# Patient Record
Sex: Female | Born: 1945 | Race: Black or African American | Hispanic: No | Marital: Single | Smoking: Former smoker
Health system: Southern US, Community
[De-identification: ages and names within clinical notes are randomized; demographics above are authoritative.]

## PROBLEM LIST (undated history)

## (undated) DIAGNOSIS — Z9889 Other specified postprocedural states: Secondary | ICD-10-CM

## (undated) DIAGNOSIS — I509 Heart failure, unspecified: Secondary | ICD-10-CM

## (undated) DIAGNOSIS — B1920 Unspecified viral hepatitis C without hepatic coma: Secondary | ICD-10-CM

## (undated) DIAGNOSIS — N189 Chronic kidney disease, unspecified: Secondary | ICD-10-CM

## (undated) DIAGNOSIS — I10 Essential (primary) hypertension: Secondary | ICD-10-CM

## (undated) DIAGNOSIS — E119 Type 2 diabetes mellitus without complications: Secondary | ICD-10-CM

## (undated) DIAGNOSIS — Z9289 Personal history of other medical treatment: Secondary | ICD-10-CM

## (undated) DIAGNOSIS — Z992 Dependence on renal dialysis: Secondary | ICD-10-CM

## (undated) DIAGNOSIS — B20 Human immunodeficiency virus [HIV] disease: Secondary | ICD-10-CM

## (undated) DIAGNOSIS — B59 Pneumocystosis: Secondary | ICD-10-CM

## (undated) DIAGNOSIS — Z8619 Personal history of other infectious and parasitic diseases: Secondary | ICD-10-CM

## (undated) DIAGNOSIS — R112 Nausea with vomiting, unspecified: Secondary | ICD-10-CM

## (undated) DIAGNOSIS — Z973 Presence of spectacles and contact lenses: Secondary | ICD-10-CM

## (undated) DIAGNOSIS — E7800 Pure hypercholesterolemia, unspecified: Secondary | ICD-10-CM

## (undated) DIAGNOSIS — K219 Gastro-esophageal reflux disease without esophagitis: Secondary | ICD-10-CM

## (undated) DIAGNOSIS — H269 Unspecified cataract: Secondary | ICD-10-CM

## (undated) HISTORY — PX: ABDOMINAL HYSTERECTOMY: SHX81

## (undated) HISTORY — DX: Chronic kidney disease, unspecified: N18.9

## (undated) HISTORY — DX: Essential (primary) hypertension: I10

## (undated) HISTORY — DX: Human immunodeficiency virus (HIV) disease: B20

## (undated) HISTORY — PX: CHOLECYSTECTOMY OPEN: SUR202

## (undated) HISTORY — DX: Personal history of other infectious and parasitic diseases: Z86.19

## (undated) HISTORY — DX: Unspecified viral hepatitis C without hepatic coma: B19.20

---

## 1993-12-04 DIAGNOSIS — E119 Type 2 diabetes mellitus without complications: Secondary | ICD-10-CM

## 1996-12-04 DIAGNOSIS — B20 Human immunodeficiency virus [HIV] disease: Secondary | ICD-10-CM

## 1996-12-04 DIAGNOSIS — B59 Pneumocystosis: Secondary | ICD-10-CM

## 1996-12-04 DIAGNOSIS — Z21 Asymptomatic human immunodeficiency virus [HIV] infection status: Secondary | ICD-10-CM

## 1996-12-04 HISTORY — DX: Asymptomatic human immunodeficiency virus (hiv) infection status: Z21

## 1996-12-04 HISTORY — DX: Human immunodeficiency virus (HIV) disease: B20

## 1996-12-04 HISTORY — DX: Pneumocystosis: B59

## 2007-10-26 ENCOUNTER — Inpatient Hospital Stay (HOSPITAL_COMMUNITY): Admission: EM | Admit: 2007-10-26 | Discharge: 2007-11-08 | Payer: Self-pay | Admitting: Emergency Medicine

## 2007-10-26 ENCOUNTER — Ambulatory Visit: Payer: Self-pay | Admitting: Internal Medicine

## 2007-10-28 ENCOUNTER — Encounter (INDEPENDENT_AMBULATORY_CARE_PROVIDER_SITE_OTHER): Payer: Self-pay | Admitting: Internal Medicine

## 2007-11-03 ENCOUNTER — Ambulatory Visit: Payer: Self-pay | Admitting: Vascular Surgery

## 2007-11-04 ENCOUNTER — Encounter: Payer: Self-pay | Admitting: Vascular Surgery

## 2007-11-12 ENCOUNTER — Ambulatory Visit: Payer: Self-pay | Admitting: Infectious Diseases

## 2007-11-12 ENCOUNTER — Encounter: Admission: RE | Admit: 2007-11-12 | Discharge: 2007-11-12 | Payer: Self-pay | Admitting: Infectious Diseases

## 2007-11-12 DIAGNOSIS — B1710 Acute hepatitis C without hepatic coma: Secondary | ICD-10-CM

## 2007-11-12 DIAGNOSIS — B20 Human immunodeficiency virus [HIV] disease: Secondary | ICD-10-CM

## 2007-11-12 DIAGNOSIS — Z940 Kidney transplant status: Secondary | ICD-10-CM

## 2007-11-12 DIAGNOSIS — I10 Essential (primary) hypertension: Secondary | ICD-10-CM

## 2007-11-12 LAB — CONVERTED CEMR LAB
ALT: 31 units/L (ref 0–35)
Alkaline Phosphatase: 151 units/L — ABNORMAL HIGH (ref 39–117)
Basophils Absolute: 0 10*3/uL (ref 0.0–0.1)
Basophils Relative: 1 % (ref 0–1)
Eosinophils Absolute: 0.5 10*3/uL (ref 0.2–0.7)
Eosinophils Relative: 6 % — ABNORMAL HIGH (ref 0–5)
HCT: 36.5 % (ref 36.0–46.0)
HCV Ab: POSITIVE — AB
HCV Quantitative: 3700000 intl units/mL — ABNORMAL HIGH (ref <5)
HIV 1 RNA Quant: <50 copies/mL (ref <50)
Hep B S Ab: POSITIVE — AB
MCHC: 31.5 g/dL (ref 30.0–36.0)
MCV: 94.3 fL (ref 78.0–100.0)
Platelets: 197 10*3/uL (ref 150–400)
RDW: 21.1 % — ABNORMAL HIGH (ref 11.5–15.5)
RPR Ser Ql: REACTIVE — AB
RPR Titer: 1:4 {titer}
Sodium: 143 meq/L (ref 135–145)
Total Bilirubin: 0.4 mg/dL (ref 0.3–1.2)
Total Protein: 7.1 g/dL (ref 6.0–8.3)

## 2007-11-14 ENCOUNTER — Telehealth (INDEPENDENT_AMBULATORY_CARE_PROVIDER_SITE_OTHER): Payer: Self-pay | Admitting: *Deleted

## 2007-11-18 ENCOUNTER — Ambulatory Visit: Payer: Self-pay | Admitting: Surgery

## 2007-11-20 ENCOUNTER — Ambulatory Visit: Payer: Self-pay | Admitting: Internal Medicine

## 2007-11-20 LAB — CONVERTED CEMR LAB: Blood Glucose, Home Monitor: 3 mg/dL

## 2007-12-06 ENCOUNTER — Ambulatory Visit: Payer: Self-pay | Admitting: Surgery

## 2007-12-06 ENCOUNTER — Ambulatory Visit (HOSPITAL_COMMUNITY): Admission: RE | Admit: 2007-12-06 | Discharge: 2007-12-06 | Payer: Self-pay | Admitting: Surgery

## 2007-12-20 ENCOUNTER — Ambulatory Visit (HOSPITAL_COMMUNITY): Admission: RE | Admit: 2007-12-20 | Discharge: 2007-12-20 | Payer: Self-pay | Admitting: Surgery

## 2008-01-17 ENCOUNTER — Encounter: Payer: Self-pay | Admitting: Infectious Diseases

## 2008-02-05 ENCOUNTER — Ambulatory Visit (HOSPITAL_COMMUNITY): Admission: RE | Admit: 2008-02-05 | Discharge: 2008-02-05 | Payer: Self-pay | Admitting: Vascular Surgery

## 2008-02-13 ENCOUNTER — Ambulatory Visit (HOSPITAL_COMMUNITY): Admission: RE | Admit: 2008-02-13 | Discharge: 2008-02-13 | Payer: Self-pay | Admitting: Nephrology

## 2008-03-16 ENCOUNTER — Ambulatory Visit: Payer: Self-pay | Admitting: Surgery

## 2008-04-01 ENCOUNTER — Ambulatory Visit: Payer: Self-pay | Admitting: Surgery

## 2008-04-24 ENCOUNTER — Ambulatory Visit: Payer: Self-pay | Admitting: Surgery

## 2008-04-25 ENCOUNTER — Ambulatory Visit (HOSPITAL_COMMUNITY): Admission: RE | Admit: 2008-04-25 | Discharge: 2008-04-25 | Payer: Self-pay | Admitting: Surgery

## 2008-05-06 ENCOUNTER — Encounter: Admission: RE | Admit: 2008-05-06 | Discharge: 2008-05-06 | Payer: Self-pay | Admitting: Infectious Diseases

## 2008-05-06 ENCOUNTER — Ambulatory Visit: Payer: Self-pay | Admitting: Infectious Diseases

## 2008-05-06 DIAGNOSIS — A539 Syphilis, unspecified: Secondary | ICD-10-CM | POA: Insufficient documentation

## 2008-05-06 LAB — CONVERTED CEMR LAB
AST: 59 units/L — ABNORMAL HIGH (ref 0–37)
Albumin: 4.2 g/dL (ref 3.5–5.2)
BUN: 36 mg/dL — ABNORMAL HIGH (ref 6–23)
Basophils Relative: 1 % (ref 0–1)
CO2: 30 meq/L (ref 19–32)
Calcium: 10.3 mg/dL (ref 8.4–10.5)
Chloride: 95 meq/L — ABNORMAL LOW (ref 96–112)
Cholesterol: 181 mg/dL (ref 0–200)
Creatinine, Ser: 8.11 mg/dL — ABNORMAL HIGH (ref 0.40–1.20)
Glucose, Bld: 60 mg/dL — ABNORMAL LOW (ref 70–99)
HDL: 70 mg/dL (ref >39)
HIV 1 RNA Quant: <50 copies/mL (ref <50)
HIV-1 RNA Quant, Log: <1.7 (ref <1.70)
Hemoglobin: 11.9 g/dL — ABNORMAL LOW (ref 12.0–15.0)
Lymphs Abs: 1.9 10*3/uL (ref 0.7–4.0)
MCHC: 32.3 g/dL (ref 30.0–36.0)
Monocytes Absolute: 0.8 10*3/uL (ref 0.1–1.0)
Monocytes Relative: 11 % (ref 3–12)
Neutro Abs: 4.5 10*3/uL (ref 1.7–7.7)
Potassium: 4.8 meq/L (ref 3.5–5.3)
RBC: 3.66 M/uL — ABNORMAL LOW (ref 3.87–5.11)
Total CHOL/HDL Ratio: 2.6
Triglycerides: 123 mg/dL (ref <150)
WBC: 7.6 10*3/uL (ref 4.0–10.5)

## 2008-05-08 ENCOUNTER — Ambulatory Visit: Payer: Self-pay | Admitting: Infectious Diseases

## 2008-05-08 ENCOUNTER — Encounter (INDEPENDENT_AMBULATORY_CARE_PROVIDER_SITE_OTHER): Payer: Self-pay | Admitting: *Deleted

## 2008-05-08 ENCOUNTER — Encounter: Payer: Self-pay | Admitting: Infectious Diseases

## 2008-05-15 ENCOUNTER — Ambulatory Visit (HOSPITAL_COMMUNITY): Admission: RE | Admit: 2008-05-15 | Discharge: 2008-05-15 | Payer: Self-pay | Admitting: Infectious Diseases

## 2008-05-18 ENCOUNTER — Encounter: Payer: Self-pay | Admitting: Infectious Diseases

## 2008-06-16 ENCOUNTER — Ambulatory Visit: Payer: Self-pay | Admitting: Infectious Diseases

## 2008-07-20 ENCOUNTER — Ambulatory Visit (HOSPITAL_COMMUNITY): Admission: RE | Admit: 2008-07-20 | Discharge: 2008-07-20 | Payer: Self-pay | Admitting: Nephrology

## 2008-08-28 ENCOUNTER — Ambulatory Visit: Payer: Self-pay | Admitting: Infectious Diseases

## 2008-08-28 LAB — CONVERTED CEMR LAB
ALT: 56 units/L — ABNORMAL HIGH (ref 0–35)
Basophils Absolute: 0.1 10*3/uL (ref 0.0–0.1)
CO2: 27 meq/L (ref 19–32)
Calcium: 10.3 mg/dL (ref 8.4–10.5)
Chloride: 94 meq/L — ABNORMAL LOW (ref 96–112)
Cholesterol: 182 mg/dL (ref 0–200)
Creatinine, Ser: 7.31 mg/dL — ABNORMAL HIGH (ref 0.40–1.20)
Eosinophils Relative: 5 % (ref 0–5)
Glucose, Bld: 95 mg/dL (ref 70–99)
HCT: 45.5 % (ref 36.0–46.0)
HIV-1 RNA Quant, Log: <1.7 (ref <1.70)
Hemoglobin: 14.6 g/dL (ref 12.0–15.0)
Lymphocytes Relative: 28 % (ref 12–46)
Lymphs Abs: 1.9 10*3/uL (ref 0.7–4.0)
Monocytes Absolute: 0.7 10*3/uL (ref 0.1–1.0)
Neutro Abs: 3.9 10*3/uL (ref 1.7–7.7)
RBC: 4.51 M/uL (ref 3.87–5.11)
Total Bilirubin: 0.8 mg/dL (ref 0.3–1.2)
Total Protein: 8.1 g/dL (ref 6.0–8.3)
Triglycerides: 97 mg/dL (ref <150)
VLDL: 19 mg/dL (ref 0–40)
WBC: 6.9 10*3/uL (ref 4.0–10.5)

## 2008-09-15 ENCOUNTER — Ambulatory Visit: Payer: Self-pay | Admitting: Infectious Diseases

## 2008-09-15 LAB — CONVERTED CEMR LAB: RPR Ser Ql: REACTIVE — AB

## 2008-09-28 ENCOUNTER — Encounter: Payer: Self-pay | Admitting: Infectious Diseases

## 2008-12-16 ENCOUNTER — Ambulatory Visit (HOSPITAL_COMMUNITY): Admission: RE | Admit: 2008-12-16 | Discharge: 2008-12-16 | Payer: Self-pay | Admitting: Nephrology

## 2008-12-22 ENCOUNTER — Encounter: Payer: Self-pay | Admitting: Infectious Diseases

## 2008-12-29 ENCOUNTER — Ambulatory Visit: Payer: Self-pay | Admitting: Infectious Diseases

## 2008-12-29 LAB — CONVERTED CEMR LAB
ALT: 25 units/L (ref 0–35)
Albumin: 3.7 g/dL (ref 3.5–5.2)
Basophils Absolute: 0 10*3/uL (ref 0.0–0.1)
CO2: 23 meq/L (ref 19–32)
Calcium: 9.9 mg/dL (ref 8.4–10.5)
Chlamydia, Swab/Urine, PCR: NEGATIVE
Chloride: 99 meq/L (ref 96–112)
GC Probe Amp, Urine: NEGATIVE
Glucose, Bld: 104 mg/dL — ABNORMAL HIGH (ref 70–99)
HIV 1 RNA Quant: 88 copies/mL — ABNORMAL HIGH (ref <48)
Lymphocytes Relative: 20 % (ref 12–46)
Lymphs Abs: 1.6 10*3/uL (ref 0.7–4.0)
Neutro Abs: 5 10*3/uL (ref 1.7–7.7)
Neutrophils Relative %: 62 % (ref 43–77)
Platelets: 235 10*3/uL (ref 150–400)
Potassium: 5.2 meq/L (ref 3.5–5.3)
RDW: 14 % (ref 11.5–15.5)
Sodium: 140 meq/L (ref 135–145)
Total Bilirubin: 0.5 mg/dL (ref 0.3–1.2)
Total Protein: 7.4 g/dL (ref 6.0–8.3)
WBC: 8.1 10*3/uL (ref 4.0–10.5)

## 2008-12-30 ENCOUNTER — Ambulatory Visit (HOSPITAL_COMMUNITY): Admission: RE | Admit: 2008-12-30 | Discharge: 2008-12-30 | Payer: Self-pay | Admitting: Nephrology

## 2008-12-30 ENCOUNTER — Encounter (INDEPENDENT_AMBULATORY_CARE_PROVIDER_SITE_OTHER): Payer: Self-pay | Admitting: Diagnostic Radiology

## 2009-01-11 ENCOUNTER — Encounter (INDEPENDENT_AMBULATORY_CARE_PROVIDER_SITE_OTHER): Payer: Self-pay | Admitting: *Deleted

## 2009-01-22 ENCOUNTER — Encounter: Payer: Self-pay | Admitting: Infectious Diseases

## 2009-02-04 ENCOUNTER — Telehealth: Payer: Self-pay

## 2009-03-05 ENCOUNTER — Encounter: Admission: RE | Admit: 2009-03-05 | Discharge: 2009-03-05 | Payer: Self-pay | Admitting: Nephrology

## 2009-03-09 ENCOUNTER — Ambulatory Visit: Payer: Self-pay | Admitting: Infectious Diseases

## 2009-03-09 LAB — CONVERTED CEMR LAB: HIV-1 RNA Quant, Log: <1.68 (ref <1.68)

## 2009-04-12 ENCOUNTER — Ambulatory Visit (HOSPITAL_COMMUNITY): Admission: RE | Admit: 2009-04-12 | Discharge: 2009-04-12 | Payer: Self-pay | Admitting: Nephrology

## 2009-04-26 ENCOUNTER — Ambulatory Visit: Payer: Self-pay | Admitting: Infectious Diseases

## 2009-04-26 LAB — CONVERTED CEMR LAB
ALT: 54 units/L — ABNORMAL HIGH (ref 0–35)
Basophils Absolute: 0 10*3/uL (ref 0.0–0.1)
Basophils Relative: 1 % (ref 0–1)
CO2: 24 meq/L (ref 19–32)
Calcium: 9.8 mg/dL (ref 8.4–10.5)
Chloride: 98 meq/L (ref 96–112)
Creatinine, Ser: 8.35 mg/dL — ABNORMAL HIGH (ref 0.40–1.20)
GFR calc Af Amer: 6 mL/min — ABNORMAL LOW (ref >60)
GFR calc non Af Amer: 5 mL/min — ABNORMAL LOW (ref >60)
Glucose, Bld: 104 mg/dL — ABNORMAL HIGH (ref 70–99)
HIV 1 RNA Quant: <48 copies/mL (ref <48)
MCHC: 34.1 g/dL (ref 30.0–36.0)
Neutro Abs: 3.5 10*3/uL (ref 1.7–7.7)
Neutrophils Relative %: 62 % (ref 43–77)
RBC: 3.78 M/uL — ABNORMAL LOW (ref 3.87–5.11)
RDW: 14.9 % (ref 11.5–15.5)

## 2009-05-11 ENCOUNTER — Ambulatory Visit: Payer: Self-pay | Admitting: Infectious Diseases

## 2009-05-12 ENCOUNTER — Encounter: Payer: Self-pay | Admitting: Infectious Diseases

## 2009-06-09 ENCOUNTER — Ambulatory Visit (HOSPITAL_COMMUNITY): Admission: RE | Admit: 2009-06-09 | Discharge: 2009-06-09 | Payer: Self-pay | Admitting: Infectious Diseases

## 2009-09-27 ENCOUNTER — Emergency Department (HOSPITAL_COMMUNITY): Admission: EM | Admit: 2009-09-27 | Discharge: 2009-09-27 | Payer: Self-pay | Admitting: Emergency Medicine

## 2009-10-05 ENCOUNTER — Ambulatory Visit: Payer: Self-pay | Admitting: Infectious Diseases

## 2009-10-05 LAB — CONVERTED CEMR LAB
AST: 43 units/L — ABNORMAL HIGH (ref 0–37)
Alkaline Phosphatase: 169 units/L — ABNORMAL HIGH (ref 39–117)
BUN: 29 mg/dL — ABNORMAL HIGH (ref 6–23)
Basophils Absolute: 0 10*3/uL (ref 0.0–0.1)
Basophils Relative: 0 % (ref 0–1)
Creatinine, Ser: 5.09 mg/dL — ABNORMAL HIGH (ref 0.40–1.20)
Eosinophils Relative: 4 % (ref 0–5)
HCT: 36.3 % (ref 36.0–46.0)
HIV-1 RNA Quant, Log: <1.68 (ref <1.68)
Hemoglobin: 12 g/dL (ref 12.0–15.0)
Lymphocytes Relative: 17 % (ref 12–46)
MCHC: 33.1 g/dL (ref 30.0–36.0)
Monocytes Absolute: 0.5 10*3/uL (ref 0.1–1.0)
RDW: 15.1 % (ref 11.5–15.5)
Total Bilirubin: 0.6 mg/dL (ref 0.3–1.2)

## 2009-10-19 ENCOUNTER — Ambulatory Visit: Payer: Self-pay | Admitting: Infectious Diseases

## 2009-10-25 ENCOUNTER — Encounter: Payer: Self-pay | Admitting: Infectious Diseases

## 2009-12-17 ENCOUNTER — Encounter (INDEPENDENT_AMBULATORY_CARE_PROVIDER_SITE_OTHER): Payer: Self-pay | Admitting: *Deleted

## 2009-12-20 ENCOUNTER — Ambulatory Visit: Payer: Self-pay | Admitting: Infectious Diseases

## 2009-12-20 LAB — CONVERTED CEMR LAB
Albumin: 4.1 g/dL (ref 3.5–5.2)
Alkaline Phosphatase: 117 units/L (ref 39–117)
BUN: 51 mg/dL — ABNORMAL HIGH (ref 6–23)
Basophils Absolute: 0 10*3/uL (ref 0.0–0.1)
CO2: 35 meq/L — ABNORMAL HIGH (ref 19–32)
Calcium: 10.2 mg/dL (ref 8.4–10.5)
Chloride: 92 meq/L — ABNORMAL LOW (ref 96–112)
Eosinophils Relative: 8 % — ABNORMAL HIGH (ref 0–5)
Glucose, Bld: 85 mg/dL (ref 70–99)
HCT: 28.6 % — ABNORMAL LOW (ref 36.0–46.0)
Hemoglobin: 9.6 g/dL — ABNORMAL LOW (ref 12.0–15.0)
Lymphocytes Relative: 27 % (ref 12–46)
Monocytes Absolute: 0.6 10*3/uL (ref 0.1–1.0)
Monocytes Relative: 9 % (ref 3–12)
Potassium: 4.9 meq/L (ref 3.5–5.3)
RDW: 12.8 % (ref 11.5–15.5)
RPR Ser Ql: REACTIVE — AB
RPR Titer: 1:4 {titer}
T pallidum Antibodies (TP-PA): >70 — ABNORMAL HIGH (ref <1.0)

## 2010-01-04 ENCOUNTER — Ambulatory Visit: Payer: Self-pay | Admitting: Infectious Diseases

## 2010-01-26 ENCOUNTER — Ambulatory Visit (HOSPITAL_COMMUNITY): Admission: RE | Admit: 2010-01-26 | Discharge: 2010-01-26 | Payer: Self-pay | Admitting: Nephrology

## 2010-01-28 ENCOUNTER — Ambulatory Visit (HOSPITAL_COMMUNITY): Admission: RE | Admit: 2010-01-28 | Discharge: 2010-01-28 | Payer: Self-pay | Admitting: Nephrology

## 2010-02-10 ENCOUNTER — Ambulatory Visit: Payer: Self-pay | Admitting: Vascular Surgery

## 2010-02-10 ENCOUNTER — Ambulatory Visit (HOSPITAL_COMMUNITY): Admission: RE | Admit: 2010-02-10 | Discharge: 2010-02-10 | Payer: Self-pay | Admitting: Vascular Surgery

## 2010-02-14 ENCOUNTER — Ambulatory Visit: Payer: Self-pay | Admitting: Surgery

## 2010-03-29 ENCOUNTER — Encounter: Payer: Self-pay | Admitting: Infectious Diseases

## 2010-04-04 ENCOUNTER — Ambulatory Visit: Payer: Self-pay | Admitting: Infectious Diseases

## 2010-04-04 LAB — CONVERTED CEMR LAB
ALT: 40 units/L — ABNORMAL HIGH (ref 0–35)
AST: 50 units/L — ABNORMAL HIGH (ref 0–37)
BUN: 76 mg/dL — ABNORMAL HIGH (ref 6–23)
Basophils Relative: 0 % (ref 0–1)
CO2: 23 meq/L (ref 19–32)
Creatinine, Ser: 10.73 mg/dL — ABNORMAL HIGH (ref 0.40–1.20)
Eosinophils Absolute: 0.4 10*3/uL (ref 0.0–0.7)
Eosinophils Relative: 6 % — ABNORMAL HIGH (ref 0–5)
HIV 1 RNA Quant: <48 copies/mL (ref <48)
MCHC: 31.4 g/dL (ref 30.0–36.0)
MCV: 98.2 fL (ref 78.0–100.0)
Neutrophils Relative %: 62 % (ref 43–77)
Platelets: 172 10*3/uL (ref 150–400)
Total Bilirubin: 0.6 mg/dL (ref 0.3–1.2)

## 2010-04-05 ENCOUNTER — Telehealth: Payer: Self-pay | Admitting: Infectious Diseases

## 2010-04-20 ENCOUNTER — Ambulatory Visit: Payer: Self-pay | Admitting: Infectious Diseases

## 2010-06-24 ENCOUNTER — Ambulatory Visit (HOSPITAL_COMMUNITY): Admission: RE | Admit: 2010-06-24 | Discharge: 2010-06-24 | Payer: Self-pay | Admitting: Nephrology

## 2010-07-13 ENCOUNTER — Ambulatory Visit (HOSPITAL_COMMUNITY): Admission: RE | Admit: 2010-07-13 | Discharge: 2010-07-13 | Payer: Self-pay | Admitting: Obstetrics and Gynecology

## 2010-09-05 ENCOUNTER — Ambulatory Visit: Payer: Self-pay | Admitting: Infectious Disease

## 2010-09-05 LAB — CONVERTED CEMR LAB
AST: 36 units/L (ref 0–37)
Albumin: 3.7 g/dL (ref 3.5–5.2)
BUN: 68 mg/dL — ABNORMAL HIGH (ref 6–23)
Basophils Relative: 0 % (ref 0–1)
CO2: 27 meq/L (ref 19–32)
Calcium: 10.1 mg/dL (ref 8.4–10.5)
Chloride: 100 meq/L (ref 96–112)
Creatinine, Ser: 9.05 mg/dL — ABNORMAL HIGH (ref 0.40–1.20)
Glucose, Bld: 99 mg/dL (ref 70–99)
HIV 1 RNA Quant: <20 copies/mL (ref <20)
Hemoglobin: 9.2 g/dL — ABNORMAL LOW (ref 12.0–15.0)
Lymphs Abs: 1.4 10*3/uL (ref 0.7–4.0)
MCHC: 32.6 g/dL (ref 30.0–36.0)
Monocytes Absolute: 0.5 10*3/uL (ref 0.1–1.0)
Monocytes Relative: 9 % (ref 3–12)
Neutro Abs: 3.6 10*3/uL (ref 1.7–7.7)
Neutrophils Relative %: 60 % (ref 43–77)
Potassium: 4.8 meq/L (ref 3.5–5.3)
RBC: 2.93 M/uL — ABNORMAL LOW (ref 3.87–5.11)
RPR Ser Ql: REACTIVE — AB
RPR Titer: 1:4 {titer}
T pallidum Antibodies (TP-PA): >8 — ABNORMAL HIGH (ref <0.90)
WBC: 6 10*3/uL (ref 4.0–10.5)

## 2010-09-21 ENCOUNTER — Ambulatory Visit: Payer: Self-pay | Admitting: Infectious Disease

## 2010-09-21 DIAGNOSIS — Z90710 Acquired absence of both cervix and uterus: Secondary | ICD-10-CM

## 2010-09-21 LAB — CONVERTED CEMR LAB: LDL Goal: 100 mg/dL

## 2011-01-03 NOTE — Progress Notes (Signed)
Summary: critical labs for creatnine/TY  Phone Note From Other Clinic   Caller: Joaquim Lai from [employer] of Call: [facility] lab called with an alert on this patients creatinine of 10.73. Dr. Ola Spurr is aware. The patient is currently on dialysis therapy. Initial call taken by: Jarrett Ables CMA,  Apr 05, 2010 10:07 AM

## 2011-01-03 NOTE — Miscellaneous (Signed)
Summary: RW Update  Clinical Lists Changes  Observations: Added new observation of HIV RISK BEH: Heterosexual contact (12/17/2009 14:51)

## 2011-01-03 NOTE — Assessment & Plan Note (Signed)
Summary: 89MONTH F/U/VS   Primary Provider:  Adrian Prows MD  CC:  3 month check up.  History of Present Illness: Follow up for HIV -  Taking meds without any issue - 100% compliance. Doing great at HD. No complaints   PRIOR Followed up at [facility] for consideration of changing regimen since her CD4 ct is still not responded (thinks it was Dr Ralene Ok)  I reveiwed her records from [facility] and they woudl rec change from D4t to lamivudine.  When I saw in June we decided to change her after she came back from her trip to [place] in August.  She actually never started the lamivudine since she felt like she was too busy.  Going to HD still- Dr Archie Balboa is her nephrologist.    He d/ced lisinopril and amlodpine Following at [facility] for possible renal txp.  Had livier bxp done at [facility] and told there was a" little scarring".   Feeling well overall.  Graft doing ok at HD. No fevers chills NS, CP, sob, abd pain, NVD or other concenrnig symptoms Sugars at HD runnign well   Preventive Screening-Counseling & Management  Alcohol-Tobacco     Alcohol drinks/day: 0     Smoking Status: quit     Year Quit: 2000  Caffeine-Diet-Exercise     Caffeine use/day: sodas     Does Patient Exercise: yes     Type of exercise: walking     Exercise (avg: min/session): 30-60     Times/week: 3  Safety-Violence-Falls     Seat Belt Use: yes   Updated Prior Medication List: ZIAGEN 300 MG TABS (ABACAVIR SULFATE) Take  tablet two times a day SUSTIVA 600 MG TABS (EFAVIRENZ) Take 1 tablet by mouth once a day CRESTOR 20 MG TABS (ROSUVASTATIN CALCIUM) 1 tablet by mouth daily ASPIRIN 81 MG  TBEC (ASPIRIN) one by mouth every day PHOSLO 667 MG  CAPS (CALCIUM ACETATE (PHOS BINDER)) take 3 capsules with meals and take 2 capsules with snacks EPIVIR 10 MG/ML SOLN (LAMIVUDINE) take 2.5 ml  (25 mg) once daily LISINOPRIL 40 MG TABS (LISINOPRIL) one by mouth once daily on non hd days OMEPRAZOLE 20 MG CPDR (OMEPRAZOLE) Take one  capsule by mouth at bedtime.  Current Allergies (reviewed today): No known allergies  Past History:  Past Medical History: Last updated: 06/16/2008 1. HIV - dxed 1998 - was admitted with PCP PNA.   Virus had been undetectable but has been off on meds due to "tired of them" Her last VL was in 9/08 and does not remember but thinks it was undetectable.  CD4 at that time was "low" per pt - 150 or so.   Has tried multiple regimens but has had intolerance issues with N/V so has been Ziagen, Zerit and the Sustiva for several years.  Does tolerate these well but does get tired at times of taking so many pills  2. OIs- PCP in 1998 3. STDs - syphilis 1968.  Treated in past but apparently RPR still positive  4.  ESRD -started HD in Nov 08.  Due to DM. 5. DM - was on lantus but since losing 70 #s now only on prandin with A1C of 5.0 6. HTN -  7. Gallbladder surgery 8.  TAH - for fibroid tumors 9. 3 vaginal deliveries 10. Anemia  11.  Hep C - no prior treatment.  No prior biopsy.    Family History: Last updated: 11/12/2007 Mother heart failure Father cancer, DM  Social History: Last updated:  05/06/2008 Lives alone now but daughter here in [place].  Tob none, drink - none now but prior heavy weekend drinker.  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  No other drugs.  Prior cocaine and marijuana.    Risk Factors: Alcohol Use: 0 (04/20/2010) Caffeine Use: sodas (04/20/2010) Exercise: yes (04/20/2010)  Risk Factors: Smoking Status: quit (04/20/2010)  Review of Systems       11 systems reviewed and negative except per HPI   Vital Signs:  Patient profile:   65 year old female Height:      63 inches (160.02 cm) Weight:      133.0 pounds (60.45 kg) BMI:     23.65 Temp:     97.9 degrees F (36.61 degrees C) oral Pulse rate:   81 / minute BP sitting:   180 / 80  (left arm)  Vitals Entered By: Rocky Morel) (Apr 20, 2010 9:55 AM) CC: 3 month check up Pain  Assessment Patient in pain? no      Nutritional Status BMI of 19 -24 = normal Nutritional Status Detail appetite is great per patient  Have you ever been in a relationship where you felt threatened, hurt or afraid?No   Does patient need assistance? Functional Status Self care Ambulation Normal   Physical Exam  General:  alert and well-developed.   Head:  normocephalic and atraumatic.   Eyes:  vision grossly intact and pupils equal.   Mouth:  fair dentition.   Neck:  supple.   Lungs:  normal respiratory effort, no accessory muscle use, and normal breath sounds.   Heart:  normal rate and regular rhythm.   Abdomen:  soft, non-tender, and normal bowel sounds.   Msk:  normal ROM, no joint tenderness, and no joint swelling.   Extremities:  no cce Neurologic:  alert & oriented X3, cranial nerves II-XII intact, and strength normal in all extremities.   Skin:  no rashes.   Cervical Nodes:  no anterior cervical adenopathy and no posterior cervical adenopathy.   Psych:  Oriented X3 and memory intact for recent and remote.          Medication Adherence: 04/20/2010   Adherence to medications reviewed with patient. Counseling to provide adequate adherence provided   Prevention For Positives: 04/20/2010   Safe sex practices discussed with patient. Condoms offered.                             Impression & Recommendations:  Problem # 1:  HIV INFECTION (ICD-042)  Doing great after change of d4t to lamivudine per [facility].   Repeat VL <48 but CD 4 not really increased despite this change. Records reveiwed from [facility] on sustiva, abacavir and epivir, F/u q 6 months  Diagnostics Reviewed:  HIV: CDC-defined AIDS (09/15/2008)   CD4: 190 (04/04/2010)   WBC: 6.6 (04/04/2010)   Hgb: 12.1 (04/04/2010)   HCT: 38.5 (04/04/2010)   Platelets: 172 (04/04/2010) HIV-1 RNA: <48 copies/mL (04/04/2010)   HBSAg: NEG (11/12/2007)  Orders: Est. Patient Level IV (99214)Future  Orders: T-CD4SP ([facility]) (CD4SP) ... 10/17/2010 T-HIV Viral Load [phone]) ... 10/17/2010 T-CBC w/Diff [id number]) ... 10/17/2010 T-Comprehensive Metabolic Panel ([id number]) ... 10/17/2010 T-RPR (Syphilis) [phone]) ... 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  Stable never treated Follows at [facility] intermittently.   Problem # 5:  PREVENTIVE HEALTH CARE (ICD-V70.0) Up to date 05/08/2008 - no further pap smears needed due to total hysterectomy  Problem # 6:  DM (ICD-250.00) Diet controlled.  Feet examined.  SHe had a corn removed recently but otherwise doing well. Her updated medication list for this problem includes:    Aspirin 81 Mg Tbec (Aspirin) ..... One by mouth every day    Lisinopril 40 Mg Tabs (Lisinopril) ..... One by mouth once daily on non hd days  Labs Reviewed: Creat: 10.73 (04/04/2010)    Reviewed HgBA1c results: 5.0 (05/06/2008)  Medications Added to Medication List This Visit: 1)  Omeprazole 20 Mg Cpdr (Omeprazole) .... Take one capsule by mouth at bedtime.  Patient Instructions: 1)  Follow up 6 months with Brad. 2)  Call for new or concerning symptoms 3)  Be sure to return for lab work one (1) week before your next appointment as scheduled.

## 2011-01-03 NOTE — Miscellaneous (Signed)
  Clinical Lists Changes  Medications: Changed medication from EPIVIR 10 MG/ML SOLN (LAMIVUDINE) half a  teaspoons full (25 mg) daily - first day take one tablespoon (15 ml or 150 mg) to EPIVIR 10 MG/ML SOLN (LAMIVUDINE) take 2.5 ml  (25 mg) once daily - Signed Rx of EPIVIR 10 MG/ML SOLN (LAMIVUDINE) take 2.5 ml  (25 mg) once daily;  #1 x 11;  Signed;  Entered by: Adrian Prows MD;  Authorized by: Adrian Prows MD;  Method used: Electronically to [facility] [phone]*, [address], [place], [place], [place]  [address], Ph: [phone] or [phone], Fax: [phone]    Prescriptions: EPIVIR 10 MG/ML SOLN (LAMIVUDINE) take 2.5 ml  (25 mg) once daily  #1 x 11   Entered and Authorized by:   Adrian Prows MD   Signed by:   Adrian Prows MD on 03/29/2010   Method used:   Electronically to        [facility]. [phone]* (retail)       [address]       [place], [place]  [address]       Ph: [phone] or [phone]       Fax: [phone]   RxID:   [id number]

## 2011-01-03 NOTE — Assessment & Plan Note (Signed)
Summary: 6 MONTH F/U [MKJ]]   Visit Type:  Follow-up Primary Dazha Kempa:  [facility]  CC:  6 month f/u c/o boils kidney doctor placed her on doxcycycline, Lipid Management, and Hypertension Management.  History of Present Illness: 65 year old AA lady with HIV, AIDS, dx in 1990s who has been perfectly suppressed for years now. Her VL was undetectable, cd4 count at 180. She has trouble reconstuting above upper 100s low 200s despite changing around her ARV regimen including trip to [place] for 2nd opinion. It turns out she has been taking ther epivir at half dose she was supposed to be taking 12.5 rather than 25mg  per day. I have instructed her on the correct dose and she will redose with 50mg  x1 then 25mg  daily, continuing her abacavir and sustiva. 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 She does not want to make futher changes to her regimen at present. She has a new corn on her foot that has been bothering her and she asks for recommendation with re to further therapy and interventions. She also requests referral to ob gyn. She has had a hysterectomy and unilatetral oopherectomy it appears. Her BP have been labile at HD, high at times then low post HD. I spent greater than 45 miinutes with this pt including greater than 50% of time face to face counselling the pt.  Hypertension History:      Positive major cardiovascular risk factors include female age 30 years old or older, diabetes, and hypertension.  Negative major cardiovascular risk factors include negative family history for ischemic heart disease and non-tobacco-user status.        Positive history for target organ damage include renal insufficiency.  Further assessment for target organ damage reveals no history of ASHD, stroke/TIA, or peripheral vascular disease.    Lipid  Management History:      Positive NCEP/ATP III risk factors include female age 20 years old or older, early menopause without estrogen hormone replacement, diabetes, and hypertension.  Negative NCEP/ATP III risk factors include HDL cholesterol greater than 60, no family history for ischemic heart disease, non-tobacco-user status, no ASHD (atherosclerotic heart disease), no prior stroke/TIA, no peripheral vascular disease, and no history of aortic aneurysm.    Problems Prior to Update: 1)  Preventive Health Care  (ICD-V70.0) 2)  Screening For Malignant Neoplasm of The Cervix  (ICD-V76.2) 3)  Unspecified Syphilis  (ICD-097.9) 4)  Preventive Health Care  (ICD-V70.0) 5)  End Stage Renal Disease  (ICD-585.6) 6)  Essential Hypertension, Benign  (ICD-401.1) 7)  Dm  (ICD-250.00) 8)  Hepatitis C  (ICD-070.51) 9)  HIV Infection  (ICD-042)  Medications Prior to Update: 1)  Ziagen 300 Mg Tabs (Abacavir Sulfate) .... Take  Tablet Two Times A Day 2)  Sustiva 600 Mg Tabs (Efavirenz) .... Take 1 Tablet By Mouth Once A Day 3)  Crestor 20 Mg Tabs (Rosuvastatin Calcium) .Marland Kitchen.. 1 Tablet By Mouth Daily 4)  Aspirin 81 Mg  Tbec (Aspirin) .... One By Mouth Every Day 5)  Phoslo 667 Mg  Caps (Calcium Acetate (Phos Binder)) .... Take 3 Capsules With Meals and Take 2 Capsules With Snacks 6)  Epivir 10 Mg/ml Soln (Lamivudine) .... Take 2.5 Ml  (25 Mg) Once Daily 7)  Lisinopril 40 Mg Tabs (Lisinopril) .... One By Mouth Once Daily On Non Hd Days 8)  Omeprazole  20 Mg Cpdr (Omeprazole) .... Take One Capsule By Mouth At Bedtime.  Current Medications (verified): 1)  Ziagen 300 Mg Tabs (Abacavir Sulfate) .... Take  Tablet Two Times A Day 2)  Sustiva 600 Mg Tabs (Efavirenz) .... Take 1 Tablet By Mouth Once A Day 3)  Crestor 20 Mg Tabs (Rosuvastatin Calcium) .Marland Kitchen.. 1 Tablet By Mouth Daily 4)  Aspirin 81 Mg  Tbec (Aspirin) .... One By Mouth Every Day 5)  Phoslo 667 Mg  Caps (Calcium Acetate (Phos Binder)) .... Take 4 Capsules  With Meals and Take 2 Capsules With Snacks 6)  Epivir 10 Mg/ml Soln (Lamivudine) .... Take 2.5 Ml  (25 Mg) Once Daily 7)  Lisinopril 40 Mg Tabs (Lisinopril) .... One By Mouth Once Daily On Non Hd Days 8)  Omeprazole 20 Mg Cpdr (Omeprazole) .... Take One Capsule By Mouth At Bedtime. 9)  Doxycycline Hyclate 100 Mg Caps (Doxycycline Hyclate) .Marland Kitchen.. 1 Capsule Two Times A Day  Allergies (verified): No Known Drug Allergies      Current Allergies (reviewed today): No known allergies  Past History:  Past Medical History: Last updated: 06/16/2008 1. HIV - dxed 1998 - was admitted with PCP PNA.   Virus had been undetectable but has been off on meds due to "tired of them" Her last VL was in 9/08 and does not remember but thinks it was undetectable.  CD4 at that time was "low" per pt - 150 or so.   Has tried multiple regimens but has had intolerance issues with N/V so has been Ziagen, Zerit and the Sustiva for several years.  Does tolerate these well but does get tired at times of taking so many pills  2. OIs- PCP in 1998 3. STDs - syphilis 1968.  Treated in past but apparently RPR still positive  4.  ESRD -started HD in Nov 08.  Due to DM. 5. DM - was on lantus but since losing 70 #s now only on prandin with A1C of 5.0 6. HTN -  7. Gallbladder surgery 8.  TAH - for fibroid tumors 9. 3 vaginal deliveries 10. Anemia  11.  Hep C - no prior treatment.  No prior biopsy.    Family History: Last updated: 11/12/2007 Mother heart failure Father cancer, DM  Social History: Last updated: 05/06/2008 Lives alone now but daughter here in [place].  Tob none, drink - none now but prior heavy weekend drinker.  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  No other drugs.  Prior cocaine and marijuana.    Risk Factors: Alcohol Use: 0 (04/20/2010) Caffeine Use: sodas (04/20/2010) Exercise: yes (04/20/2010)  Risk Factors: Smoking Status: quit (04/20/2010)  Review of Systems  The patient  denies anorexia, fever, weight loss, weight gain, vision loss, decreased hearing, hoarseness, chest pain, syncope, dyspnea on exertion, peripheral edema, prolonged cough, headaches, hemoptysis, abdominal pain, melena, hematochezia, severe indigestion/heartburn, hematuria, incontinence, genital sores, muscle weakness, suspicious skin lesions, transient blindness, difficulty walking, depression, unusual weight change, abnormal bleeding, and enlarged lymph nodes.    Vital Signs:  Patient profile:   65 year old female Height:      63 inches (160.02 cm) Weight:      133.50 pounds (60.68 kg) BMI:     23.73 Temp:     98.2 degrees F (36.78 degrees C) oral Pulse rate:   96 / minute BP sitting:   149 / 72  (left arm)  Vitals Entered By: Jarrett Ables CMA (September 21, 2010 9:53 AM)  CC: 6 month f/u c/o boils kidney doctor placed her on doxcycycline, Lipid Management, Hypertension Management Pain Assessment Patient in pain? no      Nutritional Status BMI of 19 -24 = normal Nutritional Status Detail nl  Does patient need assistance? Functional Status Self care Ambulation Normal        Medication Adherence: 09/21/2010   Adherence to medications reviewed with patient. Counseling to provide adequate adherence provided   Prevention For Positives: 09/21/2010   Safe sex practices discussed with patient. Condoms offered.   Education Materials Provided: 09/21/2010 Safe sex practices discussed with patient. Condoms offered.                          Physical Exam  General:  alert and well-developed.   Head:  normocephalic and atraumatic.   Eyes:  vision grossly intact and pupils equal.   Ears:  R ear normal and L ear normal.   Nose:  no external deformity and no nasal discharge.   Mouth:  fair dentition.  pharynx pink and moist and no erythema.   Neck:  supple.   Lungs:  normal respiratory effort, no accessory muscle use, and normal breath sounds.   Heart:  normal rate and regular  rhythm.   Abdomen:  soft, non-tender, and normal bowel sounds.   Neurologic:  alert & oriented X3,d strength normal in all extremities.   Skin:  no rashes.   Psych:  Oriented X3 and memory intact for recent and remote.     Impression & Recommendations:  Problem # 1:  HIV INFECTION (ICD-042) suppressed continue current regimen. With time her cd4 count may drop further with age and in that case would be inclined to put her back on PCP prophylaxis with for example 3 x weekly bactrim Diagnostics Reviewed:  HIV: CDC-defined AIDS (09/15/2008)   CD4: 180 (09/06/2010)   WBC: 6.0 (09/05/2010)   Hgb: 9.2 (09/05/2010)   HCT: 28.2 (09/05/2010)   Platelets: 183 (09/05/2010) HIV-1 RNA: <48 copies/mL (04/04/2010)   HBSAg: NEG (11/12/2007)  Her updated medication list for this problem includes:    Doxycycline Hyclate 100 Mg Caps (Doxycycline hyclate) .Marland Kitchen... 1 capsule two times a day  Orders: T-GC Probe, urine [id number]) Est. Patient Level V (99215)Future Orders: T-CD4SP ([facility]) (CD4SP) ... 01/04/2011 T-HIV Viral Load [id number]) ... 01/04/2011 T-CBC w/Diff [id number]) ... 01/04/2011 T-Lipid Profile [phone]) ... 01/04/2011 T-RPR (Syphilis) [phone]) ... 01/04/2011 T-Comprehensive Metabolic Panel ([id number]) ... 01/04/2011  Problem # 2:  ESSENTIAL HYPERTENSION, BENIGN (ICD-401.1)  decent control see remarks with re to HD Her updated medication list for this problem includes:    Lisinopril 40 Mg Tabs (Lisinopril) ..... One by mouth once daily on non hd days  Orders: Est. Patient Level V [id number])  Problem # 3:  DM (ICD-250.00)  Should check a1c, get opthoexam. Referring to podiatry already. Diet controlled? Her updated medication list for this problem includes:    Aspirin 81 Mg Tbec (Aspirin) ..... One by mouth every day    Lisinopril 40 Mg Tabs (Lisinopril) ..... One by mouth once daily on non hd days  Orders: Est. Patient Level V [id number])  Problem # 4:  HEPATITIS C  (ICD-070.51) Assessment: Unchanged  Does not want referral for treatment as long as involved injectables  Orders: Est. 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 Patient Level V [id number])  Medications Added to Medication List This Visit: 1)  Phoslo 667 Mg Caps (Calcium acetate (phos binder)) .... Take 4 capsules with meals and take 2 capsules with snacks 2)  Doxycycline Hyclate 100 Mg Caps (Doxycycline hyclate) .Marland Kitchen.. 1 capsule two times a day  Other Orders: Gynecologic Referral (Gyn)  Hypertension Assessment/Plan:      The patient's hypertensive risk group is category C: Target organ damage and/or diabetes.  Her calculated 10 year risk of coronary heart disease is 13 %.  Today's blood pressure is 149/72.  Her blood pressure goal is < 130/80.  Lipid Assessment/Plan:      Based on NCEP/ATP III, the patient's risk factor category is "history of diabetes".  The patient's lipid goals are as follows: Total cholesterol goal is 200; LDL cholesterol goal is 100; HDL cholesterol goal is 40; Triglyceride goal is 150.  Her LDL cholesterol goal has been met.    Patient Instructions: 1)  rtc in 4 months 2)  Advised not to eat any food or drink any liquids after 10 PM the night before procedure. 3)  Be sure to return for lab work one (1) week before your next appointment as scheduled.

## 2011-01-03 NOTE — Assessment & Plan Note (Signed)
Summary: 72MONTH F/U/VS   Visit Type:  Follow-up Primary Provider:  Adrian Prows MD  CC:  2 month follow up.  History of Present Illness: Follow up for HIV - last seen nov 2010 - started on lamivudine in place of d4t.  taking it ok.   Taking meds without any issue - 100% compliance. Doing great at HD.   PRIOR Followed up at [facility] ID for consideration of changing regimen since her CD4 ct is still not responded (thinks it was Dr Ralene Ok)  I reveiwed her records from [facility] and they woudl rec change from D4t to lamivudine.  When I saw in June we decided to change her after she came back from her trip to [place] in August.  She actually never started the lamivudine since she felt like she was too busy.  Going to HD still- Dr Archie Balboa is her nephrologist.    He d/ced lisinopril and amlodpine Following at [facility] for possible renal txp.  Had livier bxp done at [facility] and told there was a" little scarring".   Feeling well overall.  Graft doing ok at HD. No fevers chills NS, CP, sob, abd pain, NVD or other concenrnig symptoms Sugars at HD runnign well   Preventive Screening-Counseling & Management  Alcohol-Tobacco     Alcohol drinks/day: 0     Smoking Status: never     Year Quit: 2000  Caffeine-Diet-Exercise     Caffeine use/day: occassionally caffeine     Does Patient Exercise: yes     Type of exercise: walking     Exercise (avg: min/session): 30-60     Times/week: 3  Safety-Violence-Falls     Seat Belt Use: yes   Updated Prior Medication List: FREESTYLE LITE   STRP (GLUCOSE BLOOD) to test blood sugar 3x daily BD ULTRA-FINE 33 LANCETS   MISC (LANCETS) to test blood sugar 3 x daily ZIAGEN 300 MG TABS (ABACAVIR SULFATE) Take  tablet two times a day SUSTIVA 600 MG TABS (EFAVIRENZ) Take 1 tablet by mouth once a day CRESTOR 20 MG TABS (ROSUVASTATIN CALCIUM) 1 tablet by mouth daily ASPIRIN 81 MG  TBEC (ASPIRIN) one by mouth every day PHOSLO 667 MG  CAPS (CALCIUM ACETATE (PHOS BINDER)) take  3 capsules with meals and take 2 capsules with snacks FOSRENOL 1000 MG CHEW (LANTHANUM CARBONATE) one by mouth once daily EPIVIR 10 MG/ML SOLN (LAMIVUDINE) half a  teaspoons full (25 mg) daily - first day take one tablespoon (15 ml or 150 mg) LISINOPRIL 40 MG TABS (LISINOPRIL) one by mouth once daily on non hd days  Current Allergies (reviewed today): No known allergies  Past History:  Past Medical History: Last updated: 06/16/2008 1. HIV - dxed 1998 - was admitted with PCP PNA.   Virus had been undetectable but has been off on meds due to "tired of them" Her last VL was in 9/08 and does not remember but thinks it was undetectable.  CD4 at that time was "low" per pt - 150 or so.   Has tried multiple regimens but has had intolerance issues with N/V so has been Ziagen, Zerit and the Sustiva for several years.  Does tolerate these well but does get tired at times of taking so many pills  2. OIs- PCP in 1998 3. STDs - syphilis 1968.  Treated in past but apparently RPR still positive  4.  ESRD -started HD in Nov 08.  Due to DM. 5. DM - was on lantus but since losing 70 #s now only  on prandin with A1C of 5.0 6. HTN -  7. Gallbladder surgery 8.  TAH - for fibroid tumors 9. 3 vaginal deliveries 10. Anemia  11.  Hep C - no prior treatment.  No prior biopsy.    Family History: Last updated: 11/12/2007 Mother heart failure Father cancer, DM  Social History: Last updated: 05/06/2008 Lives alone now but daughter here in [place].  Tob none, drink - none now but prior heavy weekend drinker.  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  No other drugs.  Prior cocaine and marijuana.    Risk Factors: Alcohol Use: 0 (01/04/2010) Caffeine Use: occassionally caffeine (01/04/2010) Exercise: yes (01/04/2010)  Risk Factors: Smoking Status: never (01/04/2010)  Review of Systems       11 systems reviewed and negative except per HPI   Vital Signs:  Patient profile:   65 year old  female Height:      63 inches (160.02 cm) Weight:      135.3 pounds (61.50 kg) BMI:     24.05 Temp:     97.3 degrees F (36.28 degrees C) oral Pulse rate:   77 / minute BP sitting:   191 / 86  (left arm)  Vitals Entered By: Rocky Morel) (January 04, 2010 9:06 AM) CC: 2 month follow up Is Patient Diabetic? Yes Did you bring your meter with you today? No Pain Assessment Patient in pain? no      Nutritional Status BMI of 19 -24 = normal Nutritional Status Detail appetite is great per patient  Have you ever been in a relationship where you felt threatened, hurt or afraid?No   Does patient need assistance? Functional Status Self care Ambulation Normal   Physical Exam  General:  alert and well-developed.   Head:  normocephalic.   Ears:  R ear normal and L ear normal.   Mouth:  fair dentition.   Neck:  supple.   Lungs:  normal respiratory effort and normal breath sounds.   Heart:  normal rate and regular rhythm.   Abdomen:  soft and non-tender.   Msk:  normal ROM and no joint tenderness.   Extremities:  no cce Neurologic:  alert & oriented X3, cranial nerves II-XII intact, and strength normal in all extremities.   Skin:  r forearm graft wnl Psych:  memory intact for recent and remote.          Medication Adherence: 01/04/2010   Adherence to medications reviewed with patient. Counseling to provide adequate adherence provided   Prevention For Positives: 01/04/2010   Safe sex practices discussed with patient. Condoms offered.                             Impression & Recommendations:  Problem # 1:  HIV INFECTION (ICD-042)  Doing great after change of d4t to lamivudine per [facility] ID- ?Dr Ralene Ok.   Repeat VL <48 and cd4>200 Records reveiwed from [facility]  F/u  month The following medications were removed from the medication list:    Bactrim Ds 800-160 Mg Tabs (Sulfamethoxazole-trimethoprim) ..... One by mouth m w  f  Diagnostics Reviewed:  HIV: CDC-defined  AIDS (09/15/2008)   CD4: 150 (10/06/2009)   WBC: 7.0 (01/14/2009)   Hgb: 12.0 (01/14/2009)   HCT: 36.3 (01/14/2009)   Platelets: 220 (01/14/2009) HIV-1 RNA: <48 copies/mL (01/14/2009)   HBSAg: NEG (11/12/2007)  Diagnostics Reviewed:  HIV: CDC-defined AIDS (09/15/2008)   CD4: 250 (  12/21/2009)   WBC: 6.8 (12/20/2009)   Hgb: 9.6 (12/20/2009)   HCT: 28.6 (12/20/2009)   Platelets: 205 (12/20/2009) HIV-1 RNA: <48 copies/mL (12/20/2009)   HBSAg: NEG (11/12/2007)  Orders: Est. Patient Level IV (99214)Future Orders: T-CD4SP ([facility]) (CD4SP) ... 04/04/2010 T-HIV Viral Load [phone]) ... 04/04/2010 T-CBC w/Diff [id number]) ... 04/04/2010 T-Comprehensive Metabolic Panel ([id number]) ... 04/04/2010  Problem # 2:  UNSPECIFIED SYPHILIS (ICD-097.9) current 1:4 stable serofast state  Problem # 3:  [place] (ICD-V70.0) no paps due to TAH for non malignant fibroids Hep a and b immune.   Declines flu shot.  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  BOP high today but she is due for hd The following medications were removed from the medication list:    Metoprolol Tartrate 50 Mg Tabs (Metoprolol tartrate) .Marland Kitchen... 2 by mouth once daily Her updated medication list for this problem includes:    Lisinopril 40 Mg Tabs (Lisinopril) ..... One by mouth once daily on non hd days  BP today: 191/86 Prior BP: 144/69 (10/19/2009)  Labs  Reviewed: K+: 4.9 (12/20/2009) Creat: : 9.05 (12/20/2009)   Chol: 182 (08/28/2008)   HDL: 88 (08/28/2008)   LDL: 75 (08/28/2008)   TG: 97 (08/28/2008)  Medications Added to Medication List This Visit: 1)  Lisinopril 40 Mg Tabs (Lisinopril) .... One by mouth once daily on non hd days  Patient Instructions: 1)  Please schedule a follow-up appointment in 3 months (May) 2)  Be sure to return for lab work one (1) week before your next appointment as scheduled.  Process Orders Check Orders Results:     [facility]: Check successful Tests Sent for requisitioning (January 04, 2010 9:24 AM):     04/04/2010: [facility] -- T-HIV Viral Load [phone] (signed)     04/04/2010: [facility] -- T-CBC w/Diff [id number] (signed)     04/04/2010: [facility] -- T-Comprehensive Metabolic Panel [id number] (signed)   Prevention & Chronic Care Immunizations   Influenza vaccine: declined   (10/19/2009)    Tetanus booster: Not documented    Pneumococcal vaccine: Pneumovax  (12/29/2008)    H. zoster vaccine: Not documented  Colorectal Screening   Hemoccult: Not documented    Colonoscopy: Not documented  Other Screening   Pap smear: HYSTERECTOMY, 1982, NORMAL  (05/08/2008)    Mammogram: Assessment: BIRADS 1.   (06/09/2009)   Mammogram action/deferral: Screening mammogram in 1 year.     (06/09/2009)   Mammogram due: 05/2009    DXA bone density scan: Not documented   Smoking status: never  (01/04/2010)  Diabetes Mellitus   HgbA1C: 5.0  (05/06/2008)    Eye exam: Not documented    Foot exam: Not documented   High risk foot: Not documented   Foot care education: Not documented    Urine microalbumin/creatinine ratio: Not documented  Lipids   Total Cholesterol: 182  (  08/28/2008)   LDL: 75  (08/28/2008)   LDL Direct: Not documented   HDL: 88  (08/28/2008)   Triglycerides: 97  (08/28/2008)  Hypertension   Last  Blood Pressure: 191 / 86  (01/04/2010)   Serum creatinine: 9.05  (12/20/2009)   Serum potassium 4.9  (12/20/2009) CMP ordered   Self-Management Support :    Diabetes self-management support: Not documented   Last diabetes self-management training by diabetes educator: 11/20/2007    Hypertension self-management support: Not documented

## 2011-01-04 ENCOUNTER — Ambulatory Visit: Admit: 2011-01-04 | Payer: Self-pay | Admitting: Infectious Disease

## 2011-01-04 ENCOUNTER — Encounter (INDEPENDENT_AMBULATORY_CARE_PROVIDER_SITE_OTHER): Payer: Self-pay | Admitting: *Deleted

## 2011-01-09 ENCOUNTER — Encounter: Payer: Self-pay | Admitting: Infectious Disease

## 2011-01-11 ENCOUNTER — Encounter (INDEPENDENT_AMBULATORY_CARE_PROVIDER_SITE_OTHER): Payer: Self-pay | Admitting: *Deleted

## 2011-01-11 NOTE — Miscellaneous (Signed)
  Clinical Lists Changes  Observations: Added new observation of INCOMESOURCE: UNKNOWN (01/04/2011 12:16) Added new observation of HOUSEINCOME: 0  (01/04/2011 12:16) Added new observation of #CHILD<18 IN: Unknown  (01/04/2011 12:16) Added new observation of FAMILYSIZE: 1  (01/04/2011 12:16) Added new observation of HOUSING: Unknown  (01/04/2011 12:16) Added new observation of YEARLYEXPEN: 0  (01/04/2011 12:16) Added new observation of MARITAL STAT: Unknown  (01/04/2011 12:16)

## 2011-01-17 ENCOUNTER — Other Ambulatory Visit (HOSPITAL_COMMUNITY): Payer: Self-pay | Admitting: Nephrology

## 2011-01-17 DIAGNOSIS — N186 End stage renal disease: Secondary | ICD-10-CM

## 2011-01-19 NOTE — Miscellaneous (Signed)
  Clinical Lists Changes 

## 2011-01-26 ENCOUNTER — Telehealth: Payer: Self-pay | Admitting: Infectious Disease

## 2011-01-27 ENCOUNTER — Ambulatory Visit (HOSPITAL_COMMUNITY): Admission: RE | Admit: 2011-01-27 | Payer: Self-pay | Source: Ambulatory Visit

## 2011-01-30 ENCOUNTER — Encounter: Payer: Self-pay | Admitting: Infectious Disease

## 2011-01-30 ENCOUNTER — Other Ambulatory Visit: Payer: Self-pay | Admitting: Infectious Disease

## 2011-01-30 ENCOUNTER — Other Ambulatory Visit (HOSPITAL_COMMUNITY): Payer: Self-pay | Admitting: Nephrology

## 2011-01-30 ENCOUNTER — Ambulatory Visit (HOSPITAL_COMMUNITY)
Admission: RE | Admit: 2011-01-30 | Discharge: 2011-01-30 | Disposition: A | Discharge status: Home / Self Care | Payer: Medicare Other | Source: Ambulatory Visit | Attending: Nephrology | Admitting: Nephrology

## 2011-01-30 ENCOUNTER — Other Ambulatory Visit (INDEPENDENT_AMBULATORY_CARE_PROVIDER_SITE_OTHER): Payer: Medicare Other

## 2011-01-30 DIAGNOSIS — I871 Compression of vein: Secondary | ICD-10-CM | POA: Insufficient documentation

## 2011-01-30 DIAGNOSIS — B20 Human immunodeficiency virus [HIV] disease: Secondary | ICD-10-CM

## 2011-01-30 DIAGNOSIS — N186 End stage renal disease: Secondary | ICD-10-CM | POA: Insufficient documentation

## 2011-01-30 LAB — CONVERTED CEMR LAB
Albumin: 3.9 g/dL (ref 3.5–5.2)
BUN: 58 mg/dL — ABNORMAL HIGH (ref 6–23)
Basophils Absolute: 0 10*3/uL (ref 0.0–0.1)
Calcium: 9.8 mg/dL (ref 8.4–10.5)
Chloride: 97 meq/L (ref 96–112)
Creatinine, Ser: 9.78 mg/dL — ABNORMAL HIGH (ref 0.40–1.20)
Glucose, Bld: 97 mg/dL (ref 70–99)
HDL: 67 mg/dL (ref >39)
HIV-1 RNA Quant, Log: <1.3 (ref <1.30)
Hemoglobin: 11.7 g/dL — ABNORMAL LOW (ref 12.0–15.0)
LDL Cholesterol: 54 mg/dL (ref 0–99)
Lymphocytes Relative: 20 % (ref 12–46)
Lymphs Abs: 1.4 10*3/uL (ref 0.7–4.0)
Monocytes Absolute: 0.6 10*3/uL (ref 0.1–1.0)
Monocytes Relative: 9 % (ref 3–12)
Neutro Abs: 4.5 10*3/uL (ref 1.7–7.7)
Potassium: 5 meq/L (ref 3.5–5.3)
RBC: 4.21 M/uL (ref 3.87–5.11)
RPR Ser Ql: REACTIVE — AB
Total CHOL/HDL Ratio: 2.3
VLDL: 36 mg/dL (ref 0–40)
WBC: 6.9 10*3/uL (ref 4.0–10.5)

## 2011-01-30 MED ORDER — IOHEXOL 300 MG/ML  SOLN: 50.0000 mL | Freq: Once | INTRAMUSCULAR | Status: AC | PRN

## 2011-01-31 LAB — T-HELPER CELL (CD4) - (RCID CLINIC ONLY): CD4 % Helper T Cell: 15 % — ABNORMAL LOW (ref 33–55)

## 2011-02-09 NOTE — Miscellaneous (Signed)
Summary: [facility]   Imported By: Bonner Puna 01/30/2011 10:20:09  _____________________________________________________________________  External Attachment:    Type:   Image     Comment:   External Document

## 2011-02-09 NOTE — Progress Notes (Addendum)
Summary: pATIENT NEEDS FLU SHOT  Phone Note Outgoing Call   Call placed by: Alcide Evener MD,  January 26, 2011 4:01 PM Details for Reason: PT NEEDS FLU SHOT Summary of Call: Patient needs flu shot. Please call and have come in for flu shot and if pt refuses flu shot  please document in chart Initial call taken by: Alcide Evener MD,  January 26, 2011 4:01 PM  Follow-up for Phone Call        patient came in and had labs but did not stay to get flu vaccine. I called the patient to see if she could come back, but I got her voicemail. I did leave a message for her to come back if possible. Follow-up by: Jarrett Ables CMA,  January 30, 2011 10:10 AM        Appended Document: pATIENT NEEDS FLU SHOT !!  I wish we could have at least documented  a refusal--if she truly does not want the flu shot  Appended Document: pATIENT NEEDS FLU SHOT Patient refused over the phone Jarrett Ables, CMA  Appended Document: pATIENT NEEDS FLU SHOT iN PERSON SHE REFUSED AS WELL

## 2011-02-13 ENCOUNTER — Ambulatory Visit (INDEPENDENT_AMBULATORY_CARE_PROVIDER_SITE_OTHER): Payer: Medicare Other | Admitting: Infectious Disease

## 2011-02-13 ENCOUNTER — Encounter: Payer: Self-pay | Admitting: Infectious Disease

## 2011-02-13 DIAGNOSIS — N186 End stage renal disease: Secondary | ICD-10-CM

## 2011-02-13 DIAGNOSIS — Z90710 Acquired absence of both cervix and uterus: Secondary | ICD-10-CM

## 2011-02-13 DIAGNOSIS — I10 Essential (primary) hypertension: Secondary | ICD-10-CM

## 2011-02-13 DIAGNOSIS — B20 Human immunodeficiency virus [HIV] disease: Secondary | ICD-10-CM

## 2011-02-13 DIAGNOSIS — B1710 Acute hepatitis C without hepatic coma: Secondary | ICD-10-CM

## 2011-02-19 LAB — T-HELPER CELL (CD4) - (RCID CLINIC ONLY)
CD4 % Helper T Cell: 14 % — ABNORMAL LOW (ref 33–55)
CD4 T Cell Abs: 250 uL — ABNORMAL LOW (ref 400–2700)

## 2011-02-21 NOTE — Assessment & Plan Note (Signed)
Summary: F/U OV/VS   Vital Signs:  Patient profile:   65 year old female Menstrual status:  postmenopausal Height:      63 inches (160.02 cm) Weight:      136 pounds (61.82 kg) BMI:     24.18 Temp:     97.3 degrees F (36.28 degrees C) oral Pulse rate:   78 / minute BP sitting:   174 / 93  (left arm)  Vitals Entered By: Jarrett Ables CMA (February 13, 2011 9:40 AM) CC: f/u , Hypertension Management Is Patient Diabetic? Yes Did you bring your meter with you today? No Pain Assessment Patient in pain? no      Nutritional Status BMI of 19 -24 = normal Nutritional Status Detail nl  Does patient need assistance? Functional Status Self care Ambulation Normal LMP - Character: Partial hysterectomy due to fibroids, cervix removed,  left ovary still present     Menstrual Status postmenopausal Last PAP Result HYSTERECTOMY, 1982, NORMAL   Visit Type:  Follow-up Primary Provider:  Rhina Brackett Dam  CC:  f/u  and Hypertension Management.  History of Present Illness: 64 year old AA lady with HIV, AIDS, dx in 1990s, with untreated Hepatitis C who has ESRD and is getting HD. She is being evaluated for kidney transplant at [facility] and has appt with  Dr. Ralene Ok in ID first. She cannot recall her transplant ID docs name. We reviewed her labs today. I discussed rx for Hepatitis C for her, which typically involves referral to [facility] who then follow the pt in the same building as [place] on [address] here in [place]. HOwever she was reluctant to be rx when I described some of the toxicities with IFN and ribavirin. She also refused flu vacciantion claiming that a severe illnesss ensued in [place] after such a vaccine. From the history she gave me it sounded unrelated to the flu shot but she adamantly refused vaccination. I spent over 45 minutes with the pt including greater than 50% of time in face to face counselling of the pt and coordination of care.  Hypertension History:      Positive major  cardiovascular risk factors include female age 43 years old or older, diabetes, and hypertension.  Negative major cardiovascular risk factors include negative family history for ischemic heart disease and non-tobacco-user status.        Positive history for target organ damage include renal insufficiency.  Further assessment for target organ damage reveals no history of ASHD, stroke/TIA, or peripheral vascular disease.     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 Take  Tablet Two Times A Day 2)  Sustiva 600 Mg Tabs (Efavirenz) .... Take 1 Tablet By Mouth Once A Day 3)  Crestor 20 Mg Tabs (Rosuvastatin Calcium) .Marland Kitchen.. 1 Tablet By Mouth Daily 4)  Aspirin 81 Mg  Tbec (Aspirin) .... One By Mouth Every Day 5)  Phoslo 667 Mg  Caps (Calcium Acetate (Phos Binder)) .... Take 4  Capsules With Meals and Take 2 Capsules With Snacks 6)  Epivir 10 Mg/ml Soln (Lamivudine) .... Take 2.5 Ml  (25 Mg) Once Daily 7)  Lisinopril 40 Mg Tabs (Lisinopril) .... One By Mouth Once Daily On Non Hd Days 8)  Omeprazole 20 Mg Cpdr (Omeprazole) .... Take One Capsule By Mouth At Bedtime. 9)  Doxycycline Hyclate 100 Mg Caps (Doxycycline Hyclate) .Marland Kitchen.. 1 Capsule Two Times A Day  Current Medications (verified): 1)  Ziagen 300 Mg Tabs (Abacavir Sulfate) .... Take  Tablet Two Times A Day 2)  Sustiva 600 Mg Tabs (Efavirenz) .... Take 1 Tablet By Mouth Once A Day 3)  Crestor 20 Mg Tabs (Rosuvastatin Calcium) .Marland Kitchen.. 1 Tablet By  Mouth Daily 4)  Aspirin 81 Mg  Tbec (Aspirin) .... One By Mouth Every Day 5)  Phoslo 667 Mg  Caps (Calcium Acetate (Phos Binder)) .... Take 4 Capsules With Meals and Take 2 Capsules With Snacks 6)  Epivir 10 Mg/ml Soln (Lamivudine) .... Take 2.5 Ml  (25 Mg) Once Daily 7)  Lisinopril 40 Mg Tabs (Lisinopril) .... One By Mouth Once Daily On Non Hd Days 8)  Omeprazole 20 Mg Cpdr (Omeprazole) .... Take One Capsule By Mouth At Bedtime. 9)  Amlodipine Besylate 5 Mg Tabs (Amlodipine Besylate) .... Take 1 Tablet By Mouth Once A Day  Allergies (verified): No Known Drug Allergies  Past History:  Past Medical History: 1. HIV - dxed 1998 - was admitted with PCP PNA.   3. STDs - syphilis 1968.  Treated in past but apparently RPR still positive  4.  ESRD -started HD in Nov 08.  Dr. Donato Heinz, [place] 5. DM - was on lantus but since losing 70 #s now only on prandin with A1C of 5.0 6. HTN -  7. Gallbladder surgery 8.  TAH - for fibroid tumors 9. 3 vaginal deliveries 10. Anemia  11.  Hep C - no prior treatment.  No prior biopsy.    Additional History Menstrual Status:  postmenopausal  Review of Systems       see HPI otherwise negative on 12 point ros       Medication Adherence: 02/13/2011   Adherence to medications reviewed with patient. Counseling to provide adequate adherence provided                                Physical Exam  General:  alert and well-developed.   Head:  normocephalic and atraumatic.   Eyes:  vision grossly intact and pupils equal.   Ears:  R ear normal and L ear normal.   Nose:  no external deformity and no nasal discharge.   Mouth:  fair dentition.  pharynx pink and moist and no erythema.   Neck:  supple.   Lungs:  normal respiratory effort, no accessory muscle use, and normal breath sounds.   Heart:  normal rate and regular rhythm.   Abdomen:  soft, non-tender, and normal bowel sounds.   Msk:  normal ROM, no joint tenderness, and no joint swelling.     Neurologic:  alert & oriented X3,d strength normal in all extremities.   Skin:  no rashes.   Psych:  Oriented X3 and memory intact for recent and remote.  initially  al little tired appearinb but brightened jup during interview   Impression & Recommendations:  Problem # 1:  HIV INFECTION (ICD-042)  superb control! The following medications were removed from the medication list:  Doxycycline Hyclate 100 Mg Caps (Doxycycline hyclate) .Marland Kitchen... 1 capsule two times a day  Orders: Est. Patient Level V (99215)Future Orders: T-CD4SP ([facility]) (CD4SP) ... 05/14/2011 T-HIV Viral Load [phone]) ... 05/14/2011 T-CBC w/Diff [id number]) ... 05/14/2011 T-Comprehensive Metabolic Panel ([id number]) ... 05/14/2011 T-Lipid Profile [phone]) ... 05/14/2011  Diagnostics Reviewed:  HIV: CDC-defined AIDS (09/15/2008)   CD4: 230 (01/31/2011)   WBC: 6.9 (01/30/2011)   Hgb: 11.7 (01/30/2011)   HCT: 36.9 (01/30/2011)   Platelets: 214 (01/30/2011) HIV-1 RNA: <20 copies/mL (01/30/2011)   HBSAg: NEG (11/12/2007)  Problem # 2:  ESSENTIAL HYPERTENSION, BENIGN (ICD-401.1)  poorly controlled. Added norvasc Her updated medication list for this problem includes:    Lisinopril 40 Mg Tabs (Lisinopril) ..... One by mouth once daily on non hd days    Amlodipine Besylate 5 Mg Tabs (Amlodipine besylate) .Marland Kitchen... Take 1 tablet by mouth once a day  Orders: Est. 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 Patient Level V [id number])  Problem # 4:  HEPATITIS C (ICD-070.51)  never treated. I offered referral for treatment but she wishes to wait for IFN free therapies. She does not have any active depression and I think she could go thru current regimen of PI, IFN and ribavirin but she did not want to unless the [facility] ID or transplant MDs insisted on this  Orders: Est. 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 Patient Level V [id number])  Medications Added to Medication List This Visit: 1)  Amlodipine Besylate 5 Mg Tabs (Amlodipine besylate) .... Take 1 tablet by mouth once a day  Hypertension Assessment/Plan:      The patient's hypertensive risk group is category C: Target organ damage and/or diabetes.  Her calculated 10 year risk of coronary heart disease is 15 %.  Today's blood pressure is 174/93.  Her blood pressure goal is < 130/80.  Patient Instructions: 1)  fu appt in 3 months time 2)  we need to consider rx of hep c 3)  you should consider flu shot next season  Prescriptions: AMLODIPINE BESYLATE 5 MG TABS (AMLODIPINE BESYLATE) Take 1 tablet by mouth once a day  #30 x 11   Entered and Authorized by:   Alcide Evener MD   Signed by:   Rhina Brackett Dam MD on 02/13/2011   Method used:   Electronically to        [facility] [phone]* (retail)       [address]       [place], [place]  [address]       Ph: [phone] or [phone]       Fax: [phone]   RxID:   [id number]    Orders Added: 1)  T-CD4SP ([facility]) [CD4SP] 2)  T-HIV Viral Load [phone] 3)  T-CBC w/Diff [id number] 4)  T-Comprehensive Metabolic Panel [id number] 5)  T-Lipid Profile [[id number]] 6)  Est. Patient Level V [id number]

## 2011-02-26 LAB — HEPATIC FUNCTION PANEL
Alkaline Phosphatase: 115 U/L (ref 39–117)
Indirect Bilirubin: 0.5 mg/dL (ref 0.3–0.9)
Total Protein: 7.8 g/dL (ref 6.0–8.3)

## 2011-02-26 LAB — POCT I-STAT 4, (NA,K, GLUC, HGB,HCT)
HCT: 32 % — ABNORMAL LOW (ref 36.0–46.0)
Sodium: 138 mEq/L (ref 135–145)

## 2011-02-26 LAB — GLUCOSE, CAPILLARY: Glucose-Capillary: 87 mg/dL (ref 70–99)

## 2011-03-08 ENCOUNTER — Encounter: Payer: Medicare Other | Admitting: Obstetrics and Gynecology

## 2011-03-13 ENCOUNTER — Encounter: Payer: Self-pay | Admitting: Infectious Disease

## 2011-03-13 ENCOUNTER — Other Ambulatory Visit: Payer: Self-pay | Admitting: *Deleted

## 2011-03-13 DIAGNOSIS — B20 Human immunodeficiency virus [HIV] disease: Secondary | ICD-10-CM

## 2011-03-13 NOTE — Telephone Encounter (Signed)
Pt called for a refill of her epivir liquid. States she always runs out. When I abstracted it in, it will not fill for 2.27ml, states it will be 3 ml, which will provide a higher dose. Is this ok or do you want to change it to a tab?

## 2011-03-14 ENCOUNTER — Telehealth: Payer: Self-pay | Admitting: Infectious Disease

## 2011-03-14 DIAGNOSIS — B20 Human immunodeficiency virus [HIV] disease: Secondary | ICD-10-CM

## 2011-03-14 LAB — T-HELPER CELL (CD4) - (RCID CLINIC ONLY): CD4 % Helper T Cell: 13 % — ABNORMAL LOW (ref 33–55)

## 2011-03-14 MED ORDER — LAMIVUDINE 10 MG/ML PO SOLN: 25.0000 mg | Freq: Every day | ORAL | Status: DC

## 2011-03-14 NOTE — Telephone Encounter (Signed)
THISDOSE SHOULD BE 2.5ML NOT 3 ML. THAT IS THE RECOMMENDED DOSE BASED ON GFR

## 2011-03-14 NOTE — Telephone Encounter (Signed)
The CORRECT DOSE ADJUSTED FOR THE FACT THAT SHE IS ON DIALYSIS IS 2.5ML OF 10MG .ML SOLUTION FOR 25MG  TOTAL DAILY DOSE. I DONT UNDERSTAND THE PROBLEM

## 2011-03-15 LAB — T-HELPER CELL (CD4) - (RCID CLINIC ONLY): CD4 T Cell Abs: 180 uL — ABNORMAL LOW (ref 400–2700)

## 2011-03-20 LAB — CBC
HCT: 35.8 % — ABNORMAL LOW (ref 36.0–46.0)
MCV: 100.7 fL — ABNORMAL HIGH (ref 78.0–100.0)
Platelets: 200 10*3/uL (ref 150–400)
RDW: 15.1 % (ref 11.5–15.5)

## 2011-03-20 LAB — GLUCOSE, CAPILLARY: Glucose-Capillary: 110 mg/dL — ABNORMAL HIGH (ref 70–99)

## 2011-03-20 LAB — T-HELPER CELL (CD4) - (RCID CLINIC ONLY)
CD4 % Helper T Cell: 11 % — ABNORMAL LOW (ref 33–55)
CD4 T Cell Abs: 140 uL — ABNORMAL LOW (ref 400–2700)

## 2011-03-20 LAB — PROTIME-INR: Prothrombin Time: 13.5 seconds (ref 11.6–15.2)

## 2011-03-21 ENCOUNTER — Inpatient Hospital Stay (HOSPITAL_COMMUNITY)
Admission: EM | Admit: 2011-03-21 | Discharge: 2011-03-27 | DRG: 377 | Disposition: A | Discharge status: Home / Self Care | Payer: Medicare Other | Attending: Infectious Diseases | Admitting: Infectious Diseases

## 2011-03-21 ENCOUNTER — Encounter: Payer: Self-pay | Admitting: Internal Medicine

## 2011-03-21 ENCOUNTER — Inpatient Hospital Stay (HOSPITAL_COMMUNITY): Payer: Medicare Other

## 2011-03-21 DIAGNOSIS — B20 Human immunodeficiency virus [HIV] disease: Secondary | ICD-10-CM | POA: Diagnosis present

## 2011-03-21 DIAGNOSIS — B1920 Unspecified viral hepatitis C without hepatic coma: Secondary | ICD-10-CM | POA: Diagnosis present

## 2011-03-21 DIAGNOSIS — Z992 Dependence on renal dialysis: Secondary | ICD-10-CM

## 2011-03-21 DIAGNOSIS — I120 Hypertensive chronic kidney disease with stage 5 chronic kidney disease or end stage renal disease: Secondary | ICD-10-CM | POA: Diagnosis present

## 2011-03-21 DIAGNOSIS — N2581 Secondary hyperparathyroidism of renal origin: Secondary | ICD-10-CM | POA: Diagnosis present

## 2011-03-21 DIAGNOSIS — Z7982 Long term (current) use of aspirin: Secondary | ICD-10-CM

## 2011-03-21 DIAGNOSIS — N186 End stage renal disease: Secondary | ICD-10-CM | POA: Diagnosis present

## 2011-03-21 DIAGNOSIS — D62 Acute posthemorrhagic anemia: Secondary | ICD-10-CM | POA: Diagnosis present

## 2011-03-21 DIAGNOSIS — R5383 Other fatigue: Secondary | ICD-10-CM | POA: Diagnosis present

## 2011-03-21 DIAGNOSIS — E119 Type 2 diabetes mellitus without complications: Secondary | ICD-10-CM | POA: Diagnosis present

## 2011-03-21 DIAGNOSIS — K31811 Angiodysplasia of stomach and duodenum with bleeding: Principal | ICD-10-CM | POA: Diagnosis present

## 2011-03-21 DIAGNOSIS — R5381 Other malaise: Secondary | ICD-10-CM | POA: Diagnosis present

## 2011-03-21 LAB — COMPREHENSIVE METABOLIC PANEL
ALT: 43 U/L — ABNORMAL HIGH (ref 0–35)
Albumin: 2.8 g/dL — ABNORMAL LOW (ref 3.5–5.2)
Alkaline Phosphatase: 90 U/L (ref 39–117)
Glucose, Bld: 97 mg/dL (ref 70–99)
Potassium: 3.9 mEq/L (ref 3.5–5.1)
Sodium: 135 mEq/L (ref 135–145)
Total Protein: 6.3 g/dL (ref 6.0–8.3)

## 2011-03-21 LAB — CBC
HCT: 18.8 % — ABNORMAL LOW (ref 36.0–46.0)
HCT: 21.8 % — ABNORMAL LOW (ref 36.0–46.0)
Hemoglobin: 6.4 g/dL — CL (ref 12.0–15.0)
MCV: 87 fL (ref 78.0–100.0)
MCV: 87.2 fL (ref 78.0–100.0)
Platelets: 184 10*3/uL (ref 150–400)
RBC: 2.16 MIL/uL — ABNORMAL LOW (ref 3.87–5.11)
RBC: 2.5 MIL/uL — ABNORMAL LOW (ref 3.87–5.11)
WBC: 6 10*3/uL (ref 4.0–10.5)
WBC: 6.3 10*3/uL (ref 4.0–10.5)

## 2011-03-21 LAB — PROTIME-INR: Prothrombin Time: 12 seconds (ref 11.6–15.2)

## 2011-03-21 LAB — BASIC METABOLIC PANEL
CO2: 33 mEq/L — ABNORMAL HIGH (ref 19–32)
Calcium: 8.9 mg/dL (ref 8.4–10.5)
Creatinine, Ser: 3.86 mg/dL — ABNORMAL HIGH (ref 0.4–1.2)
GFR calc Af Amer: 14 mL/min — ABNORMAL LOW (ref >60)

## 2011-03-21 LAB — DIFFERENTIAL
Eosinophils Absolute: 0.3 10*3/uL (ref 0.0–0.7)
Lymphocytes Relative: 16 % (ref 12–46)
Lymphs Abs: 1 10*3/uL (ref 0.7–4.0)
Neutro Abs: 4.3 10*3/uL (ref 1.7–7.7)
Neutrophils Relative %: 72 % (ref 43–77)

## 2011-03-21 LAB — POCT CARDIAC MARKERS
CKMB, poc: <1 ng/mL — ABNORMAL LOW (ref 1.0–8.0)
Troponin i, poc: <0.05 ng/mL (ref 0.00–0.09)

## 2011-03-21 LAB — OCCULT BLOOD, POC DEVICE: Fecal Occult Bld: POSITIVE

## 2011-03-21 MED ORDER — LAMIVUDINE 10 MG/ML PO SOLN: 2.5000 mg | Freq: Every day | ORAL | Status: DC

## 2011-03-21 NOTE — H&P (Signed)
Hospital Admission Note Date: 03/21/2011  Patient name: [name] Medical record number: [id number] Date of birth: 1946-11-03 Age: 65 y.o. Gender: female PCP: Alcide Evener, MD, MD  Medical Service:  Attending physician:Dr. Eulis Canner   Pager: Resident [phone]): Dr. Kelton Pillar   Pager: [phone] Resident (R1): Dr. Obie Dredge    [id number]  Chief Complaint:2 wk hx of weakness and "dark stools"  History of Present Illness: [name] is a 65 yo W with PMH of HIV, HCV, HTN and renal failure who presents with a 2 week history of weakness and "dark stools."  Pt states that for the past 2 weeks, she has felt tired and it is hard for her to breathe or to walk.  She attributed these symptoms to a possible cold as she had also had a "stuffy nose and cough."  The cough is productive with increased amounts of white phlegm.  She denies any fever, but states that she has had some night sweats during this time period.  She was previously diagnosed with iron deficiency anemia, but has not taken any iron recently because her "numbers were fine."    Yesterday, while walking at a brisk pace she also states that she felt a "heaviness" in her chest like something was sitting on it.  This heaviness was localized to the middle of her chest, and stopped as soon as she stopped to rest.  She denies any diaphoresis or radiating pain.    In regards to her dark stool, she states that it has been of primarily a normal consistency, with an occasional loose stool that she would not classify as diarrhea.  She attributed this change in color to the fact that she recently had been eating collard greens.  She has not had any bright red blood in her stool, though occasionally when she wipes she sees blood.  Pt states that she believes this blood to be due to her hemorrhoids.  FOBT performed in the ED was positive.  Her last colonoscopy ~10 years ago and it was normal, she is scheduled for her next one in 2014.    No current  outpatient prescriptions on file.    Allergies: Review of patient's allergies indicates not on file.  Past Medical History  Diagnosis Date  . HIV infection     Dx in 1998 in [place], she presented with PCP pneumonia at that time. Has been tried on multiple regimens by her PCP in [place] before./ She has been on current ART for years now. Moved to [facility] in 2008 and is following with Dr. Tommy Medal since then.   . Hepatitis C     untreated. VL [id number] in 2008  . Diabetes mellitus     Las HbA1C   . Hypertension   . Chronic kidney disease     ESRD secondary to DM, started HD in 2008, HD TTS, Dr. Jimmy Footman is her nephrologist, on transplant list at [facility].    Past Surgical History  Procedure Date  . Abdominal hysterectomy       and oopherectomy for fibroids    -  Cholecystectomy    No family history on file.  Social History:  Lives alone, moved from [place] in 2008, 3 children, no smoking or alcohol since 1998 when she was diagnosed with HIV, 2 cig/day for 20 yrs and occasional alcohol and marijuana before that. On disability. She used to do secretarial work but quit in 1998.   Family history- significant for heart disease and prostate cancer  in her father , mother had DM  Review of Systems: A comprehensive ROS is negative except as per HPI  Physical Exam:  There were no vitals filed for this visit. General appearance: alert, cooperative and no distress Eyes: positive findings: sclera pallor Neck: no adenopathy, no carotid bruit, no JVD, supple, symmetrical, trachea midline and thyroid not enlarged, symmetric, no tenderness/mass/nodules Lungs: clear to auscultation bilaterally Heart: regular rate and rhythm, S1, S2 normal, mild sys murmur, click, rub or gallop Abdomen: soft, non-tender; bowel sounds normal; no masses,  no organomegaly. Mild epigastric tenderness Rectal: No external abnormality like hemorrhoids or masses, normal appearing stool with FOBT + Extremities: extremities normal,  atraumatic, no cyanosis or edema Pulses: 2+ and symmetric Neurologic: Grossly normal   Lab results:  CBC:    Component Value Date/Time   WBC 6.0 03/21/2011 1227   HGB 6.4 REPEATED TO VERIFY CRITICAL RESULT CALLED TO, READ BACK BY AND VERIFIED WITH: ADAMS E.RN AT 13:06 03/21/11 BY EDWARDS,L* 03/21/2011 1227   HCT 18.8* 03/21/2011 1227   PLT 184 03/21/2011 1227   MCV 87.0 03/21/2011 1227   NEUTROABS 4.3 03/21/2011 1227   LYMPHSABS 1.0 03/21/2011 1227   MONOABS 0.4 03/21/2011 1227   EOSABS 0.3 03/21/2011 1227   BASOSABS 0.0 03/21/2011 1227   Comprehensive Metabolic Panel:    Component Value Date/Time   NA 137 03/21/2011 1227   K 3.7 03/21/2011 1227   CL 98 03/21/2011 1227   CO2 33* 03/21/2011 1227   BUN 18 03/21/2011 1227   CREATININE 3.86* 03/21/2011 1227   GLUCOSE 123* 03/21/2011 1227   CALCIUM 8.9 03/21/2011 1227   AST 57* 01/30/2011 1643   ALT 47* 01/30/2011 1643   ALKPHOS 107 01/30/2011 1643   BILITOT 0.5 01/30/2011 1643   PROT 7.8 01/30/2011 1643   ALBUMIN 3.9 01/30/2011 1643   PT/INR- 12.0/0.87  POC CE- negative   Imaging results:   CXR Other results: EKG- Assessment & Plan by Problem:  1.  Hx of melena: Pt has a 2 week history of intermittent melena along with a hemoglobin of 6.4 that is makes a diagnosis of GI bleed the most likely cause.  The differential diagnosis includes upper GI bleeding (duodenal ulcer, gastric ulcer and NSAID use) vs lower GI bleeding (malignancy, AVM, diverticulosis).  Pt denies NSAID use, but does currently take omeprazole, making duodenal/gastric ulcer a likely cause of melena in this case.   -- As far as resuscitation in concerned, her vitals are stable and she is not profusely bleeding on her rectal exam. Patient is receiving 2 units right now, if gets short of breath (renal failure), will give 40 mg iv lasix between transfusions -- 12 lead EKG for silent ischemia --Consult ed GI service for endoscopy.  Will keep npo after midnight for procedure  tomorrow. If endoscopy is negative, prep bowels for colonoscopy tomorrow.   --Repeat CBC to determine if pt's hemoglobin responding appropriately to transfusion  . 2.  SOB/cough: Pt has a 2 week history of weakness, SOB, and productive cough.   --Chest x-ray to r/o pnuemonia --12 lead EKG  3.  HIV: Pt is compliant on medications and her HIV appears to be well-controlled as her viral loads have been suppressed; CD4 counts are low, but stable with the last value of 230 in Feb '12.  At this point, this does not appear to be contributing to primary symptoms of SOB, melena and cough.  Will continue to monitor. --Start home HIV meds:  4.  Renal  disease: Pt had dialysis prior to admission today, continue to monitor daily chemistries.    Renal consulted. Dr. Hassell Done to see the patient on the floor.   5.  HTN: Stable, hold BP meds in setting of acute bleeding. May restart if hypertensive  6.  Hepatitis C: It is unknown how or when pt was diagnosed with HCV.  Not currently taking any medications.    7.  Anemia of Chronic disease (Baseline 9-10): Pt's hemoglobin is low, will receive 2 units pRBCs and re-assess condition with CBC after transfusion.  Pt has not been on iron supplementation for some time, consider re-starting on discharge.    8. DVT Px- SCD

## 2011-03-21 NOTE — Telephone Encounter (Signed)
I called it in 

## 2011-03-22 ENCOUNTER — Other Ambulatory Visit: Payer: Self-pay | Admitting: Gastroenterology

## 2011-03-22 DIAGNOSIS — K922 Gastrointestinal hemorrhage, unspecified: Secondary | ICD-10-CM

## 2011-03-22 LAB — HEMOGLOBIN A1C
Hgb A1c MFr Bld: 5.6 % (ref <5.7)
Mean Plasma Glucose: 114 mg/dL (ref <117)

## 2011-03-22 LAB — TYPE AND SCREEN
Antibody Screen: NEGATIVE
Unit division: 0

## 2011-03-22 LAB — CBC
Hemoglobin: 8.9 g/dL — ABNORMAL LOW (ref 12.0–15.0)
MCH: 30.1 pg (ref 26.0–34.0)
MCHC: 34.3 g/dL (ref 30.0–36.0)
MCHC: 34.4 g/dL (ref 30.0–36.0)
MCV: 87.1 fL (ref 78.0–100.0)
Platelets: 141 10*3/uL — ABNORMAL LOW (ref 150–400)
Platelets: 143 10*3/uL — ABNORMAL LOW (ref 150–400)
Platelets: 145 10*3/uL — ABNORMAL LOW (ref 150–400)
RBC: 2.97 MIL/uL — ABNORMAL LOW (ref 3.87–5.11)
RDW: 15.6 % — ABNORMAL HIGH (ref 11.5–15.5)
RDW: 16.1 % — ABNORMAL HIGH (ref 11.5–15.5)
WBC: 7.2 10*3/uL (ref 4.0–10.5)

## 2011-03-22 LAB — RENAL FUNCTION PANEL
Calcium: 8.1 mg/dL — ABNORMAL LOW (ref 8.4–10.5)
GFR calc Af Amer: 8 mL/min — ABNORMAL LOW (ref >60)
GFR calc non Af Amer: 7 mL/min — ABNORMAL LOW (ref >60)
Phosphorus: 3.3 mg/dL (ref 2.3–4.6)
Sodium: 140 mEq/L (ref 135–145)

## 2011-03-22 LAB — LIPID PANEL
Cholesterol: 155 mg/dL (ref 0–200)
Total CHOL/HDL Ratio: 2.8 RATIO

## 2011-03-23 ENCOUNTER — Inpatient Hospital Stay (HOSPITAL_COMMUNITY): Payer: Medicare Other

## 2011-03-23 LAB — COMPREHENSIVE METABOLIC PANEL
ALT: 50 U/L — ABNORMAL HIGH (ref 0–35)
AST: 63 U/L — ABNORMAL HIGH (ref 0–37)
Albumin: 2.5 g/dL — ABNORMAL LOW (ref 3.5–5.2)
Alkaline Phosphatase: 87 U/L (ref 39–117)
BUN: 43 mg/dL — ABNORMAL HIGH (ref 6–23)
Chloride: 100 mEq/L (ref 96–112)
Potassium: 4.5 mEq/L (ref 3.5–5.1)
Sodium: 138 mEq/L (ref 135–145)
Total Bilirubin: 0.4 mg/dL (ref 0.3–1.2)

## 2011-03-23 LAB — CBC
MCV: 87.4 fL (ref 78.0–100.0)
Platelets: 140 10*3/uL — ABNORMAL LOW (ref 150–400)
RBC: 2.85 MIL/uL — ABNORMAL LOW (ref 3.87–5.11)
WBC: 7 10*3/uL (ref 4.0–10.5)

## 2011-03-23 LAB — INFLUENZA A ABS IGG & IGM

## 2011-03-24 LAB — BASIC METABOLIC PANEL
BUN: 22 mg/dL (ref 6–23)
CO2: 31 mEq/L (ref 19–32)
Calcium: 9 mg/dL (ref 8.4–10.5)
Creatinine, Ser: 5.06 mg/dL — ABNORMAL HIGH (ref 0.4–1.2)
GFR calc non Af Amer: 9 mL/min — ABNORMAL LOW (ref >60)
Glucose, Bld: 90 mg/dL (ref 70–99)

## 2011-03-24 LAB — CBC
HCT: 23.9 % — ABNORMAL LOW (ref 36.0–46.0)
MCH: 30.1 pg (ref 26.0–34.0)
MCHC: 33.9 g/dL (ref 30.0–36.0)
MCV: 88.8 fL (ref 78.0–100.0)
RDW: 15.7 % — ABNORMAL HIGH (ref 11.5–15.5)

## 2011-03-25 ENCOUNTER — Other Ambulatory Visit: Payer: Self-pay | Admitting: Internal Medicine

## 2011-03-25 ENCOUNTER — Inpatient Hospital Stay (HOSPITAL_COMMUNITY): Payer: Medicare Other

## 2011-03-25 DIAGNOSIS — R933 Abnormal findings on diagnostic imaging of other parts of digestive tract: Secondary | ICD-10-CM

## 2011-03-25 DIAGNOSIS — K225 Diverticulum of esophagus, acquired: Secondary | ICD-10-CM

## 2011-03-25 DIAGNOSIS — K269 Duodenal ulcer, unspecified as acute or chronic, without hemorrhage or perforation: Secondary | ICD-10-CM

## 2011-03-25 DIAGNOSIS — K922 Gastrointestinal hemorrhage, unspecified: Secondary | ICD-10-CM

## 2011-03-25 LAB — RENAL FUNCTION PANEL
Calcium: 9.3 mg/dL (ref 8.4–10.5)
Creatinine, Ser: 7.54 mg/dL — ABNORMAL HIGH (ref 0.4–1.2)
GFR calc Af Amer: 7 mL/min — ABNORMAL LOW (ref >60)
GFR calc non Af Amer: 5 mL/min — ABNORMAL LOW (ref >60)
Glucose, Bld: 97 mg/dL (ref 70–99)
Phosphorus: 4.8 mg/dL — ABNORMAL HIGH (ref 2.3–4.6)
Sodium: 136 mEq/L (ref 135–145)

## 2011-03-25 LAB — CBC
MCH: 30.1 pg (ref 26.0–34.0)
MCHC: 33.8 g/dL (ref 30.0–36.0)
RDW: 15.7 % — ABNORMAL HIGH (ref 11.5–15.5)

## 2011-03-26 DIAGNOSIS — K922 Gastrointestinal hemorrhage, unspecified: Secondary | ICD-10-CM

## 2011-03-26 LAB — TYPE AND SCREEN
ABO/RH(D): O POS
Unit division: 0

## 2011-03-26 LAB — BASIC METABOLIC PANEL
CO2: 30 mEq/L (ref 19–32)
Calcium: 9 mg/dL (ref 8.4–10.5)
Chloride: 99 mEq/L (ref 96–112)
GFR calc Af Amer: 11 mL/min — ABNORMAL LOW (ref >60)
Glucose, Bld: 116 mg/dL — ABNORMAL HIGH (ref 70–99)
Potassium: 3.5 mEq/L (ref 3.5–5.1)
Sodium: 139 mEq/L (ref 135–145)

## 2011-03-26 LAB — CBC
HCT: 26.9 % — ABNORMAL LOW (ref 36.0–46.0)
Hemoglobin: 9.3 g/dL — ABNORMAL LOW (ref 12.0–15.0)
MCV: 88.8 fL (ref 78.0–100.0)
RDW: 14.8 % (ref 11.5–15.5)
WBC: 7.4 10*3/uL (ref 4.0–10.5)

## 2011-03-26 LAB — RENAL FUNCTION PANEL
BUN: 23 mg/dL (ref 6–23)
CO2: 32 mEq/L (ref 19–32)
Chloride: 99 mEq/L (ref 96–112)
Glucose, Bld: 115 mg/dL — ABNORMAL HIGH (ref 70–99)
Potassium: 3.5 mEq/L (ref 3.5–5.1)
Sodium: 139 mEq/L (ref 135–145)

## 2011-03-26 NOTE — Consult Note (Signed)
[name], [name]               ACCOUNT NO.:  [id number]  MEDICAL RECORD NO.:  [id number]           PATIENT TYPE:  I  LOCATION:  [id number]                         FACILITY:  [facility]  PHYSICIAN:  Tory Emerald. Benson Norway, MD    DATE OF BIRTH:  1946/05/21  DATE OF CONSULTATION:  03/21/2011 DATE OF DISCHARGE:                                CONSULTATION   REASON FOR CONSULTATION:  Melena and anemia.  This is an unassigned teaching service patient.  HISTORY OF PRESENT ILLNESS:  This is a 65 year old female with a past medical history of end-stage renal disease secondary to diabetes, HIV, status post cholecystectomy, status post hysterectomy, hypertension, and CHF who was admitted to the hospital with complaints of shortness of breath.  The patient states that she is not having issues of melena for the past 2 weeks, although for the past 2 weeks she subsequently had her dialysis today and complained about having the shortness of breath.  A check of hemoglobin revealed that was it at 6.9.  Previously, it was at 12.7 on February 10, 2011.  As a result of these current findings, the patient was referred to the hospital for further evaluation and treatment.  The patient denies having any kind of abdominal pain, nausea, vomiting, or diarrhea.  She had a colonoscopy in 2004 while she was living in [place] and was negative for any abnormalities.  She was told that she would have a followup in 10 years which would be 2014. She denies having any history of peptic ulcer disease, although she does have some issues with acid reflux and takes omeprazole on a routine basis.  As a result of the current findings, a GI consultation was requested for further evaluation and treatment.  PAST MEDICAL HISTORY:  As stated above.  FAMILY HISTORY:  Noncontributory.  SOCIAL HISTORY:  Negative for alcohol, tobacco, or illicit drug use.  REVIEW OF SYSTEMS:  As stated above in the history present illness.  PHYSICAL  EXAMINATION:  VITAL SIGNS:  Blood pressure is 133/68, heart rate is 73, and temperature is 98.5. GENERAL:  The patient is in no acute distress, alert and oriented. HEENT:  Normocephalic and atraumatic.  Extraocular muscles intact. NECK:  Supple.  No lymphadenopathy. LUNGS:  Clear to auscultation bilaterally. CARDIOVASCULAR:  Regular rate and rhythm. ABDOMEN:  Flat, soft, nontender, and nondistended. EXTREMITIES:  No clubbing, cyanosis, or edema.  MEDICATIONS:  Please see the full medication list in e-chart, but currently she has been started on Protonix, Crestor, Epivir, PhosLo, abacavir, and efavirenz.  IMPRESSION: 1. Melena. 2. Heme-positive stool. 3. End-stage renal disease. 4. Shortness of breath.  Certainly, the patient requires further evaluation with an EGD.  She may need a colonoscopy, but I feel that her symptoms are coming from upper GI source.  She is on dialysis and I am uncertain if she did receive some heparin at the time that she underwent the hemodialysis.  Further recommendations will be made pending the findings.  LABORATORY VALUES:  White blood cell count 6.0, hemoglobin 6.4, and platelets at 184.  Sodium 137, potassium 3.7, chloride 98, CO2 of 33, BUN  18, creatinine 3.8, glucose 123, and calcium 8.9.     Tory Emerald Benson Norway, MD     PDH/MEDQ  D:  03/21/2011  T:  03/22/2011  Job:  [id number]  Electronically Signed by Carol Ada MD on 03/26/2011 08:55:29 PM

## 2011-03-27 DIAGNOSIS — K922 Gastrointestinal hemorrhage, unspecified: Secondary | ICD-10-CM

## 2011-03-27 LAB — CBC
HCT: 25.6 % — ABNORMAL LOW (ref 36.0–46.0)
Hemoglobin: 8.7 g/dL — ABNORMAL LOW (ref 12.0–15.0)
MCH: 30.5 pg (ref 26.0–34.0)
MCV: 89.8 fL (ref 78.0–100.0)
Platelets: 110 10*3/uL — ABNORMAL LOW (ref 150–400)
RBC: 2.85 MIL/uL — ABNORMAL LOW (ref 3.87–5.11)
WBC: 6.5 10*3/uL (ref 4.0–10.5)

## 2011-03-27 LAB — BASIC METABOLIC PANEL
CO2: 29 mEq/L (ref 19–32)
GFR calc Af Amer: 7 mL/min — ABNORMAL LOW (ref >60)
GFR calc non Af Amer: 6 mL/min — ABNORMAL LOW (ref >60)
Glucose, Bld: 156 mg/dL — ABNORMAL HIGH (ref 70–99)
Potassium: 3.9 mEq/L (ref 3.5–5.1)
Sodium: 138 mEq/L (ref 135–145)

## 2011-04-18 NOTE — Op Note (Signed)
[name], [name]               ACCOUNT NO.:  [id number]   MEDICAL RECORD NO.:  [id number]          PATIENT TYPE:  AMB   LOCATION:  SDS                          FACILITY:  [facility]   PHYSICIAN:  Theotis Burrow IV, MDDATE OF BIRTH:  1946/08/21   DATE OF PROCEDURE:  12/06/2007  DATE OF DISCHARGE:                               OPERATIVE REPORT   PREOPERATIVE DIAGNOSIS:  End-stage renal disease.   POSTOPERATIVE DIAGNOSIS:  End-stage renal disease.   PROCEDURES PERFORMED:  1. Right upper arm angiogram.  2. Central venogram.  3. Ultrasound guided right brachial artery access.  4. Ultrasound guided right brachial vein access.   INDICATIONS:  This is a 65 year old female who has undergone a right  cephalic vein fistula that has now matured.  She comes in today for  further evaluation.   DESCRIPTION OF PROCEDURE:  The patient was identified in the holding  area and taken to room #7.  She was placed supine on the table.  The  right arm was prepped and draped in a standard sterile fashion.  Using  ultrasound, the fistula was evaluated.  There was no obvious  arterialized vein.  Therefore, I elected to gain access of the artery.  The right brachial artery was evaluated with ultrasound and found to be  widely patent.  Lidocaine 1% was used for local anesthesia.  Using  ultrasound and a micropuncture needle, the right brachial artery was  accessed.  A 1.8 wire was advanced in a retrograde fashion and a  micropuncture sheath was placed.  Contrast injections were performed  through the micropuncture sheath with distal occlusion of the brachial  artery.   FINDINGS:  Brachial artery is widely patent.  There was no evidence of  the cephalic vein fistula.  Therefore, it is presumed occluded.  The  sheath was left in place to be pulled at later time.   After the above images, I elected to perform central venogram to  evaluate for future access given that the patient dialyzes through a  right-sided catheter.  Veins in the forearm were evaluated and found to  be diminutive.  Therefore, I elected to access the brachial vein under  ultrasound guidance.  Lidocaine 1% was used.  The brachial vein was  evaluated and found to be widely patent.  Using a micropuncture needle,  the right brachial vein was accessed under ultrasound visualization and  a 1.8 wire was advanced without difficulty, and a micropuncture sheath  was placed.  Through the sheath, contrast injections were performed.   FINDINGS:  There is no hemodynamically significant central stenosis.  There is an area of luminal narrowing just proximal to where the  catheter is visualized through the vein.  There are no collaterals in  this region to suggest this is a hemodynamically significant lesion.   The patient was taken to the holding area for a sheath pull.  There were  no complications.   IMPRESSION:  1. Occluded right cephalic vein fistula.  2. No central stenosis.           ______________________________  V. Wells Brabham IV,  MD  Electronically Signed     VWB/MEDQ  D:  12/06/2007  T:  12/06/2007  Job:  [id number]

## 2011-04-18 NOTE — Op Note (Signed)
[name], [name]               ACCOUNT NO.:  [id number]   MEDICAL RECORD NO.:  [id number]          PATIENT TYPE:  AMB   LOCATION:  SDS                          FACILITY:  [facility]   PHYSICIAN:  Theotis Burrow IV, MDDATE OF BIRTH:  January 14, 1946   DATE OF PROCEDURE:  12/20/2007  DATE OF DISCHARGE:                               OPERATIVE REPORT   PREOPERATIVE DIAGNOSIS:  End-stage renal disease.   POSTOPERATIVE DIAGNOSIS:  End-stage renal disease.   PROCEDURE PERFORMED:  Right forearm loop graft.   TYPE OF ANESTHESIA:  MAC.   BLOOD LOSS:  50 mL.   FINDINGS:  Small artery.   PROCEDURE:  The patient was identified in the holding area and taken to  room #8.  She was placed supine on the table.  The right arm was prepped  and draped in the standard sterile fashion.  Ultrasound was used to  identify the basilic vein, above the elbow, which appeared to be of  adequate size.  A transverse incision was made above the antecubital  fossa.  Bovie cautery was used to dissect the subcutaneous tissue.  The  fascia was divided with Bovie cautery, and the artery was identified and  mobilized proximally and distally.  Next, a transverse incision was made  above the antecubital fossa over the basilic vein.  Cautery was used to  dissect the subcutaneous tissue.  The vein was then identified and  encircled with the vessel loop.  It was mobilized proximally and  distally.  Next, I created a tunnel for the loop graft.  A counter  incision was made mid arm.  A straight tunneler was used to create the  tunnel and then a 4 x 7 graft was brought through the tunnel.  At this  point in time, the patient was systemically heparinized.  First, the  arterial anastomosis was created.  The artery was occluded with vascular  clamps.  A #11-blade was used to make an arteriotomy.  This was extended  with Potts scissors.  The graft was beveled to fit the size of the  arteriotomy and the anastomosis was created using  a running 6-0 Prolene.  One repair stitch was required for hemostasis.  There was pulsatile flow  within the graft.  Next, the venous anastomosis was created.  The  basilic vein was occluded proximally and distally and opened up with a  #11 blade.  The venotomy was extended with Potts scissors.  An end-to-  side anastomosis was then created after the graft was appropriately  beveled.  This was done using a 6-0 Prolene.  Prior to completion, the  graft was flushed.  Next, I evaluated the radial artery with the  Doppler, and it had a good signal.  There was also a good signal from  the outflow vein.  At this point, protamine was given, 15 mg.  The  wounds were then irrigated.  The deep tissue was reapproximated with 3-0  Vicryl.  The skin was closed with 4-0 Vicryl and Dermabond was placed.  The patient tolerated the procedure well.  There were no complications.  ______________________________  V. Leia Alf, MD  Electronically Signed    VWB/MEDQ  D:  12/20/2007  T:  12/20/2007  Job:  [id number]

## 2011-04-18 NOTE — Op Note (Signed)
[name], NIKOLIC               ACCOUNT NO.:  [id number]   MEDICAL RECORD NO.:  [id number]          PATIENT TYPE:  AMB   LOCATION:  SDS                          FACILITY:  [facility]   PHYSICIAN:  Theotis Burrow IV, MDDATE OF BIRTH:  10-25-1946   DATE OF PROCEDURE:  04/25/2008  DATE OF DISCHARGE:                               OPERATIVE REPORT   PREOPERATIVE DIAGNOSIS:  Thrombosed right forearm dialysis graft.   POSTOPERATIVE DIAGNOSIS:  Thrombosed right forearm dialysis graft.   PROCEDURE PERFORMED:  Thrombectomy and revision of right forearm graft.   ANESTHESIA:  MAC.   ESTIMATED BLOOD LOSS:  100 mL.   FINDINGS:  Adequate inflow, basilic vein was approximately 4 mm.   PROCEDURE IN DETAIL:  The patient was identified in the holding area and  taken to room 6.  She was placed supine on the table.  The right arm was  prepped and draped in standard sterile fashion.  A time-out was called  and perioperative antibiotics were administered.  1% lidocaine was used  for local anesthesia.  After local anesthesia was administered, the  patient's medial arm incision was opened with a #10 blade.  Cautery was  used to dissect through the subcutaneous tissue and identify the graft  as at the level of the anastomosis to the basilic vein.  The graft was  circumferentially mobilized as well as the basilic vein.  There appeared  to be stenosis within the basilic vein, a distance which was  approximately 2 cm.  So, the vein was then exposed further proximal.  At  this time, I felt like the basilic vein was back to its normal caliber.  At this time, the patient was systemically heparinized.  The anastomosis  was taken down with a #11 blade.  A #3 Fogarty catheter was used to  perform thrombectomy of the arterial limb first.  Once adequate inflow  was established and the arterial plug was removed, I then advanced to a  #4 Fogarty across the arterial anastomosis and encountered acceptable  bleeding.   The graft was then flushed with heparinized saline and  reoccluded.  I selected a 6-mm graft to service the interposition.  This  was initially a 4 x 7 graft but I felt that the diameter of the basilic  vein would be best suited for a 6-mm graft.  The ends of the graft were  both beveled and a running anastomosis was created using a 6-0 Prolene.  Once the anastomosis was completed, clamps were released and there was  good flow through the graft.   The graft was then reoccluded after being flushed with heparinized  saline.  Next, a disease segment of the basilic vein was transected and  removed.  I then performed an end-to-end anastomosis between the basilic  vein and the 6-mm Gore-Tex graft.  Prior to completion of the graft, the  basilic vein had been thrombectomized with a 4-mm Fogarty with  acceptable back bleeding.  The clamps on the graft were released and  appropriately flushed.  Anastomosis was then secured.  All the clamps  were then  released.  There was a palpable thrill within the forearm  graft.  25 mg of protamine were given.  Hemostasis was achieved.  The  wounds were closed with a running 3-0 Vicryl and 4-0 Vicryl in the skin.  Dermabond was placed.  This patient tolerated the procedure well and was  taken to the recovery room in stable condition.           ______________________________  V. Leia Alf, MD  Electronically Signed     VWB/MEDQ  D:  04/26/2008  T:  04/26/2008  Job:  [id number]

## 2011-04-18 NOTE — Procedures (Signed)
DIALYSIS GRAFT DUPLEX EVALUATION   INDICATION:  Decreased flow in the right AV graft.   HISTORY:  End-stage renal disease, right arm AV graft with multiple interventions.   DUPLEX:                                   Duplex Velocities  Inflow artery                   626 cm/s  Inflow anastomosis              694 cm/s  Mid arterial limb               299 cm/s  Mid graft                       210 cm/s  Mid venous limb                 215 cm/s  Outflow anastomosis             225 cm/s  Outflow vein                    212 cm/s   IMPRESSION:  1. Patent right arm arteriovenous graft.  2. Increased velocities noted in the right proximal native vessel and      anastomosis regions at the antecubital fossa level with no focal      narrowing of the internal lumens.  3. Additional Doppler velocities noted on the attached work sheet.   ___________________________________________  V. Leia Alf, MD   CH/MEDQ  D:  02/14/2010  T:  02/15/2010  Job:  [id number]

## 2011-04-18 NOTE — Consult Note (Signed)
[name], [name]              ACCOUNT NO.:  [id number]   MEDICAL RECORD NO.:  [id number]          PATIENT TYPE:  INP   LOCATION:  5530                         FACILITY:  [facility]   PHYSICIAN:  Champ Mungo. Lovena Le, MD    DATE OF BIRTH:  11-02-1946   DATE OF CONSULTATION:  10/31/2007  DATE OF DISCHARGE:                                 CONSULTATION   CARDIOLOGY CONSULTATION   INDICATION FOR CONSULTATION:  Evaluation of borderline elevation in  troponins in the setting of mild LV dysfunction.   HISTORY OF PRESENT ILLNESS:  The patient is a 65 year old woman who was  admitted to the hospital with acute on chronic volume overload and a  creatinine of 10.  She has known renal failure and is status-post  fistula placement back in August of this year.  She subsequently  traveled to [place] in September; and at that time she had  planned to stay until the early December time when she planned to return  back to [place] and follow up with her physicians.  However, the  patient several days ago developed worsening shortness of breath, which  actually got worse over a 2-week period, when she presented to the  hospital with acute on chronic volume overload.  Her BNP was greater  than 3200.  Her creatinine was 10 as noted.  She was also anemic.  She  was dialyzed and over the last several days has gradually felt better.  Urinalysis is notable for marked proteinemia.  Her chest x-ray  demonstrated heart failure.  While she has been in the hospital, she has  been dialyzed with a Diatek catheter.  Her heart failure symptoms have  improved.  Additional evaluation demonstrated borderline troponins as  previously mentioned, and recent 2-D echo demonstrated LV dysfunction  with an EF of 45 to 50%.  The patient denies chest pain.  She denies  syncope.  She denies sudden onset of shortness of breath.  There  basically are no anginal symptoms.   FAMILY HISTORY:  Notable for mother died of  complications of heart  failure.  Her father died of complications of cancer and had diabetes.   SOCIAL HISTORY:  The patient lives alone, but she has 3 children.  She  is disabled.  She has a 10-year history of HIV infection.  She denies  tobacco or alcohol abuse.   REVIEW OF SYSTEMS:  Really negative except as noted in the HPI.  She has  an occasional dry cough.   PHYSICAL EXAMINATION:  GENERAL:  She is a pleasant 65 year old woman in  no acute distress.  VITAL SIGNS:  Blood pressure today is 136/75.  The pulse is 77 and  regular.  The respirations are 18.  Temperature is 98.3.  HEENT EXAM:  Normocephalic and atraumatic.  Pupils equal and round.  Oropharynx is moist.  The sclerae are anicteric.  NECK:  Reveals no jugular venous distention.  No thyromegaly.  The  trachea is midline.  The carotids are 2+ and symmetric.  LUNGS:  Clear bilaterally to auscultation.  No wheezes, rales, or  rhonchi are  present.  There is no increased work of breathing.  CARDIOVASCULAR EXAM:  Regular rate and rhythm with normal S1 and S2.  There is a soft S4 gallop present.  The PMI is not enlarged or laterally  displaced.  ABDOMINAL EXAM:  Soft, nontender, and nondistended.  There is no  organomegaly.  Bowel sounds are present.  There is no rebound or  guarding.  EXTREMITIES:  Demonstrate no cyanosis, clubbing, or edema.  The pulses  are 2+ and symmetric.  NEUROLOGIC EXAM:  Alert and oriented x3 with cranial nerves intact.  Strength is 5/5 and symmetric.   EKG demonstrates sinus rhythm with normal axis and intervals.   IMPRESSION:  1. Congestive heart failure secondary to volume overload in the      setting of mild left ventricular systolic dysfunction.  2. Positive troponins in the setting of borderline elevation and in      the setting of volume overload and end-stage renal disease are of      no clinical significance.  3. Human immunodeficiency virus (HIV) infection.  4. Diabetes.    DISCUSSION:  For now, I agree with continuation of her beta-blocker, her  low sodium diet, and her other antiretroviral therapies and such.  I do  agree with no additional cardiac workup at this time.  Please continue  her present medical therapy, and let us know if she has symptoms of  chest pain or other cardiovascular findings.      Champ Mungo. Lovena Le, MD  Electronically Signed     GWT/MEDQ  D:  10/31/2007  T:  10/31/2007  Job:  [id number]

## 2011-04-18 NOTE — Discharge Summary (Signed)
[name], [name]              ACCOUNT NO.:  [id number]   MEDICAL RECORD NO.:  [id number]          PATIENT TYPE:  INP   LOCATION:  5530                         FACILITY:  [facility]   PHYSICIAN:  Doree Albee, M.D.DATE OF BIRTH:  1946/11/16   DATE OF ADMISSION:  10/26/2007  DATE OF DISCHARGE:                               DISCHARGE SUMMARY   CURRENT DIAGNOSES:  1. Chronic kidney disease/end-stage renal disease, on hemodialysis.  2. Congestive heart failure with a slightly reduced ejection fraction      of 45-50% with diffuse left ventricular wall hypokinesis.  3. Diabetes mellitus.  4. Hypertension.  5. Human immunodeficiency virus disease.  6. Hepatitis C positive.   CONDITION AT PRESENT:  Clinically stabilizing.   MEDICATIONS ON DISCHARGE:  To be determined.   HISTORY:  This 65 year old lady came in with a history of dyspnea and  was found to be in acute pulmonary edema and was found to have an acute  renal failure with a creatinine of about approximately 10.  Please see  initial history and physical examination done by Dr. __________.   HOSPITAL PROGRESS:  The patient was seen and immediately Nephrology was  consulted.  They felt that clearly she needed hemodialysis and arranged  for an interventional radiologist to place a hemodialysis catheter which  was done on October 28, 2007.  She then proceeded to start having  dialysis on October 29, 2007.  In the meantime, she was controlled with  intravenous Lasix in terms of volume overload.  She has improved  significantly since dialysis.  Also, a 2-D echocardiogram was done which  showed that the overall left ventricular systolic function was mildly  decreased with an ejection fraction estimated in the range of 45-50%.  There was mild diffuse left ventricular wall hypokinesis.  She is a lady  who lives in [place] and is debating whether she needs to move down to  this area permanently.  In the meantime, she is going to be  staying in  the hospital until she is stable from the renal point of view.  Today  she is doing much better with no shortness of breath or chest pain.   PHYSICAL EXAMINATION:  VITAL SIGNS:  Temperature 98.3, blood pressure  136/75, pulse 77, saturations 99% on room air.  CARDIOVASCULAR:  Heart sounds are present and normal. LUNG FIELDS:  Clear.   Investigations today show a sodium of 138, potassium 3.6, bicarbonate  27, BUN 17, creatinine 5.4, hemoglobin 10.5, white blood cell count 9.0,  platelets 125.   FURTHER DISPOSITION:  I think a Cardiology consultation should be  considered in view of her hypokinesis and to see whether she has 3-  vessel disease which might be amenable to CABG.  Otherwise, she is  becoming more stable and can be discharged home once Nephrology clears  her.      Doree Albee, M.D.  Electronically Signed     NCG/MEDQ  D:  10/31/2007  T:  10/31/2007  Job:  [id number]

## 2011-04-18 NOTE — Assessment & Plan Note (Signed)
OFFICE VISIT   [name], [name]  DOB:  [birth year]                                       03/16/2008  [id number]   Patient was sent to me to evaluate her for [name] fistula in her left arm.  Patient has [name] functional right forearm loop graft.   At this time, I see no reason to evaluate her left arm, as I would not  perform [name] fistula until her graft is no longer functional.  I have  elected not to vein map her today.   She will follow up p.r.n.   This is [name] no-charge visit.   Eldridge Abrahams, MD  Electronically Signed   VWB/MEDQ  D:  03/16/2008  T:  03/17/2008  Job:  [id number]

## 2011-04-18 NOTE — Assessment & Plan Note (Signed)
OFFICE VISIT   MISTICA, DEICHMANN  DOB:  1946/07/16                                       11/18/2007  [id number]   REASON FOR VISIT:  Evaluate fistula.   HISTORY:  This is a 65 year old female with end-stage renal disease who  recently had a right cephalic vein fistula placed in July 2008. This has  not matured. She currently dialyzes through a right-sided catheter. She  comes in today for evaluation of her fistula. It has been accessed three  times, but it has not matured very well.   REVIEW OF SYSTEMS:  GENERAL: No weight gain, weight loss. CARDIAC:  Negative. PULMONARY: Negative. GI: Negative. GU: Positive for end-stage  renal disease/kidney disease. VASCULAR: Negative. NEURO: Positive for  headaches. ORTHO: Negative. PSYCH: Negative. ENT: Negative. HEME:  Negative.   PAST MEDICAL HISTORY:  1. HIV.  2. Diabetes.  3. Hypertension.  4. Hypercholesterolemia.   PAST SURGICAL HISTORY:  Right cephalic vein fistula in July 2008 in [place]. A hemodialysis catheter 2 weeks ago. Cholecystectomy. Total  abdominal hysterectomy.   FAMILY HISTORY:  Negative.   SOCIAL HISTORY:  Single with 3 children. She is retired. She has history  of smoking, quit in 1997. She does not drink.   MEDICATIONS:  Include amlodipine, Crestor, Bactrim, Lantus, metoprolol,  PhosLo, Zerit, Ziagen, Sustiva.   PHYSICAL EXAMINATION:  VITAL SIGNS: Heart rate 72, respirations 18,  blood pressure 189/100.  GENERAL: She is well-appearing in no acute distress.  HEENT: She is normocephalic, atraumatic. Pupils are equal and round.  Sclerae are anicteric.  CARDIOVASCULAR: Regular rate and rhythm without murmur, rubs or gallops.  RESPIRATION: Clear to auscultation bilaterally.  EXTREMITIES: Right arm has a cephalic vein fistula that has a good  thrill for approximately 3 cm and then disappears. She has a palpable  right radial pulse.  NEURO: Cranial nerves 2 through 12 are grossly  intact.  PSYCH: She is alert and oriented times three.  SKIN: There is no rash.   ASSESSMENT:  Status post end-stage renal disease with right-sided non  maturing cephalic vein fistula.   PLAN:  Plan on performing a fistulagram with access of the proximal  portion of the fistula to evaluate why this is not maturing. I think it  is likely that this will need to be converted to a graft. However, we  will attempt to study it first. This has been scheduled for January 2nd.   Eldridge Abrahams, MD  Electronically Signed   VWB/MEDQ  D:  11/18/2007  T:  11/19/2007  Job:  [id number]

## 2011-04-18 NOTE — Discharge Summary (Signed)
[name], [name]              ACCOUNT NO.:  [id number]   MEDICAL RECORD NO.:  [id number]          PATIENT TYPE:  INP   LOCATION:  [id number]                         FACILITY:  [facility]   PHYSICIAN:  Edythe Lynn, M.D.       DATE OF BIRTH:  15-Apr-1946   DATE OF ADMISSION:  10/26/2007  DATE OF DISCHARGE:  11/08/2007                               DISCHARGE SUMMARY   PRIMARY CARE PHYSICIAN:  Will be [employer].   DISCHARGE DIAGNOSIS:  For the list of discharge diagnoses, refer to  previously dictated discharge summary done by Dr. Anastasio Champion.   DISCHARGE MEDICATIONS:  1. Crestor 20 mg daily.  2. Ziagen 300 mg twice a day.  3. Zerit 15 mg daily.  4. Norvasc 10 mg daily.  5. Sustiva 600 mg at bedtime.  6. Bactrim Double Strength 2 tablets Monday, Wednesday, Friday.  7. Lantus 12 units at bedtime.  8. PhosLo 667 mg 1 pill 3 times a day.  9. Nephro-Vite 1 tablet daily.  10.Hydroxyzine as needed for itching.   CONDITION ON DISCHARGE:  Ms. [name] is discharged in good condition.   The patient will follow up with the infectious disease clinic at [facility] on November 12, 2007 at 9:00 a.m.  She will also follow up  with [facility] for hemodialysis Tuesday, Thursday and  Saturday at 11:30 a.m.  The patient is instructed on a renal diet and  fluid restriction.   For the complete list of procedures and consultations, refer to the  previously dictated discharge summary done by Dr. Anastasio Champion done on  October 31, 2007.  Since that day, Ms. [name] has remained fairly  stable.  She had an AV fistulogram which was done by the vascular  surgery service.  The patient is scheduled to follow up with vascular  surgery for redo of her fistula.  The patient underwent vein mapping to  help in that procedure.  Ms. [name] was continued on hemodialysis on a  daily basis, and then she was switched to scheduled Tuesday, Thursday,  Saturday hemodialysis.  Currently she is  tolerating the procedure  without any further complications.  The patient is currently euvolemic  and her congestive heart failure has resolved.  The patient's blood  pressure has been easier to control, and she was able to discontinue a  couple of her antihypertensives.  The patient has been receiving  treatment for her anemia with Aranesp and INFeD per the nephrology  service.  The patient's diabetes is stable on a single dose of  Lantus at bedtime.  [name]'s HIV medications have been continued,  and her measured viral load is 7,800 and the CD-4 count is 180.  The  patient will follow up with Dr. Ola Spurr from infectious diseases on  December 9 at 9:00 a.m.      Edythe Lynn, M.D.  Electronically Signed     SL/MEDQ  D:  11/08/2007  T:  11/08/2007  Job:  [id number]   cc:   Leonel Ramsay, MD  [facility]

## 2011-06-01 ENCOUNTER — Telehealth: Payer: Self-pay | Admitting: *Deleted

## 2011-06-01 NOTE — Telephone Encounter (Signed)
rec'd a fax asking for diabetic shoes. LM . Need to know who cares for her diabtes or if this md signs for this

## 2011-06-08 NOTE — Telephone Encounter (Signed)
Faxed signed form back for her diabetic shoes

## 2011-06-08 NOTE — Telephone Encounter (Signed)
I signed the form they sent [place]. I dont know if she has a PCP

## 2011-06-19 NOTE — Discharge Summary (Signed)
[name], [name]               ACCOUNT NO.:  [id number]  MEDICAL RECORD NO.:  [id number]           PATIENT TYPE:  I  LOCATION:  [id number]                         FACILITY:  [facility]  PHYSICIAN:  Alison Murray, M.D.  DATE OF BIRTH:  03/05/1946  DATE OF ADMISSION:  03/21/2011 DATE OF DISCHARGE:  03/27/2011                              DISCHARGE SUMMARY   DISCHARGE DIAGNOSES: 1. Gastrointestinal bleed. 2. Hypertension. 3. Human immunodeficiency virus. 4. End-stage renal disease.  DISCHARGE MEDICATIONS: 1. Aranesp 200 mcg injection IV on Saturday with hemodialysis. 2. Crestor 20 mg tablets take 1 tablet daily by mouth. 3. Cinacalcet 90 mg take 1 tablet daily by mouth. 4. Epivir 10 mg/mL take a half teaspoon by mouth twice daily. 5. Lanthanum 500 mg take 1 tablet 3 times daily before meals. 6. Lisinopril 40 mg take 1 tablet daily by mouth. 7. Amlodipine 5 mg take 1 tablet daily by mouth at bed time. 8. Omeprazole 20 mg tablets take 1 tablet daily by mouth. 9. PhosLo 667 mg take 2 capsules by mouth 3 times daily with meals. 10.Renal vitamin 1 tablet daily by mouth. 11.Sustiva 600 mg tablets take 1 tablet daily by mouth. 12.Abacavir 300 mg take 1 tablet daily by mouth twice daily.  DISPOSITION AND FOLLOWUP:  Ms. Haffner was discharged from [facility] in stable and improved condition.  She will continue to follow up with her at [facility] on Tuesday, Thursday, Saturday for her usual end-stage renal disease dialysis.  She will also have a follow up with Dr. Benson Norway of GI on Apr 10, 2011, at 9:15 in the morning. At her next dialysis, she should have a CBC done to monitor her hemoglobin from this admission.  CONSULTATIONS:  GI and Renal.  PROCEDURES PERFORMED.: 1. Chest x-ray which showed no evidence of acute pulmonary disease. 2. EGD on March 22, 2011, by Dr. Benson Norway, which showed a sessile polyp     and nonbleeding duodenal AVM, hiatal hernia, small mucosal  esophageal diverticula. 3. Colonoscopy performed on March 23, 2011, by Dr. Benson Norway, which showed     a normal colon with internal and external hemorrhoids. 4. Small-bowel capsule endoscopy, which showed ulcer in the second     portion of the duodenum, no bleeding that was at the suspected post     polypectomy site, inflammation of the GE junction suspected for     scope trauma, 2-cm hiatal hernia diverticula and the total     esophagus mucosal skin changes and the proximal esophagus seen on     exit and entry again suspected for scope trauma, otherwise normal     exam.  ADMISSION HISTORY OF PRESENT ILLNESS:  [name] is a 65 year old woman with past medical history of HIV, HCV, hypertension and renal failure, who presented with a 2-week history of weakness and dark stools.  States for the past 2 weeks, she had been feeling tired and it is hard for her to breathe or walk.  She attributed symptoms to possible cold that she had and also had stuffy nose and cough.  The cough was productive with increased amounts  of white phlegm.  She denies any fever, but states that she has had some night sweats during that time.  She previously diagnosed with iron deficiency anemia and has not been taking irons recently because her "numbers were fine."  Yesterday, while walking at a brisk pace, she also states that she felt some heaviness in her chest like someone was sitting on it.  The heaviness was located in the middle of her chest and stopped as soon as she stopped at rest.  She denies any diaphoresis or radiating pain.  In regards to her dark stool, she states that is primarily a normal consistency with an occasional loose stool that she does not classifies diarrhea.  She attributed this change in color to the fact that she had recently been eating colored grains.  She has not had any bright red blood in her stool, though occasionally when she wipes she sees blood.  The patient states that she  believes this blood was due to her hemorrhoids.  FOBT was  performed in the ED was positive.  Her last colonoscopy was approximately 10 years ago which was normal.  She had scheduled for her next one in 2014.  ADMISSION PHYSICAL EXAM:  VITAL SIGNS:  No vital signs were recorded for this admission. GENERAL APPEARANCE:  Alert, cooperative female in no acute distress. HEENT:  There was pallor in her sclera. NECK:  Showed no adenopathy, carotid bruit, or JVD.  Neck was supple, symmetric.  Trachea was midline.  Thyroid was not enlarged. LUNGS:  Clear to auscultation bilaterally. HEART:  Regular rate and rhythm.  Normal S1 and S2 with systolic murmur with no clicks or gallops or rubs. ABDOMEN:  Soft, nontender.  Bowel sounds normal.  No masses, organomegaly.  Mild epigastric tenderness. RECTAL:  Showed no external abnormality like such as hemorrhoids or masses, normal-appearing stool, and FOBT positive. EXTREMITIES:  Normal, atraumatic.  No cyanosis or edema.  Pulses were 2+ and symmetric. NEUROLOGIC:  Grossly intact to examination.  ADMISSION LABORATORIES:  White count was 6.0, hemoglobin was 6.4, hematocrit was 18.8, platelets were 184, MCV was 87.  Sodium was 137, potassium was 3.7, chloride was 98, bicarb was 33, BUN was 18, creatinine was 3.86, glucose was 123, calcium was 8.9, AST was 57, ALT was 47, alk phos was 107, bilirubin was 0.5, protein was 7.8 and albumin was 3.9.  PT/INR was 0.87 and  point-of-care cardiac markers x1 were negative.  HOSPITAL COURSE BY PROBLEM LIST: 1. GI bleed.  On presentation, the patient's hemoglobin was 6.0 and     she is an end-stage renal patient, so GI was consulted and EGD and     colonoscopy were done by Dr. Benson Norway.  Did find a gastric polyp as     noted above in the endoscopy report, but no active bleeding site     was seen.  There was also an AVM that was noted in the farthest     distal portion of the duodenum that was seen that was not  bleeding     at the time of EGD.  Colonoscopy was normal.  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  She was     transfused a total of 4 units of packed RBCs and her hemoglobin on     the day of discharge was stable  at 8.7.  She will need a continued     follow up to follow the course of her hemoglobin.  She was also     restarted on Aranesp and iron during this stay to be followed at     her dialysis treatments to help maintain her hemoglobin at an     acceptable level. 2. Hypertension.  On admission, her antihypertensives were held     because of her GI bleed.  She was maintained throughout her stay,     managed closely with her blood pressure by fluid removal at her     dialysis treatments and discharged.  She was discharged on her home     doses of lisinopril 40 mg and Norvasc 5 mg with continued follow up     during her dialysis sessions. 3. HIV disease.  She was kept on her home medications of Sustiva,     abacavir, and Epivir throughout her stay.  She is followed by Dr.     Tommy Medal at the [facility] for Infectious Disease and her CD-4     count in February was 230, so she appears to be relatively well-     controlled for her HIV. 4. End-stage renal disease.  Renal was consulted on admission and she     was maintained on her home Tuesday, Thursday, Saturday schedule for     dialysis.  She appeared to tolerate this very well throughout her     stay and was discharged back to her on her own home schedule. 5. Transaminitis.  On admission, CMET noted there was a slight     elevation and her AST and her ALT.  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  Looking back in her     outpatient chart, her transaminitis appeared to be near at her     baseline during her stay and was followed one more time during this     stay which had maintained  as a stable at that time.  DISCHARGE VITAL SIGNS:  Temperature was 98.7, blood pressure was 148/75, pulse was 74, respirations 18, saturating 94% on room air.  DISCHARGE LABORATORY DATA:  White count was 6.5, hemoglobin was 8.7, hematocrit was 25.6, platelets were 110.  Sodium was 138, potassium was 3.9, chloride was 101, bicarb was 29, BUN was 41, creatinine 7.30, blood sugar was 156, calcium was 8.8.    ______________________________ Trish Fountain, MD   ______________________________ Alison Murray, M.D.    CP/MEDQ  D:  04/26/2011  T:  04/27/2011  Job:  [id number]  cc:   Alcide Evener, MD Joyice Faster. Deterding, M.D.  Electronically Signed by Trish Fountain MD on 04/27/2011 11:36:52 AM Electronically Signed by Lars Mage M.D. on 06/19/2011 03:23:26 PM

## 2011-07-31 ENCOUNTER — Other Ambulatory Visit: Payer: Medicare Other

## 2011-07-31 ENCOUNTER — Other Ambulatory Visit: Payer: Self-pay

## 2011-07-31 DIAGNOSIS — IMO0001 Reserved for inherently not codable concepts without codable children: Secondary | ICD-10-CM

## 2011-07-31 DIAGNOSIS — B20 Human immunodeficiency virus [HIV] disease: Secondary | ICD-10-CM

## 2011-07-31 DIAGNOSIS — Z79899 Other long term (current) drug therapy: Secondary | ICD-10-CM

## 2011-07-31 DIAGNOSIS — Z113 Encounter for screening for infections with a predominantly sexual mode of transmission: Secondary | ICD-10-CM

## 2011-07-31 LAB — COMPLETE METABOLIC PANEL WITH GFR
Albumin: 3.9 g/dL (ref 3.5–5.2)
BUN: 75 mg/dL — ABNORMAL HIGH (ref 6–23)
CO2: 18 mEq/L — ABNORMAL LOW (ref 19–32)
Calcium: 10.1 mg/dL (ref 8.4–10.5)
Chloride: 104 mEq/L (ref 96–112)
GFR, Est African American: 5 mL/min — ABNORMAL LOW (ref >60)
GFR, Est Non African American: 4 mL/min — ABNORMAL LOW (ref >60)
Glucose, Bld: 87 mg/dL (ref 70–99)
Potassium: 5.4 mEq/L — ABNORMAL HIGH (ref 3.5–5.3)
Sodium: 138 mEq/L (ref 135–145)
Total Protein: 7.6 g/dL (ref 6.0–8.3)

## 2011-07-31 LAB — CBC WITH DIFFERENTIAL/PLATELET
Basophils Relative: 1 % (ref 0–1)
HCT: 38.3 % (ref 36.0–46.0)
Hemoglobin: 12.5 g/dL (ref 12.0–15.0)
Lymphocytes Relative: 28 % (ref 12–46)
Lymphs Abs: 1.8 10*3/uL (ref 0.7–4.0)
MCHC: 32.6 g/dL (ref 30.0–36.0)
Monocytes Absolute: 0.4 10*3/uL (ref 0.1–1.0)
Monocytes Relative: 6 % (ref 3–12)
Neutro Abs: 3.7 10*3/uL (ref 1.7–7.7)
RBC: 4.29 MIL/uL (ref 3.87–5.11)

## 2011-07-31 LAB — RPR: RPR Ser Ql: REACTIVE — AB

## 2011-07-31 LAB — RPR TITER: RPR Titer: 1:16 {titer} — AB

## 2011-07-31 LAB — LIPID PANEL
LDL Cholesterol: 91 mg/dL (ref 0–99)
VLDL: 21 mg/dL (ref 0–40)

## 2011-07-31 NOTE — Telephone Encounter (Signed)
Medications called to [facility]. Per pt request  , [place]  [place] [id number] ID # [id number] Orland Mustard

## 2011-08-01 LAB — T-HELPER CELL (CD4) - (RCID CLINIC ONLY): CD4 % Helper T Cell: 17 % — ABNORMAL LOW (ref 33–55)

## 2011-08-01 LAB — GC/CHLAMYDIA PROBE AMP, URINE: Chlamydia, Swab/Urine, PCR: NEGATIVE

## 2011-08-02 LAB — HIV-1 RNA QUANT-NO REFLEX-BLD: HIV-1 RNA Quant, Log: <1.3 {Log} (ref <1.30)

## 2011-08-02 MED ORDER — EFAVIRENZ 600 MG PO TABS: 600.0000 mg | ORAL_TABLET | Freq: Every day | ORAL | Status: DC

## 2011-08-02 MED ORDER — LAMIVUDINE 10 MG/ML PO SOLN: 25.0000 mg | Freq: Every day | ORAL | Status: DC

## 2011-08-02 MED ORDER — ROSUVASTATIN CALCIUM 20 MG PO TABS: 20.0000 mg | ORAL_TABLET | Freq: Every day | ORAL | Status: DC

## 2011-08-09 ENCOUNTER — Other Ambulatory Visit: Payer: Self-pay | Admitting: *Deleted

## 2011-08-09 DIAGNOSIS — B20 Human immunodeficiency virus [HIV] disease: Secondary | ICD-10-CM

## 2011-08-09 MED ORDER — ABACAVIR SULFATE 300 MG PO TABS: 300.0000 mg | ORAL_TABLET | Freq: Two times a day (BID) | ORAL | Status: DC

## 2011-08-14 ENCOUNTER — Ambulatory Visit (INDEPENDENT_AMBULATORY_CARE_PROVIDER_SITE_OTHER): Payer: Medicare Other | Admitting: Infectious Disease

## 2011-08-14 ENCOUNTER — Encounter: Payer: Self-pay | Admitting: Infectious Disease

## 2011-08-14 VITALS — BP 167/86 | HR 76 | Temp 98.8°F | Ht 63.0 in | Wt 137.0 lb

## 2011-08-14 DIAGNOSIS — Z113 Encounter for screening for infections with a predominantly sexual mode of transmission: Secondary | ICD-10-CM

## 2011-08-14 DIAGNOSIS — B20 Human immunodeficiency virus [HIV] disease: Secondary | ICD-10-CM

## 2011-08-14 DIAGNOSIS — A539 Syphilis, unspecified: Secondary | ICD-10-CM

## 2011-08-14 DIAGNOSIS — I10 Essential (primary) hypertension: Secondary | ICD-10-CM

## 2011-08-14 DIAGNOSIS — R110 Nausea: Secondary | ICD-10-CM

## 2011-08-14 MED ORDER — ONDANSETRON HCL 4 MG PO TABS: 4.0000 mg | ORAL_TABLET | Freq: Two times a day (BID) | ORAL | Status: DC | PRN

## 2011-08-14 NOTE — Assessment & Plan Note (Signed)
Less well controlled off of her lisinopril

## 2011-08-14 NOTE — Assessment & Plan Note (Signed)
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

## 2011-08-14 NOTE — Assessment & Plan Note (Signed)
Sensory starting her medication she has had some nausea but no emesis will give her some Zofran to use as needed

## 2011-08-14 NOTE — Progress Notes (Signed)
Subjective:    Patient ID: [name], female    DOB: 03-23-1946, 65 y.o.   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 He returns to clinic for routine care. She states that she had run out of one of her antivirals and has not refilled by her pharmacy. She states that she did not continue the other 2 active agents until she was able to get a third antiviral. She also ran out of her lisinopril and she too persists in being the cause for her elevated blood pressure today. Reviewed all of her laboratory data and found that her are low the still undetectable his CD4 count healthy even after she had been off her medicines for a week. We also reviewed her elevated RPR which is up to a titer of 1-16 from a baseline of 1-4 upon entering our clinic. I offered her a lumbar puncture via interventional radiology but she refused this. I explained the risks of neurosyphilis and how it is treated differently than tertiary syphilis but does not involve the central nervous system. She would prefer to item proceed with empiric treatment for neurosyphilis with intravenous penicillin. This is reasonable and we will arrange for placement of a central line in her neck as well as home IV penicillin. In total we spent greater than 45 minutes with the patient including greater than 50% of time coordinating care and counseling the patient face-to-face.  Review of Systems  Constitutional: Negative for chills, diaphoresis, activity change, appetite change and unexpected weight change.  HENT: Negative for sneezing, trouble swallowing and sinus pressure.   Eyes: Negative for photophobia and visual disturbance.  Respiratory: Negative for chest tightness, wheezing and stridor.   Cardiovascular: Negative for chest pain, palpitations and leg  swelling.  Gastrointestinal: Negative for nausea, abdominal pain, constipation, blood in stool, abdominal distention and anal bleeding.  Genitourinary: Negative for dysuria, hematuria, flank pain and difficulty urinating.  Musculoskeletal: Negative for myalgias, back pain, joint swelling, arthralgias and gait problem.  Skin: Negative for color change, pallor and wound.  Neurological: Negative for dizziness, tremors, weakness and light-headedness.  Hematological: Negative for adenopathy. Does not bruise/bleed easily.  Psychiatric/Behavioral: Negative for behavioral problems, confusion, sleep disturbance, dysphoric mood, decreased concentration and agitation.       Objective:   Physical Exam  Constitutional: She is oriented to person, place, and time. She appears well-developed and well-nourished. No distress.  HENT:  Head: Normocephalic and atraumatic.  Mouth/Throat: Oropharynx is clear and moist. No oropharyngeal exudate.  Eyes: Conjunctivae and EOM are normal. Pupils are equal, round, and reactive to light. No scleral icterus.  Neck: Normal range of motion. Neck supple. No JVD present.  Cardiovascular: Normal rate, regular rhythm and normal heart sounds.  Exam reveals no gallop and no friction rub.   No murmur heard. Pulmonary/Chest: Effort normal and breath sounds normal. No respiratory distress. She has no wheezes. She has no rales. She exhibits no tenderness.  Abdominal: She exhibits no distension and no mass. There is no tenderness. There is no rebound and no guarding.  Musculoskeletal: She exhibits no edema and no tenderness.  Lymphadenopathy:    She has no cervical adenopathy.  Neurological: She is alert and oriented to person, place, and time. She has normal reflexes. She exhibits normal muscle tone. Coordination normal.  Skin:  Skin is warm and dry. She is not diaphoretic. No erythema. No pallor.  Psychiatric: She has a normal mood and affect. Her behavior is normal. Judgment and  thought content normal.          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

## 2011-08-14 NOTE — Assessment & Plan Note (Signed)
Continue current regimen

## 2011-08-16 ENCOUNTER — Other Ambulatory Visit: Payer: Self-pay | Admitting: Infectious Disease

## 2011-08-18 ENCOUNTER — Inpatient Hospital Stay (HOSPITAL_COMMUNITY)
Admission: RE | Admit: 2011-08-18 | Discharge: 2011-08-18 | Payer: Medicare Other | Source: Ambulatory Visit | Attending: Infectious Disease | Admitting: Infectious Disease

## 2011-08-21 ENCOUNTER — Ambulatory Visit (HOSPITAL_COMMUNITY)
Admission: RE | Admit: 2011-08-21 | Discharge: 2011-08-21 | Disposition: A | Discharge status: Home / Self Care | Payer: Medicare Other | Source: Ambulatory Visit | Attending: Nephrology | Admitting: Nephrology

## 2011-08-21 ENCOUNTER — Other Ambulatory Visit (HOSPITAL_COMMUNITY): Payer: Self-pay | Admitting: Nephrology

## 2011-08-21 DIAGNOSIS — N186 End stage renal disease: Secondary | ICD-10-CM

## 2011-08-21 DIAGNOSIS — Y832 Surgical operation with anastomosis, bypass or graft as the cause of abnormal reaction of the patient, or of later complication, without mention of misadventure at the time of the procedure: Secondary | ICD-10-CM | POA: Insufficient documentation

## 2011-08-21 DIAGNOSIS — T82868A Thrombosis of vascular prosthetic devices, implants and grafts, initial encounter: Secondary | ICD-10-CM

## 2011-08-21 DIAGNOSIS — T82898A Other specified complication of vascular prosthetic devices, implants and grafts, initial encounter: Secondary | ICD-10-CM | POA: Insufficient documentation

## 2011-08-21 MED ORDER — IOHEXOL 300 MG/ML  SOLN
100.0000 mL | Freq: Once | INTRAMUSCULAR | Status: AC | PRN
  Administered 2011-08-21: 45 mL via INTRAVENOUS

## 2011-08-24 LAB — POCT I-STAT 4, (NA,K, GLUC, HGB,HCT)
Glucose, Bld: 87
Glucose, Bld: 76
HCT: 43
Hemoglobin: 14.6
Operator id: 181601
Potassium: 5.2 — ABNORMAL HIGH

## 2011-08-28 ENCOUNTER — Other Ambulatory Visit (HOSPITAL_COMMUNITY): Payer: Self-pay | Admitting: Nephrology

## 2011-08-28 ENCOUNTER — Ambulatory Visit (HOSPITAL_COMMUNITY)
Admission: RE | Admit: 2011-08-28 | Discharge: 2011-08-28 | Disposition: A | Discharge status: Home / Self Care | Payer: Medicare Other | Source: Ambulatory Visit | Attending: Nephrology | Admitting: Nephrology

## 2011-08-28 DIAGNOSIS — T82898A Other specified complication of vascular prosthetic devices, implants and grafts, initial encounter: Secondary | ICD-10-CM | POA: Insufficient documentation

## 2011-08-28 DIAGNOSIS — Y832 Surgical operation with anastomosis, bypass or graft as the cause of abnormal reaction of the patient, or of later complication, without mention of misadventure at the time of the procedure: Secondary | ICD-10-CM | POA: Insufficient documentation

## 2011-08-28 DIAGNOSIS — T82868A Thrombosis of vascular prosthetic devices, implants and grafts, initial encounter: Secondary | ICD-10-CM

## 2011-08-28 DIAGNOSIS — N186 End stage renal disease: Secondary | ICD-10-CM | POA: Insufficient documentation

## 2011-08-28 LAB — POTASSIUM: Potassium: 5.5 — ABNORMAL HIGH

## 2011-08-28 MED ORDER — IOHEXOL 300 MG/ML  SOLN
100.0000 mL | Freq: Once | INTRAMUSCULAR | Status: AC | PRN
  Administered 2011-08-28: 80 mL via INTRAVENOUS

## 2011-08-30 ENCOUNTER — Ambulatory Visit (HOSPITAL_COMMUNITY): Payer: Medicare Other

## 2011-08-30 ENCOUNTER — Ambulatory Visit (HOSPITAL_COMMUNITY)
Admission: AD | Admit: 2011-08-30 | Discharge: 2011-08-30 | Disposition: A | Discharge status: Home / Self Care | Payer: Medicare Other | Source: Ambulatory Visit | Attending: Vascular Surgery | Admitting: Vascular Surgery

## 2011-08-30 DIAGNOSIS — N186 End stage renal disease: Secondary | ICD-10-CM

## 2011-08-30 DIAGNOSIS — Z01818 Encounter for other preprocedural examination: Secondary | ICD-10-CM | POA: Insufficient documentation

## 2011-08-30 DIAGNOSIS — Z21 Asymptomatic human immunodeficiency virus [HIV] infection status: Secondary | ICD-10-CM | POA: Insufficient documentation

## 2011-08-30 DIAGNOSIS — E119 Type 2 diabetes mellitus without complications: Secondary | ICD-10-CM | POA: Insufficient documentation

## 2011-08-30 DIAGNOSIS — B1920 Unspecified viral hepatitis C without hepatic coma: Secondary | ICD-10-CM | POA: Insufficient documentation

## 2011-08-30 DIAGNOSIS — I509 Heart failure, unspecified: Secondary | ICD-10-CM | POA: Insufficient documentation

## 2011-08-30 DIAGNOSIS — Z01812 Encounter for preprocedural laboratory examination: Secondary | ICD-10-CM | POA: Insufficient documentation

## 2011-08-30 LAB — POCT I-STAT 4, (NA,K, GLUC, HGB,HCT)
Glucose, Bld: 70
Glucose, Bld: 93 mg/dL (ref 70–99)
HCT: 41 % (ref 36.0–46.0)
HCT: 43
Operator id: 147081
Sodium: 139 mEq/L (ref 135–145)

## 2011-08-30 LAB — SURGICAL PCR SCREEN
MRSA, PCR: NEGATIVE
Staphylococcus aureus: POSITIVE — AB

## 2011-08-31 LAB — T-HELPER CELL (CD4) - (RCID CLINIC ONLY): CD4 % Helper T Cell: 11 — ABNORMAL LOW

## 2011-09-04 ENCOUNTER — Telehealth: Payer: Self-pay | Admitting: *Deleted

## 2011-09-04 LAB — T-HELPER CELL (CD4) - (RCID CLINIC ONLY): CD4 T Cell Abs: 220 — ABNORMAL LOW

## 2011-09-04 NOTE — Telephone Encounter (Signed)
Mrs [name] called to inquire as to how we are following and treating this patient. Advised her I was not familiar with the case so I would check with the provider and his nurse. After speaking with the provider was told that he ordered the central line but because the patient is a dialysis patient she shold not have a picc and they would not place a central line. Next route was to call a home health agency to see if we had one placed if they would administer treatment and we were told no. But that the insurance company said they would not pay for the procedure nor would they pay for home health to follow her. At this point they are still trying to find out how to proceed. But they are still working on it.

## 2011-09-08 LAB — CBC
Hemoglobin: 12.3
MCHC: 33
RBC: 4.05
WBC: 7.3

## 2011-09-08 LAB — COMPREHENSIVE METABOLIC PANEL
ALT: 49 — ABNORMAL HIGH
AST: 64 — ABNORMAL HIGH
Alkaline Phosphatase: 99
CO2: 29
Chloride: 101
GFR calc Af Amer: 8 — ABNORMAL LOW
GFR calc non Af Amer: 7 — ABNORMAL LOW
Glucose, Bld: 93
Potassium: 5
Sodium: 139
Total Bilirubin: 0.5

## 2011-09-11 LAB — CBC
HCT: 29.4 — ABNORMAL LOW
HCT: 30 — ABNORMAL LOW
Hemoglobin: 9.4 — ABNORMAL LOW
Hemoglobin: 9.9 — ABNORMAL LOW
MCHC: 33
MCHC: 33
MCV: 87.1
MCV: 89
Platelets: 127 — ABNORMAL LOW
Platelets: 137 — ABNORMAL LOW
Platelets: 149 — ABNORMAL LOW
RDW: 15.3
RDW: 15.8 — ABNORMAL HIGH
RDW: 17.9 — ABNORMAL HIGH

## 2011-09-11 LAB — RENAL FUNCTION PANEL
Albumin: 2.3 — ABNORMAL LOW
BUN: 43 — ABNORMAL HIGH
CO2: 25
Calcium: 8.5
Calcium: 8.5
Creatinine, Ser: 7.89 — ABNORMAL HIGH
Creatinine, Ser: 8.18 — ABNORMAL HIGH
GFR calc Af Amer: 6 — ABNORMAL LOW
GFR calc non Af Amer: 5 — ABNORMAL LOW
Glucose, Bld: 189 — ABNORMAL HIGH
Phosphorus: 1.8 — ABNORMAL LOW
Phosphorus: 5.3 — ABNORMAL HIGH

## 2011-09-11 LAB — BASIC METABOLIC PANEL
BUN: 14
CO2: 28
Calcium: 8.5
Creatinine, Ser: 5.03 — ABNORMAL HIGH
Glucose, Bld: 100 — ABNORMAL HIGH
Sodium: 139

## 2011-09-12 LAB — DIFFERENTIAL
Basophils Absolute: 0.1
Basophils Relative: 1
Eosinophils Absolute: 0.4
Neutrophils Relative %: 69

## 2011-09-12 LAB — RENAL FUNCTION PANEL
Albumin: 2.3 — ABNORMAL LOW
Albumin: 2.4 — ABNORMAL LOW
BUN: 12
BUN: 17
BUN: 45 — ABNORMAL HIGH
BUN: 72 — ABNORMAL HIGH
BUN: 80 — ABNORMAL HIGH
CO2: 15 — ABNORMAL LOW
CO2: 22
CO2: 25
CO2: 28
Calcium: 7.8 — ABNORMAL LOW
Calcium: 8.3 — ABNORMAL LOW
Chloride: 105
Chloride: 106
Chloride: 115 — ABNORMAL HIGH
Creatinine, Ser: 10.06 — ABNORMAL HIGH
Creatinine, Ser: 10.55 — ABNORMAL HIGH
Creatinine, Ser: 4.93 — ABNORMAL HIGH
Creatinine, Ser: 5.14 — ABNORMAL HIGH
Creatinine, Ser: 7.11 — ABNORMAL HIGH
Creatinine, Ser: 7.42 — ABNORMAL HIGH
GFR calc Af Amer: 11 — ABNORMAL LOW
GFR calc Af Amer: 7 — ABNORMAL LOW
GFR calc non Af Amer: 4 — ABNORMAL LOW
GFR calc non Af Amer: 6 — ABNORMAL LOW
GFR calc non Af Amer: 6 — ABNORMAL LOW
Glucose, Bld: 106 — ABNORMAL HIGH
Glucose, Bld: 132 — ABNORMAL HIGH
Glucose, Bld: 74
Phosphorus: 3.5
Phosphorus: 3.6
Phosphorus: 8.6 — ABNORMAL HIGH
Potassium: 3.9
Sodium: 133 — ABNORMAL LOW

## 2011-09-12 LAB — IRON AND TIBC
Iron: 27 — ABNORMAL LOW
Iron: 75
Saturation Ratios: 36
TIBC: 206 — ABNORMAL LOW
UIBC: 131

## 2011-09-12 LAB — URINE MICROSCOPIC-ADD ON

## 2011-09-12 LAB — LIPID PANEL
HDL: 44
Triglycerides: 109
VLDL: 22

## 2011-09-12 LAB — I-STAT 8, (EC8 V) (CONVERTED LAB)
Acid-base deficit: 10 — ABNORMAL HIGH
BUN: 73 — ABNORMAL HIGH
Bicarbonate: 17.5 — ABNORMAL LOW
Chloride: 112
Glucose, Bld: 79
HCT: 29 — ABNORMAL LOW
Hemoglobin: 9.9 — ABNORMAL LOW
Operator id: 151321
Potassium: 4.3
Sodium: 145
TCO2: 19
pCO2, Ven: 42.8 — ABNORMAL LOW
pH, Ven: 7.22 — ABNORMAL LOW

## 2011-09-12 LAB — URINALYSIS, ROUTINE W REFLEX MICROSCOPIC
Bilirubin Urine: NEGATIVE
Glucose, UA: 100 — AB
Ketones, ur: NEGATIVE
Leukocytes, UA: NEGATIVE
Nitrite: NEGATIVE
Protein, ur: >300 — AB
Specific Gravity, Urine: 1.025
Urobilinogen, UA: 0.2
pH: 6

## 2011-09-12 LAB — TSH
TSH: 1.469
TSH: 1.779
TSH: 2.674

## 2011-09-12 LAB — HIV 1/2 CONFIRMATION
HIV-1 antibody: POSITIVE
HIV-2 Ab: UNDETERMINED

## 2011-09-12 LAB — FERRITIN: Ferritin: 94 (ref 10–291)

## 2011-09-12 LAB — CK TOTAL AND CKMB (NOT AT ARMC)
CK, MB: 7.5 — ABNORMAL HIGH
Relative Index: 2.5
Total CK: 299 — ABNORMAL HIGH

## 2011-09-12 LAB — TROPONIN I
Troponin I: 0.15 — ABNORMAL HIGH
Troponin I: 0.17 — ABNORMAL HIGH

## 2011-09-12 LAB — CBC
HCT: 20.4 — ABNORMAL LOW
HCT: 24.9 — ABNORMAL LOW
HCT: 31.9 — ABNORMAL LOW
HCT: 33.4 — ABNORMAL LOW
HCT: 33.6 — ABNORMAL LOW
Hemoglobin: 10.5 — ABNORMAL LOW
Hemoglobin: 11 — ABNORMAL LOW
Hemoglobin: 11 — ABNORMAL LOW
Hemoglobin: 6.8 — CL
Hemoglobin: 8 — ABNORMAL LOW
Hemoglobin: 8.4 — ABNORMAL LOW
MCHC: 32.4
MCHC: 32.4
MCHC: 32.9
MCHC: 32.9
MCHC: 33.1
MCHC: 33.5
MCHC: 33.7
MCV: 82.3
MCV: 84.2
MCV: 84.3
MCV: 85.7
MCV: 86.1
MCV: 86.8
MCV: 86.8
Platelets: 125 — ABNORMAL LOW
Platelets: 147 — ABNORMAL LOW
Platelets: 167
Platelets: 202
Platelets: 286
RBC: 2.49 — ABNORMAL LOW
RBC: 3.02 — ABNORMAL LOW
RBC: 3.11 — ABNORMAL LOW
RBC: 3.32 — ABNORMAL LOW
RBC: 3.9
RBC: 3.91
RDW: 14.5
RDW: 14.6
RDW: 14.6
RDW: 14.6
RDW: 15
WBC: 8
WBC: 8.6

## 2011-09-12 LAB — HEMOGLOBIN A1C
Hgb A1c MFr Bld: 4.8
Hgb A1c MFr Bld: 5
Mean Plasma Glucose: 101
Mean Plasma Glucose: 94

## 2011-09-12 LAB — CROSSMATCH

## 2011-09-12 LAB — CARDIAC PANEL(CRET KIN+CKTOT+MB+TROPI)
CK, MB: 7.6 — ABNORMAL HIGH
Relative Index: 2.7 — ABNORMAL HIGH
Relative Index: 3.2 — ABNORMAL HIGH
Total CK: 240 — ABNORMAL HIGH
Troponin I: 0.09 — ABNORMAL HIGH

## 2011-09-12 LAB — BASIC METABOLIC PANEL
CO2: 16 — ABNORMAL LOW
CO2: 16 — ABNORMAL LOW
Calcium: 6.8 — ABNORMAL LOW
Chloride: 109
Creatinine, Ser: 10.46 — ABNORMAL HIGH
Creatinine, Ser: 10.62 — ABNORMAL HIGH
GFR calc Af Amer: 5 — ABNORMAL LOW
Glucose, Bld: 75

## 2011-09-12 LAB — POCT CARDIAC MARKERS
CKMB, poc: 10.7
Myoglobin, poc: >500
Operator id: 294341
Troponin i, poc: <0.05

## 2011-09-12 LAB — T-HELPER CELLS (CD4) COUNT (NOT AT ARMC)
CD4 % Helper T Cell: 9 — ABNORMAL LOW
CD4 T Cell Abs: 180 — ABNORMAL LOW

## 2011-09-12 LAB — HIV ANTIBODY (ROUTINE TESTING W REFLEX): HIV: REACTIVE — AB

## 2011-09-12 LAB — B-NATRIURETIC PEPTIDE (CONVERTED LAB): Pro B Natriuretic peptide (BNP): >3200 — ABNORMAL HIGH

## 2011-09-12 LAB — RETICULOCYTES
RBC.: 3.17 — ABNORMAL LOW
Retic Count, Absolute: 47.6
Retic Ct Pct: 1.5

## 2011-09-12 LAB — POCT I-STAT CREATININE
Creatinine, Ser: 10 — ABNORMAL HIGH
Operator id: 151321

## 2011-09-12 LAB — FOLATE: Folate: 13.2

## 2011-09-12 LAB — PTH, INTACT AND CALCIUM
Calcium, Total (PTH): 7.7 — ABNORMAL LOW
PTH: 1088.2 — ABNORMAL HIGH

## 2011-09-12 LAB — VITAMIN B12: Vitamin B-12: 749 (ref 211–911)

## 2011-09-12 LAB — ABO/RH: ABO/RH(D): O POS

## 2011-09-25 NOTE — Op Note (Signed)
  NAMESHIZUKA, [name]               ACCOUNT NO.:  [id number]  MEDICAL RECORD NO.:  [id number]  LOCATION:  [facility]                         FACILITY:  [facility]  PHYSICIAN:  Nelda Severe. Kellie Simmering, M.D.  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  OPERATION: 1. Insertion of a right upper arm AV Gore-Tex graft brachial artery to     axillary vein with 6 mm Gore-Tex. 2. Bilateral ultrasound localization of internal jugular veins. 3. Insertion Diatek catheter via right internal jugular vein (19 cm).  SURGEON:  Nelda Severe. Kellie Simmering, MD  FIRST ASSISTANT:  Evorn Gong, PA.  ANESTHESIA:  LMA.  PROCEDURE:  The patient was taken to the operating room and placed in supine position.  At which time, the right upper extremity was prepped with Betadine scrub and solution, draped in a routine sterile manner after induction of satisfactory general anesthesia.  Short longitudinal incision was made in the distal upper arm.  Brachial artery exposed, encircled with vessel loops and had an excellent pulse.  Second incision was made just proximal the axilla.  Axillary vein was dissected free at its junction with the brachial vein.  It was a nice calibered vein at least 5 mm in size, widely patent.  Curvilinear tunnel was then created after infiltration of 1% Xylocaine and a 6-mm Gore-Tex graft was delivered through the tunnel.  No heparin was given.  Artery was then occluded proximally and distally with vessel loops, opened with 15 blade, extended with Potts scissors.  Gore-Tex was spatulated and anastomosed end-to-side with 6-0 Prolene.  Attention turned to the axilla where the vein was ligated distally and transected, it was 5  mm in size.  Graft transected and end-to-end anastomosis was done with 6-0 Prolene.  Clamps then released, notes a good pulse and thrill in the graft and palpable radial pulse at the wrist of the graft open. Adequate hemostasis was achieved.  The wounds both were closed in layers with Vicryl in a subcuticular fashion with Dermabond.  Attention turned to the upper chest, which was exposed both internal jugular veins were imaged using B-mode ultrasound.  Both noted to be patent.  After prepping and draping in a routine sterile manner, right internal jugular vein was entered easily, using a supraclavicular approach guidewire was passed into the inferior vena cava under fluoroscopic guidance.  After dilating the tract appropriately, a 19-cm Diatek catheter was passed through peel-away sheath, positioned in the right atrium, tunneled peripherally and secured with nylon sutures.  Wound closed with Vicryl in a subcuticular fashion.  Sterile dressing applied. The patient taken to recovery in satisfactory condition.     Nelda Severe Kellie Simmering, M.D.  JDL/MEDQ  D:  08/30/2011  T:  08/30/2011  Job:  [id number]  Electronically Signed by Tinnie Gens M.D. on 09/25/2011 09:57:37 AM

## 2011-10-12 ENCOUNTER — Telehealth: Payer: Self-pay | Admitting: *Deleted

## 2011-10-12 DIAGNOSIS — R110 Nausea: Secondary | ICD-10-CM

## 2011-10-12 MED ORDER — ONDANSETRON HCL 4 MG PO TABS: 4.0000 mg | ORAL_TABLET | Freq: Two times a day (BID) | ORAL | Status: AC | PRN

## 2011-10-12 NOTE — Telephone Encounter (Signed)
I spoke with a representative [name]) from her insurance company [phone]. I answered her questions & she stated she has approved it.

## 2011-10-18 ENCOUNTER — Other Ambulatory Visit: Payer: Self-pay | Admitting: *Deleted

## 2011-10-23 ENCOUNTER — Ambulatory Visit (HOSPITAL_COMMUNITY)
Admission: RE | Admit: 2011-10-23 | Discharge: 2011-10-23 | Disposition: A | Discharge status: Home / Self Care | Payer: Medicare Other | Source: Ambulatory Visit | Attending: Vascular Surgery | Admitting: Vascular Surgery

## 2011-10-23 DIAGNOSIS — N186 End stage renal disease: Secondary | ICD-10-CM | POA: Insufficient documentation

## 2011-10-23 DIAGNOSIS — Z452 Encounter for adjustment and management of vascular access device: Secondary | ICD-10-CM | POA: Insufficient documentation

## 2011-10-23 NOTE — Progress Notes (Signed)
[facility] Catheter Removal Procedure Note  Diagnosis: ESRD  Plan:  Remove right diatek catheter  Consent signed:  yes Time out completed:  yes Coumadin:  no PT/INR (if applicable):  N/A Other labs:  Procedure: 1.  Sterile prepping and draping over catheter area 2. 6 ml 2% lidocaine plain instilled at removal site. 3.  right catheter removed in its entirety with cuff in tact. 4.  Complications:  none 5. Tip of catheter sent for culture:  no   Patient tolerated procedure well:  yes Pressure held, no bleeding noted, dressing applied Instructions given to the pt regarding wound care and bleeding.  OtherEvorn Gong 10/23/2011 10:58 AM

## 2011-10-23 NOTE — Progress Notes (Signed)
Right subclavian dressing CDI.  Pt d/c home

## 2011-10-23 NOTE — H&P (Signed)
[facility] SHORT STAY H&P  HPI:  This is a 65 y/o female here to have her right IJ diatek catheter removed.   She has a right arm AVG that they are using for dialysis without difficulty.  She has not had any bleeding problems and is not on coumadin.  Otherwise, ROS is negative.   Past Medical History  Diagnosis Date  . HIV infection     Dx in 1998 in [place], she presented with PCP pneumonia at that time. Has been tried on multiple regimens by her PCP in [place] before./ She has been on current ART for years now. Moved to [facility] in 2008 and is following with Dr. Tommy Medal since then.   . Hepatitis C     untreated. VL 3700000 in 2008  . Diabetes mellitus     Las HbA1C   . Hypertension   . Chronic kidney disease     ESRD secondary to DM, started HD in 2008, HD TTS, Dr. Jimmy Footman is her nephrologist, on transplant list at [facility].    FH:  Non-Contributory  History   Social History  . Marital Status: Single    Spouse Name: N/A    Number of Children: N/A  . Years of Education: N/A   Occupational History  . Not on file.   Social History Main Topics  . Smoking status: Never Smoker   . Smokeless tobacco: Not on file  . Alcohol Use: No  . Drug Use: No  . Sexually Active: Not on file   Other Topics Concern  . Not on file   Social History Narrative  . No narrative on file    Allergies  Allergen Reactions  . Influenza Virus Vaccine     Alleged severe illness in [place]. Do not have documentation from [place] but pt always refuses vaccine on these grounds    Current Outpatient Prescriptions  Medication Sig Dispense Refill  . abacavir (ZIAGEN) 300 MG tablet Take 1 tablet (300 mg total) by mouth 2 (two) times daily.  60 tablet  6  . amLODipine (NORVASC) 5 MG tablet Take 5 mg by mouth daily.        Marland Kitchen aspirin 81 MG EC tablet Take 81 mg by mouth daily.        . calcium acetate (PHOSLO) 667 MG capsule Take 667 mg by mouth. Take 4 caps with meals & 2 caps with snacks       .  efavirenz (SUSTIVA) 600 MG tablet Take 1 tablet (600 mg total) by mouth daily.  90 tablet  1  . lamiVUDine (EPIVIR) 10 MG/ML solution Take 2.5 mLs (25 mg total) by mouth daily.  225 mL  1  . lisinopril (PRINIVIL,ZESTRIL) 40 MG tablet Take 40 mg by mouth. Take one every day that you do NOT go to dialysis       . omeprazole (PRILOSEC) 20 MG capsule Take 20 mg by mouth at bedtime.        . ondansetron (ZOFRAN) 4 MG tablet Take 1 tablet (4 mg total) by mouth every 12 (twelve) hours as needed for nausea.  60 tablet  1  . rosuvastatin (CRESTOR) 20 MG tablet Take 1 tablet (20 mg total) by mouth daily.  90 tablet  1    ROS:  See HPI  PHYSICAL EXAM  Filed Vitals:   10/23/11 1008  BP: 162/79  Pulse: 66  Temp: 96.8 F (36 C)  Resp: 18    Gen: NAD HEENT: normocephalic  Neck:  Right IJ catheter in place Heart: RRR Lungs: non labored Extremities: +AVG right arm +thrill Skin: no obvious rashes Neuro: - deficits   Lab/X-ray: n/a  Impression: This is a 65 y.o. female here for diatek catheter removal Pt is not on coumadin. Plan:  Removal of right IJ diatek catheter  Evorn Gong, PA-C [facility] [phone] 10/23/2011 10:54 AM  10:50 AM

## 2011-10-23 NOTE — H&P (Signed)
Addendum  I have independently interviewed and examined the patient, and I agree with the physician assistant's H&P.  Adele Barthel, MD [facility] [place] Office: [phone] Pager: [phone]  10/23/2011, 11:47 AM

## 2011-12-05 DIAGNOSIS — Z9289 Personal history of other medical treatment: Secondary | ICD-10-CM

## 2011-12-05 HISTORY — DX: Personal history of other medical treatment: Z92.89

## 2011-12-05 HISTORY — PX: KIDNEY TRANSPLANT: SHX239

## 2011-12-20 ENCOUNTER — Encounter (HOSPITAL_COMMUNITY): Payer: Self-pay

## 2011-12-20 ENCOUNTER — Emergency Department (INDEPENDENT_AMBULATORY_CARE_PROVIDER_SITE_OTHER)
Admission: EM | Admit: 2011-12-20 | Discharge: 2011-12-20 | Disposition: A | Discharge status: Home / Self Care | Payer: Medicare Other | Source: Home / Self Care | Attending: Emergency Medicine | Admitting: Emergency Medicine

## 2011-12-20 DIAGNOSIS — H6690 Otitis media, unspecified, unspecified ear: Secondary | ICD-10-CM

## 2011-12-20 DIAGNOSIS — H6692 Otitis media, unspecified, left ear: Secondary | ICD-10-CM

## 2011-12-20 DIAGNOSIS — R059 Cough, unspecified: Secondary | ICD-10-CM

## 2011-12-20 DIAGNOSIS — R05 Cough: Secondary | ICD-10-CM

## 2011-12-20 HISTORY — DX: Dependence on renal dialysis: Z99.2

## 2011-12-20 MED ORDER — AMOXICILLIN 500 MG PO CAPS: 1000.0000 mg | ORAL_CAPSULE | Freq: Three times a day (TID) | ORAL | Status: AC

## 2011-12-20 MED ORDER — FLUTICASONE PROPIONATE 50 MCG/ACT NA SUSP: 2.0000 | Freq: Every day | NASAL | Status: DC

## 2011-12-20 MED ORDER — HYDROCODONE-HOMATROPINE 5-1.5 MG/5ML PO SYRP: 5.0000 mL | ORAL_SOLUTION | Freq: Four times a day (QID) | ORAL | Status: AC | PRN

## 2011-12-20 NOTE — ED Notes (Signed)
C/o 3 week duration of cough, congestion; co ear pain since Monday

## 2011-12-20 NOTE — ED Provider Notes (Signed)
History     CSN: [id number]  Arrival date & time 12/20/11  1225   First MD Initiated Contact with Patient 12/20/11 1313      Chief Complaint  Patient presents with  . Nasal Congestion    (Consider location/radiation/quality/duration/timing/severity/associated sxs/prior treatment) HPI Comments: Pt with nasal congestion, postnasal drip, ST x 3 weeks. C/o decreased hearing, ear fullness, mild ear pain x 2 days. States ST worse on L side. Also c/o nonproductive cough worse at night, states throat sore from all of the coughing. Unable to sleep at night secondary to coughing. Started taking robitussin last night w/o relief for cough. No fevers, N/V, CP, SOB, wheezing, voice changes, sensation of throat swelling shut, oral or pharyngeal exudates.no water brash, chest tightness, other reflux sx.  Last CD4 250, viral load undectable.   ROS as noted in HPI. All other ROS negative.   The history is provided by the patient.    Past Medical History  Diagnosis Date  . HIV infection     Dx in 1998 in [place], she presented with PCP pneumonia at that time. Has been tried on multiple regimens by her PCP in [place] before./ She has been on current ART for years now. Moved to [facility] in 2008 and is following with Dr. Tommy Medal since then.   . Hepatitis C     untreated. VL 3700000 in 2008  . Diabetes mellitus     Las HbA1C   . Hypertension   . Chronic kidney disease     ESRD secondary to DM, started HD in 2008, HD TTS, Dr. Jimmy Footman is her nephrologist, on transplant list at [facility].  . Dialysis patient     T Th Sat    Past Surgical History  Procedure Date  . Abdominal hysterectomy     and oopherectomy for fibroids  . Cholecystectomy     Family History  Problem Relation Age of Onset  . Diabetes Mother   . Cancer Father   . Cancer Brother     History  Substance Use Topics  . Smoking status: Never Smoker   . Smokeless tobacco: Not on file  . Alcohol Use: No    OB History    Grav Para Term Preterm  Abortions TAB SAB Ect Mult Living                  Review of Systems  Allergies  Flumist  Home Medications   Current Outpatient Rx  Name Route Sig Dispense Refill  . ABACAVIR SULFATE 300 MG PO TABS Oral Take 1 tablet (300 mg total) by mouth 2 (two) times daily. 60 tablet 6  . ASPIRIN 81 MG PO TBEC Oral Take 81 mg by mouth daily.      Marland Kitchen CALCIUM ACETATE 667 MG PO CAPS Oral Take 667 mg by mouth. Take 4 caps with meals & 2 caps with snacks     . CINACALCET HCL 30 MG PO TABS Oral Take 30 mg by mouth daily.    . EFAVIRENZ 600 MG PO TABS Oral Take 1 tablet (600 mg total) by mouth daily. 90 tablet 1  . GUAIFENESIN 100 MG/5ML PO SYRP Oral Take 200 mg by mouth 3 (three) times daily as needed.    Marland Kitchen LAMIVUDINE 10 MG/ML PO SOLN Oral Take 2.5 mLs (25 mg total) by mouth daily. 225 mL 1  . LANTHANUM CARBONATE 1000 MG PO CHEW Oral Chew 1,000 mg by mouth 2 (two) times daily with a meal.    .  LISINOPRIL 40 MG PO TABS Oral Take 40 mg by mouth. Take one every day that you do NOT go to dialysis     . OMEPRAZOLE 20 MG PO CPDR Oral Take 20 mg by mouth at bedtime.      Marland Kitchen ONDANSETRON HCL 4 MG PO TABS Oral Take 1 tablet (4 mg total) by mouth every 12 (twelve) hours as needed for nausea. 60 tablet 1  . ROSUVASTATIN CALCIUM 20 MG PO TABS Oral Take 1 tablet (20 mg total) by mouth daily. 90 tablet 1  . AMOXICILLIN 500 MG PO CAPS Oral Take 2 capsules (1,000 mg total) by mouth 3 (three) times daily. X 10 days 30 capsule 0  . FLUTICASONE PROPIONATE 50 MCG/ACT NA SUSP Nasal Place 2 sprays into the nose daily. 16 g 0  . HYDROCODONE-HOMATROPINE 5-1.5 MG/5ML PO SYRP Oral Take 5 mLs by mouth every 6 (six) hours as needed for cough or pain. 120 mL 0    BP 173/66  Pulse 90  Temp(Src) 98.2 F (36.8 C) (Oral)  Resp 20  SpO2 100%  Physical Exam  Nursing note and vitals reviewed. Constitutional: She is oriented to person, place, and time. She appears well-developed and well-nourished.  HENT:  Head: Normocephalic and  atraumatic.  Right Ear: Tympanic membrane and ear canal normal.  Left Ear: Ear canal normal. No drainage. Tympanic membrane is erythematous. Tympanic membrane is not bulging. A middle ear effusion is present. Decreased hearing is noted.  Nose: Mucosal edema and rhinorrhea present. No epistaxis.  Mouth/Throat: Uvula is midline and mucous membranes are normal. Posterior oropharyngeal erythema present. No oropharyngeal exudate.       (-) frontal, maxillary sinus tenderness  Eyes: Conjunctivae and EOM are normal. Pupils are equal, round, and reactive to light.  Neck: Normal range of motion. Neck supple.  Cardiovascular: Normal rate, regular rhythm and normal heart sounds.   Pulmonary/Chest: Effort normal and breath sounds normal. No respiratory distress. She has no wheezes. She has no rales.  Abdominal: Soft. Bowel sounds are normal.  Musculoskeletal: Normal range of motion.  Lymphadenopathy:    She has no cervical adenopathy.  Neurological: She is alert and oriented to person, place, and time.  Skin: Skin is warm and dry. No rash noted.  Psychiatric: She has a normal mood and affect. Her behavior is normal. Judgment and thought content normal.    ED Course  Procedures (including critical care time)  Labs Reviewed - No data to display No results found.   1. Otitis media of left ear   2. Cough     MDM  Think pt with OM secondary to viral syndrome. Has had sx for 3 weeks. Will tx ear with amoxicillin. ST most likely from postnasal drip and poss ear infection. flonase for posatnasal drip. Hycodan for cough. Will have her continue guafinesin. No evidence of PNA at this time. Lungs clear, satting well.   Cherly Beach, MD 12/21/11 [phone]

## 2012-01-08 ENCOUNTER — Encounter: Payer: Self-pay | Admitting: Infectious Disease

## 2012-01-08 ENCOUNTER — Ambulatory Visit (INDEPENDENT_AMBULATORY_CARE_PROVIDER_SITE_OTHER): Payer: Medicare Other | Admitting: Infectious Disease

## 2012-01-08 VITALS — BP 205/69 | HR 74 | Temp 97.4°F | Ht 63.0 in | Wt 137.0 lb

## 2012-01-08 DIAGNOSIS — B1710 Acute hepatitis C without hepatic coma: Secondary | ICD-10-CM

## 2012-01-08 DIAGNOSIS — H6690 Otitis media, unspecified, unspecified ear: Secondary | ICD-10-CM

## 2012-01-08 DIAGNOSIS — A539 Syphilis, unspecified: Secondary | ICD-10-CM

## 2012-01-08 DIAGNOSIS — F0390 Unspecified dementia without behavioral disturbance: Secondary | ICD-10-CM

## 2012-01-08 DIAGNOSIS — E119 Type 2 diabetes mellitus without complications: Secondary | ICD-10-CM

## 2012-01-08 DIAGNOSIS — Z113 Encounter for screening for infections with a predominantly sexual mode of transmission: Secondary | ICD-10-CM

## 2012-01-08 DIAGNOSIS — I10 Essential (primary) hypertension: Secondary | ICD-10-CM

## 2012-01-08 DIAGNOSIS — L0291 Cutaneous abscess, unspecified: Secondary | ICD-10-CM

## 2012-01-08 DIAGNOSIS — B20 Human immunodeficiency virus [HIV] disease: Secondary | ICD-10-CM

## 2012-01-08 LAB — CBC WITH DIFFERENTIAL/PLATELET
Basophils Absolute: 0 10*3/uL (ref 0.0–0.1)
Basophils Relative: 1 % (ref 0–1)
Eosinophils Absolute: 0.3 10*3/uL (ref 0.0–0.7)
Eosinophils Relative: 6 % — ABNORMAL HIGH (ref 0–5)
HCT: 32.1 % — ABNORMAL LOW (ref 36.0–46.0)
Hemoglobin: 10.4 g/dL — ABNORMAL LOW (ref 12.0–15.0)
Neutro Abs: 3.4 10*3/uL (ref 1.7–7.7)
Neutrophils Relative %: 60 % (ref 43–77)
RBC: 3.36 MIL/uL — ABNORMAL LOW (ref 3.87–5.11)

## 2012-01-08 LAB — COMPLETE METABOLIC PANEL WITH GFR
ALT: 46 U/L — ABNORMAL HIGH (ref 0–35)
AST: 62 U/L — ABNORMAL HIGH (ref 0–37)
BUN: 82 mg/dL — ABNORMAL HIGH (ref 6–23)
Creat: 11.41 mg/dL (ref 0.50–1.10)
Total Bilirubin: 0.6 mg/dL (ref 0.3–1.2)

## 2012-01-08 MED ORDER — LEVOFLOXACIN 750 MG PO TABS: 750.0000 mg | ORAL_TABLET | Freq: Every day | ORAL | Status: AC

## 2012-01-08 MED ORDER — ATENOLOL 50 MG PO TABS: 50.0000 mg | ORAL_TABLET | Freq: Every day | ORAL | Status: DC

## 2012-01-08 MED ORDER — LEVOFLOXACIN 500 MG PO TABS: 500.0000 mg | ORAL_TABLET | ORAL | Status: AC

## 2012-01-08 NOTE — Assessment & Plan Note (Signed)
Lipids at goal. Need check a1c

## 2012-01-08 NOTE — Assessment & Plan Note (Signed)
Recheck labs, continue ARV

## 2012-01-08 NOTE — Assessment & Plan Note (Signed)
Will give her trial of levaquin with decongestants. If still symptomatic refer to ENT

## 2012-01-08 NOTE — Progress Notes (Signed)
Subjective:    Patient ID: [name], female    DOB: 12-22-1945, 66 y.o.   MRN: [id number]  HPI  [name] is a 66 y.o. female who is doing superbly well on their her antiviral regimen, with undetectable viral load and health cd4 count. She has ESRD and is on HD. She had HD IJ catheter removed this fall but still had stitch in place. In the last 24 hours she has had painful swollen tissue at this site concerning for small abscess.   Second issue is that she has had trouble hearing out of left ear and feels fullness in ears. She has been rx with amoxicillin and inhaled steroids for [facility] without relief. She has no fevers or chills or malaise.  Thirdly, her elevated RPR has still not been evluated or treated. Apparently medicare would not pay for home iV pcn which she had wished to do rather than LP. Now she wishes to go ahead and get LP to clarify matters.   She has been taking all of her ARV faithfully. I spent greater than 45 minutes with the patient including greater than 50% of time in face to face counsel of the patient and in coordination of their care.    Review of Systems  Constitutional: Negative for fever, chills, diaphoresis, activity change, appetite change, fatigue and unexpected weight change.  HENT: Positive for hearing loss. Negative for congestion, sore throat, rhinorrhea, sneezing, trouble swallowing and sinus pressure.   Eyes: Negative for photophobia and visual disturbance.  Respiratory: Negative for cough, chest tightness, shortness of breath, wheezing and stridor.   Cardiovascular: Negative for chest pain, palpitations and leg swelling.  Gastrointestinal: Negative for nausea, vomiting, abdominal pain, diarrhea, constipation, blood in stool, abdominal distention and anal bleeding.  Genitourinary: Negative for dysuria, hematuria, flank pain and difficulty urinating.  Musculoskeletal: Negative for myalgias, back pain, joint swelling, arthralgias and gait  problem.  Skin: Positive for wound. Negative for color change, pallor and rash.  Neurological: Negative for dizziness, tremors, weakness and light-headedness.  Hematological: Negative for adenopathy. Does not bruise/bleed easily.  Psychiatric/Behavioral: Negative for behavioral problems, confusion, sleep disturbance, dysphoric mood, decreased concentration and agitation.       Objective:   Physical Exam  Constitutional: She is oriented to person, place, and time. She appears well-developed and well-nourished. No distress.  HENT:  Head: Normocephalic and atraumatic.  Right Ear: No drainage or tenderness. A middle ear effusion is present.  Left Ear: No drainage or tenderness. A middle ear effusion is present. Decreased hearing is noted.  Mouth/Throat: Oropharynx is clear and moist. No oropharyngeal exudate.  Eyes: Conjunctivae and EOM are normal. Pupils are equal, round, and reactive to light. No scleral icterus.  Neck: Normal range of motion. Neck supple. No JVD present.  Cardiovascular: Normal rate, regular rhythm and normal heart sounds.  Exam reveals no gallop and no friction rub.   No murmur heard. Pulmonary/Chest: Effort normal and breath sounds normal. No respiratory distress. She has no wheezes. She has no rales. She exhibits no tenderness.  Abdominal: She exhibits no distension and no mass. There is no tenderness. There is no rebound and no guarding.  Musculoskeletal: She exhibits no edema and no tenderness.  Lymphadenopathy:    She has no cervical adenopathy.  Neurological: She is alert and oriented to person, place, and time. She has normal reflexes. She exhibits normal muscle tone. Coordination normal.  Skin: Skin is warm and dry. She is not diaphoretic. No erythema. No pallor.  Psychiatric: She has a normal mood and affect. Her behavior is normal. Judgment and thought content normal.   PROCEDURE: INFORMED CONSENT OBTAINED RIGHT CHEST PREPPED AND DRAPED IN USUAL STERILE  MANNER SKIN INFILTRATED WITH 1% LICODAINE SUTURE REMOVED WITHOTU DIFFUCULTY. INCISION MADE WITH EXPRESSION OF INITIALLY PURULENT THEN BLOODY MATERIAL THAT WAS SENT FOR CULTURE  COMPLICATIONS: NONE EBL: MINIMAL      Assessment & Plan:  HIV INFECTION Recheck labs, continue ARV  Abscess I performed I and D of this area and sent for culture. WIll add in anti-MRSA drug if we recover it such as doxy. I am givin gher levaquin for her ears  Otitis media Will give her trial of levaquin with decongestants. If still symptomatic refer to ENT  UNSPECIFIED SYPHILIS Check lp for vdrl csf, cell count and diff, protein and glucose, culture and FTA-Abs  HEPATITIS C Untreated. Consider referral to trial at [facility]  DM Lipids at goal. Need check a1c

## 2012-01-08 NOTE — Patient Instructions (Addendum)
Please purchase over the counter decongestant such as phenyleprine and take this for the next week to improve the congestion in your ears  Also please resume your intranasal steroid  If your hearing does not improver further we will refer you to ENT  WE still need to arrange spinal tap as well

## 2012-01-08 NOTE — Assessment & Plan Note (Signed)
I performed I and D of this area and sent for culture. WIll add in anti-MRSA drug if we recover it such as doxy. I am givin gher levaquin for her ears

## 2012-01-08 NOTE — Assessment & Plan Note (Signed)
Untreated. Consider referral to trial at [facility]

## 2012-01-08 NOTE — Assessment & Plan Note (Signed)
Check lp for vdrl csf, cell count and diff, protein and glucose, culture and FTA-Abs

## 2012-01-09 LAB — T-HELPER CELL (CD4) - (RCID CLINIC ONLY): CD4 T Cell Abs: 270 uL — ABNORMAL LOW (ref 400–2700)

## 2012-01-10 LAB — HIV-1 RNA QUANT-NO REFLEX-BLD
HIV 1 RNA Quant: <20 copies/mL (ref <20)
HIV-1 RNA Quant, Log: <1.3 {Log} (ref <1.30)

## 2012-01-11 LAB — WOUND CULTURE
Gram Stain: NONE SEEN
Gram Stain: NONE SEEN

## 2012-02-20 ENCOUNTER — Other Ambulatory Visit: Payer: Self-pay | Admitting: Licensed Clinical Social Worker

## 2012-02-20 DIAGNOSIS — B20 Human immunodeficiency virus [HIV] disease: Secondary | ICD-10-CM

## 2012-02-20 DIAGNOSIS — Z113 Encounter for screening for infections with a predominantly sexual mode of transmission: Secondary | ICD-10-CM

## 2012-03-27 ENCOUNTER — Other Ambulatory Visit: Payer: Self-pay | Admitting: *Deleted

## 2012-03-27 ENCOUNTER — Other Ambulatory Visit: Payer: Medicare Other

## 2012-03-27 ENCOUNTER — Telehealth: Payer: Self-pay | Admitting: Infectious Disease

## 2012-03-27 DIAGNOSIS — B20 Human immunodeficiency virus [HIV] disease: Secondary | ICD-10-CM

## 2012-03-27 DIAGNOSIS — Z113 Encounter for screening for infections with a predominantly sexual mode of transmission: Secondary | ICD-10-CM

## 2012-03-27 LAB — COMPLETE METABOLIC PANEL WITH GFR
Alkaline Phosphatase: 129 U/L — ABNORMAL HIGH (ref 39–117)
BUN: 49 mg/dL — ABNORMAL HIGH (ref 6–23)
CO2: 31 mEq/L (ref 19–32)
Creat: 7.89 mg/dL — ABNORMAL HIGH (ref 0.50–1.10)
GFR, Est African American: 6 mL/min — ABNORMAL LOW
GFR, Est Non African American: 5 mL/min — ABNORMAL LOW
Glucose, Bld: 130 mg/dL — ABNORMAL HIGH (ref 70–99)
Sodium: 140 mEq/L (ref 135–145)
Total Bilirubin: 0.6 mg/dL (ref 0.3–1.2)

## 2012-03-27 LAB — CBC WITH DIFFERENTIAL/PLATELET
Basophils Relative: 1 % (ref 0–1)
Eosinophils Absolute: 0.4 10*3/uL (ref 0.0–0.7)
Eosinophils Relative: 6 % — ABNORMAL HIGH (ref 0–5)
Hemoglobin: 11.7 g/dL — ABNORMAL LOW (ref 12.0–15.0)
Lymphs Abs: 1.4 10*3/uL (ref 0.7–4.0)
MCH: 29.7 pg (ref 26.0–34.0)
MCHC: 31.7 g/dL (ref 30.0–36.0)
MCV: 93.7 fL (ref 78.0–100.0)
Monocytes Relative: 8 % (ref 3–12)
Neutrophils Relative %: 67 % (ref 43–77)

## 2012-03-27 LAB — RPR: RPR Ser Ql: REACTIVE — AB

## 2012-03-27 MED ORDER — EFAVIRENZ 600 MG PO TABS: 600.0000 mg | ORAL_TABLET | Freq: Every day | ORAL | Status: DC

## 2012-03-27 MED ORDER — LAMIVUDINE 10 MG/ML PO SOLN: 25.0000 mg | Freq: Every day | ORAL | Status: DC

## 2012-03-27 NOTE — Telephone Encounter (Signed)
[name] had talked in my note about getting LP to rule out Neurosyphilis in [place] based on her persistently high RPR titer of 1:16. Is she willing to have LP to rule out Nsyphilis or does she simpy want a repeat of PCN weekly x 3 wks?

## 2012-03-27 NOTE — Telephone Encounter (Signed)
Patient is changing pharmacy back to local and not mail order. She was in clinic today to have lab work done.

## 2012-03-28 ENCOUNTER — Telehealth: Payer: Self-pay | Admitting: Licensed Clinical Social Worker

## 2012-03-28 LAB — T-HELPER CELL (CD4) - (RCID CLINIC ONLY): CD4 T Cell Abs: 250 uL — ABNORMAL LOW (ref 400–2700)

## 2012-03-28 LAB — T.PALLIDUM AB, TOTAL: T pallidum Antibodies (TP-PA): >8 S/CO — ABNORMAL HIGH (ref <0.90)

## 2012-03-28 NOTE — Telephone Encounter (Signed)
I was unable to get the patient IV treatment coordinated because her dialysis would not administer it, and her insurance would not cover it. [facility] couldn't do it because she was a dialysis patient. The pcn times 3 would be a better option for her.

## 2012-03-28 NOTE — Telephone Encounter (Signed)
[name] from [facility] called about an alert value for her creatinine at 7.89. The patient is on dialysis and runs high. She was 11.41 last month.

## 2012-03-29 LAB — HIV-1 RNA ULTRAQUANT REFLEX TO GENTYP+
HIV 1 RNA Quant: <20 copies/mL (ref <20)
HIV-1 RNA Quant, Log: <1.3 {Log} (ref <1.30)

## 2012-04-02 ENCOUNTER — Other Ambulatory Visit: Payer: Self-pay | Admitting: Licensed Clinical Social Worker

## 2012-04-02 NOTE — Telephone Encounter (Signed)
Ok I will call her and schedule 3 nurse visits

## 2012-04-02 NOTE — Telephone Encounter (Signed)
OK lets go with the 3 IM injections. fi this fails we will put central line in and give her PCN IV in the hospital or SNF or whatever medicare will cover or give her IM daily for two weeks

## 2012-04-03 NOTE — Telephone Encounter (Signed)
Thanks Tamika. 

## 2012-04-10 ENCOUNTER — Ambulatory Visit: Payer: Medicare Other | Admitting: Infectious Disease

## 2012-04-10 ENCOUNTER — Ambulatory Visit (INDEPENDENT_AMBULATORY_CARE_PROVIDER_SITE_OTHER): Payer: Medicare Other

## 2012-04-10 DIAGNOSIS — A539 Syphilis, unspecified: Secondary | ICD-10-CM

## 2012-04-10 MED ORDER — PENICILLIN G BENZATHINE 1200000 UNIT/2ML IM SUSP
1.2000 10*6.[IU] | Freq: Once | INTRAMUSCULAR | Status: AC
  Administered 2012-04-10: 1.2 10*6.[IU] via INTRAMUSCULAR

## 2012-04-15 ENCOUNTER — Encounter: Payer: Self-pay | Admitting: Infectious Disease

## 2012-04-15 ENCOUNTER — Ambulatory Visit (INDEPENDENT_AMBULATORY_CARE_PROVIDER_SITE_OTHER): Payer: Medicare Other | Admitting: Infectious Disease

## 2012-04-15 VITALS — BP 172/82 | HR 80 | Temp 97.7°F | Wt 137.0 lb

## 2012-04-15 DIAGNOSIS — A539 Syphilis, unspecified: Secondary | ICD-10-CM

## 2012-04-15 DIAGNOSIS — B20 Human immunodeficiency virus [HIV] disease: Secondary | ICD-10-CM

## 2012-04-15 DIAGNOSIS — I10 Essential (primary) hypertension: Secondary | ICD-10-CM

## 2012-04-15 NOTE — Assessment & Plan Note (Signed)
Perfect control

## 2012-04-15 NOTE — Progress Notes (Signed)
Subjective:    Patient ID: [name], female    DOB: 01-25-1946, 66 y.o.   MRN: [id number]  HPI  [name] is a 66 y.o. female who is doing superbly well on her antiviral regimen,  Of sustiva, abacavir and epivir with undetectable viral load and health cd4 count. She does have ESRD and is on HD Tu, TH, SA. She says Dr. Jimmy Footman took her off her beta blocker due to low bp with HD though her BP here is in 170 range. Her RPR is still 1:16 and she started IM PCN x 3 weeks. SHe will likely need 2 weeks of IV penicillin vs 2 weeks of IM rocephin if titers dont fall post this therapy. SHe also requests PCP MD. I spent greater than 45 minutes with the patient including greater than 50% of time in face to face counsel of the patient and in coordination of their care.    Review of Systems  Constitutional: Negative for fever, chills, diaphoresis, activity change, appetite change, fatigue and unexpected weight change.  HENT: Negative for congestion, sore throat, rhinorrhea, sneezing, trouble swallowing and sinus pressure.   Eyes: Negative for photophobia and visual disturbance.  Respiratory: Negative for cough, chest tightness, shortness of breath, wheezing and stridor.   Cardiovascular: Negative for chest pain, palpitations and leg swelling.  Gastrointestinal: Negative for nausea, vomiting, abdominal pain, diarrhea, constipation, blood in stool, abdominal distention and anal bleeding.  Genitourinary: Negative for dysuria, hematuria, flank pain and difficulty urinating.  Musculoskeletal: Negative for myalgias, back pain, joint swelling, arthralgias and gait problem.  Skin: Negative for color change, pallor, rash and wound.  Neurological: Negative for dizziness, tremors, weakness and light-headedness.  Hematological: Negative for adenopathy. Does not bruise/bleed easily.  Psychiatric/Behavioral: Negative for behavioral problems, confusion, sleep disturbance, dysphoric mood, decreased  concentration and agitation.       Objective:   Physical Exam  Constitutional: She is oriented to person, place, and time. She appears well-developed and well-nourished. No distress.  HENT:  Head: Normocephalic and atraumatic.  Mouth/Throat: Oropharynx is clear and moist. No oropharyngeal exudate.  Eyes: Conjunctivae and EOM are normal. Pupils are equal, round, and reactive to light. No scleral icterus.  Neck: Normal range of motion. Neck supple. No JVD present.  Cardiovascular: Normal rate, regular rhythm and normal heart sounds.  Exam reveals no gallop and no friction rub.   No murmur heard. Pulmonary/Chest: Effort normal and breath sounds normal. No respiratory distress. She has no wheezes. She has no rales. She exhibits no tenderness.  Abdominal: She exhibits no distension and no mass. There is no tenderness. There is no rebound and no guarding.  Musculoskeletal: She exhibits no edema and no tenderness.  Lymphadenopathy:    She has no cervical adenopathy.  Neurological: She is alert and oriented to person, place, and time. She has normal reflexes. She exhibits normal muscle tone. Coordination normal.  Skin: Skin is warm and dry. She is not diaphoretic. No erythema. No pallor.  Psychiatric: She has a normal mood and affect. Her behavior is normal. Judgment and thought content normal.          Assessment & Plan:  HIV INFECTION Perfect control  UNSPECIFIED SYPHILIS Retreat with IM PCN weekly x 3 weeks. If titers dont fall will rx with 2 weeks IM rocephin since medicare wont cover IV PCN at home and pt refuses LP to rule out NS  ESSENTIAL HYPERTENSION, BENIGN Has had very high BP in clinic. Will check with Dr. Jimmy Footman  to see if pt truly was told to stop her beta blocker. Sh is on 40 of lisinopril at present

## 2012-04-15 NOTE — Patient Instructions (Signed)
You will need another shot of PCN on Wednesday and then next week We will refer you to PCP

## 2012-04-15 NOTE — Assessment & Plan Note (Signed)
Retreat with IM PCN weekly x 3 weeks. If titers dont fall will rx with 2 weeks IM rocephin since medicare wont cover IV PCN at home and pt refuses LP to rule out NS

## 2012-04-15 NOTE — Assessment & Plan Note (Signed)
Has had very high BP in clinic. Will check with Dr. Jimmy Footman to see if pt truly was told to stop her beta blocker. Sh is on 40 of lisinopril at present

## 2012-04-17 ENCOUNTER — Ambulatory Visit (INDEPENDENT_AMBULATORY_CARE_PROVIDER_SITE_OTHER): Payer: Medicare Other | Admitting: *Deleted

## 2012-04-17 DIAGNOSIS — A539 Syphilis, unspecified: Secondary | ICD-10-CM

## 2012-04-17 MED ORDER — PENICILLIN G BENZATHINE 1200000 UNIT/2ML IM SUSP
1.2000 10*6.[IU] | Freq: Once | INTRAMUSCULAR | Status: AC
  Administered 2012-04-17: 1.2 10*6.[IU] via INTRAMUSCULAR

## 2012-04-24 ENCOUNTER — Encounter (INDEPENDENT_AMBULATORY_CARE_PROVIDER_SITE_OTHER): Payer: Medicaid Other | Admitting: *Deleted

## 2012-04-24 DIAGNOSIS — A539 Syphilis, unspecified: Secondary | ICD-10-CM

## 2012-04-24 MED ORDER — PENICILLIN G BENZATHINE 1200000 UNIT/2ML IM SUSP
1.2000 10*6.[IU] | Freq: Once | INTRAMUSCULAR | Status: AC
  Administered 2012-04-24: 1.2 10*6.[IU] via INTRAMUSCULAR

## 2012-04-24 NOTE — Progress Notes (Signed)
error 

## 2012-04-24 NOTE — Progress Notes (Signed)
Addended by: Elige Radon A on: 04/24/2012 11:55 AM   Modules accepted: Orders, Level of Service

## 2012-05-22 ENCOUNTER — Encounter: Payer: Self-pay | Admitting: Internal Medicine

## 2012-05-22 ENCOUNTER — Ambulatory Visit (INDEPENDENT_AMBULATORY_CARE_PROVIDER_SITE_OTHER): Payer: Medicare Other | Admitting: Internal Medicine

## 2012-05-22 VITALS — BP 148/76 | HR 73 | Temp 97.7°F | Resp 20 | Ht 62.0 in | Wt 134.7 lb

## 2012-05-22 DIAGNOSIS — H9190 Unspecified hearing loss, unspecified ear: Secondary | ICD-10-CM

## 2012-05-22 DIAGNOSIS — N186 End stage renal disease: Secondary | ICD-10-CM

## 2012-05-22 DIAGNOSIS — Z90710 Acquired absence of both cervix and uterus: Secondary | ICD-10-CM

## 2012-05-22 DIAGNOSIS — Z0000 Encounter for general adult medical examination without abnormal findings: Secondary | ICD-10-CM

## 2012-05-22 DIAGNOSIS — H6690 Otitis media, unspecified, unspecified ear: Secondary | ICD-10-CM

## 2012-05-22 DIAGNOSIS — A539 Syphilis, unspecified: Secondary | ICD-10-CM

## 2012-05-22 DIAGNOSIS — R221 Localized swelling, mass and lump, neck: Secondary | ICD-10-CM

## 2012-05-22 DIAGNOSIS — R220 Localized swelling, mass and lump, head: Secondary | ICD-10-CM | POA: Insufficient documentation

## 2012-05-22 DIAGNOSIS — B1710 Acute hepatitis C without hepatic coma: Secondary | ICD-10-CM

## 2012-05-22 DIAGNOSIS — D649 Anemia, unspecified: Secondary | ICD-10-CM

## 2012-05-22 DIAGNOSIS — I10 Essential (primary) hypertension: Secondary | ICD-10-CM

## 2012-05-22 DIAGNOSIS — B20 Human immunodeficiency virus [HIV] disease: Secondary | ICD-10-CM

## 2012-05-22 DIAGNOSIS — E119 Type 2 diabetes mellitus without complications: Secondary | ICD-10-CM

## 2012-05-22 LAB — POCT GLYCOSYLATED HEMOGLOBIN (HGB A1C): Hemoglobin A1C: 5.3

## 2012-05-22 MED ORDER — LISINOPRIL 20 MG PO TABS: 40.0000 mg | ORAL_TABLET | Freq: Every day | ORAL | Status: DC

## 2012-05-22 NOTE — Assessment & Plan Note (Addendum)
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 Was hospitalized again in March 2013 in [place] for the same. At that time her hemoglobin was 5.6 and she required 4 units of blood. She does not have the details, though she says in [address] that they intervened in some way to fix her bleeding. Her last hemoglobin was in fact stable. Denies melena at this time, feels well. - Request records from [facility]

## 2012-05-22 NOTE — Assessment & Plan Note (Addendum)
On Tuesday/Thursday/Saturday dialysis. Followed at [facility] for potential transplant. Says that she had an abnormal stress treadmill echo a few days ago. Denies chest pain or palpitations. - Request records from [facility] - At next visit please fill out records request for [facility]

## 2012-05-22 NOTE — Assessment & Plan Note (Addendum)
A1c today is 5.3 without medicine. - Continue diet control - Continue baby aspirin - Hypertension poorly controlled - Lipids controlled - On dialysis - Needs eye exam arranged in the future, did not have time today - A1c in 3 months

## 2012-05-22 NOTE — Assessment & Plan Note (Signed)
Fleeting bilateral submandibular swelling that lasts for 10 minutes without pain is of unclear etiology. Must maintain suspicion in any HIV-positive patient with possible lymphadenopathy, though I doubt it is LAD given that it only last for 10 minutes. Possibly salivary in nature, the uric suspect pain if she was having inflammation or blockage in the salivary tract. We will continue to follow this problem, and if it is persistent, I would refer to ENT, given that she may need an ENT referral for hearing loss anyway. - Continue to follow - Consider ENT

## 2012-05-22 NOTE — Assessment & Plan Note (Signed)
Patient with chronic difficulty controlling blood pressures secondary to hemodialysis and volume shifts. Had been on as many as 3 antihypertensives, but had problems with hypotension during dialysis. Is currently taking lisinopril 20 mg daily. Her nephrologist, Dr. Jimmy Footman helps manage her blood pressure medicines. - Continue lisinopril 20 - Defer to Dr. Jimmy Footman management

## 2012-05-22 NOTE — Progress Notes (Signed)
Subjective:     Patient ID: [name], female   DOB: 02-Mar-1946, 66 y.o.   MRN: [id number]  HPI Patient is a 66 year old woman with an extensive medical history including well-controlled HIV, HCV, positive RPR, diabetes, hypertension, and ESRD on HD who presents to establish care.  Patient was being followed at [facility], where she was getting her primary care, but she would like a formal primary care provider.  She was last seen by infectious disease in May 2013, at which time her viral load was less than 20 and her CD4 count was 250. She reports good compliance with her HIV medicines. She is also HCV positive. She also has a positive RPR that was treated with IM penicillin.  She is on dialysis Tuesday/Thursday/Saturday, her nephrologist is Dr. Jimmy Footman.  She has diabetes but has not had an A1c drawn in some time. She's not on any medicines. Overall she feels well.  She complains today of hearing loss in left ear for the past 6 months. She was treated for otitis media in February with antibiotics, but her decreased hearing is persisted. She denies tinnitus, dizziness, pain, discharge, nasal congestion, or sinus congestion. She has to turn her head to compensate suffer right ear is forward.  Patient also complains of intermittent left submandibular swelling for the past 2 weeks. The swelling occurs in the bilateral submandibular regions, but left greater than right. The swelling occurs for roughly 10 minutes at a time and has occurred 4 times. She denies any pain during these episodes. She denies fevers, weight loss, decreased energy. She admits to some night sweats.  Review of Systems No chest pain, no palpitations, no dyspnea    Objective:   Physical Exam Filed Vitals:   05/22/12 1500  BP: 148/76  Pulse: 73  Temp: 97.7 F (36.5 C)  Resp: 20   GEN: No apparent distress.  Alert and oriented x 3.  Pleasant, conversant, and cooperative to exam. HEENT: NCAT.  Neck is supple without  palpable masses or lymphadenopathy.  EOMI.  PERRLA.  Sclerae anicteric.  Conjunctivae noninjected. MMM.  Oropharynx is without erythema, exudates, or other abnormal lesions. RESP:  Lungs are clear to ascultation bilaterally with good air movement.  No wheezes, ronchi, or rubs. CARDIOVASCULAR: regular rate, normal rhythm.  Clear S1, S2, no murmurs, gallops, or rubs. ABDOMEN: soft, non-tender, non-distended.  Bowels sounds present in all quadrants and normoactive.  No palpable masses. EXT: warm and dry.  Peripheral pulses equal, intact, and +2 globally.  No clubbing or cyanosis.  No edema in b/l lower extremeties SKIN: warm and dry with normal turgor.  No rashes or abnormal lesions observed.     Assessment:         Plan:

## 2012-05-22 NOTE — Assessment & Plan Note (Signed)
Well controlled, followed by Dr. Tommy Medal. - Continue meds, followup as needed

## 2012-05-22 NOTE — Assessment & Plan Note (Signed)
Patient recently finished 3 weeks IM PCN. Not sure when ID plants recheck titer, but will defer to their plans. Apparently patient has refused LP to rule out neurosyphilis.

## 2012-05-22 NOTE — Assessment & Plan Note (Addendum)
Mammogram: Last in our system in 2011, though with [facility] in 2012 - Request records from [facility]  Colonoscopy: last in 2012, was normal (due in 2022)  Pap: Says she had one this year from [facility] that was abnormal. Says she will be following up with them later this summer. - Request records from [facility]

## 2012-05-22 NOTE — Progress Notes (Signed)
n

## 2012-05-22 NOTE — Assessment & Plan Note (Signed)
Status post hysterectomy in 1982 for fibroids.

## 2012-05-22 NOTE — Assessment & Plan Note (Signed)
May consider a referral to [facility] per Dr. VD. I will leave this up to ID and patient preference.

## 2012-05-22 NOTE — Assessment & Plan Note (Signed)
Patient was treated in February for possible otitis media in the setting of left-sided hearing loss. The patient continues to complain of left-sided hearing loss without other symptoms. On exam her left TM does appear slightly abnormal, though my experience is limited in this arena. - Audiometry evaluation scheduled today -  RTC in 4 weeks to go over audiometry results - Refer to ENT if results are abnormal - Patient also complaining of submandibular swelling, which may be salivary in nature and would also be an ENT problem so referral may be appropriate

## 2012-05-31 ENCOUNTER — Other Ambulatory Visit: Payer: Self-pay | Admitting: *Deleted

## 2012-05-31 ENCOUNTER — Other Ambulatory Visit: Payer: Self-pay | Admitting: Infectious Disease

## 2012-05-31 DIAGNOSIS — B20 Human immunodeficiency virus [HIV] disease: Secondary | ICD-10-CM

## 2012-05-31 MED ORDER — ABACAVIR SULFATE 300 MG PO TABS: 300.0000 mg | ORAL_TABLET | Freq: Two times a day (BID) | ORAL | Status: DC

## 2012-06-05 ENCOUNTER — Ambulatory Visit: Payer: Medicare Other | Attending: Internal Medicine | Admitting: Audiology

## 2012-06-05 DIAGNOSIS — H905 Unspecified sensorineural hearing loss: Secondary | ICD-10-CM | POA: Insufficient documentation

## 2012-06-07 DIAGNOSIS — E119 Type 2 diabetes mellitus without complications: Secondary | ICD-10-CM | POA: Insufficient documentation

## 2012-06-07 DIAGNOSIS — I770 Arteriovenous fistula, acquired: Secondary | ICD-10-CM | POA: Insufficient documentation

## 2012-06-07 DIAGNOSIS — Z9089 Acquired absence of other organs: Secondary | ICD-10-CM | POA: Insufficient documentation

## 2012-06-07 DIAGNOSIS — R9430 Abnormal result of cardiovascular function study, unspecified: Secondary | ICD-10-CM | POA: Insufficient documentation

## 2012-06-07 DIAGNOSIS — Z90710 Acquired absence of both cervix and uterus: Secondary | ICD-10-CM | POA: Insufficient documentation

## 2012-06-07 DIAGNOSIS — I10 Essential (primary) hypertension: Secondary | ICD-10-CM | POA: Insufficient documentation

## 2012-06-20 ENCOUNTER — Telehealth: Payer: Self-pay | Admitting: Infectious Disease

## 2012-06-20 DIAGNOSIS — H908 Mixed conductive and sensorineural hearing loss, unspecified: Secondary | ICD-10-CM

## 2012-06-20 NOTE — Telephone Encounter (Signed)
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 I will put in referral into epic. Can you help arrange this for [name]?

## 2012-06-21 ENCOUNTER — Telehealth: Payer: Self-pay | Admitting: Licensed Clinical Social Worker

## 2012-06-21 NOTE — Telephone Encounter (Signed)
Ok thanks [employer]

## 2012-06-21 NOTE — Telephone Encounter (Signed)
Error

## 2012-06-21 NOTE — Telephone Encounter (Signed)
The audiologist sent the report to her primary doctor and she has an appointment on Monday with her PCP. She has medicaid and they now require patients to have referrals made through their pcp.

## 2012-06-24 ENCOUNTER — Ambulatory Visit (INDEPENDENT_AMBULATORY_CARE_PROVIDER_SITE_OTHER): Payer: Medicare Other | Admitting: Internal Medicine

## 2012-06-24 ENCOUNTER — Encounter: Payer: Self-pay | Admitting: Internal Medicine

## 2012-06-24 VITALS — BP 113/68 | HR 65 | Temp 97.0°F | Ht 63.0 in | Wt 137.2 lb

## 2012-06-24 DIAGNOSIS — E119 Type 2 diabetes mellitus without complications: Secondary | ICD-10-CM

## 2012-06-24 DIAGNOSIS — R220 Localized swelling, mass and lump, head: Secondary | ICD-10-CM

## 2012-06-24 DIAGNOSIS — I10 Essential (primary) hypertension: Secondary | ICD-10-CM

## 2012-06-24 DIAGNOSIS — D649 Anemia, unspecified: Secondary | ICD-10-CM

## 2012-06-24 DIAGNOSIS — A539 Syphilis, unspecified: Secondary | ICD-10-CM

## 2012-06-24 DIAGNOSIS — B20 Human immunodeficiency virus [HIV] disease: Secondary | ICD-10-CM

## 2012-06-24 DIAGNOSIS — N186 End stage renal disease: Secondary | ICD-10-CM

## 2012-06-24 DIAGNOSIS — B1710 Acute hepatitis C without hepatic coma: Secondary | ICD-10-CM

## 2012-06-24 DIAGNOSIS — H9193 Unspecified hearing loss, bilateral: Secondary | ICD-10-CM

## 2012-06-24 DIAGNOSIS — H9190 Unspecified hearing loss, unspecified ear: Secondary | ICD-10-CM

## 2012-06-24 NOTE — Patient Instructions (Signed)
--  Follow up with Dr Tommy Medal --[facility] --Follow up with your ear, nose, and throat appointment. --Please have liver biopsy record sent to [place] --Follow up with [place] in 2 months for repeat diabetes blood test.

## 2012-06-26 ENCOUNTER — Encounter: Payer: Self-pay | Admitting: Internal Medicine

## 2012-06-26 DIAGNOSIS — H9193 Unspecified hearing loss, bilateral: Secondary | ICD-10-CM | POA: Insufficient documentation

## 2012-06-26 NOTE — Assessment & Plan Note (Addendum)
Well-controlled, being followed by Dr. Tommy Medal, the next appointment is August 21, 2012. Her last viral load log was less than 1.3, the last CD4 count was 250 on April 2013. She remains on ART and assures compliance.

## 2012-06-26 NOTE — Assessment & Plan Note (Signed)
Patient with initial blood pressure of 150/73, the repeat of 113/68. She is currently on lisinopril 40 mg daily, carvedilol 6.25 mg once daily (prescribed by Dr. Serita Grammes at [facility]). She describes having blood pressure  as low as 79/ 50 after dialysis. I would allow for transient hypertension in the context of hypotension after dialysis.  No medication changes today.

## 2012-06-26 NOTE — Assessment & Plan Note (Signed)
Patient being followed by Dr. Tommy Medal. Will differ titer recheck.

## 2012-06-26 NOTE — Assessment & Plan Note (Signed)
Lab Results  Component Value Date   HGBA1C 5.3 05/22/2012   HGBA1C  Value: 5.6 (NOTE)                                                                       According to the ADA Clinical Practice Recommendations for 2011, when HbA1c is used as a screening test:   >=6.5%   Diagnostic of Diabetes Mellitus           (if abnormal result  is confirmed)  5.7-6.4%   Increased risk of developing Diabetes Mellitus  References:Diagnosis and Classification of Diabetes Mellitus,Diabetes Care,2011,34(Suppl 1):S62-S69 and Standards of Medical Care in         Diabetes - 2011,Diabetes [id number]  (Suppl 1):S11-S61. 03/21/2011   CREATININE 7.89* 03/27/2012   CREATININE 7.30 DELTA CHECK NOTED* 03/27/2011   CHOL 187 07/31/2011   HDL 75 07/31/2011   TRIG 105 07/31/2011    Last eye exam and foot exam: No results found for this basename: HMDIABEYEEXA, HMDIABFOOTEX    Assessment: Diabetes control: controlled Progress toward goals: at goal Barriers to meeting goals: no barriers identified  Plan: Diabetes treatment: continue management with diet and exercise Refer to: none Instruction/counseling given: discussed foot care

## 2012-06-26 NOTE — Assessment & Plan Note (Signed)
Patient being followed by Dr. Tommy Medal. Patient is asymptomatic during this visit.

## 2012-06-26 NOTE — Progress Notes (Signed)
agree

## 2012-06-26 NOTE — Assessment & Plan Note (Signed)
Patient on Tuesday, Thursday, and Saturday dialysis at [facility]. She continues to be followed by [facility] for possible kidney transplant. She will undergo a liver biopsy tomorrow to reevaluate her hepatitis C status. Patient signed records request for [facility] and [facility].

## 2012-06-26 NOTE — Assessment & Plan Note (Addendum)
Patient denies stool with bright red blood or dark stools. Last hemoglobin stable.  will consider rechecking CBC during next visit.

## 2012-06-26 NOTE — Progress Notes (Signed)
  Subjective:    Patient ID: [name], female    DOB: 07-Jul-1946, 66 y.o.   MRN: [id number]  HPI [name] the 66 year old woman with past medical history is significant for HIV infection, hypertension, diabetes mellitus type II, and end-stage renal disease on HD who comes in today for follow-up of the recent audiology study. During a previous visit to this clinic she was referred to an audiological evaluation which was performed on June 05, 2012. 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 She denies tinnitus, ear pain, or dizziness.  She had no acute complaints today.    Review of Systems  Constitutional: Negative for fever, activity change, appetite change, fatigue and unexpected weight change.  HENT: Positive for hearing loss. Negative for ear pain, rhinorrhea, tinnitus and ear discharge.   Respiratory: Negative for cough and shortness of breath.   Cardiovascular: Negative for chest pain and leg swelling.  Neurological: Negative for dizziness.       Objective:   Physical Exam  Constitutional: She is oriented to person, place, and time. She appears well-developed and well-nourished. No distress.  HENT:  Head: Normocephalic and atraumatic.  Right Ear: External ear normal.  Left Ear: External ear normal.  Nose: Nose normal.  Mouth/Throat: Oropharynx is clear and moist. No oropharyngeal exudate.  Eyes: Conjunctivae are normal. Right eye exhibits no discharge. Left eye exhibits no discharge. No scleral icterus.  Cardiovascular: Normal rate and intact distal pulses.   Pulmonary/Chest: Effort normal and breath sounds normal. No respiratory distress. She has no wheezes. She has no rales. She exhibits no tenderness.  Musculoskeletal: She exhibits no edema.  Neurological: She is alert and oriented to person, place, and time.  Skin: Skin is  warm and dry. She is not diaphoretic. No erythema.  Psychiatric: She has a normal mood and affect.          Assessment & Plan:

## 2012-06-26 NOTE — Assessment & Plan Note (Signed)
Referred to the ENT for further evaluation.  The scheduled repeat audiological evaluation in three months or sooner if other concernes about hearing.

## 2012-06-26 NOTE — Assessment & Plan Note (Signed)
Her symptoms have completely subsided.

## 2012-07-22 ENCOUNTER — Other Ambulatory Visit (HOSPITAL_COMMUNITY): Payer: Self-pay | Admitting: Nephrology

## 2012-07-22 DIAGNOSIS — N186 End stage renal disease: Secondary | ICD-10-CM

## 2012-07-26 ENCOUNTER — Ambulatory Visit (HOSPITAL_COMMUNITY): Payer: Medicare Other

## 2012-08-07 ENCOUNTER — Other Ambulatory Visit: Payer: Medicare Other

## 2012-08-07 ENCOUNTER — Ambulatory Visit (HOSPITAL_COMMUNITY): Admission: RE | Admit: 2012-08-07 | Payer: Medicare Other | Source: Ambulatory Visit

## 2012-08-07 DIAGNOSIS — B20 Human immunodeficiency virus [HIV] disease: Secondary | ICD-10-CM

## 2012-08-07 LAB — COMPLETE METABOLIC PANEL WITH GFR
ALT: 28 U/L (ref 0–35)
AST: 37 U/L (ref 0–37)
Albumin: 3.7 g/dL (ref 3.5–5.2)
Alkaline Phosphatase: 93 U/L (ref 39–117)
BUN: 58 mg/dL — ABNORMAL HIGH (ref 6–23)
CO2: 28 mEq/L (ref 19–32)
Calcium: 10.2 mg/dL (ref 8.4–10.5)
Chloride: 101 mEq/L (ref 96–112)
Creat: 9.74 mg/dL — ABNORMAL HIGH (ref 0.50–1.10)
GFR, Est African American: 4 mL/min — ABNORMAL LOW
GFR, Est Non African American: 4 mL/min — ABNORMAL LOW
Glucose, Bld: 124 mg/dL — ABNORMAL HIGH (ref 70–99)
Potassium: 4.6 mEq/L (ref 3.5–5.3)
Sodium: 146 mEq/L — ABNORMAL HIGH (ref 135–145)
Total Bilirubin: 0.5 mg/dL (ref 0.3–1.2)
Total Protein: 7.6 g/dL (ref 6.0–8.3)

## 2012-08-07 LAB — CBC WITH DIFFERENTIAL/PLATELET
Basophils Relative: 0 % (ref 0–1)
HCT: 35 % — ABNORMAL LOW (ref 36.0–46.0)
Hemoglobin: 11.6 g/dL — ABNORMAL LOW (ref 12.0–15.0)
MCHC: 33.1 g/dL (ref 30.0–36.0)
MCV: 94.6 fL (ref 78.0–100.0)
Monocytes Absolute: 0.4 10*3/uL (ref 0.1–1.0)
Monocytes Relative: 7 % (ref 3–12)
Neutro Abs: 3.6 10*3/uL (ref 1.7–7.7)

## 2012-08-08 LAB — HIV-1 RNA QUANT-NO REFLEX-BLD: HIV-1 RNA Quant, Log: <1.3 {Log} (ref <1.30)

## 2012-08-12 ENCOUNTER — Ambulatory Visit (HOSPITAL_COMMUNITY): Payer: Medicare Other

## 2012-08-16 ENCOUNTER — Ambulatory Visit (HOSPITAL_COMMUNITY): Payer: Medicare Other

## 2012-08-19 ENCOUNTER — Other Ambulatory Visit (HOSPITAL_COMMUNITY): Payer: Self-pay | Admitting: Nephrology

## 2012-08-19 DIAGNOSIS — N186 End stage renal disease: Secondary | ICD-10-CM

## 2012-08-21 ENCOUNTER — Ambulatory Visit: Payer: Medicare Other | Admitting: Infectious Disease

## 2012-08-21 ENCOUNTER — Ambulatory Visit (INDEPENDENT_AMBULATORY_CARE_PROVIDER_SITE_OTHER): Payer: Medicare Other | Admitting: Infectious Disease

## 2012-08-21 ENCOUNTER — Encounter: Payer: Self-pay | Admitting: Infectious Disease

## 2012-08-21 VITALS — BP 164/77 | HR 72 | Temp 97.6°F | Wt 135.5 lb

## 2012-08-21 DIAGNOSIS — B1710 Acute hepatitis C without hepatic coma: Secondary | ICD-10-CM

## 2012-08-21 DIAGNOSIS — H9193 Unspecified hearing loss, bilateral: Secondary | ICD-10-CM

## 2012-08-21 DIAGNOSIS — H9190 Unspecified hearing loss, unspecified ear: Secondary | ICD-10-CM

## 2012-08-21 DIAGNOSIS — A539 Syphilis, unspecified: Secondary | ICD-10-CM

## 2012-08-21 DIAGNOSIS — T148XXA Other injury of unspecified body region, initial encounter: Secondary | ICD-10-CM

## 2012-08-21 DIAGNOSIS — I10 Essential (primary) hypertension: Secondary | ICD-10-CM

## 2012-08-21 DIAGNOSIS — B20 Human immunodeficiency virus [HIV] disease: Secondary | ICD-10-CM

## 2012-08-21 DIAGNOSIS — E785 Hyperlipidemia, unspecified: Secondary | ICD-10-CM

## 2012-08-21 NOTE — Assessment & Plan Note (Signed)
Continue current regimen

## 2012-08-21 NOTE — Assessment & Plan Note (Signed)
Recheck RPR

## 2012-08-21 NOTE — Assessment & Plan Note (Signed)
He is to followup with primary care

## 2012-08-21 NOTE — Assessment & Plan Note (Signed)
Status post insertion of tube with improvement in hearing.

## 2012-08-21 NOTE — Assessment & Plan Note (Signed)
Swollen ankle appears to be consistent with hematoma does not appear to be consistent with infection. Suspicion for other things such as DVT is very low. Asked her to watch this and to let it be seen by her prior care doctor which he sees her in 2 weeks' time.

## 2012-08-21 NOTE — Assessment & Plan Note (Signed)
Check hep C RNA and genotype patient does not want to have interferon therapy at this time.

## 2012-08-21 NOTE — Progress Notes (Signed)
Subjective:    Patient ID: [name], female    DOB: 01/24/1946, 66 y.o.   MRN: [id number]  HPI  66 year old woman with past medical history is significant for HIV infection, hypertension, diabetes mellitus type II, and end-stage renal disease on HD who comes in today for follow-up. Since I last saw her she was found by ENT to have increase fluid behind ears and had tubes placed. She is well controlled on her current regimen of ziagen, epivir and sustiva despite missing 4 doses every month. He refused influenza vaccination today. Last year she had what sounds like a coincidental illness rather than a vaccine reaction. Pressures although today I've asked her to followup with primary care regards to this. She also has a area on her lateral ankle that is tender to palpation likely represent a hematoma.  Review of Systems  Constitutional: Negative for fever, chills, diaphoresis, activity change, appetite change, fatigue and unexpected weight change.  HENT: Negative for congestion, sore throat, rhinorrhea, sneezing, trouble swallowing and sinus pressure.   Eyes: Negative for photophobia and visual disturbance.  Respiratory: Negative for cough, chest tightness, shortness of breath, wheezing and stridor.   Cardiovascular: Negative for chest pain, palpitations and leg swelling.  Gastrointestinal: Negative for nausea, vomiting, abdominal pain, diarrhea, constipation, blood in stool, abdominal distention and anal bleeding.  Genitourinary: Negative for dysuria, hematuria, flank pain and difficulty urinating.  Musculoskeletal: Positive for arthralgias. Negative for myalgias, back pain, joint swelling and gait problem.  Skin: Negative for color change, pallor, rash and wound.  Neurological: Negative for dizziness, tremors, weakness and light-headedness.  Hematological: Negative for adenopathy. Does not bruise/bleed easily.  Psychiatric/Behavioral: Negative for behavioral problems, confusion, disturbed  wake/sleep cycle, dysphoric mood, decreased concentration and agitation.       Objective:   Physical Exam  Constitutional: She is oriented to person, place, and time. She appears well-developed and well-nourished. No distress.  HENT:  Head: Normocephalic and atraumatic.  Mouth/Throat: Oropharynx is clear and moist. No oropharyngeal exudate.  Eyes: Conjunctivae normal and EOM are normal. Pupils are equal, round, and reactive to light. No scleral icterus.  Neck: Normal range of motion. Neck supple. No JVD present.  Cardiovascular: Normal rate, regular rhythm and normal heart sounds.  Exam reveals no gallop and no friction rub.   No murmur heard. Pulmonary/Chest: Effort normal and breath sounds normal. No respiratory distress. She has no wheezes. She has no rales. She exhibits no tenderness.  Abdominal: She exhibits no distension and no mass. There is no tenderness. There is no rebound and no guarding.  Musculoskeletal: She exhibits no edema and no tenderness.       Feet:  Lymphadenopathy:    She has no cervical adenopathy.  Neurological: She is alert and oriented to person, place, and time. She has normal reflexes. She exhibits normal muscle tone. Coordination normal.  Skin: Skin is warm and dry. She is not diaphoretic. No erythema. No pallor.  Psychiatric: She has a normal mood and affect. Her behavior is normal. Judgment and thought content normal.          Assessment & Plan:  HIV INFECTION Continue current regimen  UNSPECIFIED SYPHILIS Recheck RPR  HEPATITIS C Check hep C RNA and genotype patient does not want to have interferon therapy at this time.  ESSENTIAL HYPERTENSION, BENIGN He is to followup with primary care  Hearing loss of both ears Status post insertion of tube with improvement in hearing.  Hematoma Swollen ankle appears to be consistent with  hematoma does not appear to be consistent with infection. Suspicion for other things such as DVT is very low. Asked  her to watch this and to let it be seen by her prior care doctor which he sees her in 2 weeks' time.

## 2012-08-22 LAB — RPR

## 2012-08-23 ENCOUNTER — Ambulatory Visit (HOSPITAL_COMMUNITY): Admission: RE | Admit: 2012-08-23 | Payer: Medicare Other | Source: Ambulatory Visit

## 2012-08-23 LAB — HEPATITIS C RNA QUANTITATIVE: HCV Quantitative Log: 6.74 {Log} — ABNORMAL HIGH (ref <1.63)

## 2012-08-26 ENCOUNTER — Ambulatory Visit (HOSPITAL_COMMUNITY)
Admission: RE | Admit: 2012-08-26 | Discharge: 2012-08-26 | Disposition: A | Discharge status: Home / Self Care | Payer: Medicare Other | Source: Ambulatory Visit | Attending: Nephrology | Admitting: Nephrology

## 2012-08-26 ENCOUNTER — Ambulatory Visit (INDEPENDENT_AMBULATORY_CARE_PROVIDER_SITE_OTHER): Payer: Medicare Other | Admitting: Internal Medicine

## 2012-08-26 ENCOUNTER — Encounter: Payer: Self-pay | Admitting: Internal Medicine

## 2012-08-26 ENCOUNTER — Other Ambulatory Visit (HOSPITAL_COMMUNITY): Payer: Self-pay | Admitting: Nephrology

## 2012-08-26 VITALS — BP 167/75 | HR 72 | Temp 97.0°F | Ht 63.0 in | Wt 137.6 lb

## 2012-08-26 DIAGNOSIS — E785 Hyperlipidemia, unspecified: Secondary | ICD-10-CM

## 2012-08-26 DIAGNOSIS — Z21 Asymptomatic human immunodeficiency virus [HIV] infection status: Secondary | ICD-10-CM | POA: Insufficient documentation

## 2012-08-26 DIAGNOSIS — K219 Gastro-esophageal reflux disease without esophagitis: Secondary | ICD-10-CM

## 2012-08-26 DIAGNOSIS — T148XXA Other injury of unspecified body region, initial encounter: Secondary | ICD-10-CM

## 2012-08-26 DIAGNOSIS — T82898A Other specified complication of vascular prosthetic devices, implants and grafts, initial encounter: Secondary | ICD-10-CM | POA: Insufficient documentation

## 2012-08-26 DIAGNOSIS — H9193 Unspecified hearing loss, bilateral: Secondary | ICD-10-CM

## 2012-08-26 DIAGNOSIS — E119 Type 2 diabetes mellitus without complications: Secondary | ICD-10-CM | POA: Insufficient documentation

## 2012-08-26 DIAGNOSIS — I120 Hypertensive chronic kidney disease with stage 5 chronic kidney disease or end stage renal disease: Secondary | ICD-10-CM | POA: Insufficient documentation

## 2012-08-26 DIAGNOSIS — N186 End stage renal disease: Secondary | ICD-10-CM

## 2012-08-26 DIAGNOSIS — Z992 Dependence on renal dialysis: Secondary | ICD-10-CM | POA: Insufficient documentation

## 2012-08-26 DIAGNOSIS — Z0000 Encounter for general adult medical examination without abnormal findings: Secondary | ICD-10-CM

## 2012-08-26 DIAGNOSIS — B1710 Acute hepatitis C without hepatic coma: Secondary | ICD-10-CM

## 2012-08-26 DIAGNOSIS — B20 Human immunodeficiency virus [HIV] disease: Secondary | ICD-10-CM

## 2012-08-26 DIAGNOSIS — I10 Essential (primary) hypertension: Secondary | ICD-10-CM

## 2012-08-26 DIAGNOSIS — H9190 Unspecified hearing loss, unspecified ear: Secondary | ICD-10-CM

## 2012-08-26 DIAGNOSIS — I871 Compression of vein: Secondary | ICD-10-CM | POA: Insufficient documentation

## 2012-08-26 DIAGNOSIS — Y832 Surgical operation with anastomosis, bypass or graft as the cause of abnormal reaction of the patient, or of later complication, without mention of misadventure at the time of the procedure: Secondary | ICD-10-CM | POA: Insufficient documentation

## 2012-08-26 LAB — GLUCOSE, CAPILLARY: Glucose-Capillary: 121 mg/dL — ABNORMAL HIGH (ref 70–99)

## 2012-08-26 MED ORDER — IOHEXOL 300 MG/ML  SOLN
100.0000 mL | Freq: Once | INTRAMUSCULAR | Status: AC | PRN
  Administered 2012-08-26: 80 mL via INTRAVENOUS

## 2012-08-26 NOTE — Procedures (Signed)
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   2.) Successful PTA of graft and venous anastomosis to 6 mm and brachiocephalic vein to 12 mm Complications: None Recommendations: - May resume dialysis - Return to IR for any recurrent decreased access flows as this would be a sign of recurrent stenosis  Signed,  Criselda Peaches, MD Vascular & Interventional Radiologist [facility] Radiology

## 2012-08-26 NOTE — Patient Instructions (Addendum)
-  Refrain from scratching your legs. This could be topical allergic reaction socks, clothing, shoes. -Start taking Lisinopril twice per day -Follow up with [place] in 2-4 weeks for your BP and results of today's labs.  -Take Lisinopril 40mg  on Monday, Wedsnesday, Friday, and Sunday, take 20mg  of Lisinopril on Tuesday, Thursday, and Saturday

## 2012-08-26 NOTE — H&P (Signed)
Interventional Radiology Pre-Procedure H&P  Reason for Consult: Decreased access flows during dialysis Referring Physician: Dr. Jimmy Footman   HPI: [name] is an 66 y.o. female with a history of HIV and ESRD secondary to HTN and DM Type 2 on hemodialysis via a RUE straight AV graft. Her current access is a 6 mm Gore-Tex brachial artery to axillary vein graft placed 08/30/11 by Dr. Kellie Simmering.  She presents for decreased access flows during dialysis.  Diagnostic shuntogram shows a stenosis at the venous anastomosis and also possible stenoses of the subclavian and brachiocephalic veins. She reports she did eat breakfast this morning.   Past Medical History:  Past Medical History  Diagnosis Date  . HIV infection     Dx in 1998 in [place], she presented with PCP pneumonia at that time. Has been tried on multiple regimens by her PCP in [place] before./ She has been on current ART for years now. Moved to [facility] in 2008 and is following with Dr. Tommy Medal since then.   . Hepatitis C     untreated. VL [id number] in 2008  . Diabetes mellitus     Las HbA1C   . Hypertension   . Chronic kidney disease     ESRD secondary to DM, started HD in 2008, HD TTS, Dr. Jimmy Footman is her nephrologist, on transplant list at [facility].  . Dialysis patient     T Th Sat    Surgical History:  Past Surgical History  Procedure Date  . Abdominal hysterectomy     and oopherectomy for fibroids  . Cholecystectomy     Family History:  Family History  Problem Relation Age of Onset  . Diabetes Mother   . Cancer Father   . Cancer Brother     Social History:  reports that she quit smoking about 15 years ago. She has never used smokeless tobacco. She reports that she does not drink alcohol or use illicit drugs.  Allergies:  Allergies  Allergen Reactions  . Influenza Virus Vacc Split Pf     Alleged severe illness in [place]. Do not have documentation from [place] but pt always refuses vaccine on these grounds    Medications: I have  reviewed the patient's current medications.  ROS: See HPI for pertinent findings, otherwise complete 10 system review negative.  Physical Exam: There were no vitals taken for this visit.     Labs: CBC No results found for this basename: WBC:2,HGB:2,HCT:2,PLT:2 in the last 72 hours MET No results found for this basename: NA:2,K:2,CL:2,CO2:2,GLUCOSE:2,BUN:2,CREATININE:2,CALCIUM:2 in the last 72 hours No results found for this basename: PROT,ALBUMIN,AST,ALT,ALKPHOS,BILITOT,BILIDIR,IBILI,LIPASE in the last 72 hours PT/INR No results found for this basename: LABPROT:2,INR:2 in the last 72 hours ABG No results found for this basename: PHART:2,PCO2:2,PO2:2,HCO3:2 in the last 72 hours    No results found.  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  - Therapeutic venous PTA, possible stenting - Pt has agreed to proceed without sedation    Signed,  Criselda Peaches, MD Vascular & Interventional Radiologist [facility]

## 2012-08-26 NOTE — Progress Notes (Signed)
Subjective:    Patient ID: [name], female    DOB: May 03, 1946, 66 y.o.   MRN: [id number]  HPI [name] is a 66 yo woman with PMH significant for ESRD (on HD T, Th, and Sat), HIV, Hep C, GERD,  HTN, who comes in today for follow up of her BP and decreased hearing. Since her last visit in this clinic she has been seen by Dr. Benjamine Mola, ENT specialist, with fluid removal and placement of tube in her left ear. She reports that her hearing has improved, she will follow up with Dr. Benjamine Mola in six months.   She has also seen Dr. Tommy Medal, [place] specialist, who has discussed her Hep C status and treatment option with interferon, which [name] declines at this time secondary to the possible side effects.  Dr. Tommy Medal also noted a lump in her left ankle. This lump has now resolved but she complains of a lump on her lateral shin, ~3 inches from her ankle, that is tender but is reported as shrinking in size.   Her BP continues to reach low 70/50s after her dialysis treatment. She has been taking Coreg only once per day and half the prescribed dose of Lisinopril.   Her GERD symptoms have worsened in the past weeks and she is now taking Prilosec everyday instead of every other day. Sometimes she feels as if the food is not quite "going down" after she eats. She avoids eating before going to bed and occasionally consumes colas or spicy foods, but enjoys chocolate at times. Her symptoms are worse at night but she experienced reflux only once in the past month. She has history of a bleeding AVM that was last evaluated and managed in [place], in 02/2012.   She had a successful balloon angioplasty of a stenotic lesion in her right dialysis graft this morning and reports minimum pain at this time.   Review of Systems  Constitutional: Negative for fever, chills, diaphoresis, activity change, appetite change, fatigue and unexpected weight change.  HENT: Negative for ear pain and ear discharge.   Respiratory: Positive for  cough. Negative for chest tightness and shortness of breath.        Non-productive cough that started today  Cardiovascular: Negative for chest pain, palpitations and leg swelling.  Gastrointestinal: Negative for nausea, vomiting and abdominal pain.  Genitourinary: Negative for dysuria.       She urinates 2-3 per day.  Neurological: Negative for dizziness, light-headedness and headaches.  Psychiatric/Behavioral: Negative for behavioral problems.       Objective:   Physical Exam  Constitutional: She is oriented to person, place, and time. She appears well-developed and well-nourished. No distress.  Eyes: Conjunctivae normal are normal. Right eye exhibits no discharge. Left eye exhibits no discharge. No scleral icterus.  Cardiovascular: Normal rate and regular rhythm.   Murmur heard.      AVG covered with gauze which is dry/clean/intact  Pulmonary/Chest: Effort normal and breath sounds normal. No respiratory distress. She has no wheezes. She has no rales. She exhibits no tenderness.  Abdominal: Soft. Bowel sounds are normal. There is no tenderness.  Musculoskeletal: She exhibits no edema.  Neurological: She is alert and oriented to person, place, and time.  Skin: Skin is warm and dry. No rash noted. She is not diaphoretic. No erythema. No pallor.       A ~1inch in diameter tender at the lateral right LE almost 3 inches above her ankle with no erythema or surrounding  edema.   Psychiatric: She has a normal mood and affect. Her behavior is normal.          Assessment & Plan:

## 2012-08-27 DIAGNOSIS — K219 Gastro-esophageal reflux disease without esophagitis: Secondary | ICD-10-CM | POA: Insufficient documentation

## 2012-08-27 DIAGNOSIS — E785 Hyperlipidemia, unspecified: Secondary | ICD-10-CM | POA: Insufficient documentation

## 2012-08-27 LAB — LIPID PANEL
Cholesterol: 172 mg/dL (ref 0–200)
HDL: 54 mg/dL (ref >39)
LDL Cholesterol: 86 mg/dL (ref 0–99)
VLDL: 32 mg/dL (ref 0–40)

## 2012-08-27 LAB — HEPATITIS C GENOTYPE

## 2012-08-27 NOTE — Assessment & Plan Note (Signed)
Left ankle hematoma resolved. Will continue to monitor lump in lateral left LE above her ankle.

## 2012-08-27 NOTE — Assessment & Plan Note (Signed)
She is being followed by Dr. Tommy Medal, treatment has been discussed and the pt reports understanding/deciding that the tx options may not be beneficial for her at this time.

## 2012-08-27 NOTE — Assessment & Plan Note (Signed)
Improved. Pt being followed by Dr. Benjamine Mola, now s/p tube placement in L ear. She will follow up with ENT in 6 months.

## 2012-08-27 NOTE — Assessment & Plan Note (Addendum)
Pt being followed by Dr. Tommy Medal. Last CD4 count on 08/07/12 at 190, however, pt not started on PCP prophylaxis at this time, viral load <20. Pt assures compliance most days, except for evening dose of her abacavir which she forgets to take 3-4 times per month.   Will defer prophylaxis decision to ID specialist.

## 2012-08-27 NOTE — Assessment & Plan Note (Signed)
Pt discloses always taking half the prescribed dose of Coreg and Lisinopril, she has had low BP (70/50s after dialysis). Advised her to take Lisinopril 40mg  on M, W, F, Sunday, the days she does NOT have dialysis but to take Coreg 6.25mg  BID everyday. She was also advised to report low BP during her dialysis to her Nephrologist on site.  Will reassess BP during next visit.

## 2012-08-27 NOTE — Assessment & Plan Note (Addendum)
Pt on Crestor, denies leg cramps or pain. Her lipid profile today shows LDL at goal <100, but elevated triglycerides. Will further discuss medication compliance and diet modification during her next visit.

## 2012-08-27 NOTE — Assessment & Plan Note (Addendum)
She declined Tdap today, will consider during her next visit.  Her last colonoscopy was done in 3/13 in [place] for evaluation of AVM but she had a colonoscopy before by Dr. Benson Norway here in [place].  She does not want a flu shot, she believes she had a reaction with this vaccine before (although likely coincidental illness).  Her most recent mammogram was a few months ago, per her reports, and was normal. Will attempt to obtain the records from the [facility].

## 2012-08-27 NOTE — Assessment & Plan Note (Signed)
POC HbA1C today at 5. Continue management with diet and exercise.

## 2012-08-27 NOTE — Assessment & Plan Note (Addendum)
Referred to GI for possible repeat EGD

## 2012-08-28 NOTE — Progress Notes (Signed)
INTERNAL MEDICINE TEACHING ATTENDING ADDENDUM [name] , MD: I personally saw and evaluated SusanHernandez in this clinic visit in conjunction with the resident, Dr. Hayes [name]. I have discussed the patient's plan of care with Dr. Hayes [name] during this visit. I have confirmed the physical exam findings and have read and agree with the clinic note including the plan.

## 2012-09-10 ENCOUNTER — Encounter: Payer: Self-pay | Admitting: Internal Medicine

## 2012-09-20 ENCOUNTER — Ambulatory Visit (INDEPENDENT_AMBULATORY_CARE_PROVIDER_SITE_OTHER): Payer: Medicare Other | Admitting: Internal Medicine

## 2012-09-20 ENCOUNTER — Encounter: Payer: Self-pay | Admitting: Internal Medicine

## 2012-09-20 VITALS — BP 138/78 | HR 67 | Temp 97.5°F | Ht 63.0 in | Wt 138.6 lb

## 2012-09-20 DIAGNOSIS — J309 Allergic rhinitis, unspecified: Secondary | ICD-10-CM

## 2012-09-20 DIAGNOSIS — E785 Hyperlipidemia, unspecified: Secondary | ICD-10-CM

## 2012-09-20 DIAGNOSIS — T148XXA Other injury of unspecified body region, initial encounter: Secondary | ICD-10-CM

## 2012-09-20 DIAGNOSIS — I10 Essential (primary) hypertension: Secondary | ICD-10-CM

## 2012-09-20 DIAGNOSIS — B20 Human immunodeficiency virus [HIV] disease: Secondary | ICD-10-CM

## 2012-09-20 DIAGNOSIS — N186 End stage renal disease: Secondary | ICD-10-CM

## 2012-09-20 DIAGNOSIS — K219 Gastro-esophageal reflux disease without esophagitis: Secondary | ICD-10-CM

## 2012-09-20 DIAGNOSIS — E119 Type 2 diabetes mellitus without complications: Secondary | ICD-10-CM

## 2012-09-20 DIAGNOSIS — Z01818 Encounter for other preprocedural examination: Secondary | ICD-10-CM | POA: Insufficient documentation

## 2012-09-20 DIAGNOSIS — J302 Other seasonal allergic rhinitis: Secondary | ICD-10-CM | POA: Insufficient documentation

## 2012-09-20 DIAGNOSIS — Z0000 Encounter for general adult medical examination without abnormal findings: Secondary | ICD-10-CM

## 2012-09-20 LAB — GLUCOSE, CAPILLARY: Glucose-Capillary: 111 mg/dL — ABNORMAL HIGH (ref 70–99)

## 2012-09-20 MED ORDER — FLUTICASONE PROPIONATE 50 MCG/ACT NA SUSP: 1.0000 | Freq: Every day | NASAL | Status: DC

## 2012-09-20 NOTE — Assessment & Plan Note (Signed)
Last HbA1C at 5.0. Discussed this result with the patient and encouraged her to continue following a healthy diet. No medications started at this time. Will recheck her HbA1C in January 2014.

## 2012-09-20 NOTE — Assessment & Plan Note (Signed)
This seems to reoccur during early Fall season. She had good relief with Flonase in the past.  Reordered Flonase nasal spray, no refills.  Will consider referring her to the [facility] during her next visit.

## 2012-09-20 NOTE — Progress Notes (Signed)
Subjective:    Patient ID: [name], female    DOB: 29-Jul-1946, 66 y.o.   MRN: [id number]  HPI [name] is a very pleasant 66 year-old woman with PMH of HIV infection, Hepatitis C infection, DM2, HTN, GERD, and ESRD on HD T/Th/Sat, who comes in today for a follow up visit for her blood pressure. In addition, she has been monitoring a lump on the lateral aspect of her left lower leg that is not painful and is improving. This week she changed her HD scheduled to M/W/F and M due to an important kidney center function she had to attend yesterday day and a church gala she will be attending tomorrow (Saturday).   Since she was last seen here she has taken Lisinopril twice per day (a 20mg  tablet twice per day) on the days she is off HD and Coreg daily although the has not taken Coreg BID as she did not know she could take both Lisinopril and Coreg tablets at the same time. She continues to have BP in the 70s/50s during her HD. She will speak to her Nephrologist about her low BP during HD.   She continues to have mild nasal congestions that have started with the change in season. She has not used Flonase as she did not have a refill for this medications. In the past, she had similar symptoms in the early Fall that responded well to Flonase nasal spray.   She had no other complaints today.    Review of Systems  Constitutional: Negative for fever, chills, diaphoresis, activity change and appetite change.  HENT: Positive for congestion and rhinorrhea. Negative for nosebleeds, sore throat, postnasal drip and sinus pressure.   Eyes: Negative for itching.  Respiratory: Negative for cough and shortness of breath.   Cardiovascular: Negative for chest pain and leg swelling.  Gastrointestinal: Negative for abdominal pain.  Musculoskeletal: Negative for gait problem.  Neurological: Positive for dizziness. Negative for weakness, light-headedness and headaches.  Psychiatric/Behavioral: Negative for  agitation. The patient is not nervous/anxious.        Objective:   Physical Exam  Constitutional: She is oriented to person, place, and time. She appears well-developed and well-nourished. No distress.  HENT:  Mouth/Throat: Oropharynx is clear and moist.       Nasal turbinates mildly erythematous and edematous with no discharge.   Eyes: Conjunctivae normal are normal. Pupils are equal, round, and reactive to light. Right eye exhibits no discharge. Left eye exhibits no discharge. No scleral icterus.  Neck: Neck supple. No tracheal deviation present. No thyromegaly present.  Cardiovascular: Normal rate, regular rhythm and intact distal pulses.        Right brachial AVG with bruit and palpable thrill with no surrounding edema or erythema.    Pulmonary/Chest: Effort normal and breath sounds normal. No respiratory distress. She has no wheezes. She has no rales. She exhibits no tenderness.  Abdominal: Soft. Bowel sounds are normal. She exhibits no distension and no mass. There is no tenderness. There is no rebound and no guarding.  Musculoskeletal: She exhibits no edema.  Lymphadenopathy:    She has no cervical adenopathy.  Neurological: She is alert and oriented to person, place, and time. Coordination normal.  Skin: Skin is warm and dry. No rash noted. She is not diaphoretic. No erythema. No pallor.       A ~2cm firm, non tender lump is still present in the latera aspect of her left lower extremity. There is no edema or erythema  associated with this lump and it has decreased in size since her last visit.   Psychiatric: She has a normal mood and affect. Her behavior is normal.          Assessment & Plan:

## 2012-09-20 NOTE — Assessment & Plan Note (Signed)
Her left LE hematoma/lump is improving, becoming smaller. Will continue to monitor.

## 2012-09-20 NOTE — Patient Instructions (Signed)
-  You may take Lisinopril and Coreg together. Take Lisinopril on the days you do not have dialysis to prevent low blood pressures.  -Please return on Tuesday next week for your Tdap. You do not need an appointment with a doctor for your vaccination.  -Return in 5-6 weeks for blood pressure recheck and upper endoscopy discussion.  -Have a wonderful weekend!

## 2012-09-20 NOTE — Assessment & Plan Note (Signed)
She denies muscle cramps or pain that could be associated with SE of her statin. Her last lipid profiled showed LDL of <100 (86), HDL>50 (54), and TG of 159, mildly elevated. She was counseled on avoiding saturating fats such as fats in fried foods but encouraged to consume monosaturated/polysaturated fats such as those in olive oil.  Continue Crestor

## 2012-09-20 NOTE — Assessment & Plan Note (Signed)
Her AVG with bruit and palpable thrill, s/p angioplasty, patent, recently accessed for HD. She changed her HD this week to M/W/F/M for important events, and will resume her regular schedule on Thursday 10/24.

## 2012-09-20 NOTE — Assessment & Plan Note (Signed)
Patient followed by Dr. Tommy Medal with appointment for early 2014. Her last CD4 count was 190, however, her HIV viral load has been undetectable for years now. Will not start PCP prophylaxis but defer this decision to her ID specialist.

## 2012-09-20 NOTE — Assessment & Plan Note (Signed)
Her symptoms have improved somewhat but she continues to take omeprazole everyday. She has an appointment with Dr. Ulice Dash in Gastroenterology for a possible repeat EGD.

## 2012-09-20 NOTE — Assessment & Plan Note (Signed)
Patient with BP initially elevated today, down to 138/78, her goal is for BP <140/90 (questionable if DBP of 80 given DM2 but HbA1C of 5.0). Patient was instructed to take Coreg BID instead of once daily only given its short half life. She will continue taking Lisinopril 20mg  BID on the days she does not have dialysis. She will follow up with her dialysis center Nephrologist for low BPs during HD.  She will follow up with the [facility] in 5-6 weeks for BP recheck.

## 2012-09-23 NOTE — Progress Notes (Signed)
I agree with Dr. Bernadene Bell plan for this patient at this time.  Thanks.

## 2012-09-24 ENCOUNTER — Ambulatory Visit: Payer: Medicare Other

## 2012-09-24 DIAGNOSIS — D6862 Lupus anticoagulant syndrome: Secondary | ICD-10-CM | POA: Insufficient documentation

## 2012-09-24 DIAGNOSIS — Z940 Kidney transplant status: Secondary | ICD-10-CM | POA: Insufficient documentation

## 2012-09-24 DIAGNOSIS — R894 Abnormal immunological findings in specimens from other organs, systems and tissues: Secondary | ICD-10-CM | POA: Insufficient documentation

## 2012-09-26 ENCOUNTER — Other Ambulatory Visit: Payer: Self-pay | Admitting: *Deleted

## 2012-09-26 DIAGNOSIS — B20 Human immunodeficiency virus [HIV] disease: Secondary | ICD-10-CM

## 2012-09-26 MED ORDER — LAMIVUDINE 10 MG/ML PO SOLN: 25.0000 mg | Freq: Every day | ORAL | Status: DC

## 2012-09-28 DIAGNOSIS — IMO0002 Reserved for concepts with insufficient information to code with codable children: Secondary | ICD-10-CM | POA: Insufficient documentation

## 2012-10-03 ENCOUNTER — Ambulatory Visit: Payer: Medicare Other | Admitting: Internal Medicine

## 2012-10-07 ENCOUNTER — Encounter: Payer: Self-pay | Admitting: Internal Medicine

## 2012-10-07 ENCOUNTER — Telehealth: Payer: Self-pay | Admitting: *Deleted

## 2012-10-07 ENCOUNTER — Ambulatory Visit (INDEPENDENT_AMBULATORY_CARE_PROVIDER_SITE_OTHER): Payer: Medicare Other | Admitting: Internal Medicine

## 2012-10-07 VITALS — BP 183/85 | HR 75 | Temp 97.1°F | Ht 63.0 in | Wt 147.4 lb

## 2012-10-07 DIAGNOSIS — R112 Nausea with vomiting, unspecified: Secondary | ICD-10-CM | POA: Insufficient documentation

## 2012-10-07 LAB — CBC WITH DIFFERENTIAL/PLATELET
Hemoglobin: 9.6 g/dL — ABNORMAL LOW (ref 12.0–15.0)
Lymphocytes Relative: 6 % — ABNORMAL LOW (ref 12–46)
Lymphs Abs: 0.5 10*3/uL — ABNORMAL LOW (ref 0.7–4.0)
MCH: 29.7 pg (ref 26.0–34.0)
Monocytes Relative: 3 % (ref 3–12)
Neutro Abs: 8.2 10*3/uL — ABNORMAL HIGH (ref 1.7–7.7)
Neutrophils Relative %: 91 % — ABNORMAL HIGH (ref 43–77)
Platelets: 200 10*3/uL (ref 150–400)
RBC: 3.23 MIL/uL — ABNORMAL LOW (ref 3.87–5.11)
WBC: 9.1 10*3/uL (ref 4.0–10.5)

## 2012-10-07 LAB — COMPLETE METABOLIC PANEL WITH GFR
ALT: 15 U/L (ref 0–35)
AST: 28 U/L (ref 0–37)
BUN: 23 mg/dL (ref 6–23)
CO2: 27 mEq/L (ref 19–32)
Calcium: 10.1 mg/dL (ref 8.4–10.5)
Chloride: 96 mEq/L (ref 96–112)
Creat: 6.69 mg/dL — ABNORMAL HIGH (ref 0.50–1.10)
GFR, Est African American: 7 mL/min — ABNORMAL LOW
Total Bilirubin: 0.3 mg/dL (ref 0.3–1.2)

## 2012-10-07 NOTE — Assessment & Plan Note (Signed)
Patient is currently having nausea and vomiting for last 2 days. The differentials for nausea and vomiting are really wide in the patient who recently had kidney transplant. CMV infection, immunosuppressant drug toxicity, acute renal failure from graft failure, drug toxicity or side effect and gastroenteritis or some of the differentials. Dr. Drucilla [name] who was on call for infectious disease was called for an opinion and management of posttransplant patients. [name] is Dr. Arlyss [name] patient who she sees for HIV and hepatitis C. We talked to the transplant coordinator at [facility] and told them about the situation. The transplant coordinator suggested that we should encourage her to make an appointment at [facility] transplant center. Some of the medicines that patient has been taking are Prograf, prednisone and mycophenolate. Mycophenolate is known to cause GI upset due to drug toxicity and is usually managed by decreasing the dose gradually by a specialist in a controlled setting.  We drew a CBC with differential, metabolic profile and drug levels to rule out some of the differentials as discussed above. Patient was sent home with instructions to call her transplant coordinator to coordinate her care. Patient will be called with the results if they are any surprises noted. He discharged the patient home with supportive medications.  A total of 2 hours were spent in coordinating the above things.

## 2012-10-07 NOTE — Telephone Encounter (Signed)
Pt c/o N/Vomiting since yesterday.  This only happens in morning. She is able to keep her meds down. Denies fever, no other symptoms. Pt called [facility] and they wanted her to be seen by PCP r/o virus. Hx : kidney transplant 09/21/12  Will see today @ 1:30

## 2012-10-07 NOTE — Telephone Encounter (Signed)
Ok, thanks.

## 2012-10-07 NOTE — Patient Instructions (Addendum)
[name],  We believe that your nausea and vomiting is most likely due to the mycophenolate that was prescribed to you by the transplant team at [facility] for your recent kidney transplant. We have spoken to them about your condition, and they recommend coming to the transplant clinic to further tailor your medication so that it is at a level that you are able to tolerate. If you would like to get into contact with them to set up an appointment, you may call [phone].

## 2012-10-07 NOTE — Progress Notes (Signed)
  Subjective:    Patient ID: [name], female    DOB: February 20, 1946, 66 y.o.   MRN: [id number]  HPI  Patient is a 66 year old female with past medical history most significant for end-stage renal disease who recently underwent kidney transplant 2 weeks ago at [facility]. Patient does not know what medications she is on at this time. She did not bring any of her medications and her daughter is not with her at this time. Patient sees that she has been having nausea and vomiting for last 2 days. She has been taking all her medications as prescribed. She wanted about 3-4 times yesterday and the vomitus contained food particles. She she had one episode of vomiting this morning but was able to keep her breakfast down after that. Patient continues with complaints of nausea.  She called the transplant coordinator at [facility] transplant center and was told to make an appointment with her primary care physician to get herself checked out for "viral illness".    Review of Systems  Constitutional: Positive for appetite change. Negative for fever, chills and activity change.  HENT: Negative for sore throat.   Respiratory: Negative for cough and shortness of breath.   Cardiovascular: Negative for chest pain and leg swelling.  Gastrointestinal: Positive for nausea and vomiting. Negative for abdominal pain, diarrhea, constipation and abdominal distention.  Genitourinary: Negative for frequency, hematuria and difficulty urinating.  Neurological: Negative for dizziness and headaches.  Psychiatric/Behavioral: Negative for suicidal ideas and behavioral problems.       Objective:   Physical Exam  Constitutional: She is oriented to person, place, and time. She appears well-developed and well-nourished.  HENT:  Head: Normocephalic and atraumatic.  Eyes: Conjunctivae normal and EOM are normal. Pupils are equal, round, and reactive to light. No scleral icterus.  Neck: Normal range of motion. Neck supple. No JVD  present. No thyromegaly present.       Patient has bandages over the right lateral neck covering the sites of central venous catheter insertions.  Cardiovascular: Normal rate, regular rhythm, normal heart sounds and intact distal pulses.  Exam reveals no gallop and no friction rub.   No murmur heard. Pulmonary/Chest: Effort normal and breath sounds normal. No respiratory distress. She has no wheezes. She has no rales.  Abdominal: Soft. Bowel sounds are normal. She exhibits no distension and no mass. There is no tenderness. There is no rebound and no guarding.       Scar from the transplant surgery is healing well.  Musculoskeletal: Normal range of motion. She exhibits no edema and no tenderness.  Lymphadenopathy:    She has no cervical adenopathy.  Neurological: She is alert and oriented to person, place, and time.  Psychiatric: She has a normal mood and affect. Her behavior is normal.          Assessment & Plan:

## 2012-10-08 NOTE — Progress Notes (Signed)
Notes and labs faxed to [facility] [phone] per medical student. Hilda Blades Akaisha Truman RN 10/08/12/10:30AM

## 2012-10-08 NOTE — Progress Notes (Signed)
Subjective- [name] is a 66 yo lady with a hx of ESR s/p kidney transplant, DM type ii, HTN, HLP, GERD, HIV and HCV that presented to the clinic with nausea and vomiting.  Her nausea and vomiting began abruptly yesterday morning. She never had something like this in the past. There is no readily apparent trigger. She has not had fevers, chills, abdominal pain, or distension. Her appetite has been stable, and she had a normal BM earlier this afternoon. She remains on HD Tu-Th-Sat. She received her kidney transplant on 10/19 at [facility]. Her phosphate binders were discontinued after the transplant. Her antiviral medication regimen has not recently changed. Her immunosuppressant medications are myfordic 180 mg (2 at breakfast and 2 at lunch), prednisone 5mg  qd, prograft 6mg  q12 hrs.   Objective- Vitals- significant for bp 183/85 Cardio- RRR, slight peripheral edema in lower extremities Lungs- CTAB, good air movement Abd- hypoactive bowel sounds, ~5 inch surgical on right lower abdomen, wound c/d/i, no local erythema. No tenderness on left abdomen, mild tenderness over right abdomen,  Neuro- PERRL, EOMI, CN II-XII grossly intact. 5+ strength throughout, sensation globally intact  Assessment/Plan- **Nausea/Vomiting- There is not a clear source of Ms. Munns's nausea and vomiting. She does not appear to viral gastroenteritis, as she has not had symptoms such as fevers, chills, or diarrhea. While her antiviral medications may cause GI upset, she has not had any recent changes in her med regimen. The most recent changes to her medication regimen include the addition of immunosuppressants, the removal of phosphate binders.  With the recent kidney transplant, other considerations include rejection or electrolyte abnormalities. We will draw up an electrolyte panel to look for potential culprits. In the meantime, we have contacted the transplant coordinator at [facility], and they have helped to get her an appointment  with her transplant team, scheduled for 11/6.

## 2012-10-09 LAB — TACROLIMUS LEVEL: Tacrolimus Lvl: 13.1 ng/mL (ref 5.0–20.0)

## 2012-10-11 ENCOUNTER — Ambulatory Visit: Payer: Medicare Other | Admitting: Internal Medicine

## 2012-10-14 ENCOUNTER — Encounter: Payer: Medicare Other | Admitting: Internal Medicine

## 2012-10-15 NOTE — Addendum Note (Signed)
Addended by: Hulan Fray on: 10/15/2012 05:40 PM   Modules accepted: Orders

## 2012-10-25 ENCOUNTER — Telehealth: Payer: Self-pay | Admitting: *Deleted

## 2012-10-25 NOTE — Telephone Encounter (Signed)
Pt called requesting testing strip refill, looking over her med list- no strips, no meds for diab, i ask her about this and she states that she was put on insulin at [facility] and was given the meter at [facility], i ask if she told dr Cathren Laine when she was in 11/4 and she states no, appt is made for pt 12/2 to update med list and care provided by [facility], she will call her md at [facility] for refill

## 2012-10-25 NOTE — Telephone Encounter (Signed)
Agree with follow up appointment. Thanks! SK

## 2012-11-04 ENCOUNTER — Ambulatory Visit: Payer: Medicare Other | Admitting: Internal Medicine

## 2012-12-18 DIAGNOSIS — Z792 Long term (current) use of antibiotics: Secondary | ICD-10-CM | POA: Insufficient documentation

## 2013-01-09 ENCOUNTER — Encounter: Payer: Self-pay | Admitting: Internal Medicine

## 2013-01-09 ENCOUNTER — Telehealth: Payer: Self-pay | Admitting: Infectious Disease

## 2013-01-09 ENCOUNTER — Ambulatory Visit (INDEPENDENT_AMBULATORY_CARE_PROVIDER_SITE_OTHER): Payer: Medicare Other | Admitting: Internal Medicine

## 2013-01-09 VITALS — BP 178/84 | HR 61 | Temp 97.1°F | Ht 63.0 in | Wt 133.9 lb

## 2013-01-09 DIAGNOSIS — Z0000 Encounter for general adult medical examination without abnormal findings: Secondary | ICD-10-CM

## 2013-01-09 DIAGNOSIS — Z940 Kidney transplant status: Secondary | ICD-10-CM

## 2013-01-09 DIAGNOSIS — I10 Essential (primary) hypertension: Secondary | ICD-10-CM

## 2013-01-09 DIAGNOSIS — E119 Type 2 diabetes mellitus without complications: Secondary | ICD-10-CM

## 2013-01-09 DIAGNOSIS — N186 End stage renal disease: Secondary | ICD-10-CM

## 2013-01-09 DIAGNOSIS — Z21 Asymptomatic human immunodeficiency virus [HIV] infection status: Secondary | ICD-10-CM

## 2013-01-09 DIAGNOSIS — B20 Human immunodeficiency virus [HIV] disease: Secondary | ICD-10-CM

## 2013-01-09 LAB — BASIC METABOLIC PANEL WITH GFR
Chloride: 106 mEq/L (ref 96–112)
GFR, Est African American: 44 mL/min — ABNORMAL LOW
GFR, Est Non African American: 38 mL/min — ABNORMAL LOW
Glucose, Bld: 178 mg/dL — ABNORMAL HIGH (ref 70–99)
Potassium: 4.1 mEq/L (ref 3.5–5.3)
Sodium: 134 mEq/L — ABNORMAL LOW (ref 135–145)

## 2013-01-09 LAB — GLUCOSE, CAPILLARY: Glucose-Capillary: 144 mg/dL — ABNORMAL HIGH (ref 70–99)

## 2013-01-09 LAB — POCT GLYCOSYLATED HEMOGLOBIN (HGB A1C): Hemoglobin A1C: 9.3

## 2013-01-09 MED ORDER — FUROSEMIDE 20 MG PO TABS: 20.0000 mg | ORAL_TABLET | Freq: Every day | ORAL | Status: DC

## 2013-01-09 NOTE — Telephone Encounter (Signed)
[name] when is [name]'s followup visit with me? Her kidney fxn seems suddenly mUCH much better and it looks like her HIV meds need to be adjusted

## 2013-01-09 NOTE — Patient Instructions (Signed)
-  Your blood pressure is elevated today, you need to start taking a diuretic called Lasix but call your transplant coordinator before taking this medication.   -If you are allowed to take Lasix you need to come by next week for a Lab visit to check your potassium level.   -Please verify with your transplant coordinator if you should take Calcium.   -Decrease your regular insulin at dinner time to 6 units. If your morning readings are above 200, you may increase it back to 8 units.   -Follow up with [place] in two weeks for your blood pressure.

## 2013-01-10 MED ORDER — LAMIVUDINE 150 MG PO TABS: 150.0000 mg | ORAL_TABLET | Freq: Every day | ORAL | Status: DC

## 2013-01-10 MED ORDER — ABACAVIR SULFATE 300 MG PO TABS: 300.0000 mg | ORAL_TABLET | Freq: Two times a day (BID) | ORAL | Status: DC

## 2013-01-10 MED ORDER — INSULIN REGULAR HUMAN 100 UNIT/ML IJ SOLN: INTRAMUSCULAR | Status: DC

## 2013-01-10 MED ORDER — INSULIN NPH (HUMAN) (ISOPHANE) 100 UNIT/ML ~~LOC~~ SUSP: 6.0000 [IU] | Freq: Every day | SUBCUTANEOUS | Status: DC

## 2013-01-10 MED ORDER — TACROLIMUS 1 MG PO CAPS: 9.0000 mg | ORAL_CAPSULE | Freq: Two times a day (BID) | ORAL | Status: DC

## 2013-01-10 MED ORDER — MYCOPHENOLATE SODIUM 180 MG PO TBEC: 540.0000 mg | DELAYED_RELEASE_TABLET | Freq: Two times a day (BID) | ORAL | Status: DC

## 2013-01-10 MED ORDER — PREDNISONE 5 MG PO TABS: 5.0000 mg | ORAL_TABLET | Freq: Every day | ORAL | Status: DC

## 2013-01-10 MED ORDER — CALCIUM CITRATE-VITAMIN D 315-200 MG-UNIT PO TABS: 1.0000 | ORAL_TABLET | Freq: Two times a day (BID) | ORAL | Status: DC

## 2013-01-10 NOTE — Assessment & Plan Note (Signed)
She declines Tdap vaccine. She states she is allergic to the flu vaccine. She had a mammogram last year with normal result, she is due for mammogram and will request that results be faxed to [place]. She remembers having Dexa scan and will request that the records be sent to [place]. Colonoscopy was done in 3/13 in [place], she was asked again to request that records be sent to [place].

## 2013-01-10 NOTE — Assessment & Plan Note (Signed)
Repeat BP persistently elevated to 178/84 today. Norvasc a maximum dose, Coreg could be increased but pt with HR of 61 and likely will benefit from diuretic -Added Lasix 20mg  daily but pt instructed to contact her Transplant coordinator prior to starting this treatment.

## 2013-01-10 NOTE — Assessment & Plan Note (Addendum)
BMET with GFR today shows improved renal function with GFR of 44 (was 7 on 10/07/12). She has f/u appointment at [facility] in March. She continues taking mycophenolate and Prograf as well as prednisone 5mg  daily. She had post-transplant kidney biopsy which showed no signs of rejection.

## 2013-01-10 NOTE — Telephone Encounter (Signed)
March 17

## 2013-01-10 NOTE — Assessment & Plan Note (Signed)
Hgb A1C of 9.3%. She has been on  Prednisone therapy since her transplant. She was seen on 1/5 at [facility] with adjustments in her insulin as follow: NPH to 8units qHs (from 6 units), Humulin to 12 units before breakfast (from 10 units); 10 units before lunch (from 8 units); and 8 units before dinner (from 4 units), plus sliding scale which she has not used. She reports having low blood sugar in the mornings and specifically on 1/21 with CBG of 74 at 3:30AM which woke her up due to diaphoresis. She does not recall skipping a meal that evening.  Lab Results  Component Value Date   HGBA1C 9.3 01/09/2013   HGBA1C  Value: 5.6 (NOTE)                                                                       According to the ADA Clinical Practice Recommendations for 2011, when HbA1c is used as a screening test:   >=6.5%   Diagnostic of Diabetes Mellitus           (if abnormal result  is confirmed)  5.7-6.4%   Increased risk of developing Diabetes Mellitus  References:Diagnosis and Classification of Diabetes Mellitus,Diabetes Care,2011,34(Suppl 1):S62-S69 and Standards of Medical Care in         Diabetes - 2011,Diabetes [id number]  (Suppl 1):S11-S61. 03/21/2011   CREATININE 1.46* 01/09/2013   CREATININE 7.30 DELTA CHECK NOTED* 03/27/2011   CHOL 172 08/26/2012   HDL 54 08/26/2012   TRIG 159* 08/26/2012    Last eye exam and foot exam: No results found for this basename: HMDIABEYEEXA, HMDIABFOOTEX    Assessment: Diabetes control: not controlled Progress toward goals: deteriorated Barriers to meeting goals: adverse effects of medications  Plan: Diabetes treatment: continue current medications She is now on lower dose of prednisone (5mg  daily) which may relfect better glycemic control with her current insulin regimen Refer to: none, she has follow up at [facility] with her Endocrinologist in March Instruction/counseling given: reminded to bring blood glucose meter & log to each visit, reminded to bring medications to each  visit, discussed diet and other instruction/counseling: Given that she has had low CBG in the morning with hypoglycemic event at 3:30AM, we discussed decreasing her dinner time Humilin dose to 6 units (down from 8 units) but she will continue monitorin her CBG and will titrate this dose back to 8 units if her morning CBGs are increased. Her CBG meter could not be dowloaded today but has had readings in the high 300s and even 400s prior to 1/15.

## 2013-01-10 NOTE — Assessment & Plan Note (Deleted)
She declines Tdap vaccine. She states she is allergic to the flu vaccine. She had a mammogram last year with normal result, she is due for mammogram and will request that results be faxed to [place]. She remembers having Dexa scan and will request that the records be sent to [place]. Colonoscopy was done in 3/13 in [place], she was asked again to request that records be sent to [place].

## 2013-01-10 NOTE — Progress Notes (Signed)
  Subjective:    Patient ID: [name], female    DOB: [birth year], 67 y.o.   MRN: [id number]  HPI 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 During her last visit at the [facility] in November she was referred to the [facility] transplant team for follow up of her N/V. She reports that she was seen at [facility] after her visit here and was admitted at [facility] and hospitalized for days for work up of her N/V with no infectious etiology identified and eventual resolution of her symptoms. She has not needed dialysis since then and has had gradual increased urine output daily. She continues to be followed by the [facility] but now only once per month instead of once weekly.    Review of Systems  Constitutional: Negative for fever, chills, diaphoresis, activity change, appetite change, fatigue and unexpected weight change.  HENT: Negative for facial swelling.   Respiratory: Negative for cough, chest tightness and shortness of breath.   Cardiovascular: Negative for chest pain, palpitations and leg swelling.  Gastrointestinal: Negative for nausea, vomiting, abdominal pain, diarrhea and abdominal distention.  Genitourinary: Negative for dysuria.  Musculoskeletal: Negative for myalgias, joint swelling and gait problem.  Skin: Negative for color change and pallor.  Neurological: Negative for dizziness, light-headedness and headaches.  Psychiatric/Behavioral: Negative for behavioral problems and agitation.       Objective:   Physical Exam  Nursing note and vitals reviewed. Constitutional: She is oriented to person, place, and time. She appears well-developed and well-nourished. No distress.  HENT:  Mouth/Throat: No oropharyngeal exudate.  Eyes: Conjunctivae normal are normal. No scleral icterus.  Neck: Neck supple.  Cardiovascular: Normal rate and regular rhythm.    Pulmonary/Chest: Effort normal and breath sounds normal. No respiratory distress. She has no wheezes. She has no rales. She exhibits no tenderness.  Abdominal: Soft. Bowel sounds are normal. She exhibits no distension and no mass. There is no tenderness. There is no rebound and no guarding.       Surgical scar at right lower abdomen healing well.  Musculoskeletal: She exhibits no edema and no tenderness.  Neurological: She is alert and oriented to person, place, and time. Coordination normal.  Skin: Skin is warm and dry. No rash noted. She is not diaphoretic. No erythema.  Psychiatric: She has a normal mood and affect. Her behavior is normal.          Assessment & Plan:

## 2013-01-13 NOTE — Telephone Encounter (Signed)
Actually I see that she is still on HD and the creatinine being better is likely a misleading lab. She shouldn't need readjustment of ARV and her FU appt is appropriately timed

## 2013-01-14 ENCOUNTER — Telehealth: Payer: Self-pay | Admitting: *Deleted

## 2013-01-14 NOTE — Telephone Encounter (Signed)
[facility] co-ordinator for transplants told her not to take the lasix at this time, she is monitor her blood pressure for 1 week and then the transplant team will decide what the best medicine will be for her, this is per pt

## 2013-01-18 NOTE — Telephone Encounter (Signed)
I agree. Thanks! SK

## 2013-01-24 ENCOUNTER — Ambulatory Visit: Payer: Medicare Other | Admitting: Internal Medicine

## 2013-02-03 ENCOUNTER — Other Ambulatory Visit: Payer: Self-pay | Admitting: Infectious Diseases

## 2013-02-03 ENCOUNTER — Other Ambulatory Visit: Payer: Medicare Other

## 2013-02-03 ENCOUNTER — Other Ambulatory Visit (HOSPITAL_COMMUNITY)
Admission: RE | Admit: 2013-02-03 | Discharge: 2013-02-03 | Disposition: A | Discharge status: Home / Self Care | Payer: Medicare Other | Source: Ambulatory Visit | Attending: Infectious Diseases | Admitting: Infectious Diseases

## 2013-02-03 DIAGNOSIS — A539 Syphilis, unspecified: Secondary | ICD-10-CM

## 2013-02-03 DIAGNOSIS — Z113 Encounter for screening for infections with a predominantly sexual mode of transmission: Secondary | ICD-10-CM | POA: Insufficient documentation

## 2013-02-03 LAB — COMPLETE METABOLIC PANEL WITH GFR
AST: 78 U/L — ABNORMAL HIGH (ref 0–37)
Alkaline Phosphatase: 189 U/L — ABNORMAL HIGH (ref 39–117)
BUN: 32 mg/dL — ABNORMAL HIGH (ref 6–23)
Creat: 1.3 mg/dL — ABNORMAL HIGH (ref 0.50–1.10)
GFR, Est Non African American: 43 mL/min — ABNORMAL LOW
Glucose, Bld: 168 mg/dL — ABNORMAL HIGH (ref 70–99)
Potassium: 4.5 mEq/L (ref 3.5–5.3)
Total Bilirubin: 0.6 mg/dL (ref 0.3–1.2)

## 2013-02-03 LAB — LIPID PANEL
Cholesterol: 176 mg/dL (ref 0–200)
HDL: 77 mg/dL (ref >39)
Total CHOL/HDL Ratio: 2.3 Ratio
Triglycerides: 82 mg/dL (ref <150)
VLDL: 16 mg/dL (ref 0–40)

## 2013-02-04 LAB — T-HELPER CELL (CD4) - (RCID CLINIC ONLY): CD4 % Helper T Cell: 12 % — ABNORMAL LOW (ref 33–55)

## 2013-02-04 LAB — HIV-1 RNA QUANT-NO REFLEX-BLD: HIV 1 RNA Quant: <20 copies/mL (ref <20)

## 2013-02-17 ENCOUNTER — Ambulatory Visit (INDEPENDENT_AMBULATORY_CARE_PROVIDER_SITE_OTHER): Payer: Medicare Other | Admitting: Infectious Disease

## 2013-02-17 ENCOUNTER — Encounter: Payer: Self-pay | Admitting: Infectious Disease

## 2013-02-17 ENCOUNTER — Telehealth: Payer: Self-pay | Admitting: Infectious Disease

## 2013-02-17 VITALS — BP 144/76 | HR 66 | Temp 97.4°F | Wt 133.0 lb

## 2013-02-17 DIAGNOSIS — IMO0001 Reserved for inherently not codable concepts without codable children: Secondary | ICD-10-CM

## 2013-02-17 DIAGNOSIS — T8610 Unspecified complication of kidney transplant: Secondary | ICD-10-CM

## 2013-02-17 DIAGNOSIS — B20 Human immunodeficiency virus [HIV] disease: Secondary | ICD-10-CM

## 2013-02-17 DIAGNOSIS — B1920 Unspecified viral hepatitis C without hepatic coma: Secondary | ICD-10-CM

## 2013-02-17 LAB — BASIC METABOLIC PANEL WITH GFR
BUN: 32 mg/dL — ABNORMAL HIGH (ref 6–23)
Chloride: 107 mEq/L (ref 96–112)
GFR, Est Non African American: 37 mL/min — ABNORMAL LOW
Glucose, Bld: 119 mg/dL — ABNORMAL HIGH (ref 70–99)
Potassium: 3.8 mEq/L (ref 3.5–5.3)

## 2013-02-17 MED ORDER — LAMIVUDINE 150 MG PO TABS: 150.0000 mg | ORAL_TABLET | Freq: Every day | ORAL | Status: DC

## 2013-02-17 MED ORDER — ABACAVIR SULFATE-LAMIVUDINE 600-300 MG PO TABS: 1.0000 | ORAL_TABLET | Freq: Every day | ORAL | Status: DC

## 2013-02-17 MED ORDER — ABACAVIR SULFATE 300 MG PO TABS: 600.0000 mg | ORAL_TABLET | Freq: Every day | ORAL | Status: DC

## 2013-02-17 NOTE — Progress Notes (Signed)
Subjective:    Patient ID: [name], female    DOB: [birth year], 67 y.o.   MRN: [id number]  HPI   67year-old woman with past medical history is significant for HIV infection, hypertension, diabetes mellitus type II, sp renal transplant this fall at [facility]. Her renal fxn continues to improve and Dr. Ralene Ok ([facility] ID) had increased her epivir to 150mg . Now her Gfr on most recent labs is at 36 and I plan on rechecking her bmp w gfr today. If continuing to improve I have proposed (and have called in script to her [place] order pharmacy) of Epzicom and sustiva based on her improved renal fxn. I will touch base with Dr. Ralene Ok at [facility] as well. VL is undetectable but CD4 still below 200 and she is on three times weekly bactrim. She remains on  tacrolimus, myfortic and prednisone. I spent greater than 45 minutes with the patient including greater than 50% of time in face to face counsel of the patient and in coordination of their care.   Review of Systems  Constitutional: Negative for fever, chills, diaphoresis, activity change, appetite change, fatigue and unexpected weight change.  HENT: Negative for congestion, sore throat, rhinorrhea, sneezing, trouble swallowing and sinus pressure.   Eyes: Negative for photophobia and visual disturbance.  Respiratory: Negative for cough, chest tightness, shortness of breath, wheezing and stridor.   Cardiovascular: Negative for chest pain, palpitations and leg swelling.  Gastrointestinal: Negative for nausea, vomiting, abdominal pain, diarrhea, constipation, blood in stool, abdominal distention and anal bleeding.  Genitourinary: Negative for dysuria, hematuria, flank pain and difficulty urinating.  Musculoskeletal: Negative for myalgias, back pain, joint swelling and gait problem.  Skin: Negative for color change, pallor, rash and wound.  Neurological: Negative for dizziness, tremors, weakness and light-headedness.  Hematological: Negative for  adenopathy. Does not bruise/bleed easily.  Psychiatric/Behavioral: Negative for behavioral problems, confusion, sleep disturbance, dysphoric mood, decreased concentration and agitation.       Objective:   Physical Exam  Constitutional: She is oriented to person, place, and time. She appears well-developed and well-nourished. No distress.  HENT:  Head: Normocephalic and atraumatic.  Mouth/Throat: Oropharynx is clear and moist. No oropharyngeal exudate.  Eyes: Conjunctivae and EOM are normal. Pupils are equal, round, and reactive to light. No scleral icterus.  Neck: Normal range of motion. Neck supple. No JVD present.  Cardiovascular: Normal rate, regular rhythm and normal heart sounds.  Exam reveals no gallop and no friction rub.   No murmur heard. Pulmonary/Chest: Effort normal and breath sounds normal. No respiratory distress. She has no wheezes. She has no rales. She exhibits no tenderness.  Abdominal: She exhibits no distension and no mass. There is no tenderness. There is no rebound and no guarding.  Musculoskeletal: She exhibits no edema and no tenderness.  Lymphadenopathy:    She has no cervical adenopathy.  Neurological: She is alert and oriented to person, place, and time. Coordination normal.  Skin: Skin is warm and dry. She is not diaphoretic. No erythema. No pallor.  Psychiatric: She has a normal mood and affect. Her behavior is normal. Judgment and thought content normal.          Assessment & Plan:  HIV: continue sustiva and change her to one tablet of Epzicom 600 abacavir with 300mg  lamivudine, provided renal fxn continuing to be w gfr of 50 or better  Renal transplantation: being followed VERY closely at [facility]  Hepatitis C: genotype 1b,: could be treated at [facility] or by [facility]  [place] who work here in our building  DM: being followed by Endocrine, and Dr Hayes Ludwig here in [facility] at [facility].

## 2013-02-17 NOTE — Patient Instructions (Addendum)
We will recheck your kidney fxn  IF your kidney fxn is still improving we can change you to   Epzicom once daily  And  Sustiva  Once daily  I will also however go over this with Dr. Ralene Ok at [facility]

## 2013-02-17 NOTE — Telephone Encounter (Signed)
Pt actually has had GFR go back down again to <50  THerefore will have her stay on ziagen 300MG  TWO TABS DAILY WITH EPIVIR 150MG  RATHER THAN switch to Epzicom as I had wished.  I will call pt and inform her of this

## 2013-02-18 ENCOUNTER — Telehealth: Payer: Self-pay | Admitting: Infectious Disease

## 2013-02-18 NOTE — Telephone Encounter (Signed)
Sharyn Lull, can you fax labs and clinic note from yesterday to  Duane Boston at [facility] fax number [phone]  And  Ruthe Mannan Alspaugh [id number]  Thanks!

## 2013-02-19 ENCOUNTER — Encounter: Payer: Self-pay | Admitting: *Deleted

## 2013-02-20 NOTE — Telephone Encounter (Signed)
Records sent to Dr. Ralene Ok and Dr. Azzie Glatter (per Dr. Thompson Caul office, she was not supposed to follow up with him, but with Dr. Lissa Merlin).

## 2013-06-05 ENCOUNTER — Other Ambulatory Visit: Payer: Medicare Other

## 2013-06-12 ENCOUNTER — Other Ambulatory Visit: Payer: Self-pay

## 2013-06-13 ENCOUNTER — Encounter: Payer: Self-pay | Admitting: Internal Medicine

## 2013-06-19 ENCOUNTER — Ambulatory Visit: Payer: Medicare Other | Admitting: Infectious Disease

## 2013-06-19 ENCOUNTER — Other Ambulatory Visit (INDEPENDENT_AMBULATORY_CARE_PROVIDER_SITE_OTHER): Payer: Medicare Other

## 2013-06-19 DIAGNOSIS — B20 Human immunodeficiency virus [HIV] disease: Secondary | ICD-10-CM

## 2013-06-19 LAB — CBC WITH DIFFERENTIAL/PLATELET
Basophils Absolute: 0 10*3/uL (ref 0.0–0.1)
Basophils Relative: 0 % (ref 0–1)
Eosinophils Absolute: 0.3 10*3/uL (ref 0.0–0.7)
Hemoglobin: 13.9 g/dL (ref 12.0–15.0)
MCH: 31.8 pg (ref 26.0–34.0)
MCHC: 35.3 g/dL (ref 30.0–36.0)
Monocytes Relative: 8 % (ref 3–12)
Neutrophils Relative %: 65 % (ref 43–77)
Platelets: 190 10*3/uL (ref 150–400)
RDW: 15.9 % — ABNORMAL HIGH (ref 11.5–15.5)

## 2013-06-19 LAB — COMPLETE METABOLIC PANEL WITH GFR
AST: 50 U/L — ABNORMAL HIGH (ref 0–37)
BUN: 20 mg/dL (ref 6–23)
CO2: 22 mEq/L (ref 19–32)
Calcium: 9.9 mg/dL (ref 8.4–10.5)
Chloride: 106 mEq/L (ref 96–112)
Creat: 1.19 mg/dL — ABNORMAL HIGH (ref 0.50–1.10)
GFR, Est African American: 56 mL/min — ABNORMAL LOW
Total Bilirubin: 0.5 mg/dL (ref 0.3–1.2)

## 2013-06-19 LAB — RPR: RPR Ser Ql: REACTIVE — AB

## 2013-06-20 LAB — T-HELPER CELL (CD4) - (RCID CLINIC ONLY): CD4 T Cell Abs: 190 uL — ABNORMAL LOW (ref 400–2700)

## 2013-06-20 LAB — HIV-1 RNA QUANT-NO REFLEX-BLD: HIV 1 RNA Quant: <20 copies/mL (ref <20)

## 2013-07-03 ENCOUNTER — Ambulatory Visit: Payer: Medicare Other | Admitting: Infectious Disease

## 2013-07-09 ENCOUNTER — Other Ambulatory Visit: Payer: Self-pay

## 2013-08-05 ENCOUNTER — Encounter: Payer: Self-pay | Admitting: Infectious Disease

## 2013-08-05 ENCOUNTER — Ambulatory Visit (INDEPENDENT_AMBULATORY_CARE_PROVIDER_SITE_OTHER): Payer: Medicare Other | Admitting: Infectious Disease

## 2013-08-05 VITALS — BP 155/80 | HR 77 | Temp 98.2°F | Wt 148.5 lb

## 2013-08-05 DIAGNOSIS — Z940 Kidney transplant status: Secondary | ICD-10-CM

## 2013-08-05 DIAGNOSIS — B1920 Unspecified viral hepatitis C without hepatic coma: Secondary | ICD-10-CM

## 2013-08-05 DIAGNOSIS — B20 Human immunodeficiency virus [HIV] disease: Secondary | ICD-10-CM

## 2013-08-05 DIAGNOSIS — E119 Type 2 diabetes mellitus without complications: Secondary | ICD-10-CM

## 2013-08-05 LAB — CBC WITH DIFFERENTIAL/PLATELET
Basophils Absolute: 0 10*3/uL (ref 0.0–0.1)
Basophils Relative: 0 % (ref 0–1)
Eosinophils Absolute: 0.4 10*3/uL (ref 0.0–0.7)
Eosinophils Relative: 5 % (ref 0–5)
MCH: 31.7 pg (ref 26.0–34.0)
MCHC: 34.2 g/dL (ref 30.0–36.0)
Neutrophils Relative %: 74 % (ref 43–77)
Platelets: 204 10*3/uL (ref 150–400)
RBC: 4.19 MIL/uL (ref 3.87–5.11)
RDW: 14.7 % (ref 11.5–15.5)

## 2013-08-05 LAB — COMPLETE METABOLIC PANEL WITH GFR
ALT: 49 U/L — ABNORMAL HIGH (ref 0–35)
Albumin: 3.9 g/dL (ref 3.5–5.2)
CO2: 24 mEq/L (ref 19–32)
Calcium: 9.7 mg/dL (ref 8.4–10.5)
Chloride: 105 mEq/L (ref 96–112)
Creat: 1.34 mg/dL — ABNORMAL HIGH (ref 0.50–1.10)
GFR, Est African American: 48 mL/min — ABNORMAL LOW
Potassium: 4.3 mEq/L (ref 3.5–5.3)
Total Protein: 7.1 g/dL (ref 6.0–8.3)

## 2013-08-05 MED ORDER — ABACAVIR SULFATE-LAMIVUDINE 600-300 MG PO TABS: 1.0000 | ORAL_TABLET | Freq: Every day | ORAL | Status: DC

## 2013-08-05 NOTE — Progress Notes (Signed)
  Subjective:    Patient ID: [name], female    DOB: 1946/07/28, 67 y.o.   MRN: [id number]  HPI   67year-old woman with past medical history is significant for HIV infection, hypertension, diabetes mellitus type II, sp renal transplant one year ago at [facility]. She is currently with perfect suppression on Sustiva and Epzicom    Review of Systems  Constitutional: Negative for fever, chills, diaphoresis, activity change, appetite change, fatigue and unexpected weight change.  HENT: Negative for congestion, sore throat, rhinorrhea, sneezing, trouble swallowing and sinus pressure.   Eyes: Negative for photophobia and visual disturbance.  Respiratory: Negative for cough, chest tightness, shortness of breath, wheezing and stridor.   Cardiovascular: Negative for chest pain, palpitations and leg swelling.  Gastrointestinal: Negative for nausea, vomiting, abdominal pain, diarrhea, constipation, blood in stool, abdominal distention and anal bleeding.  Genitourinary: Negative for dysuria, hematuria, flank pain and difficulty urinating.  Musculoskeletal: Negative for myalgias, back pain, joint swelling and gait problem.  Skin: Negative for color change, pallor, rash and wound.  Neurological: Negative for dizziness, tremors, weakness and light-headedness.  Hematological: Negative for adenopathy. Does not bruise/bleed easily.  Psychiatric/Behavioral: Negative for behavioral problems, confusion, sleep disturbance, dysphoric mood, decreased concentration and agitation.       Objective:   Physical Exam  Constitutional: She is oriented to person, place, and time. She appears well-developed and well-nourished. No distress.  HENT:  Head: Normocephalic and atraumatic.  Mouth/Throat: Oropharynx is clear and moist. No oropharyngeal exudate.  Eyes: Conjunctivae and EOM are normal. Pupils are equal, round, and reactive to light. No scleral icterus.  Neck: Normal range of motion. Neck supple. No JVD  present.  Cardiovascular: Normal rate, regular rhythm and normal heart sounds.  Exam reveals no gallop and no friction rub.   No murmur heard. Pulmonary/Chest: Effort normal and breath sounds normal. No respiratory distress. She has no wheezes. She has no rales. She exhibits no tenderness.  Abdominal: She exhibits no distension and no mass. There is no tenderness. There is no rebound and no guarding.  Musculoskeletal: She exhibits no edema and no tenderness.  Lymphadenopathy:    She has no cervical adenopathy.  Neurological: She is alert and oriented to person, place, and time. Coordination normal.  Skin: Skin is warm and dry. She is not diaphoretic. No erythema. No pallor.  Psychiatric: She has a normal mood and affect. Her behavior is normal. Judgment and thought content normal.          Assessment & Plan:  HIV: will try to switch her to Triumeq for dosing simplicity and one pill once a day also with higher barrier to R  Renal transplantation: being followed VERY closely at [facility], and renal fxn is much better  Hepatitis C: genotype 1b,: could be treated at [facility] or by [place] who work here in our building  DM: being followed by Endocrine, and Dr [name] here in [facility] at [facility].

## 2013-08-06 ENCOUNTER — Telehealth: Payer: Self-pay | Admitting: Infectious Disease

## 2013-08-06 NOTE — Telephone Encounter (Signed)
[name]'s GFR is down a little bit. Was she able to pick up the coformulated TRIumeq or is she still on tivicay and Epzicom

## 2013-08-06 NOTE — Telephone Encounter (Signed)
She is not picking up the new medication until Monday of next week.

## 2013-08-07 LAB — T-HELPER CELL (CD4) - (RCID CLINIC ONLY): CD4 T Cell Abs: 200 /uL — ABNORMAL LOW (ref 400–2700)

## 2013-08-07 LAB — HIV-1 RNA ULTRAQUANT REFLEX TO GENTYP+: HIV-1 RNA Quant, Log: <1.3 {Log} (ref <1.30)

## 2013-08-11 ENCOUNTER — Encounter: Payer: Self-pay | Admitting: Internal Medicine

## 2013-08-11 ENCOUNTER — Ambulatory Visit (INDEPENDENT_AMBULATORY_CARE_PROVIDER_SITE_OTHER): Payer: Medicare Other | Admitting: Internal Medicine

## 2013-08-11 VITALS — BP 157/75 | HR 67 | Temp 97.6°F | Ht 61.5 in | Wt 147.7 lb

## 2013-08-11 DIAGNOSIS — E119 Type 2 diabetes mellitus without complications: Secondary | ICD-10-CM

## 2013-08-11 DIAGNOSIS — I10 Essential (primary) hypertension: Secondary | ICD-10-CM

## 2013-08-11 DIAGNOSIS — K5900 Constipation, unspecified: Secondary | ICD-10-CM

## 2013-08-11 DIAGNOSIS — Z0000 Encounter for general adult medical examination without abnormal findings: Secondary | ICD-10-CM

## 2013-08-11 DIAGNOSIS — Z940 Kidney transplant status: Secondary | ICD-10-CM

## 2013-08-11 MED ORDER — MYCOPHENOLATE SODIUM 180 MG PO TBEC: 540.0000 mg | DELAYED_RELEASE_TABLET | Freq: Two times a day (BID) | ORAL | Status: DC

## 2013-08-11 MED ORDER — MYCOPHENOLATE SODIUM 180 MG PO TBEC: 360.0000 mg | DELAYED_RELEASE_TABLET | Freq: Three times a day (TID) | ORAL | Status: DC

## 2013-08-11 MED ORDER — INSULIN NPH (HUMAN) (ISOPHANE) 100 UNIT/ML ~~LOC~~ SUSP: 12.0000 [IU] | Freq: Every day | SUBCUTANEOUS | Status: DC

## 2013-08-11 MED ORDER — SENNOSIDES-DOCUSATE SODIUM 8.6-50 MG PO TABS: 2.0000 | ORAL_TABLET | Freq: Two times a day (BID) | ORAL | Status: DC | PRN

## 2013-08-11 MED ORDER — INSULIN REGULAR HUMAN 100 UNIT/ML IJ SOLN: INTRAMUSCULAR | Status: DC

## 2013-08-11 NOTE — Patient Instructions (Addendum)
General Instructions: -Continue taking good care of your feet wearing comfortable shoes.  -Exercise at least 3 times per week to build up your strength.  -Start checking your blood sugar at least 3 times per day.  -Follow up with [place] in 3 months.    Treatment Goals:  Goals (1 Years of Data) as of 08/11/13         As of Today 08/05/13 02/17/13 02/03/13 01/09/13     Blood Pressure    . Blood Pressure < 140/90  157/75 155/80 144/76  178/84     Result Component    . HEMOGLOBIN A1C < 7.0  6.4    9.3    . LDL CALC < 100     83       Progress Toward Treatment Goals:  Treatment Goal 08/11/2013  Hemoglobin A1C at goal  Blood pressure at goal    Self Care Goals & Plans:  Self Care Goal 08/11/2013  Manage my medications take my medicines as prescribed; refill my medications on time; bring my medications to every visit  Monitor my health check my feet daily; keep track of my blood glucose; keep track of my blood pressure  Eat healthy foods eat foods that are low in salt; eat baked foods instead of fried foods  Be physically active find an activity I enjoy; join a walking program    Home Blood Glucose Monitoring 08/11/2013  Check my blood sugar 2 times a day  When to check my blood sugar before breakfast; at bedtime     Care Management & Community Referrals:  Referral 08/11/2013  Referrals made for care management support none needed  Referrals made to community resources none

## 2013-08-12 NOTE — Assessment & Plan Note (Addendum)
BP Readings from Last 3 Encounters:  08/11/13 157/75  08/05/13 155/80  02/17/13 144/76    Lab Results  Component Value Date   NA 137 08/05/2013   K 4.3 08/05/2013   CREATININE 1.34* 08/05/2013    Assessment: Blood pressure control: controlled Progress toward BP goal:  at goal Comments: She is on Coreg 6.25mg  BID,  Norvasc 10mg  daily, hydralazine 25mg  BID.   Plan: Medications:  continue current medications. Followed by [facility]  Educational resources provided: brochure Self management tools provided: home blood pressure logbook Other plans: Pt to follow up with [facility] in December.

## 2013-08-12 NOTE — Assessment & Plan Note (Signed)
She continues to take her immunosuppressants, including prednisone 5mg  daily. She has follow up appointment with the Transplant Team at [facility] on 9/22.

## 2013-08-12 NOTE — Progress Notes (Signed)
  Subjective:    Patient ID: [name], female    DOB: 1946/04/22, 67 y.o.   MRN: [id number]  HPI [name] is a pleasant 67 year old woman with PMH of HIV, hepatitis C, DM2, and hx of ESRD s/p kidney transplant who presents for follow up visit. She continues to be followed by [facility] transplant team, as well as Endocrinology. Her HIV is well controlled, she has seen Dr. Tommy Medal recently with recommendation for her to start Triumeq daily but unfortunately this medication is too costly so she has not changed her HIV medication regimen.  She complains of feeling tired lately but attributes this to a sedentary lifestyle.  She explains that she felt better last month after going for walks almost every day.       Review of Systems  Constitutional: Positive for fatigue. Negative for fever, chills, activity change and appetite change.  Respiratory: Negative for cough and shortness of breath.   Cardiovascular: Negative for chest pain and palpitations.  Gastrointestinal: Negative for abdominal pain.  Genitourinary: Negative for dysuria.  Musculoskeletal: Negative for myalgias.  Neurological: Negative for dizziness and light-headedness.  Psychiatric/Behavioral: Negative for behavioral problems, sleep disturbance and agitation.       Objective:   Physical Exam  Nursing note and vitals reviewed. Constitutional: She is oriented to person, place, and time. She appears well-developed and well-nourished. No distress.  Eyes: Conjunctivae are normal. No scleral icterus.  Cardiovascular: Normal rate and regular rhythm.   Pulmonary/Chest: Effort normal and breath sounds normal.  Abdominal: Soft.  Musculoskeletal: She exhibits no edema and no tenderness.  Strength 5/5 in LE bilaterally   Neurological: She is alert and oriented to person, place, and time. Coordination normal.  Skin: Skin is warm and dry. She is not diaphoretic.  Psychiatric: She has a normal mood and affect. Her behavior is normal.   Mood is "good", affect is congruent.           Assessment & Plan:

## 2013-08-12 NOTE — Assessment & Plan Note (Addendum)
Lab Results  Component Value Date   HGBA1C 6.4 08/11/2013   HGBA1C 9.3 01/09/2013   HGBA1C 5.0 08/26/2012     Assessment: Diabetes control: good control (HgbA1C at goal) Progress toward A1C goal:  at goal Comments: She is on Novolin N 12 units in the evening, and Novolin R 12 units with breakfast, 10 units with lunch, 8 units with dinner and sliding scale. She denies hypoglycemia with this regimen.   Plan: Medications:  continue current medications. She has follow up with [facility] in December.  Home glucose monitoring: Frequency: 2 times a day Timing: before breakfast;at bedtime Instruction/counseling given: reminded to bring blood glucose meter & log to each visit, reminded to bring medications to each visit and discussed foot care Educational resources provided: brochure Self management tools provided: home glucose logbook Other plans: Pt instructed to check CBG at least 3 times daily. She will start walking more often. Diabetic foot exam performed during this visit, pt instructed to wear shoes that are not tight fitting and to maintain her toe nails trimmed. She just had her eye exam with no macular/retinal degeneration.

## 2013-08-12 NOTE — Assessment & Plan Note (Deleted)
Lab Results  Component Value Date   HGBA1C 6.4 08/11/2013   HGBA1C 9.3 01/09/2013   HGBA1C 5.0 08/26/2012     Assessment: Diabetes control: good control (HgbA1C at goal) Progress toward A1C goal:  at goal Comments: She is on Novolin N 12 units in the evening, and Novolin R 12 units with breakfast, 10 units with lunch, 8 units with dinner and sliding scale. She denies hypoglycemia with this regimen.   Plan: Medications:  continue current medications. She has follow up with [facility] in December.  Home glucose monitoring: Frequency: 2 times a day Timing: before breakfast;at bedtime Instruction/counseling given: reminded to bring blood glucose meter & log to each visit, reminded to bring medications to each visit and discussed foot care Educational resources provided: brochure Self management tools provided: home glucose logbook Other plans: Pt instructed to check CBG at least 3 times daily. Diabetic foot exam performed during this visit. She just had her eye exam with no macular/retinal degeneration.

## 2013-08-13 NOTE — Progress Notes (Signed)
Case discussed with Dr. Hayes Ludwig soon after the resident saw the patient.  We reviewed the resident's history and exam and pertinent patient test results.  I agree with the assessment, diagnosis, and plan of care documented in the resident's note.  BP somewhat elevated today.  Her BP medications are monitored and modified by her transplant nephrology team.  Will defer to that team regarding further changes to regimen.

## 2013-09-04 ENCOUNTER — Other Ambulatory Visit: Payer: Medicare Other

## 2013-09-04 DIAGNOSIS — Z940 Kidney transplant status: Secondary | ICD-10-CM

## 2013-09-04 DIAGNOSIS — B1920 Unspecified viral hepatitis C without hepatic coma: Secondary | ICD-10-CM

## 2013-09-04 DIAGNOSIS — B20 Human immunodeficiency virus [HIV] disease: Secondary | ICD-10-CM

## 2013-09-04 DIAGNOSIS — E119 Type 2 diabetes mellitus without complications: Secondary | ICD-10-CM

## 2013-09-04 LAB — CBC WITH DIFFERENTIAL/PLATELET
Basophils Absolute: 0 10*3/uL (ref 0.0–0.1)
Basophils Relative: 0 % (ref 0–1)
Eosinophils Relative: 4 % (ref 0–5)
HCT: 38.9 % (ref 36.0–46.0)
MCHC: 35 g/dL (ref 30.0–36.0)
Monocytes Absolute: 0.6 10*3/uL (ref 0.1–1.0)
Neutro Abs: 4.1 10*3/uL (ref 1.7–7.7)
Platelets: 189 10*3/uL (ref 150–400)
RDW: 14.6 % (ref 11.5–15.5)
WBC: 6.4 10*3/uL (ref 4.0–10.5)

## 2013-09-04 LAB — COMPLETE METABOLIC PANEL WITH GFR
ALT: 35 U/L (ref 0–35)
AST: 45 U/L — ABNORMAL HIGH (ref 0–37)
Alkaline Phosphatase: 127 U/L — ABNORMAL HIGH (ref 39–117)
BUN: 29 mg/dL — ABNORMAL HIGH (ref 6–23)
Creat: 1.38 mg/dL — ABNORMAL HIGH (ref 0.50–1.10)
Potassium: 4.6 mEq/L (ref 3.5–5.3)

## 2013-09-05 LAB — T-HELPER CELL (CD4) - (RCID CLINIC ONLY)
CD4 % Helper T Cell: 15 % — ABNORMAL LOW (ref 33–55)
CD4 T Cell Abs: 210 /uL — ABNORMAL LOW (ref 400–2700)

## 2013-09-05 LAB — HIV-1 RNA QUANT-NO REFLEX-BLD
HIV 1 RNA Quant: <20 copies/mL (ref <20)
HIV-1 RNA Quant, Log: <1.3 {Log} (ref <1.30)

## 2013-09-22 ENCOUNTER — Encounter: Payer: Self-pay | Admitting: Infectious Disease

## 2013-09-22 ENCOUNTER — Ambulatory Visit (INDEPENDENT_AMBULATORY_CARE_PROVIDER_SITE_OTHER): Payer: Medicare Other | Admitting: Infectious Disease

## 2013-09-22 VITALS — BP 181/84 | HR 65 | Temp 97.6°F | Wt 151.0 lb

## 2013-09-22 DIAGNOSIS — B20 Human immunodeficiency virus [HIV] disease: Secondary | ICD-10-CM

## 2013-09-22 DIAGNOSIS — Z940 Kidney transplant status: Secondary | ICD-10-CM

## 2013-09-22 DIAGNOSIS — N184 Chronic kidney disease, stage 4 (severe): Secondary | ICD-10-CM

## 2013-09-22 DIAGNOSIS — E1059 Type 1 diabetes mellitus with other circulatory complications: Secondary | ICD-10-CM

## 2013-09-22 DIAGNOSIS — B1920 Unspecified viral hepatitis C without hepatic coma: Secondary | ICD-10-CM

## 2013-09-22 MED ORDER — ABACAVIR-DOLUTEGRAVIR-LAMIVUD 600-50-300 MG PO TABS: 1.0000 | ORAL_TABLET | Freq: Every day | ORAL | Status: DC

## 2013-09-22 NOTE — Progress Notes (Signed)
  Subjective:    Patient ID: [name], female    DOB: 1946/05/03, 67 y.o.   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 She has been currently with perfect suppression on Sustiva and Epzicom. I attempted to changed her to [facility] but she claims that medicare/medicaid would not pay for it which I find suprising since it has been FDA approved.     Review of Systems  Constitutional: Negative for fever, chills, diaphoresis, activity change, appetite change, fatigue and unexpected weight change.  HENT: Negative for congestion, rhinorrhea, sinus pressure, sneezing, sore throat and trouble swallowing.   Eyes: Negative for photophobia and visual disturbance.  Respiratory: Negative for cough, chest tightness, shortness of breath, wheezing and stridor.   Cardiovascular: Negative for chest pain, palpitations and leg swelling.  Gastrointestinal: Negative for nausea, vomiting, abdominal pain, diarrhea, constipation, blood in stool, abdominal distention and anal bleeding.  Genitourinary: Negative for dysuria, hematuria, flank pain and difficulty urinating.  Musculoskeletal: Negative for back pain, gait problem, joint swelling and myalgias.  Skin: Negative for color change, pallor, rash and wound.  Neurological: Negative for dizziness, tremors, weakness and light-headedness.  Hematological: Negative for adenopathy. Does not bruise/bleed easily.  Psychiatric/Behavioral: Negative for behavioral problems, confusion, sleep disturbance, dysphoric mood, decreased concentration and agitation.       Objective:   Physical Exam  Constitutional: She is oriented to person, place, and time. She appears well-developed and well-nourished. No distress.  HENT:  Head: Normocephalic and atraumatic.  Mouth/Throat: Oropharynx is clear and moist. No oropharyngeal exudate.   Eyes: Conjunctivae and EOM are normal. Pupils are equal, round, and reactive to light. No scleral icterus.  Neck: Normal range of motion. Neck supple. No JVD present.  Cardiovascular: Normal rate, regular rhythm and normal heart sounds.  Exam reveals no gallop and no friction rub.   No murmur heard. Pulmonary/Chest: Effort normal and breath sounds normal. No respiratory distress. She has no wheezes. She has no rales. She exhibits no tenderness.  Abdominal: She exhibits no distension and no mass. There is no tenderness. There is no rebound and no guarding.  Musculoskeletal: She exhibits no edema and no tenderness.  Lymphadenopathy:    She has no cervical adenopathy.  Neurological: She is alert and oriented to person, place, and time. Coordination normal.  Skin: Skin is warm and dry. She is not diaphoretic. No erythema. No pallor.  Psychiatric: She has a normal mood and affect. Her behavior is normal. Judgment and thought content normal.          Assessment & Plan:  HIV: Send E script for  Triumeq again for  dosing simplicity and one pill once a day also with higher barrier to R. Her GFR here was just under 50 but I am not terribly concerned about 3tc dose adjustment  Renal transplantation: being followed VERY closely at [facility], and renal fxn is much better, will also cc notes to Dr. Jimmy Footman  Hepatitis C: genotype 1b,: likely to  be treated at [facility]   DM: being followed by Endocrine, and Dr Hayes Ludwig here in [facility].  HCM: pt YET again adamantly refused flu vaccine claiming it made her severely ill in [place] (see allergies)

## 2013-10-09 ENCOUNTER — Other Ambulatory Visit: Payer: Self-pay

## 2013-11-10 ENCOUNTER — Ambulatory Visit (INDEPENDENT_AMBULATORY_CARE_PROVIDER_SITE_OTHER): Payer: Medicare Other | Admitting: Internal Medicine

## 2013-11-10 ENCOUNTER — Encounter: Payer: Self-pay | Admitting: Internal Medicine

## 2013-11-10 VITALS — BP 158/85 | HR 63 | Temp 98.2°F | Ht 63.0 in | Wt 154.3 lb

## 2013-11-10 DIAGNOSIS — E119 Type 2 diabetes mellitus without complications: Secondary | ICD-10-CM

## 2013-11-10 DIAGNOSIS — I10 Essential (primary) hypertension: Secondary | ICD-10-CM

## 2013-11-10 DIAGNOSIS — Z23 Encounter for immunization: Secondary | ICD-10-CM

## 2013-11-10 DIAGNOSIS — R5381 Other malaise: Secondary | ICD-10-CM

## 2013-11-10 DIAGNOSIS — Z0000 Encounter for general adult medical examination without abnormal findings: Secondary | ICD-10-CM

## 2013-11-10 DIAGNOSIS — K219 Gastro-esophageal reflux disease without esophagitis: Secondary | ICD-10-CM

## 2013-11-10 LAB — POCT GLYCOSYLATED HEMOGLOBIN (HGB A1C): Hemoglobin A1C: 6.3

## 2013-11-10 LAB — GLUCOSE, CAPILLARY: Glucose-Capillary: 162 mg/dL — ABNORMAL HIGH (ref 70–99)

## 2013-11-10 MED ORDER — HYDRALAZINE HCL 25 MG PO TABS: 25.0000 mg | ORAL_TABLET | Freq: Three times a day (TID) | ORAL | Status: DC

## 2013-11-10 MED ORDER — OMEPRAZOLE 20 MG PO CPDR: 20.0000 mg | DELAYED_RELEASE_CAPSULE | Freq: Every day | ORAL | Status: DC

## 2013-11-10 NOTE — Patient Instructions (Signed)
General Instructions: -Your Hemoglobin A1C is 6.3%. Good job! -You have been referred to outpatient physical therapy for your physical deconditioning. You will be contacted with an appointment with them.  -Start taking hydralazine three times per day for your blood pressure.  -You may follow up with [place] on with Nephrology for a blood pressure recheck between 2-4 weeks.  -Follow up with [place] in 3 months for diabetes check.  -Have a Wonderful Christmas!   Treatment Goals:  Goals (1 Years of Data) as of 11/10/13         As of Today As of Today 09/22/13 08/11/13 08/05/13     Blood Pressure    . Blood Pressure < 140/90  158/85 172/85 181/84 157/75 155/80     Result Component    . HEMOGLOBIN A1C < 7.0  6.3   6.4     . LDL CALC < 100            Progress Toward Treatment Goals:  Treatment Goal 11/10/2013  Hemoglobin A1C at goal  Blood pressure deteriorated    Self Care Goals & Plans:  Self Care Goal 11/10/2013  Manage my medications take my medicines as prescribed; refill my medications on time  Monitor my health check my feet daily  Eat healthy foods eat foods that are low in salt; eat baked foods instead of fried foods; drink diet soda or water instead of juice or soda  Be physically active find an activity I enjoy    Home Blood Glucose Monitoring 11/10/2013  Check my blood sugar 3 times a day  When to check my blood sugar before breakfast; before dinner; at bedtime     Care Management & Community Referrals:  Referral 11/10/2013  Referrals made for care management support none needed  Referrals made to community resources exercise/physical therapy

## 2013-11-11 DIAGNOSIS — R5381 Other malaise: Secondary | ICD-10-CM | POA: Insufficient documentation

## 2013-11-11 NOTE — Assessment & Plan Note (Signed)
She states that recently she has become more active in the community and has noticed that she has been physically deconditioned with difficulty getting up from chair. She notes that she has been weak for a while though, perhaps ever since her transplant.  On physical exam, get up and go with noticeable difficulty to get up from chair without help using UE. Strength 4/5 in LE bilaterally with no Neurological deficits. No falls.  Referral to outpatient physical therapy.

## 2013-11-11 NOTE — Assessment & Plan Note (Signed)
Lab Results  Component Value Date   HGBA1C 6.3 11/10/2013   HGBA1C 6.4 08/11/2013   HGBA1C 9.3 01/09/2013     Assessment: Diabetes control: good control (HgbA1C at goal) Progress toward A1C goal:  at goal Comments: Her nighttime insulin has recently been increased to 12 units. She continues to see Endocrinology at [facility].   Plan: Medications:  continue current medications Home glucose monitoring: Frequency: 3 times a day Timing: before breakfast;before dinner;at bedtime Instruction/counseling given: reminded to bring blood glucose meter & log to each visit, discussed foot care and discussed the need for weight loss Educational resources provided: brochure Self management tools provided: home glucose logbook Other plans: She will start exercises as part of her Physical therapy. No hypoglycemia symptoms, no CBG<80 per her reports (her CBG meter could not be downloaded during this visit).

## 2013-11-11 NOTE — Assessment & Plan Note (Signed)
BP Readings from Last 3 Encounters:  11/10/13 158/85  09/22/13 181/84  08/11/13 157/75    Lab Results  Component Value Date   NA 137 09/04/2013   K 4.6 09/04/2013   CREATININE 1.38* 09/04/2013    Assessment: Blood pressure control: moderately elevated Progress toward BP goal:  deteriorated Comments: She is on hydralazine 25mg  BID, Coreg 6.25mg  BID, Norvasc 10mg  daily. Per her report, her Nephrologist at [facility] has mentioned increased hydralazine to TID.   Plan: Medications:  continue current medications and increased hydralazine frequency Educational resources provided: brochure Self management tools provided: home blood pressure logbook Other plans: Increase hydralazine to TID. She is requested to follow up in 2-4weeks (she has a busy schedule for the Holidays)

## 2013-11-11 NOTE — Assessment & Plan Note (Signed)
Reports last mammogram done at 19 for women here in [place] this year. DEXA scan also done this year with no osteoporosis per her report. Appreciate Kaye's help in obtaining these records.  Colonoscopy from [facility] still not faxed to [place]. Will obtain consent and request them again.  She refuses flu vaccination again.  Pneumovax vaccine given during this visit.  She thinks she may have received the Tdap vaccine prior to her kidney transplant, she will let us know.

## 2013-11-11 NOTE — Progress Notes (Signed)
   Subjective:    Patient ID: [name], female    DOB: Oct 14, 1946, 67 y.o.   MRN: [id number]  Diabetes Pertinent negatives for hypoglycemia include no confusion, dizziness, headaches, pallor or tremors. Pertinent negatives for diabetes include no chest pain, no fatigue, no polyuria and no weakness.   [name] is a pleasant 67 year old woman with PMH significant for HIV, CKD s/p renal transplant, DM2, who presents for follow up visit. She continues to be followed by the [facility] transplant team and Endocrinology but is slowly reestablishing care with [facility], with Dr. Jimmy Footman as her Nephrologist. Her HIV is well controlled and she is compliant with her antiretroviral therapy.  She complains of feeling not as strong as in the past. She has been busy the past two weeks involved with her [employer] and other commitments and notes that she has felt tired with occasional anterior shin soreness.    Review of Systems  Constitutional: Negative for fever, chills, diaphoresis, activity change, appetite change, fatigue and unexpected weight change.  Respiratory: Negative for cough, chest tightness, shortness of breath and wheezing.   Cardiovascular: Negative for chest pain, palpitations and leg swelling.  Gastrointestinal: Negative for nausea, vomiting, abdominal pain, diarrhea and constipation.  Endocrine: Negative for polyuria.  Genitourinary: Negative for dysuria and difficulty urinating.  Musculoskeletal: Positive for myalgias. Negative for back pain.  Skin: Negative for color change, pallor, rash and wound.  Neurological: Negative for dizziness, tremors, syncope, weakness, light-headedness, numbness and headaches.  Hematological: Negative for adenopathy. Does not bruise/bleed easily.  Psychiatric/Behavioral: Negative for behavioral problems, confusion and agitation.       Objective:   Physical Exam  Nursing note and vitals reviewed. Constitutional: She is oriented to person,  place, and time. She appears well-developed and well-nourished. No distress.  HENT:  Head: Normocephalic.  Eyes: Conjunctivae are normal. No scleral icterus.  Cardiovascular: Normal rate and regular rhythm.   Pulmonary/Chest: Effort normal and breath sounds normal. No respiratory distress. She has no wheezes. She has no rales.  Abdominal: Soft. There is no tenderness.  Musculoskeletal: She exhibits no edema.  Mild tenderness to palpation of anterior shins bilaterally with no bruises.  Get up and go test: she had difficulty standing up from sitting without using her upper extremities.  Normal muscle bulk.   Neurological: She is alert and oriented to person, place, and time. She exhibits normal muscle tone. Coordination normal.  Strength 5/5 in upper extremity but 4/5 in Lower extremity bilaterally and symmetrically.   Skin: Skin is warm and dry. No rash noted. She is not diaphoretic. No erythema. No pallor.  Psychiatric: She has a normal mood and affect. Her behavior is normal.          Assessment & Plan:

## 2013-11-12 NOTE — Progress Notes (Signed)
Case discussed with Dr. Kennerly at time of visit.  We reviewed the resident's history and exam and pertinent patient test results.  I agree with the assessment, diagnosis, and plan of care documented in the resident's note. 

## 2013-11-24 ENCOUNTER — Ambulatory Visit: Payer: Medicare Other | Admitting: Internal Medicine

## 2013-12-01 ENCOUNTER — Ambulatory Visit: Payer: Medicare Other | Admitting: Internal Medicine

## 2013-12-08 ENCOUNTER — Telehealth: Payer: Self-pay | Admitting: *Deleted

## 2013-12-08 NOTE — Telephone Encounter (Signed)
CALL PT OFFICE TO FOLLOW UP ON REFERRAL DATED 12-8-014.  OFFICE TO FOLLOW UP AND CONTACT PATIENT TO SET UP APPOINTMENT.   [name] NTII 1-5-015   2:53PM

## 2013-12-11 ENCOUNTER — Ambulatory Visit (INDEPENDENT_AMBULATORY_CARE_PROVIDER_SITE_OTHER): Payer: Medicare Other | Admitting: Internal Medicine

## 2013-12-11 ENCOUNTER — Encounter: Payer: Self-pay | Admitting: Internal Medicine

## 2013-12-11 VITALS — BP 134/69 | HR 59 | Temp 97.0°F | Ht 63.0 in | Wt 146.6 lb

## 2013-12-11 DIAGNOSIS — R6889 Other general symptoms and signs: Secondary | ICD-10-CM

## 2013-12-11 DIAGNOSIS — K5090 Crohn's disease, unspecified, without complications: Secondary | ICD-10-CM

## 2013-12-11 DIAGNOSIS — R197 Diarrhea, unspecified: Secondary | ICD-10-CM

## 2013-12-11 DIAGNOSIS — Z8639 Personal history of other endocrine, nutritional and metabolic disease: Secondary | ICD-10-CM

## 2013-12-11 DIAGNOSIS — R112 Nausea with vomiting, unspecified: Secondary | ICD-10-CM

## 2013-12-11 DIAGNOSIS — Z862 Personal history of diseases of the blood and blood-forming organs and certain disorders involving the immune mechanism: Secondary | ICD-10-CM

## 2013-12-11 DIAGNOSIS — E119 Type 2 diabetes mellitus without complications: Secondary | ICD-10-CM

## 2013-12-11 DIAGNOSIS — Z21 Asymptomatic human immunodeficiency virus [HIV] infection status: Secondary | ICD-10-CM

## 2013-12-11 DIAGNOSIS — D649 Anemia, unspecified: Secondary | ICD-10-CM

## 2013-12-11 LAB — CBC WITH DIFFERENTIAL/PLATELET
Basophils Absolute: 0 10*3/uL (ref 0.0–0.1)
Basophils Relative: 0 % (ref 0–1)
EOS PCT: 9 % — AB (ref 0–5)
Eosinophils Absolute: 0.7 10*3/uL (ref 0.0–0.7)
HEMATOCRIT: 42 % (ref 36.0–46.0)
Hemoglobin: 14.2 g/dL (ref 12.0–15.0)
LYMPHS PCT: 17 % (ref 12–46)
Lymphs Abs: 1.3 10*3/uL (ref 0.7–4.0)
MCH: 30.3 pg (ref 26.0–34.0)
MCHC: 33.8 g/dL (ref 30.0–36.0)
MCV: 89.6 fL (ref 78.0–100.0)
MONO ABS: 0.5 10*3/uL (ref 0.1–1.0)
Monocytes Relative: 6 % (ref 3–12)
Neutro Abs: 5.1 10*3/uL (ref 1.7–7.7)
Neutrophils Relative %: 68 % (ref 43–77)
Platelets: 220 10*3/uL (ref 150–400)
RBC: 4.69 MIL/uL (ref 3.87–5.11)
RDW: 14.7 % (ref 11.5–15.5)
WBC: 7.7 10*3/uL (ref 4.0–10.5)

## 2013-12-11 LAB — BASIC METABOLIC PANEL WITH GFR
BUN: 21 mg/dL (ref 6–23)
CO2: 26 meq/L (ref 19–32)
CREATININE: 1.23 mg/dL — AB (ref 0.50–1.10)
Calcium: 9.7 mg/dL (ref 8.4–10.5)
Chloride: 108 mEq/L (ref 96–112)
GFR, Est African American: 53 mL/min — ABNORMAL LOW
GFR, Est Non African American: 46 mL/min — ABNORMAL LOW
GLUCOSE: 192 mg/dL — AB (ref 70–99)
Potassium: 3.4 mEq/L — ABNORMAL LOW (ref 3.5–5.3)
Sodium: 142 mEq/L (ref 135–145)

## 2013-12-11 LAB — GLUCOSE, CAPILLARY: Glucose-Capillary: 233 mg/dL — ABNORMAL HIGH (ref 70–99)

## 2013-12-11 NOTE — Patient Instructions (Signed)
1. Please return to the stool kit to the clinic as soon as possible so it can be tested.  You will have labs drawn today.  You will be contacted with any abnormal results and if you need further treatment.     2. Please take all medications as prescribed.    3. If you have worsening of your symptoms or new symptoms arise, please call the clinic ([phone]), or go to the ER immediately if symptoms are severe.

## 2013-12-11 NOTE — Progress Notes (Signed)
   Subjective:    Patient ID: [name], female    DOB: [birth year], 68 y.o.   MRN: [id number]  HPI Comments: [name] is a 68 year old woman with a PMH of HIV (VL < 20, CD4 210), HTN, DM type 2, HCV, and CKD s/p renal transplant in 2013.  She presents with a three week history of diarrhea.  It was initially very watery in the first few days and then resolved.  Then it returned a few days later and is now loose.  She has not noticed blood.  She says the episodes are more frequent at night, occuring about every two hours.  The diarrhea does not seem to be associated with certain foods.  Associated symptoms include nausea and 2 episodes of non-bloody vomiting.  At times she feels she has a "sour stomach" and as if her food is not digested.  She has been belching more.  She does not have abdominal pain.  She is compliant with her daily PPI for GERD.  She notes a few days of sore throat and cold symptoms surrounding New Year's which have since resolved.  She said she took her temperature during that time and it was 102F.  She was on Bactrim for PCP prophylaxis for 1 year.  It was stopped three months ago after improvement in CD4.       Review of Systems  Constitutional: Negative for fever and chills.  Respiratory: Negative for shortness of breath.   Gastrointestinal: Positive for nausea, vomiting and diarrhea.  Genitourinary: Negative for dysuria.       Objective:   Physical Exam  Vitals reviewed. Constitutional: She is oriented to person, place, and time. She appears well-developed and well-nourished. No distress.  HENT:  Head: Normocephalic and atraumatic.  Mouth/Throat: Oropharynx is clear and moist. No oropharyngeal exudate.  Eyes: Pupils are equal, round, and reactive to light.  Neck: Neck supple.  Cardiovascular: Normal rate, regular rhythm and normal heart sounds.   Pulmonary/Chest: Effort normal and breath sounds normal. No respiratory distress. She has no wheezes. She has no  rales.  Abdominal: Soft. Bowel sounds are normal. She exhibits no distension. There is no tenderness.  Musculoskeletal: Normal range of motion.  Neurological: She is alert and oriented to person, place, and time.  Skin: Skin is warm. She is not diaphoretic.  Psychiatric: She has a normal mood and affect. Her behavior is normal.          Assessment & Plan:  Please see problem based assessment and plan.

## 2013-12-12 ENCOUNTER — Other Ambulatory Visit: Payer: Self-pay | Admitting: Internal Medicine

## 2013-12-12 ENCOUNTER — Other Ambulatory Visit: Payer: Medicare Other

## 2013-12-12 DIAGNOSIS — R197 Diarrhea, unspecified: Secondary | ICD-10-CM

## 2013-12-12 LAB — FERRITIN: Ferritin: 171 ng/mL (ref 10–291)

## 2013-12-12 LAB — VITAMIN B12: VITAMIN B 12: 545 pg/mL (ref 211–911)

## 2013-12-13 DIAGNOSIS — R197 Diarrhea, unspecified: Secondary | ICD-10-CM | POA: Insufficient documentation

## 2013-12-13 LAB — CLOSTRIDIUM DIFFICILE EIA: CDIFTX: NEGATIVE

## 2013-12-13 NOTE — Assessment & Plan Note (Addendum)
Assessment:  Persistent diarrhea with 8 pound weight loss.  She is hemodynamically stable.  She was on Bactrim for about 1 year for PCP prophylaxis.  This was stopped about 3 months ago after improvement in her CD4 count.  There is concern for c. difficile infection given this antibiotic exposure. She initially had watery diarrhea which is now loose and she notes increased burping which can be features of Giardia.   Doubt cryptosporidium or CMV given her CD4 is 210.  HIV enteropathy or malabsorption are other possibilities.    Plan:   1) She has agreed to return stool sample to clinic for testing.  2) Send stool for O&P, culture and c difficile toxin.             3) CBC, BMP, B12             4) She was advised to call the clinic with new symptoms and to go to the ED if severe symptoms.

## 2013-12-15 ENCOUNTER — Ambulatory Visit: Payer: Medicare Other | Admitting: Physical Therapy

## 2013-12-15 LAB — OVA AND PARASITE SCREEN: OP: NONE SEEN

## 2013-12-15 NOTE — Progress Notes (Signed)
Case discussed with Dr. Redmond Pulling at the time of the visit.  We reviewed the resident's history and exam and pertinent patient test results.  I agree with the assessment, diagnosis, and plan of care documented in the resident's note.2

## 2013-12-16 ENCOUNTER — Ambulatory Visit: Payer: Medicare Other | Attending: Internal Medicine | Admitting: Physical Therapy

## 2013-12-16 DIAGNOSIS — Z21 Asymptomatic human immunodeficiency virus [HIV] infection status: Secondary | ICD-10-CM | POA: Insufficient documentation

## 2013-12-16 DIAGNOSIS — IMO0001 Reserved for inherently not codable concepts without codable children: Secondary | ICD-10-CM | POA: Insufficient documentation

## 2013-12-16 DIAGNOSIS — R5381 Other malaise: Secondary | ICD-10-CM | POA: Insufficient documentation

## 2013-12-16 DIAGNOSIS — Z940 Kidney transplant status: Secondary | ICD-10-CM | POA: Insufficient documentation

## 2013-12-16 DIAGNOSIS — E119 Type 2 diabetes mellitus without complications: Secondary | ICD-10-CM | POA: Insufficient documentation

## 2013-12-16 DIAGNOSIS — R269 Unspecified abnormalities of gait and mobility: Secondary | ICD-10-CM | POA: Insufficient documentation

## 2013-12-16 DIAGNOSIS — I10 Essential (primary) hypertension: Secondary | ICD-10-CM | POA: Insufficient documentation

## 2013-12-18 ENCOUNTER — Ambulatory Visit: Payer: Medicare Other | Admitting: Physical Therapy

## 2013-12-19 ENCOUNTER — Telehealth: Payer: Self-pay | Admitting: Internal Medicine

## 2013-12-19 LAB — STOOL CULTURE

## 2013-12-19 NOTE — Telephone Encounter (Signed)
I called to inform [name] that her stool studies studies were negative.  She feels her diarrhea has improving and thinks it was related to diet.  I advised her to return to clinic sooner if her symptoms worsened.  She has follow-up with [facility] and clinic in 2 months.

## 2013-12-22 ENCOUNTER — Ambulatory Visit: Payer: Medicare Other | Admitting: Physical Therapy

## 2013-12-25 ENCOUNTER — Ambulatory Visit: Payer: Medicare Other | Admitting: Physical Therapy

## 2013-12-29 ENCOUNTER — Ambulatory Visit: Payer: Medicare Other | Admitting: Physical Therapy

## 2014-01-01 ENCOUNTER — Ambulatory Visit: Payer: Medicare Other | Admitting: Physical Therapy

## 2014-01-05 ENCOUNTER — Ambulatory Visit: Payer: Medicare Other | Attending: Internal Medicine | Admitting: Physical Therapy

## 2014-01-05 DIAGNOSIS — IMO0001 Reserved for inherently not codable concepts without codable children: Secondary | ICD-10-CM | POA: Insufficient documentation

## 2014-01-05 DIAGNOSIS — R5381 Other malaise: Secondary | ICD-10-CM | POA: Insufficient documentation

## 2014-01-05 DIAGNOSIS — R269 Unspecified abnormalities of gait and mobility: Secondary | ICD-10-CM | POA: Insufficient documentation

## 2014-01-08 ENCOUNTER — Ambulatory Visit: Payer: Medicare Other | Admitting: Physical Therapy

## 2014-01-12 ENCOUNTER — Ambulatory Visit: Payer: Medicare Other | Admitting: Physical Therapy

## 2014-01-13 ENCOUNTER — Other Ambulatory Visit: Payer: Medicare Other

## 2014-01-15 ENCOUNTER — Ambulatory Visit: Payer: Medicare Other | Admitting: Physical Therapy

## 2014-01-20 ENCOUNTER — Other Ambulatory Visit: Payer: Medicare Other

## 2014-01-22 ENCOUNTER — Other Ambulatory Visit: Payer: Medicare Other

## 2014-01-22 DIAGNOSIS — B20 Human immunodeficiency virus [HIV] disease: Secondary | ICD-10-CM

## 2014-01-22 LAB — CBC WITH DIFFERENTIAL/PLATELET
BASOS ABS: 0.1 10*3/uL (ref 0.0–0.1)
Basophils Relative: 1 % (ref 0–1)
Eosinophils Absolute: 0.7 10*3/uL (ref 0.0–0.7)
Eosinophils Relative: 11 % — ABNORMAL HIGH (ref 0–5)
HCT: 38.4 % (ref 36.0–46.0)
HEMOGLOBIN: 13.6 g/dL (ref 12.0–15.0)
LYMPHS ABS: 1.4 10*3/uL (ref 0.7–4.0)
Lymphocytes Relative: 23 % (ref 12–46)
MCH: 30.8 pg (ref 26.0–34.0)
MCHC: 35.4 g/dL (ref 30.0–36.0)
MCV: 87.1 fL (ref 78.0–100.0)
Monocytes Absolute: 0.5 10*3/uL (ref 0.1–1.0)
Monocytes Relative: 9 % (ref 3–12)
NEUTROS ABS: 3.4 10*3/uL (ref 1.7–7.7)
Neutrophils Relative %: 56 % (ref 43–77)
Platelets: 176 10*3/uL (ref 150–400)
RBC: 4.41 MIL/uL (ref 3.87–5.11)
RDW: 15.2 % (ref 11.5–15.5)
WBC: 6 10*3/uL (ref 4.0–10.5)

## 2014-01-22 LAB — COMPLETE METABOLIC PANEL WITH GFR
ALBUMIN: 3.7 g/dL (ref 3.5–5.2)
ALT: 45 U/L — ABNORMAL HIGH (ref 0–35)
AST: 63 U/L — AB (ref 0–37)
Alkaline Phosphatase: 103 U/L (ref 39–117)
BILIRUBIN TOTAL: 0.5 mg/dL (ref 0.2–1.2)
BUN: 17 mg/dL (ref 6–23)
CO2: 24 mEq/L (ref 19–32)
CREATININE: 1.16 mg/dL — AB (ref 0.50–1.10)
Calcium: 9.4 mg/dL (ref 8.4–10.5)
Chloride: 108 mEq/L (ref 96–112)
GFR, EST NON AFRICAN AMERICAN: 50 mL/min — AB
GFR, Est African American: 57 mL/min — ABNORMAL LOW
GLUCOSE: 111 mg/dL — AB (ref 70–99)
Potassium: 4.3 mEq/L (ref 3.5–5.3)
Sodium: 144 mEq/L (ref 135–145)
Total Protein: 6.6 g/dL (ref 6.0–8.3)

## 2014-01-23 LAB — T-HELPER CELL (CD4) - (RCID CLINIC ONLY)
CD4 T CELL ABS: 240 /uL — AB (ref 400–2700)
CD4 T CELL HELPER: 17 % — AB (ref 33–55)

## 2014-01-24 LAB — HIV-1 RNA ULTRAQUANT REFLEX TO GENTYP+

## 2014-02-02 ENCOUNTER — Ambulatory Visit (INDEPENDENT_AMBULATORY_CARE_PROVIDER_SITE_OTHER): Payer: Medicare Other | Admitting: Infectious Disease

## 2014-02-02 ENCOUNTER — Encounter: Payer: Self-pay | Admitting: Infectious Disease

## 2014-02-02 VITALS — BP 131/80 | HR 60 | Temp 97.7°F | Wt 149.5 lb

## 2014-02-02 DIAGNOSIS — B1920 Unspecified viral hepatitis C without hepatic coma: Secondary | ICD-10-CM

## 2014-02-02 DIAGNOSIS — B20 Human immunodeficiency virus [HIV] disease: Secondary | ICD-10-CM

## 2014-02-02 DIAGNOSIS — R197 Diarrhea, unspecified: Secondary | ICD-10-CM

## 2014-02-02 DIAGNOSIS — Z940 Kidney transplant status: Secondary | ICD-10-CM

## 2014-02-02 MED ORDER — ABACAVIR-DOLUTEGRAVIR-LAMIVUD 600-50-300 MG PO TABS: 1.0000 | ORAL_TABLET | Freq: Every day | ORAL | Status: DC

## 2014-02-02 NOTE — Progress Notes (Signed)
Subjective:    Patient ID: [name], female    DOB: 1945/12/13, 68 y.o.   MRN: [id number]  HPI   68 year-old woman with past medical history is significant for HIV infection, hypertension, diabetes mellitus type II, sp renal transplant > one year ago at [facility]. She has been currently with perfect suppression on Sustiva and Epzicom. I attempted to changed her to [place] this fall but she claims that medicare/medicaid would not pay for it bit we will try yet again today.  She was given rx for Sovaldi and Ribavirin at [facility] for 24 week course of therapy but she has never received or started these meds.   I would think that HARVONI would be a better regimen for her with potential need for only 8 vs 12 weeks of therapy if Medicare/Medicaid will cover it. It would also seem cheaper vs 6 months of Sovaldi/Ribavirin.   Rozann Lesches would also offer a shorter duration of therapy though the Norvir would be a much BIGGER problem with drug drug interactions with her prograf and mycophenolate.   She had a recent problem with diarrhea which has since resolved.   Review of Systems  Constitutional: Negative for fever, chills, diaphoresis, activity change, appetite change, fatigue and unexpected weight change.  HENT: Negative for congestion, rhinorrhea, sinus pressure, sneezing, sore throat and trouble swallowing.   Eyes: Negative for photophobia and visual disturbance.  Respiratory: Negative for cough, chest tightness, shortness of breath, wheezing and stridor.   Cardiovascular: Negative for chest pain, palpitations and leg swelling.  Gastrointestinal: Negative for nausea, vomiting, abdominal pain, diarrhea, constipation, blood in stool, abdominal distention and anal bleeding.  Genitourinary: Negative for dysuria, hematuria, flank pain and difficulty urinating.  Musculoskeletal: Negative for back pain, gait problem, joint swelling and myalgias.  Skin: Negative for color change, pallor, rash and  wound.  Neurological: Negative for dizziness, tremors, weakness and light-headedness.  Hematological: Negative for adenopathy. Does not bruise/bleed easily.  Psychiatric/Behavioral: Negative for behavioral problems, confusion, sleep disturbance, dysphoric mood, decreased concentration and agitation.       Objective:   Physical Exam  Constitutional: She is oriented to person, place, and time. She appears well-developed and well-nourished. No distress.  HENT:  Head: Normocephalic and atraumatic.  Mouth/Throat: Oropharynx is clear and moist. No oropharyngeal exudate.  Eyes: Conjunctivae and EOM are normal. Pupils are equal, round, and reactive to light. No scleral icterus.  Neck: Normal range of motion. Neck supple. No JVD present.  Cardiovascular: Normal rate, regular rhythm and normal heart sounds.  Exam reveals no gallop and no friction rub.   No murmur heard. Pulmonary/Chest: Effort normal and breath sounds normal. No respiratory distress. She has no wheezes. She has no rales. She exhibits no tenderness.  Abdominal: She exhibits no distension and no mass. There is no tenderness. There is no rebound and no guarding.  Musculoskeletal: She exhibits no edema and no tenderness.  Lymphadenopathy:    She has no cervical adenopathy.  Neurological: She is alert and oriented to person, place, and time. Coordination normal.  Skin: Skin is warm and dry. She is not diaphoretic. No erythema. No pallor.  Psychiatric: She has a normal mood and affect. Her behavior is normal. Judgment and thought content normal.          Assessment & Plan:  HIV: Send E script for  Triumeq ONCE again for  dosing simplicity and one pill once a day also with higher barrier to R.   Hepatitis C:  genotype 1b,: [facility] rx Sofosbuvir and Ribavirin x 6 months, but she has never received meds. IF her payor source would cover it SOVALDI x 3 months would seem much more tolerable and effective and even cheaper with a shorter  duration of therapy. Perhaps payor source might insist on Linzie Collin but that will be [facility] more of a Headache with drug interactions with her mycophenolate , tacrolimus.  I have asked her to re-visit this with her GI MDs at [facility] and also emphasized to her that we can rx these medicines also for her at [facility] which might be much more convenient for her  I spent greater than 25 minutes with the patient including greater than 50% of time in face to face counsel of the patient and in coordination of their care.   Renal transplantation: being followed VERY closely at [facility], and renal fxn is best I have seen in some time for her will also cc notes to Dr. Jimmy Footman   DM: being followed by Endocrine, and Dr Hayes Ludwig here in [facility].

## 2014-02-02 NOTE — Patient Instructions (Signed)
Ask your Liver doctors re two newer regimens for Hep C:  HARVONI  Or   [place] prefer Harvoni myself though medicare/medicaid may have opinions   BOTH would be MUCH shorter durations, ie 2-3 months instead of 6 months although the [place] would be more complicated with your transplant meds

## 2014-02-09 ENCOUNTER — Encounter: Payer: Self-pay | Admitting: Internal Medicine

## 2014-02-09 ENCOUNTER — Ambulatory Visit (INDEPENDENT_AMBULATORY_CARE_PROVIDER_SITE_OTHER): Payer: Medicare Other | Admitting: Internal Medicine

## 2014-02-09 VITALS — BP 138/85 | HR 72 | Temp 97.4°F | Wt 152.5 lb

## 2014-02-09 DIAGNOSIS — E785 Hyperlipidemia, unspecified: Secondary | ICD-10-CM

## 2014-02-09 DIAGNOSIS — I10 Essential (primary) hypertension: Secondary | ICD-10-CM

## 2014-02-09 DIAGNOSIS — E119 Type 2 diabetes mellitus without complications: Secondary | ICD-10-CM

## 2014-02-09 DIAGNOSIS — B1710 Acute hepatitis C without hepatic coma: Secondary | ICD-10-CM

## 2014-02-09 DIAGNOSIS — R197 Diarrhea, unspecified: Secondary | ICD-10-CM

## 2014-02-09 DIAGNOSIS — B20 Human immunodeficiency virus [HIV] disease: Secondary | ICD-10-CM

## 2014-02-09 LAB — LIPID PANEL
Cholesterol: 189 mg/dL (ref 0–200)
HDL: 73 mg/dL
LDL Cholesterol: 89 mg/dL (ref 0–99)
Total CHOL/HDL Ratio: 2.6 ratio
Triglycerides: 133 mg/dL
VLDL: 27 mg/dL (ref 0–40)

## 2014-02-09 LAB — POCT GLYCOSYLATED HEMOGLOBIN (HGB A1C): Hemoglobin A1C: 6.5

## 2014-02-09 LAB — GLUCOSE, CAPILLARY: Glucose-Capillary: 143 mg/dL — ABNORMAL HIGH (ref 70–99)

## 2014-02-09 NOTE — Assessment & Plan Note (Addendum)
Lab Results  Component Value Date   HGBA1C 6.5 02/09/2014   HGBA1C 6.3 11/10/2013   HGBA1C 6.4 08/11/2013     Assessment: Diabetes control: fair control Progress toward A1C goal:  unchanged Comments: Continues to be followed at [facility] with no medication changes. Does not have her CBG meter with her today but reports CBGs in the 100s with only one hypoglycemic episode with CBG pf 64 and dizziness, corrected with 2 pieces of candy and juice, she is not sure about what caused this but thinks she may have skipped a meal.   Plan: Medications:  continue current medications Home glucose monitoring: Frequency: 3 times a day Timing: before meals Instruction/counseling given: reminded to bring blood glucose meter & log to each visit, discussed foot care and discussed diet Educational resources provided: brochure Self management tools provided: home glucose logbook Other plans: Patient reminded to bring CBG meter during her next visit. Will continue following with [facility]. Right big toe rubbing against her second toe, wants podiatry referral, referred to Podiatry.

## 2014-02-09 NOTE — Progress Notes (Signed)
   Subjective:    Patient ID: [name], female    DOB: [birth year], 68 y.o.   MRN: [id number]  HPI Ms. Carsten is a very pleasant 68 y old woman with PMH significant for HIV, CKD s/p renal transplant, DM2, who presents for follow up visit for her diabetes and hypertension. She states that she restarted taking hydralazine this week after seeing her [facility] transplant team for her elevated BP. The diarrhea she had in January is slowly improving though she notes that she has diarrhea when she eats fried foods--as long as she does not eat fried foods she does not have diarrhea.  She complains of right big toe discomfort as her shoes feel "tight" making her 1st and 2nd right toes rub together.  She is planning a trip to [place] in August but will try to coordinate all her medical appointments before her she goes.    Review of Systems  Constitutional: Negative for fever, chills, diaphoresis, activity change, appetite change, fatigue and unexpected weight change.  HENT: Negative for congestion.   Respiratory: Negative for cough, shortness of breath and wheezing.   Cardiovascular: Negative for chest pain, palpitations and leg swelling.  Gastrointestinal: Positive for diarrhea. Negative for nausea, vomiting and abdominal pain.  Genitourinary: Negative for dysuria and difficulty urinating.  Musculoskeletal: Negative for back pain.  Neurological: Negative for dizziness, weakness, light-headedness and headaches.  Psychiatric/Behavioral: Negative for behavioral problems and agitation.       Objective:   Physical Exam  Nursing note and vitals reviewed. Constitutional: She is oriented to person, place, and time. She appears well-developed and well-nourished. No distress.  Eyes: Conjunctivae are normal. No scleral icterus.  Cardiovascular: Normal rate and regular rhythm.   Pulmonary/Chest: No respiratory distress. She has no wheezes. She has no rales.  Abdominal: Soft. Bowel sounds are normal. She exhibits  no distension. There is no tenderness.  Musculoskeletal: She exhibits no edema and no tenderness.  Neurological: She is alert and oriented to person, place, and time.  Skin: Skin is warm and dry. No rash noted. She is not diaphoretic. No erythema. No pallor.  Psychiatric: She has a normal mood and affect. Her behavior is normal.          Assessment & Plan:

## 2014-02-09 NOTE — Assessment & Plan Note (Signed)
Continues to follow up with ID, seen by Dr. Tommy Medal on 3/2. Assures compliance with her medications.

## 2014-02-09 NOTE — Assessment & Plan Note (Addendum)
BP Readings from Last 3 Encounters:  02/09/14 138/85  02/02/14 131/80  12/11/13 134/69    Lab Results  Component Value Date   NA 144 01/22/2014   K 4.3 01/22/2014   CREATININE 1.16* 01/22/2014    Assessment: Blood pressure control: controlled Progress toward BP goal:  at goal Comments: On Norvasc 10mg  daily, Coreg 6.25 BID, hydralazine 25mg  TID. Had stopped hydralazine after seeing Dr. Jimmy Footman but back on it since 3/2.   Plan: Medications:  continue current medications Educational resources provided: brochure Self management tools provided: home blood pressure logbook Other plans: Follow up with [place] in 3 months.

## 2014-02-09 NOTE — Patient Instructions (Signed)
General Instructions: -Your blood pressure is well controlled today. Continue taking the Norvasc, Coreg, and the hydralazine.  -Your diabetes is under control with very slight increase in your HgA1C. Continue eating a healthy diet and exercising as tolerated.  -We will check your cholesterol today. We will call you if we need to change your cholesterol medication.  -Follow up in 3 months.  -It was great seeing you again!   Treatment Goals:  Goals (1 Years of Data) as of 02/09/14         As of Today As of Today 02/02/14 12/11/13 11/10/13     Blood Pressure    . Blood Pressure < 140/90  138/85 187/82 131/80 134/69 158/85     Result Component    . HEMOGLOBIN A1C < 7.0  6.5    6.3    . LDL CALC < 100            Progress Toward Treatment Goals:  Treatment Goal 02/09/2014  Hemoglobin A1C unchanged  Blood pressure at goal    Self Care Goals & Plans:  Self Care Goal 02/09/2014  Manage my medications take my medicines as prescribed; refill my medications on time  Monitor my health keep track of my blood glucose; bring my glucose meter and log to each visit; keep track of my blood pressure; bring my blood pressure log to each visit  Eat healthy foods eat foods that are low in salt; eat baked foods instead of fried foods  Be physically active find an activity I enjoy    Home Blood Glucose Monitoring 02/09/2014  Check my blood sugar 3 times a day  When to check my blood sugar before meals     Care Management & Community Referrals:  Referral 02/09/2014  Referrals made for care management support none needed  Referrals made to community resources -

## 2014-02-09 NOTE — Assessment & Plan Note (Signed)
Wants to purse treatment at [facility] for convenience as she feels the process has been started here already since December.

## 2014-02-10 NOTE — Assessment & Plan Note (Signed)
She seems to have intolerance to greasy foods but her diarrhea has much improved. She will avoid fried foods for now.

## 2014-02-11 NOTE — Progress Notes (Signed)
Case discussed with Dr. Kennerly soon after the resident saw the patient.  We reviewed the resident's history and exam and pertinent patient test results.  I agree with the assessment, diagnosis, and plan of care documented in the resident's note. 

## 2014-02-23 ENCOUNTER — Ambulatory Visit: Payer: Self-pay | Admitting: Podiatry

## 2014-03-02 ENCOUNTER — Other Ambulatory Visit: Payer: Medicare Other

## 2014-03-02 ENCOUNTER — Ambulatory Visit (INDEPENDENT_AMBULATORY_CARE_PROVIDER_SITE_OTHER): Payer: Medicare Other | Admitting: Internal Medicine

## 2014-03-02 ENCOUNTER — Ambulatory Visit (HOSPITAL_COMMUNITY)
Admission: RE | Admit: 2014-03-02 | Discharge: 2014-03-02 | Disposition: A | Discharge status: Home / Self Care | Payer: Medicare Other | Source: Ambulatory Visit | Attending: Internal Medicine | Admitting: Internal Medicine

## 2014-03-02 ENCOUNTER — Encounter: Payer: Self-pay | Admitting: Internal Medicine

## 2014-03-02 VITALS — BP 150/79 | HR 61 | Temp 97.3°F | Wt 157.1 lb

## 2014-03-02 DIAGNOSIS — E785 Hyperlipidemia, unspecified: Secondary | ICD-10-CM

## 2014-03-02 DIAGNOSIS — M79609 Pain in unspecified limb: Secondary | ICD-10-CM | POA: Insufficient documentation

## 2014-03-02 DIAGNOSIS — M79601 Pain in right arm: Secondary | ICD-10-CM

## 2014-03-02 DIAGNOSIS — B20 Human immunodeficiency virus [HIV] disease: Secondary | ICD-10-CM

## 2014-03-02 DIAGNOSIS — M25519 Pain in unspecified shoulder: Secondary | ICD-10-CM | POA: Insufficient documentation

## 2014-03-02 DIAGNOSIS — M25473 Effusion, unspecified ankle: Secondary | ICD-10-CM

## 2014-03-02 DIAGNOSIS — M25476 Effusion, unspecified foot: Secondary | ICD-10-CM

## 2014-03-02 LAB — CBC WITH DIFFERENTIAL/PLATELET
Basophils Absolute: 0 10*3/uL (ref 0.0–0.1)
Basophils Relative: 0 % (ref 0–1)
Eosinophils Absolute: 0.8 10*3/uL — ABNORMAL HIGH (ref 0.0–0.7)
Eosinophils Relative: 12 % — ABNORMAL HIGH (ref 0–5)
HEMATOCRIT: 37.3 % (ref 36.0–46.0)
HEMOGLOBIN: 12.8 g/dL (ref 12.0–15.0)
Lymphocytes Relative: 20 % (ref 12–46)
Lymphs Abs: 1.3 10*3/uL (ref 0.7–4.0)
MCH: 30.5 pg (ref 26.0–34.0)
MCHC: 34.3 g/dL (ref 30.0–36.0)
MCV: 88.8 fL (ref 78.0–100.0)
MONO ABS: 0.5 10*3/uL (ref 0.1–1.0)
MONOS PCT: 8 % (ref 3–12)
NEUTROS ABS: 4 10*3/uL (ref 1.7–7.7)
Neutrophils Relative %: 60 % (ref 43–77)
Platelets: 161 10*3/uL (ref 150–400)
RBC: 4.2 MIL/uL (ref 3.87–5.11)
RDW: 16.6 % — AB (ref 11.5–15.5)
WBC: 6.7 10*3/uL (ref 4.0–10.5)

## 2014-03-02 LAB — COMPLETE METABOLIC PANEL WITH GFR
ALBUMIN: 3.5 g/dL (ref 3.5–5.2)
ALK PHOS: 116 U/L (ref 39–117)
ALT: 40 U/L — ABNORMAL HIGH (ref 0–35)
AST: 49 U/L — AB (ref 0–37)
BUN: 22 mg/dL (ref 6–23)
CALCIUM: 9.2 mg/dL (ref 8.4–10.5)
CO2: 27 mEq/L (ref 19–32)
CREATININE: 1.47 mg/dL — AB (ref 0.50–1.10)
Chloride: 104 mEq/L (ref 96–112)
GFR, EST NON AFRICAN AMERICAN: 37 mL/min — AB
GFR, Est African American: 43 mL/min — ABNORMAL LOW
GLUCOSE: 88 mg/dL (ref 70–99)
POTASSIUM: 3.5 meq/L (ref 3.5–5.3)
Sodium: 141 mEq/L (ref 135–145)
Total Bilirubin: 0.5 mg/dL (ref 0.2–1.2)
Total Protein: 6.8 g/dL (ref 6.0–8.3)

## 2014-03-02 LAB — GLUCOSE, CAPILLARY: GLUCOSE-CAPILLARY: 114 mg/dL — AB (ref 70–99)

## 2014-03-02 MED ORDER — ROSUVASTATIN CALCIUM 20 MG PO TABS: 20.0000 mg | ORAL_TABLET | Freq: Every day | ORAL | Status: DC

## 2014-03-02 MED ORDER — HYDROCODONE-ACETAMINOPHEN 5-325 MG PO TABS: 1.0000 | ORAL_TABLET | Freq: Four times a day (QID) | ORAL | Status: DC | PRN

## 2014-03-02 NOTE — Patient Instructions (Signed)
-  You have been scheduled to have a Doppler of your right arm.  -An Xray of your right arm has been ordered, you may have this imaging done at your earliest convenience.  -If your pain is severe, or if you have numbness, cold, or blue discoloration of the arm or hand go to the ED right away.  -Follow up with [place] as needed if the pain dose not improve in the next 1-2 weeks.

## 2014-03-03 DIAGNOSIS — M25473 Effusion, unspecified ankle: Secondary | ICD-10-CM | POA: Insufficient documentation

## 2014-03-03 DIAGNOSIS — M79601 Pain in right arm: Secondary | ICD-10-CM | POA: Insufficient documentation

## 2014-03-03 LAB — HIV-1 RNA QUANT-NO REFLEX-BLD
HIV 1 RNA Quant: <20 copies/mL (ref <20)
HIV-1 RNA Quant, Log: <1.3 {Log} (ref <1.30)

## 2014-03-03 LAB — URINALYSIS, COMPLETE
BACTERIA UA: NONE SEEN
Bilirubin Urine: NEGATIVE
Casts: NONE SEEN
Crystals: NONE SEEN
Glucose, UA: NEGATIVE mg/dL
HGB URINE DIPSTICK: NEGATIVE
Ketones, ur: NEGATIVE mg/dL
LEUKOCYTES UA: NEGATIVE
NITRITE: NEGATIVE
Protein, ur: 100 mg/dL — AB
Specific Gravity, Urine: 1.013 (ref 1.005–1.030)
Squamous Epithelial / LPF: NONE SEEN
Urobilinogen, UA: 0.2 mg/dL (ref 0.0–1.0)
pH: 6.5 (ref 5.0–8.0)

## 2014-03-03 LAB — T-HELPER CELL (CD4) - (RCID CLINIC ONLY)
CD4 % Helper T Cell: 19 % — ABNORMAL LOW (ref 33–55)
CD4 T CELL ABS: 280 /uL — AB (ref 400–2700)

## 2014-03-03 NOTE — Progress Notes (Signed)
Subjective:    Patient ID: [name], female    DOB: 1946-04-18, 68 y.o.   MRN: [id number]  Shoulder Pain  Pertinent negatives include no fever.   [name] is a 68 y old woman with PMH of HIV, ESRD s/p kidney transplant in 10/13, Hep C, HTN, who presents for evaluation of acute right arm pain that started 2.5 weeks ago. She reports that she went to bed one night and woke up with the pain. The pais in described as an ache present in her posterior distal right arm and the upper right scapula with occasional radiation to her right neck. The pain gradually worsened, being an 8/10 as it was the most intense but has improved for the past few days. She describes severe tenderness to palpation of her right scapula last week but this has improved since then. She has taken Bayer aspirin and 2 extra strength Tylenol but the pain did not improve much. She has full ROM of the her right arm and nothing makes it worse or better. She denies recent lifting of heavy objects, recent intense exercise, or falls/injuries to the arm. Denies dizziness or facial swelling, as well as arm swelling/redness, and fever/chills. Of note, she has not had HD since 2013 when she received her kidney transplant, she has two HD grafts, on in her forearm that was clotted years ago, and the other in her right arm that was last accessed for HD in 2013 but has been clotted since her transplant. She does not recall having hand or arm pain like this in the past, even during HD.   She also complains of bilateral mild ankle edema that has been present for almost 3 weeks. The edema worsens after prolonged standing/walking and improves with leg elevation and rest. She does note that her diet has had more sodium than usual recently. She had a visit with her Nephrologist recently with no medication changes but forgot to mention her ankle edema.   Review of Systems  Constitutional: Negative for fever, chills, diaphoresis, activity change,  appetite change, fatigue and unexpected weight change.  HENT: Negative for congestion and facial swelling.   Respiratory: Negative for cough, chest tightness, shortness of breath and wheezing.   Cardiovascular: Positive for leg swelling. Negative for chest pain and palpitations.  Gastrointestinal: Negative for abdominal pain and constipation.  Genitourinary: Negative for dysuria, frequency, decreased urine volume and difficulty urinating.  Musculoskeletal:       Right arm and right scapula pain  Skin: Negative for color change, pallor, rash and wound.  Neurological: Negative for dizziness, syncope, speech difficulty, weakness, light-headedness and headaches.  Hematological: Negative for adenopathy.  Psychiatric/Behavioral: Negative for behavioral problems, confusion and agitation.       Objective:   Physical Exam  Nursing note and vitals reviewed. Constitutional: She is oriented to person, place, and time. She appears well-developed and well-nourished. No distress.  HENT:  No facial edema, no facial asymmetry noted  Eyes: Conjunctivae are normal.  Cardiovascular: Normal rate and regular rhythm.   Pulmonary/Chest: Effort normal and breath sounds normal. No respiratory distress. She has no wheezes. She has no rales.  Abdominal: Soft. Bowel sounds are normal. She exhibits no distension. There is no tenderness.  Musculoskeletal: Normal range of motion. She exhibits edema. She exhibits no tenderness.  Right arm and right scapula with normal ROM with no edema, erythema, non tender to palpation, no signs of trauma or injury.  Hand and arm strength 5/5 bilaterally, sensation intact.  Right radial pulse 2+, ulnar pulse could not be locate.  Right forearm and upper arm AV grafts with no thrill or bruit, with no edema/erythema, non compressible with mild palpation.  Right hand and arm with no cyanosis/bruises, or skin changes, normally warm to touch.  Normal capillary refill of right hand digits.     Trace pitting edema bilaterally from her ankles to 2 inches up her shins. No calf pain or tenderness.   Neurological: She is alert and oriented to person, place, and time.  Skin: Skin is warm and dry. No rash noted. She is not diaphoretic. No erythema. No pallor.  Psychiatric: She has a normal mood and affect. Her behavior is normal.          Assessment & Plan:

## 2014-03-03 NOTE — Assessment & Plan Note (Addendum)
Likely due to dietary indiscretion (increased sodium intake). Ankle edema is bilateral and is improving with leg elevation.  CMP drawn just prior to this visit at the ID clinic with Cr of 1.47 which is close to her baseline Cr of 1.3.  Urinalysis with 100 mg/dL protein but she does have known nephropathy since her transplant.  -Pt counseled on following a low sodium diet and avoidance of ASA and NSAIDs.  -She will continue elevating her legs to help decrease the swelling

## 2014-03-03 NOTE — Assessment & Plan Note (Addendum)
Acute right arm pain that is localized over her distal triceps and upper scapula over her trapezius with occasional radiation to her neck. Differential to include MSK pain, radiculopathy, and vascular insufficiency. MSK pain to include possible bone invasion given her hx of immunosuppression, muscle injury unlikely given no hx of trauma, but muscle strain possible given hx of deconditioning in the past. Radiculopathy unlikely given no hx of osteoarthritis, or hx of shoulder injury, no Neurologic deficits (weakness or paresthesias). Vascular insufficiency possible in the setting of bypass of arm vasculature due to 2 AV grafts thought both have been clotted for months to years and she likely has formed good collateral flow to her arm. Her pain is improving but I counseled her in the importance of avoiding ASA and NSAIDs to prevent further damage of her kidney transplant.  -Norco 5-325mg  q6h PRN for pain #20 -Ordered Xray of her arm/scapula/hand>>done after her visit and negative for bony abnormalities -Ordered Doppler of right arm with AV graft study as well (appreciate assistance from [place] and the vascular lab in ordering/scheduling this study) -Pt advised to follow up with the [facility] PR[facility] if her arm pain worsens or persists as she may benefit from referral to Sports Medicine or PT

## 2014-03-04 NOTE — Progress Notes (Signed)
I saw and evaluated the patient.  I personally confirmed the key portions of Dr. Bernadene Bell history and exam and reviewed pertinent patient test results.  The assessment, diagnosis, and plan were formulated together and I agree with the documentation in the resident's note.

## 2014-03-06 ENCOUNTER — Telehealth: Payer: Self-pay | Admitting: Internal Medicine

## 2014-03-06 ENCOUNTER — Ambulatory Visit (HOSPITAL_COMMUNITY)
Admission: RE | Admit: 2014-03-06 | Discharge: 2014-03-06 | Disposition: A | Discharge status: Home / Self Care | Payer: Medicare Other | Source: Ambulatory Visit | Attending: Internal Medicine | Admitting: Internal Medicine

## 2014-03-06 DIAGNOSIS — M79609 Pain in unspecified limb: Secondary | ICD-10-CM

## 2014-03-06 DIAGNOSIS — M79601 Pain in right arm: Secondary | ICD-10-CM

## 2014-03-06 NOTE — Telephone Encounter (Signed)
   Reason for call:   I received a call from vascular lab in regards to [name] at 11:33 AM indicating that she just completed her vascular ultrasound of upper extremity as ordered by Dr. Hayes Ludwig on last clinic visit.  The preliminary results show occluded right brachial to axilllary PTFE graft with patent venous outflow at axillary and subclavian vein segments and PPG of b/l upper extremity digits are within normal limits.    Pertinent Data:   Recent clinic visit with PCP for R arm pain on 03/02/14 with Dr. Hayes Ludwig  Per pcp note: has not had HD since 2013 when she received kidney transplant  Has 2 HD grafts on forearm that have clotted years ago  Differentials for pain included MSK, radiculopathy, and vascular insufficiency at the time of visit.  Concern for possible vascular insufficiency in arm led to doppler of right arm with AV graft study  Preliminary results of study listed above   Assessment / Plan / Recommendations:   [name] is a 68 year old female with HIV, ESRD s/p renal transplant, hep C, HTN recently seen in OCP for R arm pain.  Preliminary doppler study of right arm revealed occluded graft but with patent venous outflow and WNL PPG evaluation of upper extremity digits.   I asked tech to keep the patient and that I would call her back after discussing with my attending in case this needs further evaluation.   Discussed with Dr. Ellwood Dense on the telephone who initially felt given patent outflow and hx of occluded grafts as documented that likely okay for patient to leave the hospital based on just reviewing the chart and hearing the findings and return if needed as per PCP instructions, however, she also recommended evaluating the arm prior to patient departure just in case she has new or worsening complaints.  Unfortunately, upon calling back the vascular tech, the patient had been sent home as they said they do not hold "non-urgent" cases and sometimes do not  hear back from MD in time.  I notified Dr. Ellwood Dense the patient had left the hospital and also called the patient's cell phone in hope's of trying to reach her and left a voice mail for her to call back to check on how she is doing and inquire about the status of pain.  Messages were left on both cell phone and home number listed on epic.  As always, pt is advised that if symptoms worsen or new symptoms arise, they should go to an urgent care facility or to to ER for further evaluation.  I will forward this encounter to PCP who can hopefully touch base with her as soon as possible and review the report of the study.   Jerene Pitch, MD   03/06/2014, 4:36 PM

## 2014-03-06 NOTE — Progress Notes (Signed)
*  PRELIMINARY RESULTS* Vascular Ultrasound Duplex Dialysis Access (AVF, AGV) has been completed.  Occluded right brachial to axillary PTFE graft with patent venous outflow at the axillary and subclavian vein segments.  PPG evaluation of bilateral upper extremity digits are within normal limits.  03/06/2014  Maudry Mayhew, RVT, RDCS, RDMS for [facility], RVT

## 2014-03-09 ENCOUNTER — Telehealth: Payer: Self-pay | Admitting: Internal Medicine

## 2014-03-09 ENCOUNTER — Encounter: Payer: Self-pay | Admitting: Podiatry

## 2014-03-09 ENCOUNTER — Ambulatory Visit (INDEPENDENT_AMBULATORY_CARE_PROVIDER_SITE_OTHER): Payer: Medicare Other | Admitting: Podiatry

## 2014-03-09 VITALS — BP 148/68 | HR 64 | Resp 12

## 2014-03-09 DIAGNOSIS — M2040 Other hammer toe(s) (acquired), unspecified foot: Secondary | ICD-10-CM

## 2014-03-09 DIAGNOSIS — E785 Hyperlipidemia, unspecified: Secondary | ICD-10-CM

## 2014-03-09 MED ORDER — ROSUVASTATIN CALCIUM 20 MG PO TABS: 20.0000 mg | ORAL_TABLET | Freq: Every day | ORAL | Status: DC

## 2014-03-09 NOTE — Patient Instructions (Signed)
Wear the toe wedge between the first and second right toes daily when wearing shoes.  Diabetes and Foot Care Diabetes may cause you to have problems because of poor blood supply (circulation) to your feet and legs. This may cause the skin on your feet to become thinner, break easier, and heal more slowly. Your skin may become dry, and the skin may peel and crack. You may also have nerve damage in your legs and feet causing decreased feeling in them. You may not notice minor injuries to your feet that could lead to infections or more serious problems. Taking care of your feet is one of the most important things you can do for yourself.  HOME CARE INSTRUCTIONS  Wear shoes at all times, even in the house. Do not go barefoot. Bare feet are easily injured.  Check your feet daily for blisters, cuts, and redness. If you cannot see the bottom of your feet, use a mirror or ask someone for help.  Wash your feet with warm water (do not use hot water) and mild soap. Then pat your feet and the areas between your toes until they are completely dry. Do not soak your feet as this can dry your skin.  Apply a moisturizing lotion or petroleum jelly (that does not contain alcohol and is unscented) to the skin on your feet and to dry, brittle toenails. Do not apply lotion between your toes.  Trim your toenails straight across. Do not dig under them or around the cuticle. File the edges of your nails with an emery board or nail file.  Do not cut corns or calluses or try to remove them with medicine.  Wear clean socks or stockings every day. Make sure they are not too tight. Do not wear knee-high stockings since they may decrease blood flow to your legs.  Wear shoes that fit properly and have enough cushioning. To break in new shoes, wear them for just a few hours a day. This prevents you from injuring your feet. Always look in your shoes before you put them on to be sure there are no objects inside.  Do not cross  your legs. This may decrease the blood flow to your feet.  If you find a minor scrape, cut, or break in the skin on your feet, keep it and the skin around it clean and dry. These areas may be cleansed with mild soap and water. Do not cleanse the area with peroxide, alcohol, or iodine.  When you remove an adhesive bandage, be sure not to damage the skin around it.  If you have a wound, look at it several times a day to make sure it is healing.  Do not use heating pads or hot water bottles. They may burn your skin. If you have lost feeling in your feet or legs, you may not know it is happening until it is too late.  Make sure your health care provider performs a complete foot exam at least annually or more often if you have foot problems. Report any cuts, sores, or bruises to your health care provider immediately. SEEK MEDICAL CARE IF:   You have an injury that is not healing.  You have cuts or breaks in the skin.  You have an ingrown nail.  You notice redness on your legs or feet.  You feel burning or tingling in your legs or feet.  You have pain or cramps in your legs and feet.  Your legs or feet are numb.  Your feet always feel cold. SEEK IMMEDIATE MEDICAL CARE IF:   There is increasing redness, swelling, or pain in or around a wound.  There is a red line that goes up your leg.  Pus is coming from a wound.  You develop a fever or as directed by your health care provider.  You notice a bad smell coming from an ulcer or wound. Document Released: 11/17/2000 Document Revised: 07/23/2013 Document Reviewed: 04/29/2013 [facility] Patient Information 2014 [place].

## 2014-03-09 NOTE — Telephone Encounter (Signed)
Called patient to inform her of the results of her arm/shoulder xray that were negative for bony abnormalities. Her Duplex AVG graft study showed clotted AV grafts but patent venous flow with normal flow to the upper extremity digits.    She says that the pain had much improved and she has not had it in two days. She was advised to call us if her pain returns and/or worsens so we can consider further work up or even referral to Vascular Surgery.   She also requests refill of Crestor that was called in on 3/30 but the Pharmacy did not have the refill request.   -Refilled Crestor 20mg  daily

## 2014-03-09 NOTE — Progress Notes (Signed)
   Subjective:    Patient ID: [name], female    DOB: 03/23/1946, 68 y.o.   MRN: [id number]  HPI PT STATED RT FOOT 2ND TOE HAVE A KNOT AND IS RUBBING WITH THE GREAT TOE FOR 3 YEARS. THE TOE IS HURTING AND GETTING WORSE. THE 2ND TOE GET AGGRAVATED WITH THE GREAT TOE AND WEARING SHOES. TRIED NO TREATMENT.    Review of Systems  Constitutional: Positive for fatigue.  Cardiovascular: Positive for leg swelling.  Hematological: Bruises/bleeds easily.       Objective:   Physical Exam  Orientated x64 68 year old black female  Vascular: DP is are 2/4 bilaterally. PTs are 1/4 bilaterally  Neurological: Sensation to 10 g monofilament wire intact for 4/5 bilaterally. Vibratory sensation intact bilaterally. Ankle reflexes reactive bilaterally.  Dermatological: Texture and turgor within normal limits fibrous thickening along the medial border second right toe.  Musculoskeletal: The second right toe is somewhat C-shaped and on the transverse plane the distal aspect of the toe is abducted, making the medial aspect of the second right toe prominent and rubbing against the right hallux, which is her area of concern.        Assessment & Plan:   Assessment: Diminished pedal pulses suggestive of possible peripheral arterial disease bilaterally Protective sensation is intact bilaterally Transverse plane deformity second right toe with associated friction rub of the second toe against the right hallux  Plan: A silicone which was dispensed to assert in between the hallux and second right toe. Information about diabetic footcare provided to patient.  Reappoint at patient's request regularly intervals.

## 2014-03-10 ENCOUNTER — Encounter: Payer: Self-pay | Admitting: Podiatry

## 2014-03-11 ENCOUNTER — Ambulatory Visit (INDEPENDENT_AMBULATORY_CARE_PROVIDER_SITE_OTHER): Payer: Medicare Other | Admitting: Infectious Disease

## 2014-03-11 ENCOUNTER — Encounter: Payer: Self-pay | Admitting: Infectious Disease

## 2014-03-11 VITALS — BP 152/80 | HR 69 | Temp 98.1°F | Ht 62.0 in | Wt 158.0 lb

## 2014-03-11 DIAGNOSIS — B20 Human immunodeficiency virus [HIV] disease: Secondary | ICD-10-CM

## 2014-03-11 DIAGNOSIS — Z113 Encounter for screening for infections with a predominantly sexual mode of transmission: Secondary | ICD-10-CM

## 2014-03-11 DIAGNOSIS — E1159 Type 2 diabetes mellitus with other circulatory complications: Secondary | ICD-10-CM

## 2014-03-11 DIAGNOSIS — B159 Hepatitis A without hepatic coma: Secondary | ICD-10-CM

## 2014-03-11 DIAGNOSIS — E785 Hyperlipidemia, unspecified: Secondary | ICD-10-CM

## 2014-03-11 DIAGNOSIS — Z940 Kidney transplant status: Secondary | ICD-10-CM

## 2014-03-11 NOTE — Progress Notes (Signed)
Subjective:    Patient ID: [name], female    DOB: 02-Mar-1946, 68 y.o.   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 She has been currently with perfect suppression on Sustiva and Epzicom. I attempted to changed her to [place] this fall but she claims that medicare/medicaid would not pay for it bit we will try yet again today.  She was given rx for [place] but not yet filled due to hold up with insurance.   Her serum Cr is slightly up to 1.47 from 1.16 when last seen.    Review of Systems  Constitutional: Negative for fever, chills, diaphoresis, activity change, appetite change, fatigue and unexpected weight change.  HENT: Negative for congestion, rhinorrhea, sinus pressure, sneezing, sore throat and trouble swallowing.   Eyes: Negative for photophobia and visual disturbance.  Respiratory: Negative for cough, chest tightness, shortness of breath, wheezing and stridor.   Cardiovascular: Negative for chest pain, palpitations and leg swelling.  Gastrointestinal: Negative for nausea, vomiting, abdominal pain, diarrhea, constipation, blood in stool, abdominal distention and anal bleeding.  Genitourinary: Negative for dysuria, hematuria, flank pain and difficulty urinating.  Musculoskeletal: Negative for back pain, gait problem, joint swelling and myalgias.  Skin: Negative for color change, pallor, rash and wound.  Neurological: Negative for dizziness, tremors, weakness and light-headedness.  Hematological: Negative for adenopathy. Does not bruise/bleed easily.  Psychiatric/Behavioral: Negative for behavioral problems, confusion, sleep disturbance, dysphoric mood, decreased concentration and agitation.       Objective:   Physical Exam  Constitutional: She is oriented to person, place, and time. She appears well-developed and  well-nourished. No distress.  HENT:  Head: Normocephalic and atraumatic.  Mouth/Throat: Oropharynx is clear and moist. No oropharyngeal exudate.  Eyes: Conjunctivae and EOM are normal. Pupils are equal, round, and reactive to light. No scleral icterus.  Neck: Normal range of motion. Neck supple. No JVD present.  Cardiovascular: Normal rate, regular rhythm and normal heart sounds.  Exam reveals no gallop and no friction rub.   No murmur heard. Pulmonary/Chest: Effort normal and breath sounds normal. No respiratory distress. She has no wheezes. She has no rales. She exhibits no tenderness.  Abdominal: She exhibits no distension and no mass. There is no tenderness. There is no rebound and no guarding.  Musculoskeletal: She exhibits no edema and no tenderness.  Lymphadenopathy:    She has no cervical adenopathy.  Neurological: She is alert and oriented to person, place, and time. Coordination normal.  Skin: Skin is warm and dry. She is not diaphoretic. No erythema. No pallor.  Psychiatric: She has a normal mood and affect. Her behavior is normal. Judgment and thought content normal.          Assessment & Plan:  HIV:  I am still comfortable with coformulated  Triumeq ONCE again for  dosing simplicity and one pill once a day also with higher barrier to R. Technically the lamivudine would need to be renally adjusted for GFR checked most recently and if her nephrologist or transplant MD's feel strongly about this we can split up into component parts again  I spent greater than 25 minutes with the patient including greater than 50% of time in face to face counsel of the patient and in coordination of their care.  Hepatitis C: genotype 1b,: [facility] rx SOVALDI x 3 months  Renal transplantation: being followed VERY closely at [facility], and renal fxn is best I have seen in some time for her will also cc notes to Dr. Jimmy Footman   DM: being followed by Endocrine, and Dr Hayes Ludwig here in [facility] at  [facility].

## 2014-03-12 ENCOUNTER — Other Ambulatory Visit: Payer: Self-pay

## 2014-03-31 ENCOUNTER — Other Ambulatory Visit: Payer: Medicare Other

## 2014-04-08 ENCOUNTER — Ambulatory Visit: Payer: Medicare Other | Admitting: Infectious Disease

## 2014-05-11 ENCOUNTER — Encounter: Payer: Medicare Other | Admitting: Internal Medicine

## 2014-06-08 ENCOUNTER — Encounter: Payer: Medicare Other | Admitting: Internal Medicine

## 2014-06-22 ENCOUNTER — Ambulatory Visit (INDEPENDENT_AMBULATORY_CARE_PROVIDER_SITE_OTHER): Payer: Medicare Other | Admitting: Internal Medicine

## 2014-06-22 ENCOUNTER — Encounter: Payer: Self-pay | Admitting: Internal Medicine

## 2014-06-22 VITALS — BP 132/77 | HR 61 | Temp 97.2°F | Wt 161.4 lb

## 2014-06-22 DIAGNOSIS — N059 Unspecified nephritic syndrome with unspecified morphologic changes: Secondary | ICD-10-CM

## 2014-06-22 DIAGNOSIS — B1710 Acute hepatitis C without hepatic coma: Secondary | ICD-10-CM | POA: Diagnosis not present

## 2014-06-22 DIAGNOSIS — E119 Type 2 diabetes mellitus without complications: Secondary | ICD-10-CM | POA: Diagnosis not present

## 2014-06-22 DIAGNOSIS — Z940 Kidney transplant status: Secondary | ICD-10-CM | POA: Diagnosis not present

## 2014-06-22 DIAGNOSIS — R202 Paresthesia of skin: Secondary | ICD-10-CM

## 2014-06-22 DIAGNOSIS — E1159 Type 2 diabetes mellitus with other circulatory complications: Secondary | ICD-10-CM | POA: Diagnosis not present

## 2014-06-22 DIAGNOSIS — R209 Unspecified disturbances of skin sensation: Secondary | ICD-10-CM

## 2014-06-22 DIAGNOSIS — Z113 Encounter for screening for infections with a predominantly sexual mode of transmission: Secondary | ICD-10-CM | POA: Diagnosis not present

## 2014-06-22 DIAGNOSIS — I10 Essential (primary) hypertension: Secondary | ICD-10-CM

## 2014-06-22 DIAGNOSIS — E785 Hyperlipidemia, unspecified: Secondary | ICD-10-CM | POA: Diagnosis not present

## 2014-06-22 DIAGNOSIS — B159 Hepatitis A without hepatic coma: Secondary | ICD-10-CM | POA: Diagnosis not present

## 2014-06-22 DIAGNOSIS — Z0000 Encounter for general adult medical examination without abnormal findings: Secondary | ICD-10-CM

## 2014-06-22 DIAGNOSIS — B20 Human immunodeficiency virus [HIV] disease: Secondary | ICD-10-CM | POA: Diagnosis present

## 2014-06-22 LAB — POCT GLYCOSYLATED HEMOGLOBIN (HGB A1C): HEMOGLOBIN A1C: 7.4

## 2014-06-22 LAB — GLUCOSE, CAPILLARY: Glucose-Capillary: 128 mg/dL — ABNORMAL HIGH (ref 70–99)

## 2014-06-22 MED ORDER — FUROSEMIDE 20 MG PO TABS: 40.0000 mg | ORAL_TABLET | Freq: Every day | ORAL | Status: DC

## 2014-06-22 MED ORDER — INSULIN REGULAR HUMAN 100 UNIT/ML IJ SOLN: INTRAMUSCULAR | Status: DC

## 2014-06-22 MED ORDER — AMLODIPINE BESYLATE 5 MG PO TABS: 5.0000 mg | ORAL_TABLET | Freq: Every day | ORAL | Status: DC

## 2014-06-22 MED ORDER — LISINOPRIL 10 MG PO TABS: 10.0000 mg | ORAL_TABLET | Freq: Every day | ORAL | Status: DC

## 2014-06-22 MED ORDER — "INSULIN SYRINGE-NEEDLE U-100 31G X 5/16"" 0.3 ML MISC": Status: DC

## 2014-06-22 NOTE — Patient Instructions (Signed)
General Instructions: -Your blood pressure is well controlled today.  -You Hemoglobin A1C is elevated at 7.4%.  -Avoid eating candy to help lower your blood sugars, this may also help reduce the tingling sensation in your arms.  -Ask your [facility] care providers about the tetanus vaccine. We would like to keep records of your vaccines.  -Follow up with me in 3 months or sooner as needed.    Treatment Goals:  Goals (1 Years of Data) as of 06/22/14         As of Today 03/11/14 03/09/14 03/02/14 02/09/14     Blood Pressure    . Blood Pressure < 140/90  132/77 152/80 148/68 150/79 138/85     Result Component    . HEMOGLOBIN A1C < 7.0  7.4    6.5    . LDL CALC < 100      89      Progress Toward Treatment Goals:  Treatment Goal 06/22/2014  Hemoglobin A1C deteriorated  Blood pressure at goal    Self Care Goals & Plans:  Self Care Goal 06/22/2014  Manage my medications take my medicines as prescribed; bring my medications to every visit; refill my medications on time  Monitor my health keep track of my blood glucose; bring my glucose meter and log to each visit; keep track of my blood pressure; bring my blood pressure log to each visit  Eat healthy foods drink diet soda or water instead of juice or soda  Be physically active find an activity I enjoy    Home Blood Glucose Monitoring 06/22/2014  Check my blood sugar 2 times a day  When to check my blood sugar before breakfast; at bedtime     Care Management & Community Referrals:  Referral 06/22/2014  Referrals made for care management support none needed  Referrals made to community resources -

## 2014-06-23 DIAGNOSIS — R202 Paresthesia of skin: Secondary | ICD-10-CM | POA: Insufficient documentation

## 2014-06-23 NOTE — Assessment & Plan Note (Signed)
She reports 2 weeks of tingling in her bilateral arms with intact sensation and strength. Mostly likely etiology is peripheral neuropathy 2/2 to her now uncontrolled DM2 with HgbA1C of 7.4% and reported daily consumption of candy. She agrees to stop eating candy and will follow up with [place] if the tingling persists--she then may benefit from gabapentin but this will have to be adjusted for her renal function.

## 2014-06-23 NOTE — Assessment & Plan Note (Signed)
Lab Results  Component Value Date   HGBA1C 7.4 06/22/2014   HGBA1C 6.5 02/09/2014   HGBA1C 6.3 11/10/2013     Assessment:  Diabetes control: fair control  Progress toward A1C goal: deteriorated Comments: Continues to be followed at [facility] with no medication changes. Does not have her CBG meter with her today but reports CBGs in the up 100s to 200s with no hypoglycemic. She has gained weight and reports eating candy everyday. He Hg is 7.4% which is the highest it has been in almost 2 years.  Plan:  Medications: continue current medications  Home glucose monitoring:  Frequency: 3 times a day  Timing: before meals  Instruction/counseling given: reminded to bring blood glucose meter & log to each visit, discussed foot care and discussed diet  Educational resources provided: brochure  Self management tools provided: home glucose logbook  Other plans: Patient reminded to bring log book for CBG meter. Will continue following with [facility]. Refilled her Novolin R and syringes.

## 2014-06-23 NOTE — Assessment & Plan Note (Signed)
She has mammogram scheduled for October.  She will obtain colonoscopy report from [facility] when she goes there to visit--we also faxed new release of information form.  -She is certain she has had Tdap and will ask her [facility] transplant team to send [place] records.

## 2014-06-23 NOTE — Assessment & Plan Note (Signed)
She will f/u with [facility] for authorization for treatment.

## 2014-06-23 NOTE — Progress Notes (Signed)
   Subjective:    Patient ID: [name], female    DOB: [birth year], 68 y.o.   MRN: [id number]  Diabetes Pertinent negatives for hypoglycemia include no dizziness. Pertinent negatives for diabetes include no chest pain.   [name] is a 68 y old woman with PMH of HIV, ESRD s/p kidney transplant in 10/13, Hep C, HTN, DM2, who presents for follow up for her diabetes. In addition she complains of "pins and needles"/tingling sensation in her bilateral arms for the past 2 weeks. She reports compliance with her insulin but needs refills for her Novolin R. She has gained weight, reports increased appetite and increased consumption of candy. She reports that she actually lost weight since she has seen me after her lasix dose was increased to 40mg  daily (from 20mg  daily) with increased diuresis-her bilateral leg edema has also improved. She feels tired during the day and takes a nap after lunch. She is concerned this could be a side effect of her hydralazine.     Review of Systems  Constitutional: Negative for fever.  Respiratory: Negative for cough and shortness of breath.   Cardiovascular: Positive for leg swelling. Negative for chest pain.  Gastrointestinal: Negative for abdominal pain.  Genitourinary: Negative for frequency.  Neurological: Negative for dizziness and light-headedness.       Objective:   Physical Exam  Nursing note and vitals reviewed. Constitutional: She is oriented to person, place, and time. She appears well-developed and well-nourished. No distress.  Cardiovascular: Normal rate and regular rhythm.   Pulmonary/Chest: Effort normal. No respiratory distress. She has no wheezes.  Abdominal: Soft. She exhibits no distension.  Musculoskeletal: She exhibits edema. She exhibits no tenderness.  Bilateral grip strength 5/5.  1+ pitting edema bilaterally up to her knees.   Neurological: She is alert and oriented to person, place, and time.  Sensation intact in bilateral UE    Skin: Skin is warm and dry. She is not diaphoretic.  Psychiatric: She has a normal mood and affect.          Assessment & Plan:

## 2014-06-23 NOTE — Assessment & Plan Note (Signed)
She reports that her LE edema has significantly improved with the increased dose of Lasix to 40mg  daily from 20mg  daily.

## 2014-06-23 NOTE — Assessment & Plan Note (Signed)
BP Readings from Last 3 Encounters:  06/22/14 132/77  03/11/14 152/80  03/09/14 148/68    Lab Results  Component Value Date   NA 141 03/02/2014   K 3.5 03/02/2014   CREATININE 1.47* 03/02/2014    Assessment: Blood pressure control: controlled Progress toward BP goal:  at goal Comments: She is followed by [facility] team for this problem. She is now on Norvasc 5mg  daily. Coreg 6.25mg  BID, Lasix 40mg  daily, and hydralazine 25mg  TID. Her BP is well controlled. She reports feeling tired since starting this new anti-hypertensive regimen. She will follow up with [facility] for possible medication adjustment although she understands that her recent higher CBGs may be causing her drowsiness in the afternoons.   Plan: Medications:  continue current medications Educational resources provided: brochure Self management tools provided:   Other plans: She will follow up with [facility] on July 28th.

## 2014-06-24 NOTE — Progress Notes (Signed)
Case discussed with Dr. Kennerly soon after the resident saw the patient.  We reviewed the resident's history and exam and pertinent patient test results.  I agree with the assessment, diagnosis, and plan of care documented in the resident's note. 

## 2014-07-20 ENCOUNTER — Other Ambulatory Visit: Payer: Self-pay | Admitting: *Deleted

## 2014-07-20 DIAGNOSIS — I10 Essential (primary) hypertension: Secondary | ICD-10-CM

## 2014-07-20 MED ORDER — HYDRALAZINE HCL 25 MG PO TABS: 25.0000 mg | ORAL_TABLET | Freq: Three times a day (TID) | ORAL | Status: DC

## 2014-07-20 MED ORDER — AMLODIPINE BESYLATE 5 MG PO TABS: 5.0000 mg | ORAL_TABLET | Freq: Every day | ORAL | Status: DC

## 2014-07-20 MED ORDER — CARVEDILOL 6.25 MG PO TABS: 6.2500 mg | ORAL_TABLET | Freq: Two times a day (BID) | ORAL | Status: DC

## 2014-07-20 MED ORDER — OMEPRAZOLE 20 MG PO CPDR: 20.0000 mg | DELAYED_RELEASE_CAPSULE | Freq: Every day | ORAL | Status: DC

## 2014-07-20 NOTE — Telephone Encounter (Signed)
Pt will be in [place] for 1 month, she states pharmacy in [place] told her she would need new scripts sent

## 2014-07-21 NOTE — Telephone Encounter (Signed)
Called to pharm 

## 2014-09-16 ENCOUNTER — Other Ambulatory Visit (HOSPITAL_COMMUNITY)
Admission: RE | Admit: 2014-09-16 | Discharge: 2014-09-16 | Disposition: A | Discharge status: Home / Self Care | Payer: Medicare Other | Source: Ambulatory Visit | Attending: Infectious Disease | Admitting: Infectious Disease

## 2014-09-16 ENCOUNTER — Other Ambulatory Visit: Payer: Medicare Other

## 2014-09-16 DIAGNOSIS — Z113 Encounter for screening for infections with a predominantly sexual mode of transmission: Secondary | ICD-10-CM

## 2014-09-16 DIAGNOSIS — B20 Human immunodeficiency virus [HIV] disease: Secondary | ICD-10-CM

## 2014-09-16 DIAGNOSIS — E785 Hyperlipidemia, unspecified: Secondary | ICD-10-CM

## 2014-09-16 LAB — CBC WITH DIFFERENTIAL/PLATELET
Basophils Absolute: 0 10*3/uL (ref 0.0–0.1)
Basophils Relative: 0 % (ref 0–1)
EOS PCT: 5 % (ref 0–5)
Eosinophils Absolute: 0.4 10*3/uL (ref 0.0–0.7)
HEMATOCRIT: 35.7 % — AB (ref 36.0–46.0)
HEMOGLOBIN: 11.9 g/dL — AB (ref 12.0–15.0)
LYMPHS ABS: 1.5 10*3/uL (ref 0.7–4.0)
LYMPHS PCT: 18 % (ref 12–46)
MCH: 29.2 pg (ref 26.0–34.0)
MCHC: 33.3 g/dL (ref 30.0–36.0)
MCV: 87.7 fL (ref 78.0–100.0)
MONO ABS: 0.6 10*3/uL (ref 0.1–1.0)
MONOS PCT: 7 % (ref 3–12)
NEUTROS ABS: 6 10*3/uL (ref 1.7–7.7)
Neutrophils Relative %: 70 % (ref 43–77)
Platelets: 193 10*3/uL (ref 150–400)
RBC: 4.07 MIL/uL (ref 3.87–5.11)
RDW: 14.8 % (ref 11.5–15.5)
WBC: 8.6 10*3/uL (ref 4.0–10.5)

## 2014-09-16 LAB — COMPLETE METABOLIC PANEL WITH GFR
ALBUMIN: 3.4 g/dL — AB (ref 3.5–5.2)
ALT: 11 U/L (ref 0–35)
AST: 13 U/L (ref 0–37)
Alkaline Phosphatase: 97 U/L (ref 39–117)
BUN: 28 mg/dL — AB (ref 6–23)
CALCIUM: 8.5 mg/dL (ref 8.4–10.5)
CHLORIDE: 102 meq/L (ref 96–112)
CO2: 25 meq/L (ref 19–32)
CREATININE: 1.9 mg/dL — AB (ref 0.50–1.10)
GFR, Est African American: 31 mL/min — ABNORMAL LOW
GFR, Est Non African American: 27 mL/min — ABNORMAL LOW
Glucose, Bld: 215 mg/dL — ABNORMAL HIGH (ref 70–99)
Potassium: 4 mEq/L (ref 3.5–5.3)
Sodium: 139 mEq/L (ref 135–145)
Total Bilirubin: 0.4 mg/dL (ref 0.2–1.2)
Total Protein: 6.8 g/dL (ref 6.0–8.3)

## 2014-09-16 LAB — LIPID PANEL
Cholesterol: 272 mg/dL — ABNORMAL HIGH (ref 0–200)
HDL: 67 mg/dL (ref >39)
LDL Cholesterol: 171 mg/dL — ABNORMAL HIGH (ref 0–99)
TRIGLYCERIDES: 170 mg/dL — AB (ref <150)
Total CHOL/HDL Ratio: 4.1 Ratio
VLDL: 34 mg/dL (ref 0–40)

## 2014-09-17 LAB — URINE CYTOLOGY ANCILLARY ONLY
CHLAMYDIA, DNA PROBE: NEGATIVE
Neisseria Gonorrhea: NEGATIVE

## 2014-09-17 LAB — RPR TITER

## 2014-09-17 LAB — HIV-1 RNA QUANT-NO REFLEX-BLD: HIV 1 RNA Quant: <20 copies/mL (ref <20)

## 2014-09-17 LAB — RPR: RPR Ser Ql: REACTIVE — AB

## 2014-09-17 LAB — FLUORESCENT TREPONEMAL AB(FTA)-IGG-BLD: Fluorescent Treponemal ABS: REACTIVE — AB

## 2014-09-17 LAB — T-HELPER CELL (CD4) - (RCID CLINIC ONLY)
CD4 % Helper T Cell: 17 % — ABNORMAL LOW (ref 33–55)
CD4 T Cell Abs: 260 /uL — ABNORMAL LOW (ref 400–2700)

## 2014-09-17 LAB — MICROALBUMIN / CREATININE URINE RATIO
CREATININE, URINE: 235.7 mg/dL
MICROALB/CREAT RATIO: 596.9 mg/g — AB (ref 0.0–30.0)
Microalb, Ur: 140.7 mg/dL — ABNORMAL HIGH (ref <2.0)

## 2014-09-28 ENCOUNTER — Ambulatory Visit: Payer: Medicare Other | Admitting: Infectious Disease

## 2014-09-30 ENCOUNTER — Ambulatory Visit: Payer: Medicare Other | Admitting: Infectious Disease

## 2014-10-02 ENCOUNTER — Ambulatory Visit (INDEPENDENT_AMBULATORY_CARE_PROVIDER_SITE_OTHER): Payer: Medicare Other | Admitting: Infectious Disease

## 2014-10-02 ENCOUNTER — Encounter: Payer: Self-pay | Admitting: Infectious Disease

## 2014-10-02 VITALS — BP 153/78 | HR 71 | Temp 98.3°F | Ht 63.0 in | Wt 167.0 lb

## 2014-10-02 DIAGNOSIS — E1121 Type 2 diabetes mellitus with diabetic nephropathy: Secondary | ICD-10-CM

## 2014-10-02 DIAGNOSIS — E785 Hyperlipidemia, unspecified: Secondary | ICD-10-CM

## 2014-10-02 DIAGNOSIS — B20 Human immunodeficiency virus [HIV] disease: Secondary | ICD-10-CM

## 2014-10-02 DIAGNOSIS — K219 Gastro-esophageal reflux disease without esophagitis: Secondary | ICD-10-CM

## 2014-10-02 DIAGNOSIS — Z113 Encounter for screening for infections with a predominantly sexual mode of transmission: Secondary | ICD-10-CM

## 2014-10-02 DIAGNOSIS — Z940 Kidney transplant status: Secondary | ICD-10-CM

## 2014-10-02 DIAGNOSIS — I10 Essential (primary) hypertension: Secondary | ICD-10-CM

## 2014-10-02 DIAGNOSIS — B182 Chronic viral hepatitis C: Secondary | ICD-10-CM

## 2014-10-02 NOTE — Progress Notes (Signed)
Subjective:    Patient ID: [name], female    DOB: 1946/05/15, 68 y.o.   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 She has been currently with perfect suppression on Sustiva and Epzicom then changed to [facility].  She was given rx for [place] and is on her last bottle of Harvoni.  Unfortunately she has not been taking the Prilosec correctly has been taking the Prilosec in the evening on an empty stomach but 12 hours apart from her hormone he does have asked her to take to her bony at the same time as her Prilosec dose on an empty stomach and she was whichever doing this tomorrow morning. She will followup with gastroenterology at [facility] and will need SVR 12 checked.  Serum renin was up here with [place] to 1.9 1.8 at [facility] when checked a few days ago     Review of Systems  Constitutional: Negative for fever, chills, diaphoresis, activity change, appetite change, fatigue and unexpected weight change.  HENT: Negative for congestion, rhinorrhea, sinus pressure, sneezing, sore throat and trouble swallowing.   Eyes: Negative for photophobia and visual disturbance.  Respiratory: Negative for cough, chest tightness, shortness of breath, wheezing and stridor.   Cardiovascular: Negative for chest pain, palpitations and leg swelling.  Gastrointestinal: Negative for nausea, vomiting, abdominal pain, diarrhea, constipation, blood in stool, abdominal distention and anal bleeding.  Genitourinary: Negative for dysuria, hematuria, flank pain and difficulty urinating.  Musculoskeletal: Negative for back pain, gait problem, joint swelling and myalgias.  Skin: Negative for color change, pallor, rash and wound.  Neurological: Negative for dizziness, tremors, weakness and light-headedness.  Hematological: Negative for adenopathy. Does not bruise/bleed  easily.  Psychiatric/Behavioral: Negative for behavioral problems, confusion, sleep disturbance, dysphoric mood, decreased concentration and agitation.       Objective:   Physical Exam  Constitutional: She is oriented to person, place, and time. She appears well-developed and well-nourished. No distress.  HENT:  Head: Normocephalic and atraumatic.  Mouth/Throat: Oropharynx is clear and moist. No oropharyngeal exudate.  Eyes: Conjunctivae and EOM are normal. Pupils are equal, round, and reactive to light. No scleral icterus.  Neck: Normal range of motion. Neck supple. No JVD present.  Cardiovascular: Normal rate, regular rhythm and normal heart sounds.  Exam reveals no gallop and no friction rub.   No murmur heard. Pulmonary/Chest: Effort normal and breath sounds normal. No respiratory distress. She has no wheezes. She has no rales. She exhibits no tenderness.  Abdominal: She exhibits no distension and no mass. There is no tenderness. There is no rebound and no guarding.  Musculoskeletal: She exhibits no edema and no tenderness.  Lymphadenopathy:    She has no cervical adenopathy.  Neurological: She is alert and oriented to person, place, and time. Coordination normal.  Skin: Skin is warm and dry. She is not diaphoretic. No erythema. No pallor.  Psychiatric: She has a normal mood and affect. Her behavior is normal. Judgment and thought content normal.          Assessment & Plan:  HIV:   continue Triumeq ONCE again for  dosing simplicity and one pill once a day though may need to split components given her GFR trending down   Hepatitis C: genotype 1b,: On harvoni needs to not take PPI, prilosec apart from her Harvoni but at the same  time on empty stomach we'll switch over to March of this regimen. I spent greater than 25 minutes with the patient including greater than 50% of time in face to face counsel of the patient and in coordination of their care.   Renal transplantation: being  followed VERY closely at [facility], cr up slightly   DM: being followed by Endocrine, and Dr Hayes Ludwig here in [facility] at [facility].

## 2014-10-02 NOTE — Patient Instructions (Signed)
CHANGE TO TAKING  PRILOSEC 20MG  (NO HIGHER)  PLUS  HARVONI TABLET IN THE AM WITHOUT FOOD   YOU CAN ALSO TAKE TRIUMEQ AT THAT TIME

## 2014-10-12 ENCOUNTER — Encounter: Payer: Medicare Other | Admitting: Internal Medicine

## 2014-11-09 ENCOUNTER — Ambulatory Visit (INDEPENDENT_AMBULATORY_CARE_PROVIDER_SITE_OTHER): Payer: Medicare Other | Admitting: Internal Medicine

## 2014-11-09 ENCOUNTER — Encounter: Payer: Self-pay | Admitting: Internal Medicine

## 2014-11-09 VITALS — BP 169/71 | HR 57 | Temp 97.9°F | Wt 169.9 lb

## 2014-11-09 DIAGNOSIS — I10 Essential (primary) hypertension: Secondary | ICD-10-CM

## 2014-11-09 DIAGNOSIS — K219 Gastro-esophageal reflux disease without esophagitis: Secondary | ICD-10-CM

## 2014-11-09 DIAGNOSIS — H109 Unspecified conjunctivitis: Secondary | ICD-10-CM | POA: Insufficient documentation

## 2014-11-09 DIAGNOSIS — Z0000 Encounter for general adult medical examination without abnormal findings: Secondary | ICD-10-CM

## 2014-11-09 DIAGNOSIS — B182 Chronic viral hepatitis C: Secondary | ICD-10-CM

## 2014-11-09 DIAGNOSIS — E119 Type 2 diabetes mellitus without complications: Secondary | ICD-10-CM

## 2014-11-09 DIAGNOSIS — E785 Hyperlipidemia, unspecified: Secondary | ICD-10-CM

## 2014-11-09 DIAGNOSIS — E1121 Type 2 diabetes mellitus with diabetic nephropathy: Secondary | ICD-10-CM

## 2014-11-09 LAB — POCT GLYCOSYLATED HEMOGLOBIN (HGB A1C): Hemoglobin A1C: 6.5

## 2014-11-09 LAB — GLUCOSE, CAPILLARY: Glucose-Capillary: 227 mg/dL — ABNORMAL HIGH (ref 70–99)

## 2014-11-09 MED ORDER — ROSUVASTATIN CALCIUM 20 MG PO TABS
20.0000 mg | ORAL_TABLET | Freq: Every day | ORAL | Status: DC
Start: 2014-11-09 — End: 2015-07-06

## 2014-11-09 MED ORDER — LOSARTAN POTASSIUM 25 MG PO TABS: 25.0000 mg | ORAL_TABLET | Freq: Every day | ORAL | Status: DC

## 2014-11-09 MED ORDER — OMEPRAZOLE 20 MG PO CPDR: 20.0000 mg | DELAYED_RELEASE_CAPSULE | Freq: Every day | ORAL | Status: DC

## 2014-11-09 MED ORDER — INSULIN NPH ISOPHANE & REGULAR (70-30) 100 UNIT/ML ~~LOC~~ SUSP: SUBCUTANEOUS | Status: DC

## 2014-11-09 MED ORDER — CARVEDILOL 6.25 MG PO TABS: 6.2500 mg | ORAL_TABLET | Freq: Two times a day (BID) | ORAL | Status: DC

## 2014-11-09 NOTE — Patient Instructions (Signed)
General Instructions: -You may use artificial tears to reduce the itching feeling in your eye: Systane, Clear eyes, Refresh, are some of the common brands.  -You have been referred to Opthalmology for your diabetic eye exam and for further evaluation of the red eye.  -Continue taking the insulin as you are, you may reduce it by half if your blood sugar is below 80.  -Continue checking your blood pressure at home and give [place] a call if is trending up, higher than 140/90.  -Please bring [place] the records for the colonoscopy and for the mammogram at your earliest convenience.  -Follow up with [place] in 3 months.    Thank you for bringing your medicines today. This helps [place] keep you safe from mistakes.   Progress Toward Treatment Goals:  Treatment Goal 11/09/2014  Hemoglobin A1C at goal  Blood pressure deteriorated    Self Care Goals & Plans:  Self Care Goal 11/09/2014  Manage my medications take my medicines as prescribed; bring my medications to every visit; refill my medications on time  Monitor my health keep track of my blood pressure; keep track of my weight; check my feet daily; keep track of my blood glucose; bring my glucose meter and log to each visit  Eat healthy foods eat foods that are low in salt; eat baked foods instead of fried foods; drink diet soda or water instead of juice or soda  Be physically active find an activity I enjoy    Home Blood Glucose Monitoring 11/09/2014  Check my blood sugar 2 times a day  When to check my blood sugar before meals     Care Management & Community Referrals:  Referral 11/09/2014  Referrals made for care management support none needed  Referrals made to community resources -

## 2014-11-09 NOTE — Progress Notes (Signed)
   Subjective:    Patient ID: [name], female    DOB: [birth year], 68 y.o.   MRN: [id number]  HPI [name] is a 68 y old woman with PMH of HIV, ESRD s/p kidney transplant in 10/13, Hep C (just completed Harvoni tx at [facility]), HTN, DM2, who presents for follow up for her diabetes. In addition she complains of conjunctivitis of the left eye that has been present for 66 days. It started as an itch that she gently scratched. She denies left eye pain, changes in vision, thick yellow discharge, trauma to the eye, or contact with people with eye infections.    Review of Systems  Constitutional: Negative for fever, chills, diaphoresis, activity change, appetite change and fatigue.  Eyes: Positive for redness.  Respiratory: Negative for cough and shortness of breath.   Cardiovascular: Negative for chest pain and leg swelling.  Gastrointestinal: Negative for abdominal pain.  Genitourinary: Negative for dysuria.  Skin: Negative for rash.  Neurological: Negative for dizziness and light-headedness.  Psychiatric/Behavioral: Negative for agitation.       Objective:   Physical Exam  Constitutional: She is oriented to person, place, and time. She appears well-developed and well-nourished. No distress.  Eyes: EOM are normal. Pupils are equal, round, and reactive to light. Right eye exhibits no discharge. Left eye exhibits no discharge.  Left conjunctiva injected with prominent small blood vessels, no signs of trauma, no foreign object  Cardiovascular: Normal rate and regular rhythm.   Pulmonary/Chest: Effort normal and breath sounds normal. No respiratory distress. She has no wheezes. She has no rales.  Abdominal: Soft. There is no tenderness.  Musculoskeletal: She exhibits no edema or tenderness.  Neurological: She is alert and oriented to person, place, and time. Coordination normal.  Skin: Skin is warm and dry. No rash noted. She is not diaphoretic.  Psychiatric: She has a normal mood and  affect.  Nursing note and vitals reviewed.         Assessment & Plan:

## 2014-11-09 NOTE — Assessment & Plan Note (Signed)
Mammogram done last month, normal, she will bring the records.  She will bring the colonoscopy report from [facility]

## 2014-11-09 NOTE — Assessment & Plan Note (Signed)
Finished Harvoni on 11/22, will have follow up visit this month for labs.

## 2014-11-10 NOTE — Assessment & Plan Note (Deleted)
Finished Harvoni on 10/25/14. Has follow up appointment this month.

## 2014-11-10 NOTE — Assessment & Plan Note (Addendum)
Lab Results  Component Value Date   HGBA1C 6.5 11/09/2014   HGBA1C 7.4 06/22/2014   HGBA1C 6.5 02/09/2014     Assessment: Diabetes control: good control (HgbA1C at goal) Progress toward A1C goal:  at goal Comments: Followed by [facility]. Now on Novolin70/30 18 u before breakfast and 16 u before dinner. CBG meter download reviewed. No hypoglycemia. Blood glucose before dinner mostly 140mg /dL.  Plan: Medications:  continue current medications Home glucose monitoring: Frequency: 2 times a day Timing: before meals Instruction/counseling given: reminded to get eye exam, reminded to bring medications to each visit and discussed diet, discussed weight loss Educational resources provided: brochure Self management tools provided: copy of home glucose meter download, home glucose logbook Other plans: Follow up in 3 months.

## 2014-11-10 NOTE — Assessment & Plan Note (Signed)
Of the left eye. No eye pain, decrease in vision, signs of trauma, pupil is responsive to light. Acute narrow angle glaucoma is less likely. Corneal abrasion possible given that she "scratched" her eye but this could not be fully assessed during this visit.   Referred to Ophthalmology--she also needs dilated DM2 eye exam

## 2014-11-10 NOTE — Assessment & Plan Note (Signed)
Refilled her Prilosec

## 2014-11-10 NOTE — Assessment & Plan Note (Signed)
Refilled her crestor

## 2014-11-10 NOTE — Assessment & Plan Note (Signed)
BP Readings from Last 3 Encounters:  11/09/14 169/71  10/02/14 153/78  06/22/14 132/77    Lab Results  Component Value Date   NA 139 09/16/2014   K 4.0 09/16/2014   CREATININE 1.90* 09/16/2014    Assessment: Blood pressure control: mildly elevated Progress toward BP goal:  deteriorated Comments: She is on Norvasc 5mg  daily, Coreg 6.25mg  BID, hydralazine 25mg  TID, losartan 25mg  daily. She has blood pressure cuff at home and reports that her reads are rarely >140/80.    Plan: Medications:  continue current medications, refilled her Coreg, Norvasc, and Losartan Educational resources provided: brochure Self management tools provided:   Other plans: Follow up in 3 months

## 2014-11-11 NOTE — Progress Notes (Signed)
INTERNAL MEDICINE TEACHING ATTENDING ADDENDUM - Aiven Kampe, MD: I reviewed and discussed at the time of visit with the resident Dr. Kennerly, the patient's medical history, physical examination, diagnosis and results of pertinent tests and treatment and I agree with the patient's care as documented.  

## 2014-12-15 ENCOUNTER — Other Ambulatory Visit: Payer: Self-pay | Admitting: *Deleted

## 2014-12-15 MED ORDER — HYDRALAZINE HCL 25 MG PO TABS: 25.0000 mg | ORAL_TABLET | Freq: Three times a day (TID) | ORAL | Status: DC

## 2014-12-15 MED ORDER — CINACALCET HCL 60 MG PO TABS: 60.0000 mg | ORAL_TABLET | Freq: Every day | ORAL | Status: DC

## 2014-12-22 DIAGNOSIS — B182 Chronic viral hepatitis C: Secondary | ICD-10-CM | POA: Diagnosis not present

## 2014-12-29 DIAGNOSIS — H2513 Age-related nuclear cataract, bilateral: Secondary | ICD-10-CM | POA: Diagnosis not present

## 2014-12-29 DIAGNOSIS — E119 Type 2 diabetes mellitus without complications: Secondary | ICD-10-CM | POA: Diagnosis not present

## 2014-12-29 DIAGNOSIS — H25013 Cortical age-related cataract, bilateral: Secondary | ICD-10-CM | POA: Diagnosis not present

## 2014-12-29 LAB — HM DIABETES EYE EXAM

## 2014-12-31 LAB — HM DIABETES EYE EXAM

## 2015-01-04 DIAGNOSIS — E118 Type 2 diabetes mellitus with unspecified complications: Secondary | ICD-10-CM | POA: Diagnosis not present

## 2015-01-04 DIAGNOSIS — Z940 Kidney transplant status: Secondary | ICD-10-CM | POA: Diagnosis not present

## 2015-01-04 DIAGNOSIS — N183 Chronic kidney disease, stage 3 (moderate): Secondary | ICD-10-CM | POA: Diagnosis not present

## 2015-01-04 DIAGNOSIS — N2581 Secondary hyperparathyroidism of renal origin: Secondary | ICD-10-CM | POA: Diagnosis not present

## 2015-01-04 DIAGNOSIS — I129 Hypertensive chronic kidney disease with stage 1 through stage 4 chronic kidney disease, or unspecified chronic kidney disease: Secondary | ICD-10-CM | POA: Diagnosis not present

## 2015-01-04 DIAGNOSIS — E782 Mixed hyperlipidemia: Secondary | ICD-10-CM | POA: Diagnosis not present

## 2015-01-13 DIAGNOSIS — Z940 Kidney transplant status: Secondary | ICD-10-CM | POA: Diagnosis not present

## 2015-01-27 DIAGNOSIS — Z794 Long term (current) use of insulin: Secondary | ICD-10-CM | POA: Diagnosis not present

## 2015-01-27 DIAGNOSIS — Z79899 Other long term (current) drug therapy: Secondary | ICD-10-CM | POA: Diagnosis not present

## 2015-01-27 DIAGNOSIS — Z940 Kidney transplant status: Secondary | ICD-10-CM | POA: Diagnosis not present

## 2015-01-27 DIAGNOSIS — E785 Hyperlipidemia, unspecified: Secondary | ICD-10-CM | POA: Diagnosis not present

## 2015-01-27 DIAGNOSIS — R7309 Other abnormal glucose: Secondary | ICD-10-CM | POA: Diagnosis not present

## 2015-01-27 DIAGNOSIS — E1121 Type 2 diabetes mellitus with diabetic nephropathy: Secondary | ICD-10-CM | POA: Diagnosis not present

## 2015-01-27 DIAGNOSIS — Z21 Asymptomatic human immunodeficiency virus [HIV] infection status: Secondary | ICD-10-CM | POA: Diagnosis not present

## 2015-01-27 DIAGNOSIS — Z5181 Encounter for therapeutic drug level monitoring: Secondary | ICD-10-CM | POA: Diagnosis not present

## 2015-01-27 DIAGNOSIS — Z792 Long term (current) use of antibiotics: Secondary | ICD-10-CM | POA: Diagnosis not present

## 2015-01-27 DIAGNOSIS — E780 Pure hypercholesterolemia: Secondary | ICD-10-CM | POA: Diagnosis not present

## 2015-01-27 DIAGNOSIS — E1165 Type 2 diabetes mellitus with hyperglycemia: Secondary | ICD-10-CM | POA: Diagnosis not present

## 2015-01-27 DIAGNOSIS — I10 Essential (primary) hypertension: Secondary | ICD-10-CM | POA: Diagnosis not present

## 2015-01-27 DIAGNOSIS — B182 Chronic viral hepatitis C: Secondary | ICD-10-CM | POA: Diagnosis not present

## 2015-01-27 DIAGNOSIS — Z4822 Encounter for aftercare following kidney transplant: Secondary | ICD-10-CM | POA: Diagnosis not present

## 2015-02-01 DIAGNOSIS — Z940 Kidney transplant status: Secondary | ICD-10-CM | POA: Diagnosis not present

## 2015-02-01 DIAGNOSIS — N390 Urinary tract infection, site not specified: Secondary | ICD-10-CM | POA: Diagnosis not present

## 2015-02-03 ENCOUNTER — Other Ambulatory Visit: Payer: Self-pay | Admitting: *Deleted

## 2015-02-03 DIAGNOSIS — Z940 Kidney transplant status: Secondary | ICD-10-CM | POA: Diagnosis not present

## 2015-02-03 DIAGNOSIS — B259 Cytomegaloviral disease, unspecified: Secondary | ICD-10-CM | POA: Diagnosis not present

## 2015-02-03 DIAGNOSIS — Z5181 Encounter for therapeutic drug level monitoring: Secondary | ICD-10-CM | POA: Diagnosis not present

## 2015-02-03 DIAGNOSIS — B349 Viral infection, unspecified: Secondary | ICD-10-CM | POA: Diagnosis not present

## 2015-02-03 DIAGNOSIS — B20 Human immunodeficiency virus [HIV] disease: Secondary | ICD-10-CM

## 2015-02-03 MED ORDER — ABACAVIR-DOLUTEGRAVIR-LAMIVUD 600-50-300 MG PO TABS: 1.0000 | ORAL_TABLET | Freq: Every day | ORAL | Status: DC

## 2015-03-01 ENCOUNTER — Encounter: Payer: Self-pay | Admitting: Internal Medicine

## 2015-03-01 ENCOUNTER — Ambulatory Visit (INDEPENDENT_AMBULATORY_CARE_PROVIDER_SITE_OTHER): Payer: Medicare Other | Admitting: Internal Medicine

## 2015-03-01 VITALS — BP 167/77 | HR 63 | Temp 97.9°F | Wt 174.9 lb

## 2015-03-01 DIAGNOSIS — H00013 Hordeolum externum right eye, unspecified eyelid: Secondary | ICD-10-CM

## 2015-03-01 DIAGNOSIS — Z23 Encounter for immunization: Secondary | ICD-10-CM | POA: Diagnosis not present

## 2015-03-01 DIAGNOSIS — L298 Other pruritus: Secondary | ICD-10-CM | POA: Diagnosis not present

## 2015-03-01 DIAGNOSIS — H00019 Hordeolum externum unspecified eye, unspecified eyelid: Secondary | ICD-10-CM | POA: Insufficient documentation

## 2015-03-01 DIAGNOSIS — E1121 Type 2 diabetes mellitus with diabetic nephropathy: Secondary | ICD-10-CM

## 2015-03-01 DIAGNOSIS — I10 Essential (primary) hypertension: Secondary | ICD-10-CM | POA: Diagnosis not present

## 2015-03-01 DIAGNOSIS — N898 Other specified noninflammatory disorders of vagina: Secondary | ICD-10-CM

## 2015-03-01 LAB — GLUCOSE, CAPILLARY: Glucose-Capillary: 116 mg/dL — ABNORMAL HIGH (ref 70–99)

## 2015-03-01 LAB — POCT GLYCOSYLATED HEMOGLOBIN (HGB A1C): HEMOGLOBIN A1C: 6.9

## 2015-03-01 MED ORDER — INSULIN NPH ISOPHANE & REGULAR (70-30) 100 UNIT/ML ~~LOC~~ SUSP: SUBCUTANEOUS | Status: DC

## 2015-03-01 MED ORDER — FLUCONAZOLE 150 MG PO TABS: ORAL_TABLET | ORAL | Status: DC

## 2015-03-01 MED ORDER — "INSULIN SYRINGE-NEEDLE U-100 31G X 5/16"" 0.3 ML MISC": Status: DC

## 2015-03-01 NOTE — Assessment & Plan Note (Signed)
Small, in right lower eyelid with no signs of infection.  Conservative tx for now with warm compresses, avoidance of scratching the area.

## 2015-03-01 NOTE — Assessment & Plan Note (Addendum)
-  Will obtain records for eye exam, mammogram, and colonoscopy (this was done in [place]).  -Declines Dexa scan at this time.

## 2015-03-01 NOTE — Assessment & Plan Note (Signed)
BP Readings from Last 3 Encounters:  03/01/15 167/77  11/09/14 169/71  10/02/14 153/78    Lab Results  Component Value Date   NA 139 09/16/2014   K 4.0 09/16/2014   CREATININE 1.90* 09/16/2014    Assessment: Blood pressure control: mildly elevated Progress toward BP goal:  deteriorated Comments: On hydralazine 25mg  TID, losartan 25mg  daily, Coreg 6.25 BID, Norvasc 5mg  daily, Lasix was recently decreased from 40mg  daily to 20mg  daily by her hepatologist for no apparent reason and she has been retaining fluid in her lower extremity. She continues to have her BP comanaged by her [facility] transplant team and by her Nephrologist (she has an appointment next month)  Plan: Medications:  continue current medications and increase Lasix back to 40mg  daily Educational resources provided:   Self management tools provided:   Other plans: She was advised to use her home blood pressure machine to check her BP every day and return to clinic in 2 weeks if her BP is still above 140/90.

## 2015-03-01 NOTE — Assessment & Plan Note (Signed)
Diflucan 150mg  once today and in 72 hours

## 2015-03-01 NOTE — Progress Notes (Signed)
   Subjective:    Patient ID: [name], female    DOB: December 25, 1945, 69 y.o.   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 She denies vaginal discharge or odor. She continues to take her anti-rejection immunosuppressants.  Her only recent medication change was a decrease in Lasix to 40mg  daily by her hepatologist with no clear reason. She notes that she has been retaining more fluid in her lower legs since this medication change.  She also complains of a small "bump" in her right lower eyelid that as been present for 3-4 days. Denies pain, discharge from the eye with no trauma to the eye. She does not wear eye make up.    Review of Systems  Constitutional: Negative for fever, chills, diaphoresis, activity change, appetite change and fatigue.  Respiratory: Negative for cough and shortness of breath.   Cardiovascular: Negative for chest pain and leg swelling.  Gastrointestinal: Negative for abdominal pain.  Genitourinary: Negative for dysuria, vaginal discharge and vaginal pain.  Musculoskeletal: Negative for back pain.  Skin: Negative for rash.  Neurological: Negative for dizziness, weakness and light-headedness.       Objective:   Physical Exam  Constitutional: She is oriented to person, place, and time. She appears well-developed and well-nourished. No distress.  HENT:  Small sty at the inner right lid with no surrounding edema or erythema, no discharge  Cardiovascular: Normal rate.   Pulmonary/Chest: Effort normal. No respiratory distress.  Musculoskeletal: She exhibits edema.  1+ pitting edema bilaterally up to his knees   Neurological: She is alert and oriented to person, place, and time. Coordination normal.  Skin: Skin is  warm and dry. She is not diaphoretic.  Psychiatric: She has a normal mood and affect.  Nursing note and vitals reviewed.         Assessment & Plan:

## 2015-03-01 NOTE — Patient Instructions (Signed)
. General Instructions: --It was a pleasure seeing you today! -Take Diflucan 150mg  today then another tablet 72 hours later.  -Apply warm compresses to the right eyelid. Avoid scratching it.  -Restart taking Lasix 40mg  daily.  -Make an appointment to see Butch Penny Plyler to discuss your weight loss goals.  -Measure your blood pressure at least once per day after you have rested for 15 minutes. If your blood pressure is persistently over 140/90 please call us to make a follow up appointment. Avoid foods with salt to prevent a rise in blood pressure and swelling of your legs.  -Follow up with [place] in 3 months or sooner as needed.    Thank you for bringing your medicines today. This helps [place] keep you safe from mistakes.   Progress Toward Treatment Goals:  Treatment Goal 03/01/2015  Hemoglobin A1C at goal  Blood pressure deteriorated    Self Care Goals & Plans:  Self Care Goal 11/09/2014  Manage my medications take my medicines as prescribed; bring my medications to every visit; refill my medications on time  Monitor my health keep track of my blood pressure; keep track of my weight; check my feet daily; keep track of my blood glucose; bring my glucose meter and log to each visit  Eat healthy foods eat foods that are low in salt; eat baked foods instead of fried foods; drink diet soda or water instead of juice or soda  Be physically active find an activity I enjoy    Home Blood Glucose Monitoring 11/09/2014  Check my blood sugar 2 times a day  When to check my blood sugar before meals     Care Management & Community Referrals:  Referral 03/01/2015  Referrals made for care management support diabetes educator  Referrals made to community resources -       Sty A sty (hordeolum) is an infection of a gland in the eyelid located at the base of the eyelash. A sty may develop a white or yellow head of pus. It can be puffy (swollen). Usually, the sty will burst and pus will come out on its  own. They do not leave lumps in the eyelid once they drain. A sty is often confused with another form of cyst of the eyelid called a chalazion. Chalazions occur within the eyelid and not on the edge where the bases of the eyelashes are. They often are red, sore and then form firm lumps in the eyelid. CAUSES   Germs (bacteria).  Lasting (chronic) eyelid inflammation. SYMPTOMS   Tenderness, redness and swelling along the edge of the eyelid at the base of the eyelashes.  Sometimes, there is a white or yellow head of pus. It may or may not drain. DIAGNOSIS  An ophthalmologist will be able to distinguish between a sty and a chalazion and treat the condition appropriately.  TREATMENT   Styes are typically treated with warm packs (compresses) until drainage occurs.  In rare cases, medicines that kill germs (antibiotics) may be prescribed. These antibiotics may be in the form of drops, cream or pills.  If a hard lump has formed, it is generally necessary to do a small incision and remove the hardened contents of the cyst in a minor surgical procedure done in the office.  In suspicious cases, your caregiver may send the contents of the cyst to the lab to be certain that it is not a rare, but dangerous form of cancer of the glands of the eyelid. HOME CARE INSTRUCTIONS  Wash your hands often and dry them with a clean towel. Avoid touching your eyelid. This may spread the infection to other parts of the eye.  Apply heat to your eyelid for 10 to 20 minutes, several times a day, to ease pain and help to heal it faster.  Do not squeeze the sty. Allow it to drain on its own. Wash your eyelid carefully 3 to 4 times per day to remove any pus. SEEK IMMEDIATE MEDICAL CARE IF:   Your eye becomes painful or puffy (swollen).  Your vision changes.  Your sty does not drain by itself within 3 days.  Your sty comes back within a short period of time, even with treatment.  You have redness  (inflammation) around the eye.  You have a fever. Document Released: 08/30/2005 Document Revised: 02/12/2012 Document Reviewed: 03/06/2014 [facility] Patient Information 2015 [place], [place]. This information is not intended to replace advice given to you by your health care provider. Make sure you discuss any questions you have with your health care provider.  Candida Infection A Candida infection (also called yeast, fungus, and Monilia infection) is an overgrowth of yeast that can occur anywhere on the body. A yeast infection commonly occurs in warm, moist body areas. Usually, the infection remains localized but can spread to become a systemic infection. A yeast infection may be a sign of a more severe disease such as diabetes, leukemia, or AIDS. A yeast infection can occur in both men and women. In women, Candida vaginitis is a vaginal infection. It is one of the most common causes of vaginitis. Men usually do not have symptoms or know they have an infection until other problems develop. Men may find out they have a yeast infection because their sex partner has a yeast infection. Uncircumcised men are more likely to get a yeast infection than circumcised men. This is because the uncircumcised glans is not exposed to air and does not remain as dry as that of a circumcised glans. Older adults may develop yeast infections around dentures. CAUSES  Women  Antibiotics.  Steroid medication taken for a long time.  Being overweight (obese).  Diabetes.  Poor immune condition.  Certain serious medical conditions.  Immune suppressive medications for organ transplant patients.  Chemotherapy.  Pregnancy.  Menstruation.  Stress and fatigue.  Intravenous drug use.  Oral contraceptives.  Wearing tight-fitting clothes in the crotch area.  Catching it from a sex partner who has a yeast infection.  Spermicide.  Intravenous, urinary, or other catheters. Men  Catching it from a sex partner  who has a yeast infection.  Having oral or anal sex with a person who has the infection.  Spermicide.  Diabetes.  Antibiotics.  Poor immune system.  Medications that suppress the immune system.  Intravenous drug use.  Intravenous, urinary, or other catheters. SYMPTOMS  Women  Thick, white vaginal discharge.  Vaginal itching.  Redness and swelling in and around the vagina.  Irritation of the lips of the vagina and perineum.  Blisters on the vaginal lips and perineum.  Painful sexual intercourse.  Low blood sugar (hypoglycemia).  Painful urination.  Bladder infections.  Intestinal problems such as constipation, indigestion, bad breath, bloating, increase in gas, diarrhea, or loose stools. Men  Men may develop intestinal problems such as constipation, indigestion, bad breath, bloating, increase in gas, diarrhea, or loose stools.  Dry, cracked skin on the penis with itching or discomfort.  Jock itch.  Dry, flaky skin.  Athlete's foot.  Hypoglycemia. DIAGNOSIS  Women  A history and an exam are performed.  The discharge may be examined under a microscope.  A culture may be taken of the discharge. Men  A history and an exam are performed.  Any discharge from the penis or areas of cracked skin will be looked at under the microscope and cultured.  Stool samples may be cultured. TREATMENT  Women  Vaginal antifungal suppositories and creams.  Medicated creams to decrease irritation and itching on the outside of the vagina.  Warm compresses to the perineal area to decrease swelling and discomfort.  Oral antifungal medications.  Medicated vaginal suppositories or cream for repeated or recurrent infections.  Wash and dry the irritation areas before applying the cream.  Eating yogurt with Lactobacillus may help with prevention and treatment.  Sometimes painting the vagina with gentian violet solution may help if creams and suppositories do not  work. Men  Antifungal creams and oral antifungal medications.  Sometimes treatment must continue for 30 days after the symptoms go away to prevent recurrence. HOME CARE INSTRUCTIONS  Women  Use cotton underwear and avoid tight-fitting clothing.  Avoid colored, scented toilet paper and deodorant tampons or pads.  Do not douche.  Keep your diabetes under control.  Finish all the prescribed medications.  Keep your skin clean and dry.  Consume milk or yogurt with Lactobacillus-active culture regularly. If you get frequent yeast infections and think that is what the infection is, there are over-the-counter medications that you can get. If the infection does not show healing in 3 days, talk to your caregiver.  Tell your sex partner you have a yeast infection. Your partner may need treatment also, especially if your infection does not clear up or recurs. Men  Keep your skin clean and dry.  Keep your diabetes under control.  Finish all prescribed medications.  Tell your sex partner that you have a yeast infection so he or she can be treated if necessary. SEEK MEDICAL CARE IF:   Your symptoms do not clear up or worsen in one week after treatment.  You have an oral temperature above 102 F (38.9 C).  You have trouble swallowing or eating for a prolonged time.  You develop blisters on and around your vagina.  You develop vaginal bleeding and it is not your menstrual period.  You develop abdominal pain.  You develop intestinal problems as mentioned above.  You get weak or light-headed.  You have painful or increased urination.  You have pain during sexual intercourse. MAKE SURE YOU:   Understand these instructions.  Will watch your condition.  Will get help right away if you are not doing well or get worse. Document Released: 12/28/2004 Document Revised: 04/06/2014 Document Reviewed: 04/11/2010 [facility] Patient Information 2015 [place], [place]. This information is  not intended to replace advice given to you by your health care provider. Make sure you discuss any questions you have with your health care provider.

## 2015-03-01 NOTE — Assessment & Plan Note (Signed)
Lab Results  Component Value Date   HGBA1C 6.9 03/01/2015   HGBA1C 6.5 11/09/2014   HGBA1C 7.4 06/22/2014     Assessment: Diabetes control: good control (HgbA1C at goal) Progress toward A1C goal:  at goal Comments: On Novolin 70/30 18 un and 16 un, no hypoglycemia, reviewed CBG meter download with CBG consistently <200, lowest read at 70  Plan: Medications:  continue current medications Home glucose monitoring: Frequency:   Timing:   Instruction/counseling given: discussed the need for weight loss and discussed diet Educational resources provided:   Self management tools provided:   Other plans: referred to diabetes educator for weight loss goal. Performed foot exam during this visit.

## 2015-03-02 NOTE — Progress Notes (Signed)
Internal Medicine Clinic Attending Date of visit: 03/01/2015  Case discussed with Dr. Hayes Ludwig soon after the resident saw the patient on the day of the visit .  We reviewed the resident's history and exam and pertinent patient test results.  I agree with the assessment, diagnosis, and plan of care documented in the resident's note.

## 2015-03-15 ENCOUNTER — Ambulatory Visit (INDEPENDENT_AMBULATORY_CARE_PROVIDER_SITE_OTHER): Payer: Medicare Other | Admitting: Dietician

## 2015-03-15 ENCOUNTER — Other Ambulatory Visit: Payer: Medicare Other

## 2015-03-15 ENCOUNTER — Other Ambulatory Visit: Payer: Self-pay | Admitting: Internal Medicine

## 2015-03-15 ENCOUNTER — Other Ambulatory Visit (HOSPITAL_COMMUNITY)
Admission: RE | Admit: 2015-03-15 | Discharge: 2015-03-15 | Disposition: A | Discharge status: Home / Self Care | Payer: Medicare Other | Source: Ambulatory Visit | Attending: Infectious Disease | Admitting: Infectious Disease

## 2015-03-15 ENCOUNTER — Encounter: Payer: Self-pay | Admitting: Dietician

## 2015-03-15 ENCOUNTER — Other Ambulatory Visit: Payer: Self-pay | Admitting: Dietician

## 2015-03-15 VITALS — Ht 62.25 in | Wt 173.5 lb

## 2015-03-15 DIAGNOSIS — E1121 Type 2 diabetes mellitus with diabetic nephropathy: Secondary | ICD-10-CM

## 2015-03-15 DIAGNOSIS — B20 Human immunodeficiency virus [HIV] disease: Secondary | ICD-10-CM

## 2015-03-15 DIAGNOSIS — Z794 Long term (current) use of insulin: Secondary | ICD-10-CM

## 2015-03-15 DIAGNOSIS — Z113 Encounter for screening for infections with a predominantly sexual mode of transmission: Secondary | ICD-10-CM | POA: Insufficient documentation

## 2015-03-15 DIAGNOSIS — E118 Type 2 diabetes mellitus with unspecified complications: Secondary | ICD-10-CM

## 2015-03-15 DIAGNOSIS — H9072 Mixed conductive and sensorineural hearing loss, unilateral, left ear, with unrestricted hearing on the contralateral side: Secondary | ICD-10-CM | POA: Diagnosis not present

## 2015-03-15 DIAGNOSIS — H6983 Other specified disorders of Eustachian tube, bilateral: Secondary | ICD-10-CM | POA: Diagnosis not present

## 2015-03-15 DIAGNOSIS — E785 Hyperlipidemia, unspecified: Secondary | ICD-10-CM

## 2015-03-15 LAB — CBC WITH DIFFERENTIAL/PLATELET
Basophils Absolute: 0 10*3/uL (ref 0.0–0.1)
Basophils Relative: 0 % (ref 0–1)
Eosinophils Absolute: 0.3 10*3/uL (ref 0.0–0.7)
Eosinophils Relative: 4 % (ref 0–5)
HCT: 34.6 % — ABNORMAL LOW (ref 36.0–46.0)
Hemoglobin: 11.2 g/dL — ABNORMAL LOW (ref 12.0–15.0)
LYMPHS ABS: 1.4 10*3/uL (ref 0.7–4.0)
LYMPHS PCT: 18 % (ref 12–46)
MCH: 28.4 pg (ref 26.0–34.0)
MCHC: 32.4 g/dL (ref 30.0–36.0)
MCV: 87.6 fL (ref 78.0–100.0)
MONOS PCT: 7 % (ref 3–12)
MPV: 9.5 fL (ref 8.6–12.4)
Monocytes Absolute: 0.5 10*3/uL (ref 0.1–1.0)
NEUTROS ABS: 5.5 10*3/uL (ref 1.7–7.7)
NEUTROS PCT: 71 % (ref 43–77)
PLATELETS: 201 10*3/uL (ref 150–400)
RBC: 3.95 MIL/uL (ref 3.87–5.11)
RDW: 16.3 % — ABNORMAL HIGH (ref 11.5–15.5)
WBC: 7.7 10*3/uL (ref 4.0–10.5)

## 2015-03-15 LAB — COMPLETE METABOLIC PANEL WITH GFR
ALT: 13 U/L (ref 0–35)
AST: 15 U/L (ref 0–37)
Albumin: 3.2 g/dL — ABNORMAL LOW (ref 3.5–5.2)
Alkaline Phosphatase: 69 U/L (ref 39–117)
BILIRUBIN TOTAL: 0.3 mg/dL (ref 0.2–1.2)
BUN: 33 mg/dL — ABNORMAL HIGH (ref 6–23)
CO2: 24 mEq/L (ref 19–32)
CREATININE: 1.94 mg/dL — AB (ref 0.50–1.10)
Calcium: 8.1 mg/dL — ABNORMAL LOW (ref 8.4–10.5)
Chloride: 109 mEq/L (ref 96–112)
GFR, Est African American: 30 mL/min — ABNORMAL LOW
GFR, Est Non African American: 26 mL/min — ABNORMAL LOW
GLUCOSE: 90 mg/dL (ref 70–99)
Potassium: 3.7 mEq/L (ref 3.5–5.3)
Sodium: 143 mEq/L (ref 135–145)
Total Protein: 6.2 g/dL (ref 6.0–8.3)

## 2015-03-15 LAB — LIPID PANEL
CHOL/HDL RATIO: 2.8 ratio
Cholesterol: 180 mg/dL (ref 0–200)
HDL: 64 mg/dL (ref >=46)
LDL CALC: 89 mg/dL (ref 0–99)
TRIGLYCERIDES: 135 mg/dL (ref <150)
VLDL: 27 mg/dL (ref 0–40)

## 2015-03-15 MED ORDER — "INSULIN SYRINGE-NEEDLE U-100 31G X 15/64"" 0.3 ML MISC": Status: DC

## 2015-03-15 NOTE — Progress Notes (Signed)
Medical Nutrition Therapy:  Appt start time: 1045 am end time:  1215  Assessment:  Primary concerns today: blood sugars/weight loss, wants to be off insulin.  Patient says she started wakling daily after her kidney transplant, felt very good and then she stopped and has not walked since. She is trying to eat right so she can decrease her weight and stop her need for insulin. She gets very hungry after breakfast and lunch and was eating several full meal sin a row until she saw her weigh tos 174. She did not know about prednisone and insulin's affects on weight/hunger/blood sugars. Has trouble with injections bruising. Uses thighs for injections. discussed using shorter syringes and alternate sites.   Preferred Learning Style: Auditory, Visual, Hands on Learning Readiness: Ready and some Change in progress MEDICATIONS: prednisone- 5 mg qam, Novolin 70/30 insulin 18 before breakfast and 16 units before dinner ~ 2x/week.which she likes, Triumeq, prograf, senokot, mag ox WEIGHT HISTORY: 125-135# before kidney transplant, she was upset at 174 which is her highest weight in past 10 years DIETARY INTAKE: Patient says she was getting hungry right after eating breakfast, but recently began starting to cut back and try ot eat only 3 merals a day./ Sweets are also something she'd like ot learn to eat in moderation. Usual eating pattern includes 3 meals and 0-2 snacks per day. Everyday foods include bread, vegetables, decaf coffee.  Avoided foods include milk, fried foods.   24-hr recall:  Wakes ~ 7 AM, gets out of bed ~ 830-9 AM B ( 9-10 AM): ~ 1 cup grits, sausage-2-3oz, or bacon x2 or corned beef, 3oz and decaf coffee- equal and powdered creamer or cold cereal like cornflakeswith almond milk  30-60 grams CARB L (1-230  PM): chicken salad sandiwch made from canned chicken,grahms x 3 squares or  peanut butter and jelly sandwich on wheat bread , water 30-45 g CARB Snk ( PM): not often sometimes grapes D (5-7  PM): baked or boiled or broiled chicken or pork or hamburger, always a vegetables like greens, broccoli or cabbage, 2 TBSP tub margarine, some days 3 iz boiled potato 30- 45 g CARB Snk ( PM): cold cereal or whole peanut butter sandwich on wheat bread Beverages: mostly water, decaf coffee daily , regular soda or juice or tea on occasion,almond milk with cereal  Physical activity: ADLs currenlty Blood sugars:  A1Cs have been good with highest recently 6.9%, Checks once a day in the am, average of past 30 days ~124 which is improved over 90 days Estimated energy needs: 1500-1600 calories 130-150 g carbohydrates 75-85 g protein  Progress Towards Goal(s):  In progress.   Nutritional Diagnosis:  NB-1.1 Food and nutrition-related knowledge deficit As related to lack of sufficient previous diabetes and nutrtion training.  As evidenced by her report, questions and stated lack of knowledge.    Intervention:  Nutrition education about: diabetes medicines, healthier dietary choices, support for healthy behavior currently doing, assist with restarting walking habit, coordination of care- consider dcing PM insulin and can use prandin as needed in PM for dinner. Consider using only Nph qam or decreased dose of 70/30 as able ( have her check pre lunch and pre dinner twice a week)  to assist with weight loss  Teaching Method Utilized: Visual,  Auditory,  Hands on Handouts given during visit include:AVS and carb counting meal planning book weight loss tips, [facility] and [facility] contract signed by patient to begin walking this week Barriers to  learning/adherence to lifestyle change: finances and meds that cause weight gain, hunger and high blood sugars Demonstrated degree of understanding via:  Teach Back   Monitoring/Evaluation:  Dietary intake, exercise, meter, and body weight in 3 week(s).

## 2015-03-15 NOTE — Progress Notes (Signed)
Hi Butch Penny,    Thanks for the suggestions. I think she wants to stay with her Endocrinology/Transplant team for medication recommendations. She was however. Very interested in learning about calorie count and weight loss plan and wanted to discuss these things with you. I am not sure how to forward your recommendations to her Endocrinologist, her group is at [facility].   Thanks,   SK

## 2015-03-15 NOTE — Progress Notes (Signed)
Patient requests shorter syringes

## 2015-03-15 NOTE — Patient Instructions (Signed)
Please try to have 1-2 fruit servings a day Try to walk 15 minutes or more a day Cut out cornflakes cereal- can have old fashioned oats or raisin bran instead Try to cut back on breakfast meats-can have cheese or [place] sausage, cottage cheese or yogurt

## 2015-03-16 LAB — RPR: RPR: REACTIVE — AB

## 2015-03-16 LAB — T-HELPER CELL (CD4) - (RCID CLINIC ONLY)
CD4 % Helper T Cell: 17 % — ABNORMAL LOW (ref 33–55)
CD4 T CELL ABS: 220 /uL — AB (ref 400–2700)

## 2015-03-16 LAB — URINE CYTOLOGY ANCILLARY ONLY
Chlamydia: NEGATIVE
Neisseria Gonorrhea: NEGATIVE

## 2015-03-16 LAB — FLUORESCENT TREPONEMAL AB(FTA)-IGG-BLD: Fluorescent Treponemal ABS: REACTIVE — AB

## 2015-03-16 LAB — RPR TITER: RPR Titer: 1:4 {titer}

## 2015-03-16 LAB — HIV-1 RNA QUANT-NO REFLEX-BLD: HIV 1 RNA Quant: <20 copies/mL (ref <20)

## 2015-03-29 ENCOUNTER — Ambulatory Visit (INDEPENDENT_AMBULATORY_CARE_PROVIDER_SITE_OTHER): Payer: Medicare Other | Admitting: Infectious Disease

## 2015-03-29 ENCOUNTER — Encounter: Payer: Self-pay | Admitting: Infectious Disease

## 2015-03-29 VITALS — BP 156/84 | HR 65 | Temp 97.8°F | Wt 177.0 lb

## 2015-03-29 DIAGNOSIS — Z940 Kidney transplant status: Secondary | ICD-10-CM

## 2015-03-29 DIAGNOSIS — N183 Chronic kidney disease, stage 3 (moderate): Secondary | ICD-10-CM | POA: Diagnosis not present

## 2015-03-29 DIAGNOSIS — Z21 Asymptomatic human immunodeficiency virus [HIV] infection status: Secondary | ICD-10-CM | POA: Diagnosis not present

## 2015-03-29 DIAGNOSIS — E785 Hyperlipidemia, unspecified: Secondary | ICD-10-CM | POA: Diagnosis not present

## 2015-03-29 DIAGNOSIS — I10 Essential (primary) hypertension: Secondary | ICD-10-CM

## 2015-03-29 DIAGNOSIS — B20 Human immunodeficiency virus [HIV] disease: Secondary | ICD-10-CM

## 2015-03-29 NOTE — Progress Notes (Signed)
Subjective:    Patient ID: [name], female    DOB: 05-29-1946, 69 y.o.   MRN: [id number]  HPI   69 year-old woman with past medical history is significant for HIV infection, hypertension, diabetes mellitus type II, sp renal transplant > one year ago at [facility]. She has been currently with perfect suppression on Sustiva and Epzicom then changed to [facility].  She was given treated with  HARVONI  At [facility]  For her HCV  Her serum creatinine has been up over the past several months in the 1.9 range here at [facility].     Review of Systems  Constitutional: Negative for fever, chills, diaphoresis, activity change, appetite change, fatigue and unexpected weight change.  HENT: Negative for congestion, rhinorrhea, sinus pressure, sneezing, sore throat and trouble swallowing.   Eyes: Negative for photophobia and visual disturbance.  Respiratory: Negative for cough, chest tightness, shortness of breath, wheezing and stridor.   Cardiovascular: Negative for chest pain, palpitations and leg swelling.  Gastrointestinal: Negative for nausea, vomiting, abdominal pain, diarrhea, constipation, blood in stool, abdominal distention and anal bleeding.  Genitourinary: Negative for dysuria, hematuria, flank pain and difficulty urinating.  Musculoskeletal: Negative for myalgias, back pain, joint swelling and gait problem.  Skin: Negative for color change, pallor, rash and wound.  Neurological: Negative for dizziness, tremors, weakness and light-headedness.  Hematological: Negative for adenopathy. Does not bruise/bleed easily.  Psychiatric/Behavioral: Negative for behavioral problems, confusion, sleep disturbance, dysphoric mood, decreased concentration and agitation.       Objective:   Physical Exam  Constitutional: She is oriented to person, place, and time. She appears well-developed and well-nourished. No distress.  HENT:  Head: Normocephalic and atraumatic.  Mouth/Throat: Oropharynx is clear and  moist. No oropharyngeal exudate.  Eyes: Conjunctivae and EOM are normal. Pupils are equal, round, and reactive to light. No scleral icterus.  Neck: Normal range of motion. Neck supple. No JVD present.  Cardiovascular: Normal rate, regular rhythm and normal heart sounds.  Exam reveals no gallop and no friction rub.   No murmur heard. Pulmonary/Chest: Effort normal and breath sounds normal. No respiratory distress. She has no wheezes. She has no rales. She exhibits no tenderness.  Abdominal: She exhibits no distension and no mass. There is no tenderness. There is no rebound and no guarding.  Musculoskeletal: She exhibits no edema or tenderness.  Lymphadenopathy:    She has no cervical adenopathy.  Neurological: She is alert and oriented to person, place, and time. Coordination normal.  Skin: Skin is warm and dry. She is not diaphoretic. No erythema. No pallor.  Psychiatric: She has a normal mood and affect. Her behavior is normal. Judgment and thought content normal.          Assessment & Plan:  HIV:   continue Triumeq ONCE again for  dosing simplicity and one pill once a day . Her GFR is at 30 and we are giving her more lamivudine than is typically recommended for this GFR. I feel this is OK given recent experience, data from TAF based regimens such as GENVOYA, ODEFSEY and DESCOVY that have used similar NRTI, emtricitabine at GFR to 30 (and being studied below 30)  Could consider change to Tivicay and Descovy (when latter is covered) if concerned about 3tc not being studied at this GFR. Would not go to [facility] with drug interactions with immunosuppressants or ODEFSEY due to her being on PPI  I spent greater than 25 minutes with the patient including greater than 50% of  time in face to face counsel of the patient re her current ARV and renal dosing. and in coordination of their care.   Hepatitis C: genotype 1b,: finished with this and hopefully cured had at one point not been taking her  prilosec correctly with this  Renal transplantation: being followed VERY closely at [facility], cr up slightly   DM: being followed by Endocrine, and Dr Hayes Ludwig here in [facility] at [facility].

## 2015-04-06 DIAGNOSIS — Z940 Kidney transplant status: Secondary | ICD-10-CM | POA: Diagnosis not present

## 2015-04-06 DIAGNOSIS — B182 Chronic viral hepatitis C: Secondary | ICD-10-CM | POA: Diagnosis not present

## 2015-04-06 DIAGNOSIS — Z79899 Other long term (current) drug therapy: Secondary | ICD-10-CM | POA: Diagnosis not present

## 2015-04-07 ENCOUNTER — Ambulatory Visit (INDEPENDENT_AMBULATORY_CARE_PROVIDER_SITE_OTHER): Payer: Medicare Other | Admitting: Dietician

## 2015-04-07 ENCOUNTER — Encounter: Payer: Self-pay | Admitting: Dietician

## 2015-04-07 VITALS — Wt 177.0 lb

## 2015-04-07 DIAGNOSIS — Z794 Long term (current) use of insulin: Secondary | ICD-10-CM | POA: Diagnosis not present

## 2015-04-07 DIAGNOSIS — Z713 Dietary counseling and surveillance: Secondary | ICD-10-CM | POA: Diagnosis not present

## 2015-04-07 DIAGNOSIS — E118 Type 2 diabetes mellitus with unspecified complications: Secondary | ICD-10-CM

## 2015-04-07 DIAGNOSIS — E119 Type 2 diabetes mellitus without complications: Secondary | ICD-10-CM

## 2015-04-07 NOTE — Progress Notes (Signed)
  Medical Nutrition Therapy:  Appt start time: 1030 am end time:  1115  Assessment:  Primary concerns reiterated today: blood sugars/weight loss, wants to be off insulin.  [name] has been feeling tired the past few days although she saw her transplant doctors yesterday and all checked out fine. She started walking daily for 1 week after our last visit, then got busy and stopped walking. Signed up for Zumba tonight, but not sure she'll go. She showed me her phone that tracks her steps and her average is 2150 steps/day(sedentary)  for the past month.  She no longer gets very hungry after breakfast and lunch, is eating more fruit instead of cookies and oatmeal instead of sausage and grits, . She is not interested in keeping food records.She does agree to daily weighing.  MEDICATIONS: prednisone- 5 mg qam, Novolin 70/30 insulin 18 before breakfast and cut herself back to 10 units because she had low blood sugars of 69, 70s where she felt bad) 3x/week units before dinner  WEIGHT HISTORY: 125-135# before kidney transplant, 177# today, goal is < 167# DIETARY INTAKE: Usual eating pattern includes 3 meals and 0-2 snacks per day. 24-hr recall not done today:   Physical activity: ADLs and some walking inconsistently  Blood sugars:  She did not bring her meter, reports that her blood sugars are mostly in 120s.   Learning Readiness: Ready and some Change in progress Progress Towards Goal(s):  Some progress, made some changes, but weight increased.    Nutritional Diagnosis:  NB-1.1 Food and nutrition-related knowledge deficit As related to lack of sufficient previous diabetes and nutrtion training.  As evidenced by her report, questions and stated lack of knowledge.    Intervention:  Nutrition education about importance of self monitoring both blood sugars,  Weight, and goals she is working on such as walking. Discussed increasing goals by 100 steps per day gradually.  Coordination of care:  Consider adding  metformin, also using only Nph qam or decreased dose of 70/30 as able (have her check pre lunch and pre dinner twice a week)  to assist with weight loss Barriers to learning/adherence to lifestyle change: finances,  meds that cause weight gain, hunger and high blood sugars  Demonstrated degree of understanding via:  Teach Back   Monitoring/Evaluation:  Dietary intake, exercise, meter, and body weight in 2 week(s) patient signed up for group in 2 weeks.

## 2015-04-07 NOTE — Patient Instructions (Signed)
Your goal for the next week is to weigh yourself one time a day.   Keep working at walking daily for 20 minutes or use your phone to aim for more than 3000 steps a day.    Please bring your meter.

## 2015-04-08 DIAGNOSIS — Z5181 Encounter for therapeutic drug level monitoring: Secondary | ICD-10-CM | POA: Diagnosis not present

## 2015-04-08 DIAGNOSIS — Z9483 Pancreas transplant status: Secondary | ICD-10-CM | POA: Diagnosis not present

## 2015-04-08 DIAGNOSIS — Z940 Kidney transplant status: Secondary | ICD-10-CM | POA: Diagnosis not present

## 2015-04-14 ENCOUNTER — Telehealth: Payer: Self-pay | Admitting: Dietician

## 2015-04-14 NOTE — Telephone Encounter (Signed)
Call to patient to confirm appointment for 04/15/15 at 9:30 lmtcb ° °

## 2015-04-15 ENCOUNTER — Ambulatory Visit (INDEPENDENT_AMBULATORY_CARE_PROVIDER_SITE_OTHER): Payer: Medicare Other | Admitting: Dietician

## 2015-04-15 VITALS — Wt 177.3 lb

## 2015-04-15 DIAGNOSIS — Z713 Dietary counseling and surveillance: Secondary | ICD-10-CM | POA: Diagnosis not present

## 2015-04-15 DIAGNOSIS — E119 Type 2 diabetes mellitus without complications: Secondary | ICD-10-CM | POA: Diagnosis not present

## 2015-04-15 DIAGNOSIS — E118 Type 2 diabetes mellitus with unspecified complications: Secondary | ICD-10-CM

## 2015-04-15 DIAGNOSIS — Z794 Long term (current) use of insulin: Secondary | ICD-10-CM | POA: Diagnosis not present

## 2015-04-15 NOTE — Patient Instructions (Signed)
Please make a follow up in 2 weeks for Thursday at 930 for group.   Keep up the great work of being active and eating healthier!  

## 2015-04-16 ENCOUNTER — Encounter: Payer: Self-pay | Admitting: *Deleted

## 2015-04-16 NOTE — Progress Notes (Signed)
  Medical Nutrition Therapy:  Appt start time: 930 am end time:  1030 2nd visit, today in group MNT,   Assessment:  Primary concerns reiterated today: blood sugars/weight loss, wants to be off insulin.  [name] attended group session today: . The group discussed and shared how activity and healthy choices as well as portion sizes and support are important for health and weight loss. Topics covered: Non meat sources of protein, effects of no activity, effects of weight on diabetes.  MEDICATIONS: prednisone- 5 mg qam, Novolin 70/30 insulin 18 before breakfast and cut herself back to 10 units WEIGHT:  177# today, goal is < 167# DIETARY INTAKE: Usual eating pattern includes 3 meals and 0-2 snacks per day. 24-hr recall not done today:  Physical activity: ADLs and some walking inconsistently  Blood sugars:  She did bring her meter:  Range is 87-194, avergae is 129. Checking blood sugar 1.1 times a day, mostly occuring in morning.  Learning Readiness: Ready and some Change in progress  Progress Towards Goal(s):  Some progress, made some changes, but weight increased.    Nutritional Diagnosis:  NB-1.1 Food and nutrition-related knowledge deficit As related to lack of sufficient previous diabetes and nutrtion training.  As evidenced by her report, questions and stated lack of knowledge.    Intervention:  Nutrition education about healthy choices to promote weight loss. Coordination of care:  Transplant group is managing diabetes per Dr. Hayes [name], no response form her NP at [facility] regarding diabetes medicines Barriers to learning/adherence to lifestyle change: finances,  meds that cause weight gain(HIV, steroids, insulin and antirejection meds) , hunger and high blood sugars  Demonstrated degree of understanding via:  Teach Back   Monitoring/Evaluation:  Dietary intake, exercise, meter, and body weight in 2 week(s) patient signed up for group in 2 weeks.

## 2015-04-20 ENCOUNTER — Encounter: Payer: Self-pay | Admitting: *Deleted

## 2015-04-26 DIAGNOSIS — T8619 Other complication of kidney transplant: Secondary | ICD-10-CM | POA: Diagnosis not present

## 2015-04-26 DIAGNOSIS — I10 Essential (primary) hypertension: Secondary | ICD-10-CM | POA: Diagnosis not present

## 2015-04-26 DIAGNOSIS — Z7982 Long term (current) use of aspirin: Secondary | ICD-10-CM | POA: Diagnosis not present

## 2015-04-26 DIAGNOSIS — D899 Disorder involving the immune mechanism, unspecified: Secondary | ICD-10-CM | POA: Diagnosis not present

## 2015-04-26 DIAGNOSIS — Z794 Long term (current) use of insulin: Secondary | ICD-10-CM | POA: Diagnosis not present

## 2015-04-26 DIAGNOSIS — Z21 Asymptomatic human immunodeficiency virus [HIV] infection status: Secondary | ICD-10-CM | POA: Diagnosis not present

## 2015-04-26 DIAGNOSIS — R809 Proteinuria, unspecified: Secondary | ICD-10-CM | POA: Diagnosis not present

## 2015-04-26 DIAGNOSIS — Z940 Kidney transplant status: Secondary | ICD-10-CM | POA: Diagnosis not present

## 2015-04-26 DIAGNOSIS — Z8619 Personal history of other infectious and parasitic diseases: Secondary | ICD-10-CM | POA: Diagnosis not present

## 2015-04-26 DIAGNOSIS — E785 Hyperlipidemia, unspecified: Secondary | ICD-10-CM | POA: Diagnosis not present

## 2015-04-26 DIAGNOSIS — Z7952 Long term (current) use of systemic steroids: Secondary | ICD-10-CM | POA: Diagnosis not present

## 2015-04-26 DIAGNOSIS — Z79899 Other long term (current) drug therapy: Secondary | ICD-10-CM | POA: Diagnosis not present

## 2015-04-26 DIAGNOSIS — E1121 Type 2 diabetes mellitus with diabetic nephropathy: Secondary | ICD-10-CM | POA: Diagnosis not present

## 2015-04-27 DIAGNOSIS — E785 Hyperlipidemia, unspecified: Secondary | ICD-10-CM | POA: Diagnosis not present

## 2015-04-27 DIAGNOSIS — T8619 Other complication of kidney transplant: Secondary | ICD-10-CM | POA: Diagnosis not present

## 2015-04-27 DIAGNOSIS — Z21 Asymptomatic human immunodeficiency virus [HIV] infection status: Secondary | ICD-10-CM | POA: Diagnosis not present

## 2015-04-27 DIAGNOSIS — E1121 Type 2 diabetes mellitus with diabetic nephropathy: Secondary | ICD-10-CM | POA: Diagnosis not present

## 2015-04-27 DIAGNOSIS — I10 Essential (primary) hypertension: Secondary | ICD-10-CM | POA: Diagnosis not present

## 2015-04-27 DIAGNOSIS — Z8619 Personal history of other infectious and parasitic diseases: Secondary | ICD-10-CM | POA: Diagnosis not present

## 2015-04-29 ENCOUNTER — Ambulatory Visit: Payer: Medicare Other | Admitting: Dietician

## 2015-05-18 DIAGNOSIS — Z940 Kidney transplant status: Secondary | ICD-10-CM | POA: Diagnosis not present

## 2015-05-18 DIAGNOSIS — E118 Type 2 diabetes mellitus with unspecified complications: Secondary | ICD-10-CM | POA: Diagnosis not present

## 2015-05-18 DIAGNOSIS — I129 Hypertensive chronic kidney disease with stage 1 through stage 4 chronic kidney disease, or unspecified chronic kidney disease: Secondary | ICD-10-CM | POA: Diagnosis not present

## 2015-05-18 DIAGNOSIS — N183 Chronic kidney disease, stage 3 (moderate): Secondary | ICD-10-CM | POA: Diagnosis not present

## 2015-06-01 DIAGNOSIS — Z4822 Encounter for aftercare following kidney transplant: Secondary | ICD-10-CM | POA: Diagnosis not present

## 2015-06-01 DIAGNOSIS — E1121 Type 2 diabetes mellitus with diabetic nephropathy: Secondary | ICD-10-CM | POA: Diagnosis not present

## 2015-06-01 DIAGNOSIS — R809 Proteinuria, unspecified: Secondary | ICD-10-CM | POA: Diagnosis not present

## 2015-06-01 DIAGNOSIS — I159 Secondary hypertension, unspecified: Secondary | ICD-10-CM | POA: Diagnosis not present

## 2015-06-01 DIAGNOSIS — Z23 Encounter for immunization: Secondary | ICD-10-CM | POA: Diagnosis not present

## 2015-06-01 DIAGNOSIS — Z21 Asymptomatic human immunodeficiency virus [HIV] infection status: Secondary | ICD-10-CM | POA: Diagnosis not present

## 2015-06-01 DIAGNOSIS — Z940 Kidney transplant status: Secondary | ICD-10-CM | POA: Diagnosis not present

## 2015-06-01 DIAGNOSIS — B1920 Unspecified viral hepatitis C without hepatic coma: Secondary | ICD-10-CM | POA: Diagnosis not present

## 2015-06-01 DIAGNOSIS — Z79899 Other long term (current) drug therapy: Secondary | ICD-10-CM | POA: Diagnosis not present

## 2015-06-01 DIAGNOSIS — I10 Essential (primary) hypertension: Secondary | ICD-10-CM | POA: Diagnosis not present

## 2015-06-10 ENCOUNTER — Ambulatory Visit (INDEPENDENT_AMBULATORY_CARE_PROVIDER_SITE_OTHER): Payer: Medicare Other | Admitting: Internal Medicine

## 2015-06-10 ENCOUNTER — Other Ambulatory Visit: Payer: Self-pay | Admitting: Internal Medicine

## 2015-06-10 ENCOUNTER — Encounter: Payer: Self-pay | Admitting: Internal Medicine

## 2015-06-10 VITALS — BP 150/60 | HR 64 | Temp 97.6°F | Wt 169.5 lb

## 2015-06-10 DIAGNOSIS — Z794 Long term (current) use of insulin: Secondary | ICD-10-CM | POA: Diagnosis not present

## 2015-06-10 DIAGNOSIS — M545 Low back pain, unspecified: Secondary | ICD-10-CM

## 2015-06-10 DIAGNOSIS — E1121 Type 2 diabetes mellitus with diabetic nephropathy: Secondary | ICD-10-CM

## 2015-06-10 DIAGNOSIS — E119 Type 2 diabetes mellitus without complications: Secondary | ICD-10-CM | POA: Diagnosis not present

## 2015-06-10 DIAGNOSIS — I10 Essential (primary) hypertension: Secondary | ICD-10-CM

## 2015-06-10 DIAGNOSIS — E118 Type 2 diabetes mellitus with unspecified complications: Secondary | ICD-10-CM

## 2015-06-10 DIAGNOSIS — Z780 Asymptomatic menopausal state: Secondary | ICD-10-CM

## 2015-06-10 DIAGNOSIS — Z0000 Encounter for general adult medical examination without abnormal findings: Secondary | ICD-10-CM

## 2015-06-10 DIAGNOSIS — N184 Chronic kidney disease, stage 4 (severe): Secondary | ICD-10-CM

## 2015-06-10 LAB — POCT GLYCOSYLATED HEMOGLOBIN (HGB A1C): HEMOGLOBIN A1C: 6.3

## 2015-06-10 LAB — GLUCOSE, CAPILLARY: GLUCOSE-CAPILLARY: 135 mg/dL — AB (ref 65–99)

## 2015-06-10 MED ORDER — INSULIN NPH ISOPHANE & REGULAR (70-30) 100 UNIT/ML ~~LOC~~ SUSP: SUBCUTANEOUS | Status: DC

## 2015-06-10 NOTE — Assessment & Plan Note (Signed)
With the chronic, insidious onset and lack of any red flag symptoms as described above, likely musculoligamentous strain, as it is exacerbated by walking and she has increased her exercise recently. Instructed patient to stretch her back, apply ice packs, icy hot or other analgesic patches as well as tylenol to see if this helps. Asked patient to notify us if these measures don't help in the next few weeks. Also instructed patient to avoid NSAIDs given her renal function.

## 2015-06-10 NOTE — Assessment & Plan Note (Signed)
UTD on eye exam, diabetic foot exam, and colonoscopy. At previous visit, patient declined DEXA but today is agreeable on getting a DEXA with her next mammogram.

## 2015-06-10 NOTE — Assessment & Plan Note (Addendum)
.   Lab Results  Component Value Date   HGBA1C 6.3 06/10/2015   HGBA1C 6.9 03/01/2015   HGBA1C 6.5 11/09/2014     Assessment: Diabetes control:  Well-controlled Progress toward A1C goal:   At goal Comments: Today A1c is 6.3 with sugars as low as 70. No hypoglycemic symptoms reported, including changes in vision, weakness, changes in mental status or consciousness. No sugars above 180. Currently on Novo 70/30 18U AM and 16 U PM but she often decreases or omits her PM dose.   Plan: Medications:  Decrease Novo 70/30 to 13 units AM and 13 units PM. Instructed patient to report glucose readings to [place] in 2 weeks to assess response. Home glucose monitoring: Frequency:   Timing:   Instruction/counseling given: reminded to get eye exam, reminded to bring blood glucose meter & log to each visit, reminded to bring medications to each visit, discussed foot care, discussed the need for weight loss, discussed diet and other instruction/counseling: performed foot exam today Educational resources provided:   Self management tools provided:   Other plans:

## 2015-06-10 NOTE — Patient Instructions (Signed)
[name],  Since you sugars were so well-controlled and we don't want your sugars to get too low, we have decided to lower your insulin to 13 units in the morning and 13 at night (down from 18 and 16). Please take your sugars and let us know how they are doing in a couple of weeks.  Also, try and use ice, icy-hot, or other patches as well as Tylenol for your back pain and let us know if this is helping at all. If not, please let us know.  Finally, when you next schedule your mammogram, please also get a bone density (DEXA) scan at the same time.  Please come back to see [place] in 3 months for routine follow-up.  Thanks for letting us be a part of your team,  Blane Ohara, MD

## 2015-06-10 NOTE — Progress Notes (Signed)
Internal Medicine Clinic Attending  I saw and evaluated the patient.  I personally confirmed the key portions of the history and exam documented by Dr. Merrilyn Puma and I reviewed pertinent patient test results.  The assessment, diagnosis, and plan were formulated together and I agree with the documentation in the resident's note. Madilyn Fireman MD MPH 06/10/2015 2:37 PM

## 2015-06-10 NOTE — Progress Notes (Signed)
   Subjective:    Patient ID: [name], female    DOB: 12/10/1945, 69 y.o.   MRN: [id number]  HPI Cyanni Kapoor is a 69yo black female with PMH as noted below for routine 24-month follow up in clinic today. Her only new complaint is of lower back pain over the past 2 months that only comes on while walking or exercising and is relieved by rest. She says it is band-like in nature and is a constant 'pressure' that is 8/10 in severity. It is relieved by stopping, but she has not tried any icing or OTC medications to see if they work. There is no point tenderness, fever, changes in bowel or urinary function, or lower extremity numbness/week. She has no other issues at this time. Please see problem-based charting for assessment and plan.   Review of Systems  Constitutional: Negative.   HENT: Negative.   Eyes: Negative.   Respiratory: Negative.   Cardiovascular: Negative.   Gastrointestinal: Negative.   Endocrine: Negative.   Genitourinary: Negative.   Musculoskeletal: Positive for back pain.  Skin: Negative.   Allergic/Immunologic: Negative.   Neurological: Negative.   Hematological: Negative.   Psychiatric/Behavioral: Negative.        Objective:   Physical Exam  Constitutional: She is oriented to person, place, and time. She appears well-nourished. No distress.  HENT:  Head: Normocephalic and atraumatic.  Right Ear: External ear normal.  Left Ear: External ear normal.  Nose: Nose normal.  Mouth/Throat: Oropharynx is clear and moist.  Eyes: Conjunctivae and EOM are normal. Pupils are equal, round, and reactive to light.  Neck: Normal range of motion. Neck supple. No thyromegaly present.  Cardiovascular: Normal rate, regular rhythm, normal heart sounds and intact distal pulses.  Exam reveals no gallop and no friction rub.   No murmur heard. Pulmonary/Chest: Effort normal and breath sounds normal. No respiratory distress. She has no wheezes. She has no rales. She exhibits no  tenderness.  Abdominal: Soft. Bowel sounds are normal.  Musculoskeletal: Normal range of motion. She exhibits no edema or tenderness.  Neurological: She is alert and oriented to person, place, and time. She has normal reflexes. She exhibits normal muscle tone.  Skin: Skin is warm. She is not diaphoretic.  Psychiatric: She has a normal mood and affect. Her behavior is normal.  Nursing note and vitals reviewed.  Results for orders placed or performed in visit on 06/10/15 (from the past 24 hour(s))  Glucose, capillary     Status: Abnormal   Collection Time: 06/10/15  1:47 PM  Result Value Ref Range   Glucose-Capillary 135 (H) 65 - 99 mg/dL  POCT HgB A1C     Status: None   Collection Time: 06/10/15  2:00 PM  Result Value Ref Range   Hemoglobin A1C 6.3       Assessment & Plan:  Please see problem list for assessment and plan.

## 2015-06-10 NOTE — Assessment & Plan Note (Signed)
BP Readings from Last 3 Encounters:  06/10/15 150/60  03/29/15 156/84  03/01/15 167/77    Lab Results  Component Value Date   NA 143 03/15/2015   K 3.7 03/15/2015   CREATININE 1.94* 03/15/2015    Assessment: Blood pressure control:   Needs improvement Progress toward BP goal:    Improving Comments: Her BP today is 150/60, which is an improvement from last visit and closer to her goal of 140/80.  Plan: Medications:  continue current medications Educational resources provided:   Self management tools provided:   Other plans: Her medications were recently increased by her other physicians within the last month, with losartan increased to 100mg  QD and furosemide increased to 40mg  BID. Will reassess at next visit and see if further medication increase is required.

## 2015-06-11 ENCOUNTER — Telehealth: Payer: Self-pay | Admitting: Dietician

## 2015-06-11 NOTE — Telephone Encounter (Signed)
Called to follow up on MNT goal of walking for 15 minutes a day to lower her blood sugars and weight. She graded herself a 3/5 saying she did walk but not every day. Used motivational interviewing to help her self-assess her progress.   She says she is walking more, but has not found a good time for her to do it that she can be consistent. She is also staying away from cookies, cakes and candy more than she had before, She is happy her weight is decreased but wondered if that was because they increased her fluid pill. CDE disucssed that less water weight would not affect her A1C/blood sugar control an dit is better along with her weight loss and lifestyle changes.  A1c- 6.9 to 6.3 Weight 177# to 169.5# She asked about addional MNT and was encouraged to follow up with group or individual.

## 2015-06-17 DIAGNOSIS — Z940 Kidney transplant status: Secondary | ICD-10-CM | POA: Diagnosis not present

## 2015-06-17 DIAGNOSIS — Z5181 Encounter for therapeutic drug level monitoring: Secondary | ICD-10-CM | POA: Diagnosis not present

## 2015-06-29 DIAGNOSIS — E118 Type 2 diabetes mellitus with unspecified complications: Secondary | ICD-10-CM | POA: Diagnosis not present

## 2015-06-29 DIAGNOSIS — N183 Chronic kidney disease, stage 3 (moderate): Secondary | ICD-10-CM | POA: Diagnosis not present

## 2015-06-29 DIAGNOSIS — I129 Hypertensive chronic kidney disease with stage 1 through stage 4 chronic kidney disease, or unspecified chronic kidney disease: Secondary | ICD-10-CM | POA: Diagnosis not present

## 2015-06-29 DIAGNOSIS — Z940 Kidney transplant status: Secondary | ICD-10-CM | POA: Diagnosis not present

## 2015-07-06 ENCOUNTER — Other Ambulatory Visit: Payer: Self-pay | Admitting: *Deleted

## 2015-07-06 DIAGNOSIS — E785 Hyperlipidemia, unspecified: Secondary | ICD-10-CM

## 2015-07-06 MED ORDER — ROSUVASTATIN CALCIUM 20 MG PO TABS: 20.0000 mg | ORAL_TABLET | Freq: Every day | ORAL | Status: DC

## 2015-07-20 ENCOUNTER — Other Ambulatory Visit: Payer: Medicare Other

## 2015-07-20 ENCOUNTER — Other Ambulatory Visit (HOSPITAL_COMMUNITY)
Admission: RE | Admit: 2015-07-20 | Discharge: 2015-07-20 | Disposition: A | Discharge status: Home / Self Care | Payer: Medicare Other | Source: Ambulatory Visit | Attending: Infectious Disease | Admitting: Infectious Disease

## 2015-07-20 DIAGNOSIS — E785 Hyperlipidemia, unspecified: Secondary | ICD-10-CM

## 2015-07-20 DIAGNOSIS — N183 Chronic kidney disease, stage 3 (moderate): Secondary | ICD-10-CM

## 2015-07-20 DIAGNOSIS — B20 Human immunodeficiency virus [HIV] disease: Secondary | ICD-10-CM

## 2015-07-20 DIAGNOSIS — Z113 Encounter for screening for infections with a predominantly sexual mode of transmission: Secondary | ICD-10-CM | POA: Insufficient documentation

## 2015-07-20 LAB — CBC WITH DIFFERENTIAL/PLATELET
BASOS ABS: 0 10*3/uL (ref 0.0–0.1)
Basophils Relative: 0 % (ref 0–1)
Eosinophils Absolute: 0.3 10*3/uL (ref 0.0–0.7)
Eosinophils Relative: 4 % (ref 0–5)
HCT: 34.9 % — ABNORMAL LOW (ref 36.0–46.0)
Hemoglobin: 11.3 g/dL — ABNORMAL LOW (ref 12.0–15.0)
LYMPHS ABS: 1.4 10*3/uL (ref 0.7–4.0)
Lymphocytes Relative: 18 % (ref 12–46)
MCH: 28.1 pg (ref 26.0–34.0)
MCHC: 32.4 g/dL (ref 30.0–36.0)
MCV: 86.8 fL (ref 78.0–100.0)
MPV: 10.1 fL (ref 8.6–12.4)
Monocytes Absolute: 0.7 10*3/uL (ref 0.1–1.0)
Monocytes Relative: 9 % (ref 3–12)
NEUTROS PCT: 69 % (ref 43–77)
Neutro Abs: 5.3 10*3/uL (ref 1.7–7.7)
Platelets: 181 10*3/uL (ref 150–400)
RBC: 4.02 MIL/uL (ref 3.87–5.11)
RDW: 15.4 % (ref 11.5–15.5)
WBC: 7.7 10*3/uL (ref 4.0–10.5)

## 2015-07-20 LAB — COMPLETE METABOLIC PANEL WITH GFR
ALT: 11 U/L (ref 6–29)
AST: 16 U/L (ref 10–35)
Albumin: 3.2 g/dL — ABNORMAL LOW (ref 3.6–5.1)
Alkaline Phosphatase: 60 U/L (ref 33–130)
BILIRUBIN TOTAL: 0.4 mg/dL (ref 0.2–1.2)
BUN: 36 mg/dL — AB (ref 7–25)
CHLORIDE: 102 mmol/L (ref 98–110)
CO2: 24 mmol/L (ref 20–31)
Calcium: 8.2 mg/dL — ABNORMAL LOW (ref 8.6–10.4)
Creat: 2.21 mg/dL — ABNORMAL HIGH (ref 0.50–0.99)
GFR, Est African American: 26 mL/min — ABNORMAL LOW (ref >=60)
GFR, Est Non African American: 23 mL/min — ABNORMAL LOW (ref >=60)
GLUCOSE: 124 mg/dL — AB (ref 65–99)
Potassium: 3.8 mmol/L (ref 3.5–5.3)
SODIUM: 140 mmol/L (ref 135–146)
TOTAL PROTEIN: 6.4 g/dL (ref 6.1–8.1)

## 2015-07-20 LAB — LIPID PANEL
CHOLESTEROL: 153 mg/dL (ref 125–200)
HDL: 63 mg/dL (ref >=46)
LDL Cholesterol: 69 mg/dL (ref <130)
TRIGLYCERIDES: 103 mg/dL (ref <150)
Total CHOL/HDL Ratio: 2.4 Ratio (ref <=5.0)
VLDL: 21 mg/dL (ref <30)

## 2015-07-21 LAB — RPR

## 2015-07-21 LAB — URINE CYTOLOGY ANCILLARY ONLY
Chlamydia: NEGATIVE
NEISSERIA GONORRHEA: NEGATIVE

## 2015-07-21 LAB — HIV-1 RNA QUANT-NO REFLEX-BLD: HIV-1 RNA Quant, Log: <1.3 {Log} (ref <1.30)

## 2015-07-22 LAB — T-HELPER CELL (CD4) - (RCID CLINIC ONLY)
CD4 % Helper T Cell: 16 % — ABNORMAL LOW (ref 33–55)
CD4 T Cell Abs: 230 /uL — ABNORMAL LOW (ref 400–2700)

## 2015-08-04 ENCOUNTER — Ambulatory Visit (INDEPENDENT_AMBULATORY_CARE_PROVIDER_SITE_OTHER): Payer: Medicare Other | Admitting: Infectious Disease

## 2015-08-04 ENCOUNTER — Encounter: Payer: Self-pay | Admitting: Infectious Disease

## 2015-08-04 VITALS — BP 146/76 | HR 64 | Temp 97.3°F | Wt 166.0 lb

## 2015-08-04 DIAGNOSIS — E1129 Type 2 diabetes mellitus with other diabetic kidney complication: Secondary | ICD-10-CM

## 2015-08-04 DIAGNOSIS — Z940 Kidney transplant status: Secondary | ICD-10-CM | POA: Diagnosis not present

## 2015-08-04 DIAGNOSIS — B20 Human immunodeficiency virus [HIV] disease: Secondary | ICD-10-CM | POA: Diagnosis present

## 2015-08-04 DIAGNOSIS — Z8619 Personal history of other infectious and parasitic diseases: Secondary | ICD-10-CM | POA: Diagnosis not present

## 2015-08-04 HISTORY — DX: Personal history of other infectious and parasitic diseases: Z86.19

## 2015-08-04 MED ORDER — LAMIVUDINE 100 MG PO TABS: 100.0000 mg | ORAL_TABLET | Freq: Every day | ORAL | Status: DC

## 2015-08-04 MED ORDER — RILPIVIRINE HCL 25 MG PO TABS: 25.0000 mg | ORAL_TABLET | Freq: Every day | ORAL | Status: DC

## 2015-08-04 MED ORDER — DOLUTEGRAVIR SODIUM 50 MG PO TABS: 50.0000 mg | ORAL_TABLET | Freq: Every day | ORAL | Status: DC

## 2015-08-04 NOTE — Progress Notes (Signed)
Subjective:    Patient ID: [name], female    DOB: 13-Sep-1946, 69 y.o.   MRN: [id number]  HPI   69 year-old woman with past medical history is significant for HIV infection, hypertension, diabetes mellitus type II, sp renal transplant at [facility]. She has been currently with perfect suppression on Sustiva and Epzicom then changed to [facility].  Her serum creatinine has continued to rise now above 2.2 with her filtration rate having drop to the 20s.  My pharmacist Minh and I had extensive discussion with her re her HIV regimen.  While her TRIUMEQ is perfectly suppressing her virus at present the LAMIVUDINE component has not been studied at dose of 300mg  below 50 or even below 30. We had previously felt comfortable treating her with 300mg  in her fixed dose tablet given data with analagous compound Emtricitabine which has been studies as part of TAF containing regimens down to 30 (and currently being studied below this cutoff). However we emphasized to the patient that treating her with 3 mg of Epivir based on her current renal function was operating an area of uncharted waters not yet informed by clinical trials. We will have her the potential risks including toxicities from excess medication including risk for lactic acidosis or other side effects that she might be at risk for she continue recurrent fixed dose tablet with 300 mg of Epivir versus changing her to a multi pill regimen. Ultimately she opted for the latter    Review of Systems  Constitutional: Negative for fever, chills, diaphoresis, activity change, appetite change, fatigue and unexpected weight change.  HENT: Negative for congestion, rhinorrhea, sinus pressure, sneezing, sore throat and trouble swallowing.   Eyes: Negative for photophobia and visual disturbance.  Respiratory: Negative for cough, chest tightness, shortness of breath, wheezing and stridor.   Cardiovascular: Negative for chest pain, palpitations and leg swelling.   Gastrointestinal: Negative for nausea, vomiting, abdominal pain, diarrhea, constipation, blood in stool, abdominal distention and anal bleeding.  Genitourinary: Negative for dysuria, hematuria and difficulty urinating.  Musculoskeletal: Negative for myalgias, back pain, joint swelling and gait problem.  Skin: Negative for color change, pallor, rash and wound.  Neurological: Negative for dizziness, tremors, weakness and light-headedness.  Hematological: Negative for adenopathy. Does not bruise/bleed easily.  Psychiatric/Behavioral: Negative for behavioral problems, confusion, sleep disturbance, dysphoric mood, decreased concentration and agitation.       Objective:   Physical Exam  Constitutional: She is oriented to person, place, and time. She appears well-developed and well-nourished. No distress.  HENT:  Head: Normocephalic and atraumatic.  Mouth/Throat: Oropharynx is clear and moist. No oropharyngeal exudate.  Eyes: Conjunctivae and EOM are normal. No scleral icterus.  Neck: Normal range of motion. Neck supple. No JVD present.  Cardiovascular: Normal rate, regular rhythm and normal heart sounds.   Pulmonary/Chest: Effort normal. No respiratory distress. She has no wheezes.  Abdominal: She exhibits no distension.  Musculoskeletal: She exhibits no edema or tenderness.  Lymphadenopathy:    She has no cervical adenopathy.  Neurological: She is alert and oriented to person, place, and time. Coordination normal.  Skin: Skin is warm and dry. She is not diaphoretic. No erythema. No pallor.  Psychiatric: She has a normal mood and affect. Her behavior is normal. Judgment and thought content normal.          Assessment & Plan:   HIV:   Switch her to Tivicay 50mg  tablet with EDURANT 25mg  tablet and Epivir 100mg  tablet ALL TO BE TAKEN WITH 400  calorie + meal with fat in it. She is to [place]. SHE IS NOT TO TAKE MAGNESIUM AT SAME TIME AS MEDS.   Hepatitis C: genotype 1b,:  cured  Renal transplantation: being followed VERY closely at [facility], cr up slightly. Exhorted her to receive flu vaccine today --which she initially had refused   DM: being followed by Endocrine, and Dr Hayes Ludwig here in [facility] at [facility].   I spent greater than 40 minutes with the patient including greater than 50% of time in face to face counsel of the patient re risks and benefits of her current renal tablet regimen versus a multi tablet regimen that was better adjusted  to her decreased renal function and in counseling her to receive influenza vaccine and continued to be followed closely by renal transplant by primary care and endocrine and in coordination of their care.  Lavell Islam. Tommy Medal, [place] [facility]

## 2015-08-31 ENCOUNTER — Other Ambulatory Visit: Payer: Medicare Other

## 2015-09-13 ENCOUNTER — Other Ambulatory Visit: Payer: Medicare Other

## 2015-09-14 ENCOUNTER — Other Ambulatory Visit: Payer: Medicare Other

## 2015-09-15 ENCOUNTER — Ambulatory Visit: Payer: Medicare Other | Admitting: Infectious Disease

## 2015-09-16 ENCOUNTER — Ambulatory Visit (INDEPENDENT_AMBULATORY_CARE_PROVIDER_SITE_OTHER): Payer: Medicare Other | Admitting: Internal Medicine

## 2015-09-16 ENCOUNTER — Encounter: Payer: Self-pay | Admitting: Internal Medicine

## 2015-09-16 VITALS — BP 155/77 | HR 65 | Temp 97.9°F | Ht 62.0 in | Wt 170.3 lb

## 2015-09-16 DIAGNOSIS — M546 Pain in thoracic spine: Secondary | ICD-10-CM | POA: Diagnosis not present

## 2015-09-16 DIAGNOSIS — M549 Dorsalgia, unspecified: Secondary | ICD-10-CM | POA: Insufficient documentation

## 2015-09-16 DIAGNOSIS — I10 Essential (primary) hypertension: Secondary | ICD-10-CM

## 2015-09-16 DIAGNOSIS — E1129 Type 2 diabetes mellitus with other diabetic kidney complication: Secondary | ICD-10-CM

## 2015-09-16 LAB — GLUCOSE, CAPILLARY: GLUCOSE-CAPILLARY: 123 mg/dL — AB (ref 65–99)

## 2015-09-16 LAB — POCT GLYCOSYLATED HEMOGLOBIN (HGB A1C): Hemoglobin A1C: 6.7

## 2015-09-16 NOTE — Patient Instructions (Signed)
Mrs. Bobbitt,  Thank you for coming in today to see [place]. You have been doing a great job with controlling your diabetes, continue to eat well and exercise regularly. Continue taking the 14 units in the morning and 14 at night.  As for your back and shoulder pain, continue taking the acetaminophen (Tylenol) as needed. You can take more Tylenol per day than you are now - try taking 3 pills at night or maybe 1 in the morning and 2 at night as needed. We believe that this pain is most likely due to strained muscles and may respond well to things like over-the-counter topical agents such as Tiger Balm (or generic equivalent), massage, Icy hot, or heating pads. Please let us know if this isn't working, and we can figure out a better plan from there.  Come back and see [place] in 3 months, or sooner if needed.  Thanks, Blane Ohara

## 2015-09-16 NOTE — Assessment & Plan Note (Addendum)
Lab Results  Component Value Date   HGBA1C 6.7 09/16/2015   HGBA1C 6.3 06/10/2015   HGBA1C 6.9 03/01/2015     Assessment: Diabetes control:  well-controlled Progress toward A1C goal:   at goal Comments: currently on novo 70/30, was decreased to 14 units AM and 14 units PM at last visit (from 16 + 18). Morning glucoses run in 110s, nighttime glucoses run 130s-140s. Lowest readings are in the 90s.  Plan: Medications:  continue current medications Home glucose monitoring: Frequency:   Timing:   Instruction/counseling given: reminded to bring blood glucose meter & log to each visit, reminded to bring medications to each visit, discussed foot care, discussed the need for weight loss and discussed diet Educational resources provided:   Self management tools provided:   Other plans: Follow-up in 3 months

## 2015-09-16 NOTE — Assessment & Plan Note (Signed)
BP Readings from Last 3 Encounters:  09/16/15 155/77  08/04/15 146/76  06/10/15 150/60    Lab Results  Component Value Date   NA 140 07/20/2015   K 3.8 07/20/2015   CREATININE 2.21* 07/20/2015    Assessment: Blood pressure control:  poorly controlled Progress toward BP goal:   near goal of 140/80 Comments: currently on losartan 100mg  QD, furosemide 40mg  BID, and hydralazine 25mg  TID  Plan: Medications:  continue current medications Educational resources provided:   Self management tools provided:   Other plans: counseled patient on continued exercise and low-sodium diet, as she has been trying to adhere to. Also in significant pain currently which may be contributing to the pain. Will wait to change medications until pain is better managed and patient continued with controlling her hypertension and diabetes with diet. Will re-check in 3 months.

## 2015-09-16 NOTE — Assessment & Plan Note (Signed)
Most likely musculoligamentous strain, with significant knot in left upper back which reproduces pain with palpation. -Counseled patient to continue taking tylenol as NSAIDs are to be avoided in her case, told her to increase dose to 1500mg  (3 x 500mg  tablets PRN) as she only takes 1000mg  approx 3 times/week. -Instructed patient on other conservative measures, including heating pads, topical creams, massage, or even changing pillows at night -Will follow-up in 3 months

## 2015-09-16 NOTE — Progress Notes (Signed)
   Subjective:    Patient ID: [name], female    DOB: [birth year], 69 y.o.   MRN: [id number]  HPI [name] is a 69 y.o. with PMH of HTN, DM2, HIV perfectly suppressed, HCV (cured), and renal transplant (09/2012) followed by [facility] who presents to [facility] today for routine follow-up. For the past month, she says that she has had constant burning pain across her entire upper back as well as in her left shoulder. She says pain is worse with moving her left arm, and was severe enough yesterday that she was unable to lift her arm above 90^ due to pain. Nothing else seems to worsen the pain, including neck movement or other activities. She takes 2 tylenol 500mg  tabs at night roughly 3-4 times per week when the pain is bad enough. She has not tried any other conservative measures.  She denies any neurologic symptoms, trauma, or constitutional symptoms. She has no other new issues at this time.  Please see problem-based charting for further pertinent information.    Review of Systems  Constitutional: Negative for fever, chills, diaphoresis and unexpected weight change.  HENT: Negative for ear pain, sinus pressure and sore throat.   Eyes: Negative for visual disturbance.  Respiratory: Negative for cough, shortness of breath and wheezing.   Cardiovascular: Negative for chest pain, palpitations and leg swelling.  Gastrointestinal: Negative for nausea, vomiting, abdominal pain, diarrhea, constipation and blood in stool.  Endocrine: Negative for polyuria.  Genitourinary: Negative for dysuria, urgency, frequency, flank pain, decreased urine volume and difficulty urinating.  Musculoskeletal: Positive for myalgias, back pain and arthralgias. Negative for gait problem and neck pain.  Skin: Negative for rash.  Neurological: Negative for dizziness, syncope, weakness, numbness and headaches.  Hematological: Does not bruise/bleed easily.  Psychiatric/Behavioral: Negative for confusion and agitation.    All other systems reviewed and are negative.      Objective:   Physical Exam  Constitutional: She is oriented to person, place, and time. She appears well-nourished. No distress.  HENT:  Head: Normocephalic and atraumatic.  Right Ear: External ear normal.  Left Ear: External ear normal.  Eyes: Conjunctivae are normal. Pupils are equal, round, and reactive to light.  Neck: Normal range of motion. Neck supple.  Cardiovascular: Normal rate, regular rhythm, normal heart sounds and intact distal pulses.  Exam reveals no gallop and no friction rub.   No murmur heard. Pulmonary/Chest: Effort normal and breath sounds normal. She has no wheezes. She has no rales.  Abdominal: Soft. Bowel sounds are normal. She exhibits no distension. There is no tenderness.  Musculoskeletal: Normal range of motion. She exhibits tenderness. She exhibits no edema.  There is a ~4cm knot along her right medial scapula, tender to palpation. No other tenderness along vertebrae, or shoulder joints, or any other regions. Neck and back ROM intact. Moderate pain with active ROM with left shoulder abduction, otherwise normal ROM. R shoulder ROM normal without pain Neurovascular exam intact.  Neurological: She is alert and oriented to person, place, and time. She has normal reflexes. No cranial nerve deficit. Coordination normal.  Skin: Skin is warm. No rash noted. She is not diaphoretic. No erythema.  Psychiatric: She has a normal mood and affect. Her behavior is normal.  Nursing note and vitals reviewed.         Assessment & Plan:  Please see problem-based charting for assessment and plan.  Blane Ohara, MD Resident Physician, PGY-1 Department of Internal Medicine [facility]

## 2015-09-17 ENCOUNTER — Encounter: Payer: Self-pay | Admitting: Pharmacist

## 2015-09-17 ENCOUNTER — Other Ambulatory Visit: Payer: Medicare Other

## 2015-09-17 DIAGNOSIS — Z940 Kidney transplant status: Secondary | ICD-10-CM

## 2015-09-17 DIAGNOSIS — B20 Human immunodeficiency virus [HIV] disease: Secondary | ICD-10-CM

## 2015-09-17 LAB — COMPLETE METABOLIC PANEL WITH GFR
ALT: 13 U/L (ref 6–29)
AST: 19 U/L (ref 10–35)
Albumin: 3.5 g/dL — ABNORMAL LOW (ref 3.6–5.1)
Alkaline Phosphatase: 58 U/L (ref 33–130)
BUN: 30 mg/dL — ABNORMAL HIGH (ref 7–25)
CO2: 25 mmol/L (ref 20–31)
Calcium: 9.2 mg/dL (ref 8.6–10.4)
Chloride: 103 mmol/L (ref 98–110)
Creat: 2.34 mg/dL — ABNORMAL HIGH (ref 0.50–0.99)
GFR, Est African American: 24 mL/min — ABNORMAL LOW (ref >=60)
GFR, Est Non African American: 21 mL/min — ABNORMAL LOW (ref >=60)
Glucose, Bld: 107 mg/dL — ABNORMAL HIGH (ref 65–99)
Potassium: 3.7 mmol/L (ref 3.5–5.3)
Sodium: 138 mmol/L (ref 135–146)
Total Bilirubin: 0.5 mg/dL (ref 0.2–1.2)
Total Protein: 6.4 g/dL (ref 6.1–8.1)

## 2015-09-17 LAB — CBC WITH DIFFERENTIAL/PLATELET
BASOS ABS: 0 10*3/uL (ref 0.0–0.1)
Basophils Relative: 0 % (ref 0–1)
EOS ABS: 0.4 10*3/uL (ref 0.0–0.7)
Eosinophils Relative: 5 % (ref 0–5)
HCT: 33.1 % — ABNORMAL LOW (ref 36.0–46.0)
Hemoglobin: 10.9 g/dL — ABNORMAL LOW (ref 12.0–15.0)
LYMPHS ABS: 1.3 10*3/uL (ref 0.7–4.0)
LYMPHS PCT: 19 % (ref 12–46)
MCH: 29.1 pg (ref 26.0–34.0)
MCHC: 32.9 g/dL (ref 30.0–36.0)
MCV: 88.5 fL (ref 78.0–100.0)
MPV: 9.5 fL (ref 8.6–12.4)
Monocytes Absolute: 0.6 10*3/uL (ref 0.1–1.0)
Monocytes Relative: 9 % (ref 3–12)
NEUTROS PCT: 67 % (ref 43–77)
Neutro Abs: 4.8 10*3/uL (ref 1.7–7.7)
PLATELETS: 153 10*3/uL (ref 150–400)
RBC: 3.74 MIL/uL — AB (ref 3.87–5.11)
RDW: 14.6 % (ref 11.5–15.5)
WBC: 7.1 10*3/uL (ref 4.0–10.5)

## 2015-09-17 LAB — T-HELPER CELL (CD4) - (RCID CLINIC ONLY)
CD4 % Helper T Cell: 16 % — ABNORMAL LOW (ref 33–55)
CD4 T Cell Abs: 240 /uL — ABNORMAL LOW (ref 400–2700)

## 2015-09-21 LAB — HIV-1 RNA QUANT-NO REFLEX-BLD
HIV 1 RNA Quant: <20 copies/mL (ref <20)
HIV-1 RNA Quant, Log: <1.3 Log copies/mL (ref <1.30)

## 2015-09-27 DIAGNOSIS — B182 Chronic viral hepatitis C: Secondary | ICD-10-CM | POA: Diagnosis not present

## 2015-09-27 DIAGNOSIS — I129 Hypertensive chronic kidney disease with stage 1 through stage 4 chronic kidney disease, or unspecified chronic kidney disease: Secondary | ICD-10-CM | POA: Diagnosis not present

## 2015-09-27 DIAGNOSIS — N184 Chronic kidney disease, stage 4 (severe): Secondary | ICD-10-CM | POA: Diagnosis not present

## 2015-09-27 DIAGNOSIS — E118 Type 2 diabetes mellitus with unspecified complications: Secondary | ICD-10-CM | POA: Diagnosis not present

## 2015-09-27 DIAGNOSIS — E782 Mixed hyperlipidemia: Secondary | ICD-10-CM | POA: Diagnosis not present

## 2015-09-27 DIAGNOSIS — N2581 Secondary hyperparathyroidism of renal origin: Secondary | ICD-10-CM | POA: Diagnosis not present

## 2015-09-27 DIAGNOSIS — Z940 Kidney transplant status: Secondary | ICD-10-CM | POA: Diagnosis not present

## 2015-09-27 DIAGNOSIS — Z21 Asymptomatic human immunodeficiency virus [HIV] infection status: Secondary | ICD-10-CM | POA: Diagnosis not present

## 2015-09-27 DIAGNOSIS — R809 Proteinuria, unspecified: Secondary | ICD-10-CM | POA: Diagnosis not present

## 2015-09-27 DIAGNOSIS — D631 Anemia in chronic kidney disease: Secondary | ICD-10-CM | POA: Diagnosis not present

## 2015-09-27 DIAGNOSIS — N183 Chronic kidney disease, stage 3 (moderate): Secondary | ICD-10-CM | POA: Diagnosis not present

## 2015-09-27 DIAGNOSIS — R635 Abnormal weight gain: Secondary | ICD-10-CM | POA: Diagnosis not present

## 2015-10-06 ENCOUNTER — Encounter: Payer: Self-pay | Admitting: Infectious Disease

## 2015-10-06 ENCOUNTER — Ambulatory Visit (INDEPENDENT_AMBULATORY_CARE_PROVIDER_SITE_OTHER): Payer: Medicare Other | Admitting: Infectious Disease

## 2015-10-06 VITALS — BP 161/69 | HR 72 | Temp 97.4°F | Wt 168.0 lb

## 2015-10-06 DIAGNOSIS — I10 Essential (primary) hypertension: Secondary | ICD-10-CM

## 2015-10-06 DIAGNOSIS — N059 Unspecified nephritic syndrome with unspecified morphologic changes: Secondary | ICD-10-CM

## 2015-10-06 DIAGNOSIS — B20 Human immunodeficiency virus [HIV] disease: Secondary | ICD-10-CM | POA: Diagnosis not present

## 2015-10-06 DIAGNOSIS — Z940 Kidney transplant status: Secondary | ICD-10-CM | POA: Diagnosis not present

## 2015-10-06 NOTE — Progress Notes (Signed)
Subjective:    Patient ID: [name], female    DOB: 08-06-1946, 69 y.o.   MRN: [id number]  HPI   69 year-old woman with past medical history is significant for HIV infection, hypertension, diabetes mellitus type II, sp renal transplant at [facility]. She has been currently with perfect suppression on Sustiva and Epzicom then changed to [facility].  Her serum creatinine had continued to rise now above 2.2 with her filtration rate having drop to the 20s.  My pharmacist Minh and I had extensive discussion with her re her HIV regimen.  While her TRIUMEQ is perfectly suppressing her virus at present the LAMIVUDINE component has not been studied at dose of 300mg  below 50 or even below 30. We had previously felt comfortable treating her with 300mg  in her fixed dose tablet given data with analagous compound Emtricitabine which has been studies as part of TAF containing regimens down to 30 (and currently being studied below this cutoff).  We eventually opted to change her to Tivicay, Rilpivirine and lamivudine.  While she HAS DEFINITELY stopped her PPI, and she has been taking her meds with meals she did not understand that it needed to be food that is chewable and that contains fat in it.   Past Medical History  Diagnosis Date  . HIV infection ([place])     Dx in 1998 in [place], she presented with PCP pneumonia at that time. Has been tried on multiple regimens by her PCP in [place] before./ She has been on current ART for years now. Moved to [facility] in 2008 and is following with Dr. Tommy Medal since then.   . Hepatitis C     untreated. VL [id number] in 2008  . Diabetes mellitus     Las HbA1C   . Hypertension   . Chronic kidney disease     ESRD secondary to DM, started HD in 2008, HD TTS, Dr. Jimmy Footman is her nephrologist, on transplant list at [facility].  . Dialysis patient ([place])     T Th Sat  . History of hepatitis C 08/04/2015    Past Surgical History  Procedure Laterality Date  . Abdominal hysterectomy      and  oopherectomy for fibroids  . Cholecystectomy    . Kidney transplant      Family History  Problem Relation Age of Onset  . Diabetes Mother   . Cancer Father   . Cancer Brother       Social History   Social History  . Marital Status: Single    Spouse Name: N/A  . Number of Children: N/A  . Years of Education: N/A   Social History Main Topics  . Smoking status: Former Smoker    Quit date: 05/22/1997  . Smokeless tobacco: Never Used  . Alcohol Use: No  . Drug Use: No  . Sexual Activity: Not Asked   Other Topics Concern  . None   Social History Narrative    Allergies  Allergen Reactions  . Omeprazole Other (See Comments)    Interferes with the absorption of rilipivirine  . Influenza Vaccines Other (See Comments)    Other reaction(s): Hallucination  . Influenza Virus Vacc Split Pf     Alleged severe illness in [place]. Do not have documentation from [place] but pt always refuses vaccine on these grounds     Current outpatient prescriptions:  .  amLODipine (NORVASC) 5 MG tablet, Take 1 tablet (5 mg total) by mouth daily., Disp: 30 tablet, Rfl: 12 .  aspirin 81 MG EC tablet, Take 81 mg by mouth daily.  , Disp: , Rfl:  .  carvedilol (COREG) 6.25 MG tablet, Take 1 tablet (6.25 mg total) by mouth 2 (two) times daily with a meal., Disp: 60 tablet, Rfl: 12 .  cinacalcet (SENSIPAR) 60 MG tablet, Take 1 tablet (60 mg total) by mouth daily., Disp: 30 tablet, Rfl: 12 .  dolutegravir (TIVICAY) 50 MG tablet, Take 1 tablet (50 mg total) by mouth daily., Disp: 30 tablet, Rfl: 11 .  fluconazole (DIFLUCAN) 150 MG tablet, Take one tablet by mouth today and another tablet after 72 hours., Disp: 2 tablet, Rfl: 0 .  FREESTYLE TEST STRIPS test strip, , Disp: , Rfl: 9 .  furosemide (LASIX) 40 MG tablet, Take 80 mg by mouth daily. , Disp: , Rfl: 0 .  hydrALAZINE (APRESOLINE) 25 MG tablet, Take 1 tablet (25 mg total) by mouth 3 (three) times daily., Disp: 90 tablet, Rfl: 12 .  insulin  NPH-regular Human (NOVOLIN 70/30) (70-30) 100 UNIT/ML injection, Inject 13 units into the skin before breakfast and 13 units before dinner, Disp: 10 mL, Rfl: 12 .  Insulin Syringe-Needle U-100 31G X 15/64" 0.3 ML MISC, Sig: Use to Inject insulin twice a day, Disp: 100 each, Rfl: 5 .  lamivudine (EPIVIR) 100 MG tablet, Take 1 tablet (100 mg total) by mouth daily., Disp: 30 tablet, Rfl: 11 .  losartan (COZAAR) 100 MG tablet, Take 100 mg by mouth daily., Disp: , Rfl: 1 .  magnesium oxide (MAG-OX) 400 MG tablet, Take 400 mg by mouth 2 (two) times daily., Disp: , Rfl:  .  mycophenolate (MYFORTIC) 180 MG EC tablet, Take 3 tablets (540 mg total) by mouth 2 (two) times daily., Disp: , Rfl:  .  PATADAY 0.2 % SOLN, , Disp: , Rfl: 0 .  rilpivirine (EDURANT) 25 MG TABS tablet, Take 1 tablet (25 mg total) by mouth daily with breakfast., Disp: 30 tablet, Rfl: 11 .  rosuvastatin (CRESTOR) 20 MG tablet, Take 1 tablet (20 mg total) by mouth daily., Disp: 90 tablet, Rfl: 3 .  senna-docusate (SENOKOT-S) 8.6-50 MG per tablet, Take 2 tablets by mouth 2 (two) times daily as needed for constipation., Disp: 60 tablet, Rfl: 12 .  tacrolimus (PROGRAF) 1 MG capsule, Take 5 mg ( 5 pills) in AM and 4 mg ( 4 pills) in PM--V42.0, Disp: , Rfl:      Review of Systems  Constitutional: Negative for fever, chills, diaphoresis, activity change, appetite change, fatigue and unexpected weight change.  HENT: Negative for congestion, rhinorrhea, sinus pressure, sneezing, sore throat and trouble swallowing.   Eyes: Negative for photophobia and visual disturbance.  Respiratory: Negative for cough, chest tightness, shortness of breath, wheezing and stridor.   Cardiovascular: Negative for chest pain, palpitations and leg swelling.  Gastrointestinal: Negative for nausea, vomiting, abdominal pain, diarrhea, constipation, blood in stool, abdominal distention and anal bleeding.  Genitourinary: Negative for dysuria, hematuria and difficulty  urinating.  Musculoskeletal: Negative for myalgias, back pain, joint swelling and gait problem.  Skin: Negative for color change, pallor, rash and wound.  Neurological: Negative for dizziness, tremors, weakness and light-headedness.  Hematological: Negative for adenopathy. Does not bruise/bleed easily.  Psychiatric/Behavioral: Negative for behavioral problems, confusion, sleep disturbance, dysphoric mood, decreased concentration and agitation.       Objective:   Physical Exam  Constitutional: She is oriented to person, place, and time. She appears well-developed and well-nourished. No distress.  HENT:  Head: Normocephalic and atraumatic.  Mouth/Throat:  Oropharynx is clear and moist. No oropharyngeal exudate.  Eyes: Conjunctivae and EOM are normal. No scleral icterus.  Neck: Normal range of motion. Neck supple. No JVD present.  Cardiovascular: Normal rate, regular rhythm and normal heart sounds.   Pulmonary/Chest: Effort normal. No respiratory distress. She has no wheezes.  Abdominal: She exhibits no distension.  Musculoskeletal: She exhibits no edema or tenderness.  Lymphadenopathy:    She has no cervical adenopathy.  Neurological: She is alert and oriented to person, place, and time. Coordination normal.  Skin: Skin is warm and dry. She is not diaphoretic. No erythema. No pallor.  Psychiatric: She has a normal mood and affect. Her behavior is normal. Judgment and thought content normal.          Assessment & Plan:   HIV:   Continue Tivicay 50mg  tablet with EDURANT 25mg  tablet and Epivir 100mg  tablet ALL TO BE TAKEN WITH 400 calorie + meal with fat in it. She is to [place]. SHE IS NOT TO TAKE MAGNESIUM AT SAME TIME AS MEDS.   Renal transplantation: being followed VERY closely at [facility], and by Nephrology here.  cr up slightly.    DM: being followed by Endocrine, and Dr Hayes Ludwig here in [facility].  HTN: Poorly controlled in our clinic. She claims BP better in  130s when she visits Nephrology. All bp in Epic are excessively high and I worry this will lead to her transplanted kidney failing. Exhorted her to followup with PCP and to improve BP control  I spent greater than 40 minutes with the patient including greater than 50% of time in face to face counsel of the patient re her HIV, her ARV regimen, need for better BP control and in  coordination of their care.  Lavell Islam. Tommy Medal, [place] for Infectious Diseases

## 2015-10-07 DIAGNOSIS — Z794 Long term (current) use of insulin: Secondary | ICD-10-CM | POA: Diagnosis not present

## 2015-10-07 DIAGNOSIS — Z940 Kidney transplant status: Secondary | ICD-10-CM | POA: Diagnosis not present

## 2015-10-07 DIAGNOSIS — Z21 Asymptomatic human immunodeficiency virus [HIV] infection status: Secondary | ICD-10-CM | POA: Diagnosis not present

## 2015-10-07 DIAGNOSIS — E1121 Type 2 diabetes mellitus with diabetic nephropathy: Secondary | ICD-10-CM | POA: Diagnosis not present

## 2015-10-07 DIAGNOSIS — N2581 Secondary hyperparathyroidism of renal origin: Secondary | ICD-10-CM | POA: Diagnosis not present

## 2015-10-07 DIAGNOSIS — Z4822 Encounter for aftercare following kidney transplant: Secondary | ICD-10-CM | POA: Diagnosis not present

## 2015-11-02 NOTE — Progress Notes (Signed)
Internal Medicine Clinic Attending  I saw and evaluated the patient.  I personally confirmed the key portions of the history and exam documented by Dr. Kennedy and I reviewed pertinent patient test results.  The assessment, diagnosis, and plan were formulated together and I agree with the documentation in the resident's note.  

## 2015-11-30 ENCOUNTER — Other Ambulatory Visit: Payer: Self-pay | Admitting: *Deleted

## 2015-11-30 DIAGNOSIS — E785 Hyperlipidemia, unspecified: Secondary | ICD-10-CM

## 2015-12-01 MED ORDER — CARVEDILOL 6.25 MG PO TABS
6.2500 mg | ORAL_TABLET | Freq: Two times a day (BID) | ORAL | Status: DC
Start: 2015-12-01 — End: 2016-11-17

## 2015-12-08 DIAGNOSIS — Z1231 Encounter for screening mammogram for malignant neoplasm of breast: Secondary | ICD-10-CM | POA: Diagnosis not present

## 2015-12-08 DIAGNOSIS — Z6830 Body mass index (BMI) 30.0-30.9, adult: Secondary | ICD-10-CM | POA: Diagnosis not present

## 2015-12-08 DIAGNOSIS — N958 Other specified menopausal and perimenopausal disorders: Secondary | ICD-10-CM | POA: Diagnosis not present

## 2015-12-08 DIAGNOSIS — Z124 Encounter for screening for malignant neoplasm of cervix: Secondary | ICD-10-CM | POA: Diagnosis not present

## 2015-12-08 DIAGNOSIS — M8588 Other specified disorders of bone density and structure, other site: Secondary | ICD-10-CM | POA: Diagnosis not present

## 2015-12-27 DIAGNOSIS — E782 Mixed hyperlipidemia: Secondary | ICD-10-CM | POA: Diagnosis not present

## 2015-12-27 DIAGNOSIS — R635 Abnormal weight gain: Secondary | ICD-10-CM | POA: Diagnosis not present

## 2015-12-27 DIAGNOSIS — B182 Chronic viral hepatitis C: Secondary | ICD-10-CM | POA: Diagnosis not present

## 2015-12-27 DIAGNOSIS — E118 Type 2 diabetes mellitus with unspecified complications: Secondary | ICD-10-CM | POA: Diagnosis not present

## 2015-12-27 DIAGNOSIS — Z940 Kidney transplant status: Secondary | ICD-10-CM | POA: Diagnosis not present

## 2015-12-27 DIAGNOSIS — N184 Chronic kidney disease, stage 4 (severe): Secondary | ICD-10-CM | POA: Diagnosis not present

## 2015-12-27 DIAGNOSIS — R809 Proteinuria, unspecified: Secondary | ICD-10-CM | POA: Diagnosis not present

## 2015-12-27 DIAGNOSIS — D631 Anemia in chronic kidney disease: Secondary | ICD-10-CM | POA: Diagnosis not present

## 2015-12-27 DIAGNOSIS — N2581 Secondary hyperparathyroidism of renal origin: Secondary | ICD-10-CM | POA: Diagnosis not present

## 2015-12-27 DIAGNOSIS — I129 Hypertensive chronic kidney disease with stage 1 through stage 4 chronic kidney disease, or unspecified chronic kidney disease: Secondary | ICD-10-CM | POA: Diagnosis not present

## 2015-12-27 DIAGNOSIS — Z21 Asymptomatic human immunodeficiency virus [HIV] infection status: Secondary | ICD-10-CM | POA: Diagnosis not present

## 2016-01-11 DIAGNOSIS — I129 Hypertensive chronic kidney disease with stage 1 through stage 4 chronic kidney disease, or unspecified chronic kidney disease: Secondary | ICD-10-CM | POA: Diagnosis not present

## 2016-01-20 ENCOUNTER — Ambulatory Visit (INDEPENDENT_AMBULATORY_CARE_PROVIDER_SITE_OTHER): Payer: Medicare Other | Admitting: Internal Medicine

## 2016-01-20 ENCOUNTER — Encounter: Payer: Self-pay | Admitting: Internal Medicine

## 2016-01-20 VITALS — BP 153/70 | HR 61 | Temp 98.0°F | Ht 62.0 in | Wt 166.8 lb

## 2016-01-20 DIAGNOSIS — E118 Type 2 diabetes mellitus with unspecified complications: Secondary | ICD-10-CM

## 2016-01-20 DIAGNOSIS — E119 Type 2 diabetes mellitus without complications: Secondary | ICD-10-CM | POA: Diagnosis not present

## 2016-01-20 DIAGNOSIS — I10 Essential (primary) hypertension: Secondary | ICD-10-CM | POA: Diagnosis present

## 2016-01-20 DIAGNOSIS — J302 Other seasonal allergic rhinitis: Secondary | ICD-10-CM | POA: Diagnosis not present

## 2016-01-20 DIAGNOSIS — Z794 Long term (current) use of insulin: Secondary | ICD-10-CM

## 2016-01-20 DIAGNOSIS — E1121 Type 2 diabetes mellitus with diabetic nephropathy: Secondary | ICD-10-CM

## 2016-01-20 LAB — GLUCOSE, CAPILLARY: GLUCOSE-CAPILLARY: 199 mg/dL — AB (ref 65–99)

## 2016-01-20 LAB — POCT GLYCOSYLATED HEMOGLOBIN (HGB A1C): Hemoglobin A1C: 8

## 2016-01-20 MED ORDER — INSULIN NPH ISOPHANE & REGULAR (70-30) 100 UNIT/ML ~~LOC~~ SUSP: SUBCUTANEOUS | Status: DC

## 2016-01-20 NOTE — Assessment & Plan Note (Signed)
A: Recurrent mild symptoms for the past 1 1/2 months P: Counseled on good, affordable OTC meds that are not c/i in renal disease/hepatic disease, such as 2nd gen antihistamines. Pt agrees to cetirizine or loratidine

## 2016-01-20 NOTE — Assessment & Plan Note (Signed)
BP Readings from Last 3 Encounters:  01/20/16 153/70  10/06/15 161/69  09/16/15 155/77    Lab Results  Component Value Date   NA 138 09/17/2015   K 3.7 09/17/2015   CREATININE 2.34* 09/17/2015    Assessment: Blood pressure control:  uncontrolled Progress toward BP goal:   Near-goal Comments: Recently taken off of hydralazine 25 TID by her nephrologists at [facility]; on losartan 100mg , furosemide 40 bid, amlodipine 5mg , carvedilol 6.25 BID  Plan: Medications:  continue current medications Educational resources provided: handout Self management tools provided:   Other plans: Consider increase to amlodipine 10mg  at next visit if still elevated; will also appreciate nephrology recs

## 2016-01-20 NOTE — Progress Notes (Signed)
   Patient ID: [name] female   DOB: 03/24/1946 70 y.o.   MRN: [id number]  Subjective:   HPI: [name] is a 70 y.o. with PMH of HTN, DM2, HIV perfectly suppressed, HCV (cured), and renal transplant (09/2012) followed by [facility] who presents to [facility] today for routine follow-up. She complains of mild seasonal allergies and asks about OTC medicines that are OK to take with her liver and renal dysfunction but otherwise has no complaints.  Please see problem-based charting for status of medical issues pertinent to this visit.  Review of Systems: Pertinent items noted in HPI and remainder of comprehensive ROS otherwise negative.  Objective:  Physical Exam: Filed Vitals:   01/20/16 1329  BP: 163/72  Pulse: 65  Temp: 98 F (36.7 C)  TempSrc: Oral  Height: 5\' 2"  (1.575 m)  Weight: 166 lb 12.8 oz (75.66 kg)  SpO2: 100%   Gen: Well-appearing, alert and oriented to person, place, and time HEENT: Oropharynx clear without erythema or exudate.  Neck: No cervical LAD, no thyromegaly or nodules, no JVD noted. CV: Normal rate, regular rhythm, no murmurs, rubs, or gallops Pulmonary: Normal effort, CTA bilaterally, no wheezing, rales, or rhonchi Abdominal: Soft, non-tender, non-distended, without rebound, guarding, or masses Extremities: Distal pulses 2+ in upper and lower extremities bilaterally, no tenderness, erythema or edema. Small 2cm knot along R medial scapula, previously-documented, tender to palpation. No other tenderness, intact ROM, neurovascular exam intact. Skin: No atypical appearing moles. No rashes  Assessment & Plan:  Please see problem-based charting for assessment and plan.  Blane Ohara, MD Resident Physician, PGY-1 Department of Internal Medicine [facility]

## 2016-01-20 NOTE — Assessment & Plan Note (Signed)
Lab Results  Component Value Date   HGBA1C 8.0 01/20/2016   HGBA1C 6.7 09/16/2015   HGBA1C 6.3 06/10/2015     Assessment: Diabetes control:  near control Progress toward A1C goal:   somewhat deteriorated Comments: On novo 70/30 14 + 14 AM/PM. Lowest readings are 60s, highest 230s. Has been eating more junk again recently due to holidays  Plan: Medications:  Increase to 16U and 16U Home glucose monitoring: Frequency:  QD Timing:   Instruction/counseling given: reminded to get eye exam, reminded to bring blood glucose meter & log to each visit, reminded to bring medications to each visit, discussed foot care and discussed diet Educational resources provided: handout Self management tools provided: copy of home glucose meter download Other plans: F/u in 14m

## 2016-01-21 NOTE — Progress Notes (Signed)
Internal Medicine Clinic Attending  Case discussed with Dr. Kennedy at the time of the visit.  We reviewed the resident's history and exam and pertinent patient test results.  I agree with the assessment, diagnosis, and plan of care documented in the resident's note.  

## 2016-01-26 ENCOUNTER — Other Ambulatory Visit: Payer: Medicare Other

## 2016-01-26 ENCOUNTER — Other Ambulatory Visit (HOSPITAL_COMMUNITY)
Admission: RE | Admit: 2016-01-26 | Discharge: 2016-01-26 | Disposition: A | Discharge status: Home / Self Care | Payer: Medicare Other | Source: Ambulatory Visit | Attending: Infectious Disease | Admitting: Infectious Disease

## 2016-01-26 DIAGNOSIS — B20 Human immunodeficiency virus [HIV] disease: Secondary | ICD-10-CM | POA: Diagnosis not present

## 2016-01-26 DIAGNOSIS — Z113 Encounter for screening for infections with a predominantly sexual mode of transmission: Secondary | ICD-10-CM | POA: Diagnosis not present

## 2016-01-26 LAB — CBC WITH DIFFERENTIAL/PLATELET
BASOS PCT: 0 % (ref 0–1)
Basophils Absolute: 0 10*3/uL (ref 0.0–0.1)
EOS ABS: 0.3 10*3/uL (ref 0.0–0.7)
Eosinophils Relative: 3 % (ref 0–5)
HCT: 34.1 % — ABNORMAL LOW (ref 36.0–46.0)
Hemoglobin: 11.1 g/dL — ABNORMAL LOW (ref 12.0–15.0)
Lymphocytes Relative: 15 % (ref 12–46)
Lymphs Abs: 1.4 10*3/uL (ref 0.7–4.0)
MCH: 28 pg (ref 26.0–34.0)
MCHC: 32.6 g/dL (ref 30.0–36.0)
MCV: 85.9 fL (ref 78.0–100.0)
MONOS PCT: 8 % (ref 3–12)
MPV: 10 fL (ref 8.6–12.4)
Monocytes Absolute: 0.8 10*3/uL (ref 0.1–1.0)
NEUTROS PCT: 74 % (ref 43–77)
Neutro Abs: 7.1 10*3/uL (ref 1.7–7.7)
PLATELETS: 180 10*3/uL (ref 150–400)
RBC: 3.97 MIL/uL (ref 3.87–5.11)
RDW: 15.4 % (ref 11.5–15.5)
WBC: 9.6 10*3/uL (ref 4.0–10.5)

## 2016-01-26 LAB — COMPLETE METABOLIC PANEL WITH GFR
ALT: 11 U/L (ref 6–29)
AST: 17 U/L (ref 10–35)
Albumin: 3.5 g/dL — ABNORMAL LOW (ref 3.6–5.1)
Alkaline Phosphatase: 55 U/L (ref 33–130)
BUN: 40 mg/dL — ABNORMAL HIGH (ref 7–25)
CO2: 25 mmol/L (ref 20–31)
Calcium: 8.5 mg/dL — ABNORMAL LOW (ref 8.6–10.4)
Chloride: 102 mmol/L (ref 98–110)
Creat: 2.8 mg/dL — ABNORMAL HIGH (ref 0.50–0.99)
GFR, EST NON AFRICAN AMERICAN: 17 mL/min — AB (ref >=60)
GFR, Est African American: 19 mL/min — ABNORMAL LOW (ref >=60)
GLUCOSE: 134 mg/dL — AB (ref 65–99)
POTASSIUM: 3.4 mmol/L — AB (ref 3.5–5.3)
SODIUM: 139 mmol/L (ref 135–146)
TOTAL PROTEIN: 6.7 g/dL (ref 6.1–8.1)
Total Bilirubin: 0.4 mg/dL (ref 0.2–1.2)

## 2016-01-27 LAB — RPR: RPR Ser Ql: REACTIVE — AB

## 2016-01-27 LAB — HIV-1 RNA QUANT-NO REFLEX-BLD: HIV-1 RNA Quant, Log: <1.3 Log copies/mL (ref <1.30)

## 2016-01-27 LAB — URINE CYTOLOGY ANCILLARY ONLY
Chlamydia: NEGATIVE
Neisseria Gonorrhea: NEGATIVE

## 2016-01-27 LAB — RPR TITER: RPR Titer: 1:4 {titer}

## 2016-01-27 LAB — T-HELPER CELL (CD4) - (RCID CLINIC ONLY)
CD4 % Helper T Cell: 14 % — ABNORMAL LOW (ref 33–55)
CD4 T CELL ABS: 180 /uL — AB (ref 400–2700)

## 2016-01-28 LAB — FLUORESCENT TREPONEMAL AB(FTA)-IGG-BLD: Fluorescent Treponemal ABS: REACTIVE — AB

## 2016-02-09 ENCOUNTER — Encounter: Payer: Self-pay | Admitting: Infectious Disease

## 2016-02-09 ENCOUNTER — Ambulatory Visit (INDEPENDENT_AMBULATORY_CARE_PROVIDER_SITE_OTHER): Payer: Medicare Other | Admitting: Infectious Disease

## 2016-02-09 VITALS — BP 159/84 | HR 58 | Temp 98.3°F | Ht 62.0 in | Wt 168.0 lb

## 2016-02-09 DIAGNOSIS — Z940 Kidney transplant status: Secondary | ICD-10-CM | POA: Diagnosis not present

## 2016-02-09 DIAGNOSIS — I10 Essential (primary) hypertension: Secondary | ICD-10-CM

## 2016-02-09 DIAGNOSIS — E785 Hyperlipidemia, unspecified: Secondary | ICD-10-CM

## 2016-02-09 DIAGNOSIS — B20 Human immunodeficiency virus [HIV] disease: Secondary | ICD-10-CM

## 2016-02-09 DIAGNOSIS — Z21 Asymptomatic human immunodeficiency virus [HIV] infection status: Secondary | ICD-10-CM

## 2016-02-09 NOTE — Progress Notes (Signed)
Patient ID: [name], female   DOB: 10-Apr-1946, 70 y.o.   MRN: [id number] HPI: [name] is a 70 y.o. female who is here for her HIV f/u.   Allergies: Allergies  Allergen Reactions  . Omeprazole Other (See Comments)    Interferes with the absorption of rilipivirine  . Influenza Vaccines Other (See Comments)    Other reaction(s): Hallucination  . Influenza Virus Vacc Split Pf     Alleged severe illness in [place]. Do not have documentation from [place] but pt always refuses vaccine on these grounds    Vitals: Temp: 98.3 F (36.8 C) (03/08 1354) Temp Source: Oral (03/08 1354) BP: 159/84 mmHg (03/08 1354) Pulse Rate: 58 (03/08 1354)  Past Medical History: Past Medical History  Diagnosis Date  . HIV infection ([place])     Dx in 1998 in [place], she presented with PCP pneumonia at that time. Has been tried on multiple regimens by her PCP in [place] before./ She has been on current ART for years now. Moved to [facility] in 2008 and is following with Dr. Tommy Medal since then.   . Hepatitis C     untreated. VL 3700000 in 2008  . Diabetes mellitus     Las HbA1C   . Hypertension   . Chronic kidney disease     ESRD secondary to DM, started HD in 2008, HD TTS, Dr. Jimmy Footman is her nephrologist, on transplant list at [facility].  . Dialysis patient ([place])     T Th Sat  . History of hepatitis C 08/04/2015    Social History: Social History   Social History  . Marital Status: Single    Spouse Name: N/A  . Number of Children: N/A  . Years of Education: N/A   Social History Main Topics  . Smoking status: Former Smoker    Quit date: 05/22/1997  . Smokeless tobacco: Never Used  . Alcohol Use: No  . Drug Use: No  . Sexual Activity: Not Asked   Other Topics Concern  . None   Social History Narrative    Previous Regimen: EFV/EPZ, Triumeq  Current Regimen:   Labs: HIV 1 RNA QUANT (copies/mL)  Date Value  01/26/2016 <20  09/17/2015 <20  07/20/2015 <20   CD4 T CELL ABS (/uL)  Date Value   01/26/2016 180*  09/17/2015 240*  07/20/2015 230*   HEP B S AB (no units)  Date Value  11/12/2007 POS*   HEPATITIS B SURFACE AG (no units)  Date Value  11/12/2007 NEG   HCV AB (no units)  Date Value  11/12/2007 POS*    CrCl: Estimated Creatinine Clearance: 18.6 mL/min (by C-G formula based on Cr of 2.8).  Lipids:    Component Value Date/Time   CHOL 153 07/20/2015 1029   TRIG 103 07/20/2015 1029   HDL 63 07/20/2015 1029   CHOLHDL 2.4 07/20/2015 1029   VLDL 21 07/20/2015 1029   LDLCALC 69 07/20/2015 1029    Assessment: [name] came for her HIV f/u. Her ART was changed due to CRI. We are changing them to DTG/RPV/3TC. We wanted to make sure that she is not taking any PPIs if possible. I talked to her about options when is comes to antacids and H2 blockers and how to take them. She is probably going to take antacids when she has heartburn. She knows to take them 2 hrs before or 6 hrs after DTG.   Recommendations:  RPV 25mg  PO qday DTG 50mg  PO qday 3TC 100mg   PO qday  Wilfred Lacy, PharmD Clinical Infectious [place] [facility] Infectious Disease 02/09/2016, 3:06 PM

## 2016-02-09 NOTE — Progress Notes (Signed)
Chief complaint: followup for HIV, HTN, renal tranplantation  Subjective:    Patient ID: [name], female    DOB: 03-16-1946, 70 y.o.   MRN: [id number]  HPI   70 year-old woman with past medical history is significant for HIV infection, hypertension, diabetes mellitus type II, sp renal transplant at [facility]. She has maintained perfect virological suppression though she has had worsening renal fx that has required [place] to readjust her ARVs.  We eventually opted to change her to Tivicay, Rilpivirine and lamivudine.  Her serum creatinine has worsened. She has had BP meds adjusted by Dr Deterding who dc'd her hydralazine.   She has had her insulin adjusted in [facility] clinic by Dr. Merrilyn Puma.  She has had some heartburn recently but has avoided PPI and H2 blockers.    Past Medical History  Diagnosis Date  . HIV infection ([place])     Dx in 1998 in [place], she presented with PCP pneumonia at that time. Has been tried on multiple regimens by her PCP in [place] before./ She has been on current ART for years now. Moved to [facility] in 2008 and is following with Dr. Tommy Medal since then.   . Hepatitis C     untreated. VL 3700000 in 2008  . Diabetes mellitus     Las HbA1C   . Hypertension   . Chronic kidney disease     ESRD secondary to DM, started HD in 2008, HD TTS, Dr. Jimmy Footman is her nephrologist, on transplant list at [facility].  . Dialysis patient ([place])     T Th Sat  . History of hepatitis C 08/04/2015    Past Surgical History  Procedure Laterality Date  . Abdominal hysterectomy      and oopherectomy for fibroids  . Cholecystectomy    . Kidney transplant      Family History  Problem Relation Age of Onset  . Diabetes Mother   . Cancer Father   . Cancer Brother       Social History   Social History  . Marital Status: Single    Spouse Name: N/A  . Number of Children: N/A  . Years of Education: N/A   Social History Main Topics  . Smoking status: Former Smoker    Quit date: 05/22/1997  .  Smokeless tobacco: Never Used  . Alcohol Use: No  . Drug Use: No  . Sexual Activity: Not on file   Other Topics Concern  . Not on file   Social History Narrative    Allergies  Allergen Reactions  . Omeprazole Other (See Comments)    Interferes with the absorption of rilipivirine  . Influenza Vaccines Other (See Comments)    Other reaction(s): Hallucination  . Influenza Virus Vacc Split Pf     Alleged severe illness in [place]. Do not have documentation from [place] but pt always refuses vaccine on these grounds     Current outpatient prescriptions:  .  amLODipine (NORVASC) 5 MG tablet, Take 1 tablet (5 mg total) by mouth daily., Disp: 30 tablet, Rfl: 12 .  aspirin 81 MG EC tablet, Take 81 mg by mouth daily.  , Disp: , Rfl:  .  carvedilol (COREG) 6.25 MG tablet, Take 1 tablet (6.25 mg total) by mouth 2 (two) times daily with a meal., Disp: 60 tablet, Rfl: 12 .  cinacalcet (SENSIPAR) 60 MG tablet, Take 1 tablet (60 mg total) by mouth daily., Disp: 30 tablet, Rfl: 12 .  dolutegravir (TIVICAY) 50 MG tablet,  Take 1 tablet (50 mg total) by mouth daily., Disp: 30 tablet, Rfl: 11 .  fluconazole (DIFLUCAN) 150 MG tablet, Take one tablet by mouth today and another tablet after 72 hours., Disp: 2 tablet, Rfl: 0 .  FREESTYLE TEST STRIPS test strip, , Disp: , Rfl: 9 .  furosemide (LASIX) 40 MG tablet, Take 80 mg by mouth daily. , Disp: , Rfl: 0 .  insulin NPH-regular Human (NOVOLIN 70/30) (70-30) 100 UNIT/ML injection, Inject 16 units into the skin before breakfast and 16 units before dinner, Disp: 10 mL, Rfl: 12 .  Insulin Syringe-Needle U-100 31G X 15/64" 0.3 ML MISC, Sig: Use to Inject insulin twice a day, Disp: 100 each, Rfl: 5 .  lamivudine (EPIVIR) 100 MG tablet, Take 1 tablet (100 mg total) by mouth daily., Disp: 30 tablet, Rfl: 11 .  losartan (COZAAR) 100 MG tablet, Take 100 mg by mouth daily., Disp: , Rfl: 1 .  magnesium oxide (MAG-OX) 400 MG tablet, Take 400 mg by mouth 2 (two) times  daily., Disp: , Rfl:  .  mycophenolate (MYFORTIC) 180 MG EC tablet, Take 3 tablets (540 mg total) by mouth 2 (two) times daily., Disp: , Rfl:  .  PATADAY 0.2 % SOLN, , Disp: , Rfl: 0 .  rilpivirine (EDURANT) 25 MG TABS tablet, Take 1 tablet (25 mg total) by mouth daily with breakfast., Disp: 30 tablet, Rfl: 11 .  rosuvastatin (CRESTOR) 20 MG tablet, Take 1 tablet (20 mg total) by mouth daily., Disp: 90 tablet, Rfl: 3 .  senna-docusate (SENOKOT-S) 8.6-50 MG per tablet, Take 2 tablets by mouth 2 (two) times daily as needed for constipation., Disp: 60 tablet, Rfl: 12 .  tacrolimus (PROGRAF) 1 MG capsule, Take 5 mg ( 5 pills) in AM and 4 mg ( 4 pills) in PM--V42.0, Disp: , Rfl:      Review of Systems  Constitutional: Negative for fever, chills, diaphoresis, activity change, appetite change, fatigue and unexpected weight change.  HENT: Negative for congestion, rhinorrhea, sinus pressure, sneezing, sore throat and trouble swallowing.   Eyes: Negative for photophobia and visual disturbance.  Respiratory: Negative for cough, chest tightness, shortness of breath, wheezing and stridor.   Cardiovascular: Negative for chest pain, palpitations and leg swelling.  Gastrointestinal: Negative for nausea, vomiting, abdominal pain, diarrhea, constipation, blood in stool, abdominal distention and anal bleeding.  Genitourinary: Negative for dysuria, hematuria and difficulty urinating.  Musculoskeletal: Negative for myalgias, back pain, joint swelling and gait problem.  Skin: Negative for color change, pallor, rash and wound.  Neurological: Negative for dizziness, tremors, weakness and light-headedness.  Hematological: Negative for adenopathy. Does not bruise/bleed easily.  Psychiatric/Behavioral: Negative for behavioral problems, confusion, sleep disturbance, dysphoric mood, decreased concentration and agitation.       Objective:   Physical Exam  Constitutional: She is oriented to person, place, and time. She  appears well-developed and well-nourished. No distress.  HENT:  Head: Normocephalic and atraumatic.  Mouth/Throat: Oropharynx is clear and moist. No oropharyngeal exudate.  Eyes: Conjunctivae and EOM are normal. No scleral icterus.  Neck: Normal range of motion. Neck supple. No JVD present.  Cardiovascular: Normal rate, regular rhythm and normal heart sounds.   Pulmonary/Chest: Effort normal. No respiratory distress. She has no wheezes.  Abdominal: She exhibits no distension.  Musculoskeletal: She exhibits no edema or tenderness.  Lymphadenopathy:    She has no cervical adenopathy.  Neurological: She is alert and oriented to person, place, and time. Coordination normal.  Skin: Skin is warm and dry.  She is not diaphoretic. No erythema. No pallor.  Psychiatric: She has a normal mood and affect. Her behavior is normal. Judgment and thought content normal.          Assessment & Plan:   HIV:   Continue Tivicay 50mg  tablet with EDURANT 25mg  tablet and Epivir 100mg  table again t ALL TO BE TAKEN WITH 400 calorie + meal with fat in it. She is to [place].   Renal transplantation: being followed by Nephrology here. No longer going to [facility] she tells me.   DM: being followed by Endocrine, and Dr Merrilyn Puma  here in [facility] at [facility].  GERD: try mustard, Tums separated by at least 4 hours from her meds. If she needs H2 blockers or PPI will need to change her regimen again  HTN: BP again not at goal  Filed Vitals:   02/09/16 1354  BP: 159/84  Pulse: 58  Temp: 98.3 F (36.8 C)   Follow-up with PCP and Nephrology  I spent greater than 25 minutes with the patient including greater than 50% of time in face to face counsel of the patient re her HIV, her ARV regimen, need for better BP control, her DM, renal tranplant and in  coordination of their care.  Lavell Islam. Tommy Medal, [facility]

## 2016-03-06 ENCOUNTER — Other Ambulatory Visit: Payer: Self-pay

## 2016-03-06 DIAGNOSIS — E1121 Type 2 diabetes mellitus with diabetic nephropathy: Secondary | ICD-10-CM

## 2016-03-06 DIAGNOSIS — Z794 Long term (current) use of insulin: Principal | ICD-10-CM

## 2016-03-06 MED ORDER — INSULIN NPH ISOPHANE & REGULAR (70-30) 100 UNIT/ML ~~LOC~~ SUSP: SUBCUTANEOUS | Status: DC

## 2016-03-06 MED ORDER — CINACALCET HCL 60 MG PO TABS: 60.0000 mg | ORAL_TABLET | Freq: Every day | ORAL | Status: DC

## 2016-03-21 ENCOUNTER — Other Ambulatory Visit: Payer: Self-pay | Admitting: Internal Medicine

## 2016-03-21 DIAGNOSIS — E1121 Type 2 diabetes mellitus with diabetic nephropathy: Secondary | ICD-10-CM

## 2016-03-21 DIAGNOSIS — Z794 Long term (current) use of insulin: Principal | ICD-10-CM

## 2016-03-21 NOTE — Telephone Encounter (Signed)
Pt was called and informed refill was done 4/3 for a month supply and 1 yr refills

## 2016-03-21 NOTE — Telephone Encounter (Signed)
Need insulin refill-[facility] [phone]

## 2016-03-27 DIAGNOSIS — R809 Proteinuria, unspecified: Secondary | ICD-10-CM | POA: Diagnosis not present

## 2016-03-27 DIAGNOSIS — N2581 Secondary hyperparathyroidism of renal origin: Secondary | ICD-10-CM | POA: Diagnosis not present

## 2016-03-27 DIAGNOSIS — I129 Hypertensive chronic kidney disease with stage 1 through stage 4 chronic kidney disease, or unspecified chronic kidney disease: Secondary | ICD-10-CM | POA: Diagnosis not present

## 2016-03-27 DIAGNOSIS — E782 Mixed hyperlipidemia: Secondary | ICD-10-CM | POA: Diagnosis not present

## 2016-03-27 DIAGNOSIS — D631 Anemia in chronic kidney disease: Secondary | ICD-10-CM | POA: Diagnosis not present

## 2016-03-27 DIAGNOSIS — Z940 Kidney transplant status: Secondary | ICD-10-CM | POA: Diagnosis not present

## 2016-03-27 DIAGNOSIS — Z21 Asymptomatic human immunodeficiency virus [HIV] infection status: Secondary | ICD-10-CM | POA: Diagnosis not present

## 2016-03-27 DIAGNOSIS — B182 Chronic viral hepatitis C: Secondary | ICD-10-CM | POA: Diagnosis not present

## 2016-03-27 DIAGNOSIS — R635 Abnormal weight gain: Secondary | ICD-10-CM | POA: Diagnosis not present

## 2016-03-27 DIAGNOSIS — E118 Type 2 diabetes mellitus with unspecified complications: Secondary | ICD-10-CM | POA: Diagnosis not present

## 2016-03-27 DIAGNOSIS — N184 Chronic kidney disease, stage 4 (severe): Secondary | ICD-10-CM | POA: Diagnosis not present

## 2016-04-12 DIAGNOSIS — Z940 Kidney transplant status: Secondary | ICD-10-CM | POA: Diagnosis not present

## 2016-04-18 ENCOUNTER — Telehealth: Payer: Self-pay | Admitting: Dietician

## 2016-04-18 NOTE — Telephone Encounter (Signed)
[facility] is [facility] 6 months refill history. This was given to Freddy Finner.

## 2016-04-19 ENCOUNTER — Encounter: Payer: Self-pay | Admitting: Internal Medicine

## 2016-04-19 ENCOUNTER — Ambulatory Visit (INDEPENDENT_AMBULATORY_CARE_PROVIDER_SITE_OTHER): Payer: Medicare Other | Admitting: Internal Medicine

## 2016-04-19 DIAGNOSIS — Z940 Kidney transplant status: Secondary | ICD-10-CM | POA: Diagnosis not present

## 2016-04-19 DIAGNOSIS — E1165 Type 2 diabetes mellitus with hyperglycemia: Secondary | ICD-10-CM

## 2016-04-19 DIAGNOSIS — Z794 Long term (current) use of insulin: Secondary | ICD-10-CM | POA: Diagnosis not present

## 2016-04-19 DIAGNOSIS — B373 Candidiasis of vulva and vagina: Secondary | ICD-10-CM | POA: Diagnosis not present

## 2016-04-19 DIAGNOSIS — E1121 Type 2 diabetes mellitus with diabetic nephropathy: Secondary | ICD-10-CM

## 2016-04-19 DIAGNOSIS — I10 Essential (primary) hypertension: Secondary | ICD-10-CM

## 2016-04-19 DIAGNOSIS — B3731 Acute candidiasis of vulva and vagina: Secondary | ICD-10-CM

## 2016-04-19 LAB — POCT GLYCOSYLATED HEMOGLOBIN (HGB A1C): HEMOGLOBIN A1C: 8.3

## 2016-04-19 LAB — GLUCOSE, CAPILLARY: GLUCOSE-CAPILLARY: 149 mg/dL — AB (ref 65–99)

## 2016-04-19 MED ORDER — INSULIN NPH ISOPHANE & REGULAR (70-30) 100 UNIT/ML ~~LOC~~ SUSP: SUBCUTANEOUS | Status: DC

## 2016-04-19 MED ORDER — FREESTYLE TEST VI STRP: ORAL_STRIP | Status: DC

## 2016-04-19 MED ORDER — FLUCONAZOLE 150 MG PO TABS: 150.0000 mg | ORAL_TABLET | ORAL | Status: DC

## 2016-04-19 NOTE — Progress Notes (Signed)
   Patient ID: [name] female   DOB: [birth year] 70 y.o.   MRN: [id number]  Subjective:   HPI: [name] is a 70 y.o. 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 She says that overall she feels well. She does complain of some right-sided soreness in her lower ribs after several days of heavy lifting without fevers, urinary symptoms, or other associated symptoms.    Please see problem-based charting for status of medical issues pertinent to this visit.  Review of Systems: Pertinent items noted in HPI and remainder of comprehensive ROS otherwise negative.  Objective:  Physical Exam: Filed Vitals:   04/19/16 1332 04/19/16 1408  BP: 183/73 156/71  Pulse: 69 64  Temp: 98.1 F (36.7 C)   TempSrc: Oral   Height: 5\' 2"  (1.575 m)   Weight: 166 lb 1.6 oz (75.342 kg)   SpO2: 100%    Gen: Well-appearing, alert and oriented to person, place, and time HEENT: Oropharynx clear without erythema or exudate.  Neck: No cervical LAD, no thyromegaly or nodules, no JVD noted. CV: Normal rate, regular rhythm, no murmurs, rubs, or gallops Pulmonary: Normal effort, CTA bilaterally, no wheezing, rales, or rhonchi Abdominal: Soft, non-tender, non-distended, without rebound, guarding, or masses Extremities: Distal pulses 2+ in upper and lower extremities bilaterally, no tenderness, erythema or edema Skin: No atypical appearing moles. No rashes  Assessment & Plan:  Please see problem-based charting for assessment and plan.  Blane Ohara, MD Resident Physician, PGY-1 Department of Internal Medicine [facility]

## 2016-04-21 ENCOUNTER — Other Ambulatory Visit: Payer: Self-pay | Admitting: Internal Medicine

## 2016-04-21 DIAGNOSIS — E1121 Type 2 diabetes mellitus with diabetic nephropathy: Secondary | ICD-10-CM

## 2016-04-21 DIAGNOSIS — E118 Type 2 diabetes mellitus with unspecified complications: Secondary | ICD-10-CM

## 2016-04-21 DIAGNOSIS — Z794 Long term (current) use of insulin: Principal | ICD-10-CM

## 2016-04-21 MED ORDER — "INSULIN SYRINGE-NEEDLE U-100 31G X 15/64"" 0.3 ML MISC": Status: DC

## 2016-04-21 MED ORDER — INSULIN NPH ISOPHANE & REGULAR (70-30) 100 UNIT/ML ~~LOC~~ SUSP: SUBCUTANEOUS | Status: DC

## 2016-04-24 DIAGNOSIS — B3731 Acute candidiasis of vulva and vagina: Secondary | ICD-10-CM | POA: Insufficient documentation

## 2016-04-24 DIAGNOSIS — B373 Candidiasis of vulva and vagina: Secondary | ICD-10-CM | POA: Insufficient documentation

## 2016-04-24 NOTE — Assessment & Plan Note (Signed)
BP Readings from Last 3 Encounters:  04/19/16 156/71  02/09/16 159/84  01/20/16 153/70    Lab Results  Component Value Date   NA 139 01/26/2016   K 3.4* 01/26/2016   CREATININE 2.80* 01/26/2016    Assessment: Blood pressure control:  not at goal Progress toward BP goal:   not at goal Comments: Currently in the process of her BP meds being changed around by [facility] who follows her for renal transplant  Plan: Medications:  will continue current meds for now. Does need med rec at next office visit here to reflect [place] changes (they have been stopping our various meds, changing doses). She has an appt with them next week. So, once she returns for routine follow-up, confirm the current medications she's taking for BP and adjust accordingly if indicated.

## 2016-04-24 NOTE — Assessment & Plan Note (Signed)
Patient with 3 days of vaginal irritation and whitish vaginal discharge, identical to previous yeast infections that she's had in the past when her 'sugars got too high'. She denies other symptoms, such as fevers, abdominal pain, urinary frequency or pain, or other symptoms at this time. -Will treat empirically for vulvovaginal candidiasis in an immunocompromised, diabetic patient with fluconazole 150mg  once now and a 2nd dose in 2 days -Instructed to call our clinic if symptoms do not resolve in the next few days with above medication

## 2016-04-24 NOTE — Assessment & Plan Note (Addendum)
Lab Results  Component Value Date   HGBA1C 8.3 04/19/2016   HGBA1C 8.0 01/20/2016   HGBA1C 6.7 09/16/2015     Assessment: Diabetes control:  poorly-controlled Progress toward A1C goal:   not at goal Comments: on Novolin 70/30 16U am 16u PM. Reviewed CBGs and no significant lows (70's lowest, no symptoms). Patient particularly high 180s-220s on AM CBGs  Plan: Medications:  Continue 16U AM, increase AM to 20U. Continue lifestyle modifications as discussed today.  Also refilled her testing strips and supplies today.

## 2016-04-27 DIAGNOSIS — N184 Chronic kidney disease, stage 4 (severe): Secondary | ICD-10-CM | POA: Diagnosis not present

## 2016-04-27 DIAGNOSIS — I129 Hypertensive chronic kidney disease with stage 1 through stage 4 chronic kidney disease, or unspecified chronic kidney disease: Secondary | ICD-10-CM | POA: Diagnosis not present

## 2016-04-27 NOTE — Progress Notes (Signed)
Internal Medicine Clinic Attending  Case discussed with Dr. Kennedy at the time of the visit.  We reviewed the resident's history and exam and pertinent patient test results.  I agree with the assessment, diagnosis, and plan of care documented in the resident's note.  

## 2016-05-23 ENCOUNTER — Encounter: Payer: Self-pay | Admitting: *Deleted

## 2016-06-19 DIAGNOSIS — E782 Mixed hyperlipidemia: Secondary | ICD-10-CM | POA: Diagnosis not present

## 2016-06-19 DIAGNOSIS — N184 Chronic kidney disease, stage 4 (severe): Secondary | ICD-10-CM | POA: Diagnosis not present

## 2016-06-19 DIAGNOSIS — E118 Type 2 diabetes mellitus with unspecified complications: Secondary | ICD-10-CM | POA: Diagnosis not present

## 2016-06-19 DIAGNOSIS — N2581 Secondary hyperparathyroidism of renal origin: Secondary | ICD-10-CM | POA: Diagnosis not present

## 2016-06-19 DIAGNOSIS — R635 Abnormal weight gain: Secondary | ICD-10-CM | POA: Diagnosis not present

## 2016-06-19 DIAGNOSIS — D631 Anemia in chronic kidney disease: Secondary | ICD-10-CM | POA: Diagnosis not present

## 2016-06-19 DIAGNOSIS — Z21 Asymptomatic human immunodeficiency virus [HIV] infection status: Secondary | ICD-10-CM | POA: Diagnosis not present

## 2016-06-19 DIAGNOSIS — Z6831 Body mass index (BMI) 31.0-31.9, adult: Secondary | ICD-10-CM | POA: Diagnosis not present

## 2016-06-19 DIAGNOSIS — R809 Proteinuria, unspecified: Secondary | ICD-10-CM | POA: Diagnosis not present

## 2016-06-19 DIAGNOSIS — I129 Hypertensive chronic kidney disease with stage 1 through stage 4 chronic kidney disease, or unspecified chronic kidney disease: Secondary | ICD-10-CM | POA: Diagnosis not present

## 2016-06-19 DIAGNOSIS — Z940 Kidney transplant status: Secondary | ICD-10-CM | POA: Diagnosis not present

## 2016-08-02 ENCOUNTER — Other Ambulatory Visit: Payer: Self-pay | Admitting: Infectious Disease

## 2016-08-25 ENCOUNTER — Ambulatory Visit (INDEPENDENT_AMBULATORY_CARE_PROVIDER_SITE_OTHER): Payer: Medicare Other | Admitting: Internal Medicine

## 2016-08-25 VITALS — BP 156/68 | HR 65 | Temp 98.2°F | Ht 62.0 in | Wt 161.0 lb

## 2016-08-25 DIAGNOSIS — Z0000 Encounter for general adult medical examination without abnormal findings: Secondary | ICD-10-CM

## 2016-08-25 DIAGNOSIS — Z87891 Personal history of nicotine dependence: Secondary | ICD-10-CM | POA: Diagnosis not present

## 2016-08-25 DIAGNOSIS — Z1239 Encounter for other screening for malignant neoplasm of breast: Secondary | ICD-10-CM

## 2016-08-25 DIAGNOSIS — I10 Essential (primary) hypertension: Secondary | ICD-10-CM | POA: Diagnosis not present

## 2016-08-25 DIAGNOSIS — E785 Hyperlipidemia, unspecified: Secondary | ICD-10-CM

## 2016-08-25 DIAGNOSIS — M25512 Pain in left shoulder: Secondary | ICD-10-CM

## 2016-08-25 DIAGNOSIS — Z79899 Other long term (current) drug therapy: Secondary | ICD-10-CM | POA: Diagnosis not present

## 2016-08-25 DIAGNOSIS — M549 Dorsalgia, unspecified: Secondary | ICD-10-CM

## 2016-08-25 DIAGNOSIS — Z794 Long term (current) use of insulin: Secondary | ICD-10-CM

## 2016-08-25 DIAGNOSIS — Z21 Asymptomatic human immunodeficiency virus [HIV] infection status: Secondary | ICD-10-CM | POA: Diagnosis not present

## 2016-08-25 DIAGNOSIS — B20 Human immunodeficiency virus [HIV] disease: Secondary | ICD-10-CM

## 2016-08-25 DIAGNOSIS — E1165 Type 2 diabetes mellitus with hyperglycemia: Secondary | ICD-10-CM

## 2016-08-25 DIAGNOSIS — E1121 Type 2 diabetes mellitus with diabetic nephropathy: Secondary | ICD-10-CM

## 2016-08-25 DIAGNOSIS — E1129 Type 2 diabetes mellitus with other diabetic kidney complication: Secondary | ICD-10-CM

## 2016-08-25 DIAGNOSIS — Z1231 Encounter for screening mammogram for malignant neoplasm of breast: Secondary | ICD-10-CM

## 2016-08-25 LAB — GLUCOSE, CAPILLARY: GLUCOSE-CAPILLARY: 200 mg/dL — AB (ref 65–99)

## 2016-08-25 LAB — POCT GLYCOSYLATED HEMOGLOBIN (HGB A1C): HEMOGLOBIN A1C: 8.3

## 2016-08-25 MED ORDER — INSULIN NPH ISOPHANE & REGULAR (70-30) 100 UNIT/ML ~~LOC~~ SUSP: SUBCUTANEOUS | 12 refills | Status: DC

## 2016-08-25 MED ORDER — DICLOFENAC SODIUM 1 % TD GEL: 2.0000 g | Freq: Four times a day (QID) | TRANSDERMAL | 2 refills | Status: DC

## 2016-08-25 MED ORDER — ROSUVASTATIN CALCIUM 20 MG PO TABS: 20.0000 mg | ORAL_TABLET | Freq: Every day | ORAL | 3 refills | Status: DC

## 2016-08-25 NOTE — Progress Notes (Signed)
   CC: For her hypertension and diabetes follow-up.  HPI:  [name] is a 70 y.o. with past medical history as listed below, came to the clinic to follow-up for her diabetes and hypertension. She is being followed up with nephrology for after renal transplant, she also followed up with infectious disease for her HIV needs. She was complaining of mild left shoulder pain, pain is more posteriorly in her upper back close to the shoulder area. She states that pain increased with movement, she denies any injury, heavy lifting. She states that she is having this pain on and off for last few month. And she uses Tylenol which always help. She has no other complaint today. She declined flu shot stating that it gave her a bad reaction last time.  Past Medical History:  Diagnosis Date  . Chronic kidney disease    ESRD secondary to DM, started HD in 2008, HD TTS, Dr. Jimmy Footman is her nephrologist, on transplant list at [facility] [place].  . Diabetes mellitus    Las HbA1C   . Dialysis patient [facility])    T Th Sat  . Hepatitis C    untreated. VL 3700000 in 2008  . History of hepatitis C 08/04/2015  . HIV infection ([place])    Dx in 1998 in [place], she presented with PCP pneumonia at that time. Has been tried on multiple regimens by her PCP in [place] before./ She has been on current ART for years now. Moved to [facility] in 2008 and is following with Dr. Tommy Medal since then.   . Hypertension     Review of Systems:  As per HPI.  Physical Exam:  Vitals:   08/25/16 1427  BP: (!) 156/68  Pulse: 65  Temp: 98.2 F (36.8 C)  TempSrc: Oral  SpO2: 100%  Weight: 161 lb (73 kg)  Height: 5\' 2"  (1.575 m)   Gen: Well-appearing, alert and oriented to person, place, and time HEENT: Oropharynx clear without erythema or exudate.  Neck: No cervical LAD, no thyromegaly or nodules, no JVD noted. Musculoskeletal. There was mild tenderness around infraspinatus and posterior deltoid region. No edema or erythema. Range of motion  normal. Strength and sensations normal. CV: Normal rate, regular rhythm, no murmurs, rubs, or gallops Pulmonary: Normal effort, CTA bilaterally, no wheezing, rales, or rhonchi Abdominal: Soft, non-tender, non-distended, without rebound, guarding, or masses Extremities: Distal pulses 2+ in upper and lower extremities bilaterally, no tenderness, erythema or edema  Assessment & Plan:   See Encounters Tab for problem based charting.  Patient seen with Dr. Angelia Mould.

## 2016-08-25 NOTE — Assessment & Plan Note (Signed)
She was complaining of mild left shoulder pain, pain is more posteriorly in her upper back close to the shoulder area. She states that pain increased with movement, she denies any injury, heavy lifting. She states that she is having this pain on and off for last few month. And she uses Tylenol which always help. On exam. There was mild tenderness around infraspinatus and posterior deltoid region. No edema or erythema. Range of motion normal. Strength and sensations normal.  Her pain looks more like muscular in no region. Continue with Tylenol if needed as it been helping her before.

## 2016-08-25 NOTE — Patient Instructions (Addendum)
Thank you for coming to the clinic today. Your blood sugar and blood pressure both were little high. Your A1c was 8.3, good goal A1c is 7. I am increasing your Novolin to 18 units in the morning and 22 units at night. Please watch for your diet, low carb and low sodium diet. Check your blood sugar more frequently and bring your glucose meter with you for your next appointment. I'm not making any changes in your blood pressure medicine as your kidney doctor is taking care of that. You can use Tylenol 650 mg 3-4 times a day for your pain if needed. You can rub Voltaren gel on your sore shoulder muscles if needed. You can also use a heating pad to ease of the pain. Please go for your regular eye exam. You also need to a mammogram at this time. You can come back in 3 months for follow-up. You can, always come back earlier if needed.

## 2016-08-25 NOTE — Assessment & Plan Note (Signed)
She refused flu vaccine today stating that it causes a bad reaction last time.

## 2016-08-25 NOTE — Assessment & Plan Note (Signed)
She has well-controlled HIV. She has been followed up at infectious disease, and stay compliant with her medicines.

## 2016-08-25 NOTE — Assessment & Plan Note (Signed)
Her A1c today was 8.3 with CBG of 200. She did bring her glucometer meter which shows an average of 167. No hypoglycemia. But she rechecked her blood sugar just 9 days in the whole month.  She has uncontrolled diabetes.  I will increase her Novolin 70/30 to 18 units in the morning and 22 units at night. We advised her to regularly check her blood sugars and bring her glucometer meter during next follow-up visit.

## 2016-08-25 NOTE — Assessment & Plan Note (Signed)
BP Readings from Last 3 Encounters:  08/25/16 (!) 156/68  04/19/16 (!) 156/71  02/09/16 (!) 159/84   Her blood pressure was above the goal today. She is on multiple antihypertensives managed by her nephrologist. We will let them manage her hypertension, as they have changed the medicines but we have prescribed for her in the past multiple times.

## 2016-08-28 NOTE — Progress Notes (Signed)
Internal Medicine Clinic Attending  I saw and evaluated the patient.  I personally confirmed the key portions of the history and exam documented by Dr. Amin and I reviewed pertinent patient test results.  The assessment, diagnosis, and plan were formulated together and I agree with the documentation in the resident's note. 

## 2016-08-29 ENCOUNTER — Telehealth: Payer: Self-pay | Admitting: Internal Medicine

## 2016-08-29 ENCOUNTER — Ambulatory Visit (INDEPENDENT_AMBULATORY_CARE_PROVIDER_SITE_OTHER): Payer: Medicare Other | Admitting: Podiatry

## 2016-08-29 ENCOUNTER — Encounter: Payer: Self-pay | Admitting: Podiatry

## 2016-08-29 VITALS — BP 187/92 | HR 61 | Resp 14

## 2016-08-29 DIAGNOSIS — B351 Tinea unguium: Secondary | ICD-10-CM | POA: Diagnosis not present

## 2016-08-29 DIAGNOSIS — M79676 Pain in unspecified toe(s): Secondary | ICD-10-CM

## 2016-08-29 NOTE — Patient Instructions (Signed)
Diabetes and Foot Care Diabetes may cause you to have problems because of poor blood supply (circulation) to your feet and legs. This may cause the skin on your feet to become thinner, break easier, and heal more slowly. Your skin may become dry, and the skin may peel and crack. You may also have nerve damage in your legs and feet causing decreased feeling in them. You may not notice minor injuries to your feet that could lead to infections or more serious problems. Taking care of your feet is one of the most important things you can do for yourself.  HOME CARE INSTRUCTIONS  Wear shoes at all times, even in the house. Do not go barefoot. Bare feet are easily injured.  Check your feet daily for blisters, cuts, and redness. If you cannot see the bottom of your feet, use a mirror or ask someone for help.  Wash your feet with warm water (do not use hot water) and mild soap. Then pat your feet and the areas between your toes until they are completely dry. Do not soak your feet as this can dry your skin.  Apply a moisturizing lotion or petroleum jelly (that does not contain alcohol and is unscented) to the skin on your feet and to dry, brittle toenails. Do not apply lotion between your toes.  Trim your toenails straight across. Do not dig under them or around the cuticle. File the edges of your nails with an emery board or nail file.  Do not cut corns or calluses or try to remove them with medicine.  Wear clean socks or stockings every day. Make sure they are not too tight. Do not wear knee-high stockings since they may decrease blood flow to your legs.  Wear shoes that fit properly and have enough cushioning. To break in new shoes, wear them for just a few hours a day. This prevents you from injuring your feet. Always look in your shoes before you put them on to be sure there are no objects inside.  Do not cross your legs. This may decrease the blood flow to your feet.  If you find a minor scrape,  cut, or break in the skin on your feet, keep it and the skin around it clean and dry. These areas may be cleansed with mild soap and water. Do not cleanse the area with peroxide, alcohol, or iodine.  When you remove an adhesive bandage, be sure not to damage the skin around it.  If you have a wound, look at it several times a day to make sure it is healing.  Do not use heating pads or hot water bottles. They may burn your skin. If you have lost feeling in your feet or legs, you may not know it is happening until it is too late.  Make sure your health care provider performs a complete foot exam at least annually or more often if you have foot problems. Report any cuts, sores, or bruises to your health care provider immediately. SEEK MEDICAL CARE IF:   You have an injury that is not healing.  You have cuts or breaks in the skin.  You have an ingrown nail.  You notice redness on your legs or feet.  You feel burning or tingling in your legs or feet.  You have pain or cramps in your legs and feet.  Your legs or feet are numb.  Your feet always feel cold. SEEK IMMEDIATE MEDICAL CARE IF:   There is increasing redness,   swelling, or pain in or around a wound.  There is a red line that goes up your leg.  Pus is coming from a wound.  You develop a fever or as directed by your health care provider.  You notice a bad smell coming from an ulcer or wound.   This information is not intended to replace advice given to you by your health care provider. Make sure you discuss any questions you have with your health care provider.   Document Released: 11/17/2000 Document Revised: 07/23/2013 Document Reviewed: 04/29/2013 Elsevier Interactive Patient Education 2016 [employer]  

## 2016-08-29 NOTE — Progress Notes (Signed)
   Subjective:    Patient ID: [name], female    DOB: Oct 11, 1946, 70 y.o.   MRN: [id number]  HPI This patient presents today requesting nail debridement and foot examination. Patient has history of diabetes and denies any history of foot ulceration, claudication or amputation. Patient has history of kidney transplant with success    Review of Systems  All other systems reviewed and are negative.      Objective:   Physical Exam  Orientated 3  Vascular: No peripheral edema or calf tenderness bilaterally DP pulses 2/4 bilaterally PT pulses 1/4 bilaterally Capillary reflex immediate bilaterally  Neurological: Sensation to 10 g monofilament wire intact 4/5 right 5/5 left Vibratory sedation intact bilaterally ankle reflex equal and reactive bilaterally  Dermatological: No open skin lesions bilaterally The toenails are elongated with occasional texture and color changes with occasional hypertrophy and some of the nail plates  Musculoskeletal: HAV right Manual motor testing: Dorsi flexion, plantar flexion, inversion, eversion 54/5 bilaterally      Assessment & Plan:   Assessment: Decrease pedal pulses without history of open wounds Protective sensation intact bilaterally Mycotic toenails 6-10  Plan: I reviewed the results of exam with patient today and offered nail debridement and she verbally consents The toenails 6-10 were debrided mechanically and legs without a bleeding  Return as needed or yearly

## 2016-09-04 ENCOUNTER — Other Ambulatory Visit: Payer: Medicare Other

## 2016-09-04 DIAGNOSIS — Z940 Kidney transplant status: Secondary | ICD-10-CM | POA: Diagnosis not present

## 2016-09-04 DIAGNOSIS — B20 Human immunodeficiency virus [HIV] disease: Secondary | ICD-10-CM | POA: Diagnosis not present

## 2016-09-04 DIAGNOSIS — E785 Hyperlipidemia, unspecified: Secondary | ICD-10-CM | POA: Diagnosis not present

## 2016-09-04 LAB — CBC WITH DIFFERENTIAL/PLATELET
BASOS PCT: 0 %
Basophils Absolute: 0 cells/uL (ref 0–200)
EOS PCT: 4 %
Eosinophils Absolute: 284 cells/uL (ref 15–500)
HEMATOCRIT: 33.2 % — AB (ref 35.0–45.0)
HEMOGLOBIN: 10.9 g/dL — AB (ref 11.7–15.5)
LYMPHS ABS: 1136 {cells}/uL (ref 850–3900)
LYMPHS PCT: 16 %
MCH: 27.9 pg (ref 27.0–33.0)
MCHC: 32.8 g/dL (ref 32.0–36.0)
MCV: 85.1 fL (ref 80.0–100.0)
MONO ABS: 497 {cells}/uL (ref 200–950)
MPV: 11 fL (ref 7.5–12.5)
Monocytes Relative: 7 %
Neutro Abs: 5183 cells/uL (ref 1500–7800)
Neutrophils Relative %: 73 %
Platelets: 158 10*3/uL (ref 140–400)
RBC: 3.9 MIL/uL (ref 3.80–5.10)
RDW: 15.2 % — AB (ref 11.0–15.0)
WBC: 7.1 10*3/uL (ref 3.8–10.8)

## 2016-09-04 LAB — COMPLETE METABOLIC PANEL WITH GFR
ALT: 11 U/L (ref 6–29)
AST: 15 U/L (ref 10–35)
Albumin: 3.1 g/dL — ABNORMAL LOW (ref 3.6–5.1)
Alkaline Phosphatase: 61 U/L (ref 33–130)
BUN: 37 mg/dL — AB (ref 7–25)
CALCIUM: 8.1 mg/dL — AB (ref 8.6–10.4)
CHLORIDE: 104 mmol/L (ref 98–110)
CO2: 23 mmol/L (ref 20–31)
CREATININE: 2.86 mg/dL — AB (ref 0.50–0.99)
GFR, Est African American: 19 mL/min — ABNORMAL LOW (ref >=60)
GFR, Est Non African American: 16 mL/min — ABNORMAL LOW (ref >=60)
GLUCOSE: 137 mg/dL — AB (ref 65–99)
Potassium: 3.6 mmol/L (ref 3.5–5.3)
SODIUM: 139 mmol/L (ref 135–146)
Total Bilirubin: 0.3 mg/dL (ref 0.2–1.2)
Total Protein: 5.6 g/dL — ABNORMAL LOW (ref 6.1–8.1)

## 2016-09-04 LAB — LIPID PANEL
CHOL/HDL RATIO: 2.5 ratio (ref <=5.0)
CHOLESTEROL: 167 mg/dL (ref 125–200)
HDL: 66 mg/dL (ref >=46)
LDL CALC: 84 mg/dL (ref <130)
TRIGLYCERIDES: 87 mg/dL (ref <150)
VLDL: 17 mg/dL (ref <30)

## 2016-09-05 LAB — T-HELPER CELL (CD4) - (RCID CLINIC ONLY)
CD4 % Helper T Cell: 16 % — ABNORMAL LOW (ref 33–55)
CD4 T CELL ABS: 170 /uL — AB (ref 400–2700)

## 2016-09-05 LAB — HIV-1 RNA QUANT-NO REFLEX-BLD: HIV 1 RNA Quant: <20 copies/mL (ref <20)

## 2016-09-05 LAB — FLUORESCENT TREPONEMAL AB(FTA)-IGG-BLD: Fluorescent Treponemal ABS: REACTIVE — AB

## 2016-09-05 LAB — RPR: RPR Ser Ql: REACTIVE — AB

## 2016-09-05 LAB — RPR TITER: RPR Titer: 1:4 {titer}

## 2016-09-18 ENCOUNTER — Encounter: Payer: Self-pay | Admitting: Infectious Disease

## 2016-09-18 ENCOUNTER — Ambulatory Visit (INDEPENDENT_AMBULATORY_CARE_PROVIDER_SITE_OTHER): Payer: Medicare Other | Admitting: Infectious Disease

## 2016-09-18 VITALS — BP 158/77 | HR 71 | Temp 98.3°F | Wt 162.0 lb

## 2016-09-18 DIAGNOSIS — Z940 Kidney transplant status: Secondary | ICD-10-CM

## 2016-09-18 DIAGNOSIS — B20 Human immunodeficiency virus [HIV] disease: Secondary | ICD-10-CM

## 2016-09-18 DIAGNOSIS — E1129 Type 2 diabetes mellitus with other diabetic kidney complication: Secondary | ICD-10-CM | POA: Diagnosis not present

## 2016-09-18 DIAGNOSIS — I10 Essential (primary) hypertension: Secondary | ICD-10-CM | POA: Diagnosis not present

## 2016-09-18 DIAGNOSIS — Z794 Long term (current) use of insulin: Secondary | ICD-10-CM | POA: Diagnosis not present

## 2016-09-18 NOTE — Progress Notes (Signed)
Chief complaint: followup for HIV, HTN, renal tranplantation  Subjective:    Patient ID: [name], female    DOB: 24-Jun-1946, 70 y.o.   MRN: [id number]  HPI  70 year-old woman with past medical history is significant for HIV infection, hypertension, diabetes mellitus type II, sp renal transplant at [facility]. She has maintained perfect virological suppression though she has had worsening renal fx that has required [place] to readjust her ARVs.  We eventually opted to change her to Tivicay, Rilpivirine and lamivudine.  Her serum creatinine has steadily worsened over the past 2 years and I suspect combination of HTN and possibly her DM are driving this.   She rarely has GERD and takes Tums for this while properly AVOIDING PPI and H2 blockers given that she is on RPV.    Past Medical History:  Diagnosis Date  . Chronic kidney disease    ESRD secondary to DM, started HD in 2008, HD TTS, Dr. Jimmy Footman is her nephrologist, on transplant list at [facility].  . Diabetes mellitus    Las HbA1C   . Dialysis patient [facility])    T Th Sat  . Hepatitis C    untreated. VL 3700000 in 2008  . History of hepatitis C 08/04/2015  . HIV infection ([employer])    Dx in 1998 in [place], she presented with PCP pneumonia at that time. Has been tried on multiple regimens by her PCP in [place] before./ She has been on current ART for years now. Moved to [facility] in 2008 and is following with Dr. Tommy Medal since then.   . Hypertension     Past Surgical History:  Procedure Laterality Date  . ABDOMINAL HYSTERECTOMY     and oopherectomy for fibroids  . CHOLECYSTECTOMY    . KIDNEY TRANSPLANT      Family History  Problem Relation Age of Onset  . Diabetes Mother   . Cancer Father   . Cancer Brother       Social History   Social History  . Marital status: Single    Spouse name: N/A  . Number of children: N/A  . Years of education: N/A   Social History Main Topics  . Smoking status: Former Smoker    Quit date: 05/22/1997  .  Smokeless tobacco: Never Used  . Alcohol use No  . Drug use: No  . Sexual activity: Not on file   Other Topics Concern  . Not on file   Social History Narrative  . No narrative on file    Allergies  Allergen Reactions  . Omeprazole Other (See Comments)    Interferes with the absorption of rilipivirine  . Influenza Vaccines Other (See Comments)    Other reaction(s): Hallucination  . Influenza Virus Vacc Split Pf     Alleged severe illness in [place]. Do not have documentation from [place] but pt always refuses vaccine on these grounds     Current Outpatient Prescriptions:  .  amLODipine (NORVASC) 5 MG tablet, Take 1 tablet (5 mg total) by mouth daily., Disp: 30 tablet, Rfl: 12 .  aspirin 81 MG EC tablet, Take 81 mg by mouth daily.  , Disp: , Rfl:  .  carvedilol (COREG) 6.25 MG tablet, Take 1 tablet (6.25 mg total) by mouth 2 (two) times daily with a meal., Disp: 60 tablet, Rfl: 12 .  cinacalcet (SENSIPAR) 60 MG tablet, Take 1 tablet (60 mg total) by mouth daily., Disp: 30 tablet, Rfl: 12 .  diclofenac sodium (VOLTAREN) 1 %  GEL, Apply 2 g topically 4 (four) times daily., Disp: 100 g, Rfl: 2 .  EDURANT 25 MG TABS tablet, TAKE 1 TABLET BY MOUTH DAILY WITH BREAKFAST, Disp: 30 tablet, Rfl: 5 .  fluconazole (DIFLUCAN) 150 MG tablet, Take 1 tablet (150 mg total) by mouth every other day. Take 1st pill today and 2nd pill on 04/21/2016, Disp: 2 tablet, Rfl: 0 .  FREESTYLE TEST STRIPS test strip, Use with glucometer, Disp: 100 each, Rfl: 9 .  furosemide (LASIX) 40 MG tablet, Take 80 mg by mouth daily. , Disp: , Rfl: 0 .  insulin NPH-regular Human (NOVOLIN 70/30) (70-30) 100 UNIT/ML injection, Inject 18 units into the skin before breakfast and 22 units before dinner, Disp: 10 mL, Rfl: 12 .  Insulin Syringe-Needle U-100 31G X 15/64" 0.3 ML MISC, Sig: Use to Inject insulin twice a day, Disp: 100 each, Rfl: 5 .  lamivudine (EPIVIR) 100 MG tablet, take 1 tablet by mouth daily, Disp: 30 tablet, Rfl:  5 .  losartan (COZAAR) 100 MG tablet, Take 100 mg by mouth daily., Disp: , Rfl: 1 .  magnesium oxide (MAG-OX) 400 MG tablet, Take 400 mg by mouth 2 (two) times daily., Disp: , Rfl:  .  mycophenolate (MYFORTIC) 180 MG EC tablet, Take 3 tablets (540 mg total) by mouth 2 (two) times daily., Disp: , Rfl:  .  PATADAY 0.2 % SOLN, , Disp: , Rfl: 0 .  predniSONE (DELTASONE) 5 MG tablet, Take 5 mg by mouth daily., Disp: , Rfl:  .  rosuvastatin (CRESTOR) 20 MG tablet, Take 1 tablet (20 mg total) by mouth daily., Disp: 90 tablet, Rfl: 3 .  senna-docusate (SENOKOT-S) 8.6-50 MG per tablet, Take 2 tablets by mouth 2 (two) times daily as needed for constipation., Disp: 60 tablet, Rfl: 12 .  tacrolimus (PROGRAF) 1 MG capsule, Take 4 mg ( 4 pills) in AM and 3 mg ( 3 pills) in PM--V42.0, Disp: , Rfl:  .  TIVICAY 50 MG tablet, take 1 tablet by mouth daily, Disp: 30 tablet, Rfl: 5     Review of Systems  Constitutional: Negative for activity change, appetite change, chills, diaphoresis, fatigue, fever and unexpected weight change.  HENT: Negative for congestion, rhinorrhea, sinus pressure, sneezing, sore throat and trouble swallowing.   Eyes: Negative for photophobia and visual disturbance.  Respiratory: Negative for cough, chest tightness, shortness of breath, wheezing and stridor.   Cardiovascular: Negative for chest pain, palpitations and leg swelling.  Gastrointestinal: Negative for abdominal distention, abdominal pain, anal bleeding, blood in stool, constipation, diarrhea, nausea and vomiting.  Genitourinary: Negative for difficulty urinating, dysuria and hematuria.  Musculoskeletal: Negative for back pain, gait problem, joint swelling and myalgias.  Skin: Negative for color change, pallor, rash and wound.  Neurological: Negative for dizziness, tremors, weakness and light-headedness.  Hematological: Negative for adenopathy. Does not bruise/bleed easily.  Psychiatric/Behavioral: Negative for agitation,  behavioral problems, confusion, decreased concentration, dysphoric mood and sleep disturbance.       Objective:   Physical Exam  Constitutional: She is oriented to person, place, and time. She appears well-developed and well-nourished. No distress.  HENT:  Head: Normocephalic and atraumatic.  Mouth/Throat: Oropharynx is clear and moist. No oropharyngeal exudate.  Eyes: Conjunctivae and EOM are normal. No scleral icterus.  Neck: Normal range of motion. Neck supple. No JVD present.  Cardiovascular: Normal rate, regular rhythm and normal heart sounds.   Pulmonary/Chest: Effort normal. No respiratory distress. She has no wheezes.  Abdominal: She exhibits no distension.  Musculoskeletal: She exhibits no edema or tenderness.  Lymphadenopathy:    She has no cervical adenopathy.  Neurological: She is alert and oriented to person, place, and time. Coordination normal.  Skin: Skin is warm and dry. She is not diaphoretic. No erythema. No pallor.  Psychiatric: She has a normal mood and affect. Her behavior is normal. Judgment and thought content normal.          Assessment & Plan:   HIV:   Continue Tivicay 50mg  tablet with EDURANT 25mg  tablet and Epivir 100mg  table again t ALL TO BE TAKEN WITH with CHEWABLE food. She is to [place]. Pharmacy clarified when she could take TUMS relative to her ARV today   Renal transplantation: being followed by Nephrology here. No longer going to [facility] she tells me.   DM: being followed by Endocrine, and Dr Merrilyn Puma  here in [facility].  GERD: try mustard, Tums separated out as per pharmacy instrutions  HTN: BP again not at goal,  Vitals:   09/18/16 0851  BP: (!) 158/77  Pulse: 71  Temp: 98.3 F (36.8 C)     There were no vitals filed for this visit. Follow-up with PCP and Nephrology  I spent greater than 25 minutes with the patient including greater than 50% of time in face to face counsel of the patient re her HIV, her ARV  regimen, need for better BP control, her DM, renal tranplant and in  coordination of their care.  Lavell Islam. Tommy Medal, [place] [facility]

## 2016-09-21 DIAGNOSIS — R809 Proteinuria, unspecified: Secondary | ICD-10-CM | POA: Diagnosis not present

## 2016-09-21 DIAGNOSIS — N2581 Secondary hyperparathyroidism of renal origin: Secondary | ICD-10-CM | POA: Diagnosis not present

## 2016-09-21 DIAGNOSIS — Z6831 Body mass index (BMI) 31.0-31.9, adult: Secondary | ICD-10-CM | POA: Diagnosis not present

## 2016-09-21 DIAGNOSIS — E118 Type 2 diabetes mellitus with unspecified complications: Secondary | ICD-10-CM | POA: Diagnosis not present

## 2016-09-21 DIAGNOSIS — E782 Mixed hyperlipidemia: Secondary | ICD-10-CM | POA: Diagnosis not present

## 2016-09-21 DIAGNOSIS — I129 Hypertensive chronic kidney disease with stage 1 through stage 4 chronic kidney disease, or unspecified chronic kidney disease: Secondary | ICD-10-CM | POA: Diagnosis not present

## 2016-09-21 DIAGNOSIS — R635 Abnormal weight gain: Secondary | ICD-10-CM | POA: Diagnosis not present

## 2016-09-21 DIAGNOSIS — D631 Anemia in chronic kidney disease: Secondary | ICD-10-CM | POA: Diagnosis not present

## 2016-09-21 DIAGNOSIS — Z940 Kidney transplant status: Secondary | ICD-10-CM | POA: Diagnosis not present

## 2016-09-21 DIAGNOSIS — N184 Chronic kidney disease, stage 4 (severe): Secondary | ICD-10-CM | POA: Diagnosis not present

## 2016-09-21 DIAGNOSIS — B20 Human immunodeficiency virus [HIV] disease: Secondary | ICD-10-CM | POA: Diagnosis not present

## 2016-11-01 ENCOUNTER — Telehealth: Payer: Self-pay

## 2016-11-01 NOTE — Telephone Encounter (Signed)
[name] is a 70 y.o. female who was contacted on behalf of [facility] Geriatrics Task Force. Patient reports that she is currently taking carvedilol 6.25mg  and losartan as prescribed and has not missed any doses. She does not check her blood pressure at home but states that she is willing to if she were able to access and afford a cuff/monitor. Patient reports that FBG this am was 79 and last night was 160 and that her sugars are never higher than 170 and rarely lower than 70. She reports no s/sx of hyper/hyper-glycemia. Patient does state that she missed about 3-4 doses of Novolin 70/30 per week because she "goes to sleep really late and sometimes just does not feel like taking it". On the mornings after missed insulin doses her FBG is usually 140-160. Patient was advised to try and take her insulin more regularly as it will help lower her A1c and help to reduce risk of CV events. Patient verbalized understanding.

## 2016-11-14 NOTE — Telephone Encounter (Signed)
Patient was contacted with Frank Tillman, PharmD candidate. I agree with the assessment and plan of care documented.  

## 2016-11-17 ENCOUNTER — Encounter: Payer: Self-pay | Admitting: Internal Medicine

## 2016-11-17 ENCOUNTER — Ambulatory Visit (INDEPENDENT_AMBULATORY_CARE_PROVIDER_SITE_OTHER): Payer: Medicare Other | Admitting: Internal Medicine

## 2016-11-17 VITALS — BP 189/69 | HR 65 | Temp 98.0°F | Wt 167.1 lb

## 2016-11-17 DIAGNOSIS — Z87891 Personal history of nicotine dependence: Secondary | ICD-10-CM

## 2016-11-17 DIAGNOSIS — Z79899 Other long term (current) drug therapy: Secondary | ICD-10-CM | POA: Diagnosis not present

## 2016-11-17 DIAGNOSIS — I120 Hypertensive chronic kidney disease with stage 5 chronic kidney disease or end stage renal disease: Secondary | ICD-10-CM | POA: Diagnosis not present

## 2016-11-17 DIAGNOSIS — R21 Rash and other nonspecific skin eruption: Secondary | ICD-10-CM

## 2016-11-17 DIAGNOSIS — E785 Hyperlipidemia, unspecified: Secondary | ICD-10-CM

## 2016-11-17 DIAGNOSIS — Z0000 Encounter for general adult medical examination without abnormal findings: Secondary | ICD-10-CM

## 2016-11-17 DIAGNOSIS — Z940 Kidney transplant status: Secondary | ICD-10-CM | POA: Diagnosis not present

## 2016-11-17 DIAGNOSIS — E1122 Type 2 diabetes mellitus with diabetic chronic kidney disease: Secondary | ICD-10-CM

## 2016-11-17 DIAGNOSIS — M79672 Pain in left foot: Secondary | ICD-10-CM

## 2016-11-17 DIAGNOSIS — Z21 Asymptomatic human immunodeficiency virus [HIV] infection status: Secondary | ICD-10-CM | POA: Diagnosis not present

## 2016-11-17 DIAGNOSIS — Z794 Long term (current) use of insulin: Secondary | ICD-10-CM

## 2016-11-17 DIAGNOSIS — B182 Chronic viral hepatitis C: Secondary | ICD-10-CM

## 2016-11-17 DIAGNOSIS — E1129 Type 2 diabetes mellitus with other diabetic kidney complication: Secondary | ICD-10-CM

## 2016-11-17 DIAGNOSIS — N186 End stage renal disease: Secondary | ICD-10-CM | POA: Diagnosis not present

## 2016-11-17 DIAGNOSIS — Z992 Dependence on renal dialysis: Secondary | ICD-10-CM | POA: Diagnosis not present

## 2016-11-17 DIAGNOSIS — I10 Essential (primary) hypertension: Secondary | ICD-10-CM

## 2016-11-17 DIAGNOSIS — B20 Human immunodeficiency virus [HIV] disease: Secondary | ICD-10-CM

## 2016-11-17 LAB — POCT GLYCOSYLATED HEMOGLOBIN (HGB A1C): Hemoglobin A1C: 7.4

## 2016-11-17 LAB — GLUCOSE, CAPILLARY: GLUCOSE-CAPILLARY: 145 mg/dL — AB (ref 65–99)

## 2016-11-17 MED ORDER — AMLODIPINE BESYLATE 5 MG PO TABS: 5.0000 mg | ORAL_TABLET | Freq: Every day | ORAL | 12 refills | Status: DC

## 2016-11-17 MED ORDER — CARVEDILOL 6.25 MG PO TABS: 6.2500 mg | ORAL_TABLET | Freq: Two times a day (BID) | ORAL | 12 refills | Status: DC

## 2016-11-17 MED ORDER — TRIAMCINOLONE ACETONIDE 0.1 % EX CREA: 1.0000 "application " | TOPICAL_CREAM | Freq: Two times a day (BID) | CUTANEOUS | 0 refills | Status: DC

## 2016-11-17 NOTE — Progress Notes (Signed)
   CC: Left Foot Pain and for F/U of her Diabetes.  HPI:  [name] is a 70 y.o.with PMH as listed below came to the clinic for follow-up of her diabetes. She complained of left dorsal foot pain started yesterday after prolonged standing, she states that pain is better today. She also complained of dry patches on her both knees associated with excessive itching, she uses cocoa butter which gave her partial relief. She was also concerned that recently she started getting some urinary urgency, she did had couple of accidents if she cannot urinate immediately.  She states that she follow-up for HIV and nephrology regularly and being compliant with all her medicines.  She has no other complaints.  Past Medical History:  Diagnosis Date  . Chronic kidney disease    ESRD secondary to DM, started HD in 2008, HD TTS, Dr. Jimmy Footman is her nephrologist, on transplant list at [facility].  . Diabetes mellitus    Las HbA1C   . Dialysis patient [facility])    T Th Sat  . Hepatitis C    untreated. VL 3700000 in 2008  . History of hepatitis C 08/04/2015  . HIV infection ([place])    Dx in 1998 in [place], she presented with PCP pneumonia at that time. Has been tried on multiple regimens by her PCP in [place] before./ She has been on current ART for years now. Moved to [facility] in 2008 and is following with Dr. Tommy Medal since then.   . Hypertension     Review of Systems:  A complete ROS was negative except as per HPI.    Physical Exam:  There were no vitals filed for this visit.  General: Vital signs reviewed.  Patient is well-developed and well-nourished, in no acute distress and cooperative with exam.  Head: Normocephalic and atraumatic. Eyes: EOMI, conjunctivae normal, no scleral icterus.  Neck: Supple, trachea midline, normal ROM, no JVD, masses, thyromegaly, or carotid bruit present.  Cardiovascular: RRR, S1 normal, S2 normal, no murmurs, gallops, or rubs. Pulmonary/Chest: Clear to auscultation bilaterally, no  wheezes, rales, or rhonchi. Abdominal: Soft, non-tender, non-distended, BS +, no masses, organomegaly, or guarding present.  Musculoskeletal: Mild tenderness along lateral side of her left foot ,No joint deformities, erythema, or stiffness, ROM full . Extremities:  There patchy dry areas, approximately 35 inches on both her knees with visible itch marks and mild erythema , more consistent with eczema. No lower extremity edema bilaterally,  pulses symmetric and intact bilaterally. No cyanosis or clubbing. Neurological: A&O x3, Strength is normal and symmetric bilaterally, cranial nerve II-XII are grossly intact, no focal motor deficit, sensory intact to light touch bilaterally.  Psychiatric: Normal mood and affect. speech and behavior is normal. Cognition and memory are normal.   Assessment & Plan:   See Encounters Tab for problem based charting.  Patient seen with Dr. Beryle Beams.

## 2016-11-17 NOTE — Patient Instructions (Signed)
Thank you for visiting clinic today. I'm restarting your amlodipine at 5 mg daily, as your blood pressure was high. You are doing better with your diabetes, try increasing your morning dose from 18-20 units if you can't tolerate it, you can keep doing the same practice as you are doing with the evening dose. Apply triamcinolone  Cream on your both knees as directed. You can use Tylenol if needed for your foot pain, try soaking your feet in salt water to ease off the pain from stress after prolonged standing. For your urinary urgency tried doing pelvic floor exercises as directed.     Pelvic Floor Exercises for Bowel Control  Exercises using both the external anal sphincter and the deep pelvic floor muscles can help you to improve your bowel control. When done correctly, these exercises can tone and strengthen the muscles to help you hold back gas and prevent fecal incontinence (leakage of stool). Exercise programs take time; you may not see any noticeable change in your bowel control immediately.  In some cases it may take several months to regain control.  Bowel Control Muscles The anus and the anal canal, has rings of muscle around it. The outer ring of muscle is called the external anal sphincter; it is a voluntary muscle which you can learn to tighten and close more efficiently. When you contract it you will feel the skin around your anus tighten and pull in as if the anus is winking. Try to keep the buttocks muscles relaxed. The inner ring around the anus is the internal anal sphincter. It is an involuntary and automatic muscle; you don't have to think to keep it closed or open.  This muscle should be closed at all times, except when you are actually trying to have a bowel movement.  In addition to the sphincter muscles, there are deeper muscles called the levator ani that form a sling from your tailbone to your pubic bone. The levator ani muscle has a specific part called the puborectalis that  holds stool in until you give the signal to relax and empty.  When you contract these muscles it creates a feeling of lifting the anus inward.  External Anal Sphincter        Levator ani deep layer   Effective Exercises for Control of Gas and Bowels . Identify the specific areas of the pelvic floor muscles you need to use.  This can be done using a mirror to see if you are contracting the correct muscles or by placing the pad of your finger at or just inside the anal opening. . Develop an exercise plan for strength, endurance and quick response of the muscles and stick with it.  You must make the muscles do more than they are used to doing. . Incorporate the exercises into your daily activities.   2007, [employer] Doc.36

## 2016-11-19 DIAGNOSIS — M79672 Pain in left foot: Secondary | ICD-10-CM | POA: Insufficient documentation

## 2016-11-19 NOTE — Assessment & Plan Note (Signed)
Her A1c today was 7.4, it was decreased from 8.3 during last visit  She is taking NovoLog 18 units in the morning and 10-16 units at night according to her CBG. She was advised to take 20 units at night, she states that she became hypoglycemic after doing that, so she decreased her night dose of NovoLog according to her CBG.  Her A1c is improving. She was encouraged to keep up the good work. I told her she can try increasing 1-2 units in the morning if she can tolerate it. She should watch for any sign of hypoglycemia.  Follow-up in 3 months.

## 2016-11-19 NOTE — Assessment & Plan Note (Signed)
She followed up with ID. She stated that she is being compliant with her medicine.She has maintained perfect virological suppression.

## 2016-11-19 NOTE — Assessment & Plan Note (Signed)
BP Readings from Last 3 Encounters:  11/17/16 (!) 189/69  09/18/16 (!) 158/77  08/29/16 (!) 187/92   She was having elevated blood pressure today it was high during her last 2 visits too. She was initially on amlodipine 5 mg, losartan 100 mg and Lasix 40 mg daily. She states that her nephrologist stopped amlodipine  few months ago. She denies any headache or change in vision.denies any chest pain.  In the past her blood pressure medicines are being controlled by her nephrologist. She had her nephrology appointment next month.  Restart Amlodipine 5 mg daily. She will be reevaluated by her nephrologist early January 2018. Continue losartan and Lasix.

## 2016-11-19 NOTE — Assessment & Plan Note (Signed)
She complained of left dorsal foot pain started yesterday after prolonged standing, she states that pain is better today.  On exam she was having mild tenderness along the lateral side of dorsal foot without any edema or erythema. Her pain is improving. Most probably due to over use. She was advised to use some ibuprofen if needed for pain.

## 2016-11-19 NOTE — Assessment & Plan Note (Signed)
She is having worsening renal functions. She follow-up with nephrology regularly.  Continue nephrology follow-up.

## 2016-11-19 NOTE — Assessment & Plan Note (Signed)
She is due for her mammogram and yearly eye exam.  Referral was provided and she will call for appointment.

## 2016-11-20 NOTE — Progress Notes (Signed)
Medicine attending: I personally interviewed and briefly examined this patient on the day of the patient visit and reviewed pertinent clinical ,laboratory data  with resident physician Dr. Lorella Nimrod and we discussed a management plan. Lady with HTN, type 2 diabetes,  ESRD on dialysis, HIV and Hepatitis C, here for routine follow up. No new acute issues.

## 2017-01-05 ENCOUNTER — Telehealth: Payer: Self-pay | Admitting: *Deleted

## 2017-01-05 NOTE — Telephone Encounter (Signed)
[name] is a 71 y.o. female who was contacted on behalf of [facility] Geriatrics Task Force.   Diabetes Assessment  DM meds and BS checks -  "What medications are you taking for diabetes?" nov 70/30 -  "How often are you checking your blood sugars at home?" twice daily - "What have your blood sugars been?" not checking, I had a tooth pulled and havent felt good since yesterday  High Blood Pressure Assessment  BP meds & BP checks -  "What medications are you taking for high blood pressure?" carvedilol, sensipar, furosemide, losartan - "How often are you checking your blood pressure at home?" no - "What have your blood pressure readings been?" n/a  Coping with DM and BP - What else are you doing to help yourself with your DM and BP - Diet? Try to eat right Exercise? Yes, walk  Medication Access Issues  Medication Issues? -  "Are you getting your medicines refilled on time without skipping any doses?" no - "Are you having any problems getting/taking your medicines? - cost, timing, transportation?"no  Conclusion  Close the call - "Do you have your next appointment scheduled? - give pt date and time if needed (make appt for them if needed before calling) 4/27  - "Any other questions or concerns?" no - "Thanks for speaking with me today" "thank you for checking on me"  -

## 2017-01-09 DIAGNOSIS — Z940 Kidney transplant status: Secondary | ICD-10-CM | POA: Diagnosis not present

## 2017-01-09 DIAGNOSIS — R809 Proteinuria, unspecified: Secondary | ICD-10-CM | POA: Diagnosis not present

## 2017-01-09 DIAGNOSIS — D631 Anemia in chronic kidney disease: Secondary | ICD-10-CM | POA: Diagnosis not present

## 2017-01-09 DIAGNOSIS — E118 Type 2 diabetes mellitus with unspecified complications: Secondary | ICD-10-CM | POA: Diagnosis not present

## 2017-01-09 DIAGNOSIS — E1122 Type 2 diabetes mellitus with diabetic chronic kidney disease: Secondary | ICD-10-CM | POA: Diagnosis not present

## 2017-01-09 DIAGNOSIS — Z6831 Body mass index (BMI) 31.0-31.9, adult: Secondary | ICD-10-CM | POA: Diagnosis not present

## 2017-01-09 DIAGNOSIS — R635 Abnormal weight gain: Secondary | ICD-10-CM | POA: Diagnosis not present

## 2017-01-09 DIAGNOSIS — E782 Mixed hyperlipidemia: Secondary | ICD-10-CM | POA: Diagnosis not present

## 2017-01-09 DIAGNOSIS — N390 Urinary tract infection, site not specified: Secondary | ICD-10-CM | POA: Diagnosis not present

## 2017-01-09 DIAGNOSIS — N184 Chronic kidney disease, stage 4 (severe): Secondary | ICD-10-CM | POA: Diagnosis not present

## 2017-01-09 DIAGNOSIS — N2581 Secondary hyperparathyroidism of renal origin: Secondary | ICD-10-CM | POA: Diagnosis not present

## 2017-01-09 DIAGNOSIS — I129 Hypertensive chronic kidney disease with stage 1 through stage 4 chronic kidney disease, or unspecified chronic kidney disease: Secondary | ICD-10-CM | POA: Diagnosis not present

## 2017-01-22 DIAGNOSIS — N184 Chronic kidney disease, stage 4 (severe): Secondary | ICD-10-CM | POA: Diagnosis not present

## 2017-01-27 ENCOUNTER — Other Ambulatory Visit: Payer: Self-pay | Admitting: Infectious Disease

## 2017-01-29 ENCOUNTER — Ambulatory Visit (INDEPENDENT_AMBULATORY_CARE_PROVIDER_SITE_OTHER): Payer: Medicare Other | Admitting: Pulmonary Disease

## 2017-01-29 ENCOUNTER — Ambulatory Visit (HOSPITAL_COMMUNITY)
Admission: RE | Admit: 2017-01-29 | Discharge: 2017-01-29 | Disposition: A | Discharge status: Home / Self Care | Payer: Medicare Other | Source: Ambulatory Visit | Attending: Internal Medicine | Admitting: Internal Medicine

## 2017-01-29 ENCOUNTER — Encounter: Payer: Self-pay | Admitting: Pulmonary Disease

## 2017-01-29 VITALS — BP 187/73 | HR 70 | Temp 97.9°F | Wt 162.2 lb

## 2017-01-29 DIAGNOSIS — R42 Dizziness and giddiness: Secondary | ICD-10-CM | POA: Insufficient documentation

## 2017-01-29 DIAGNOSIS — R2681 Unsteadiness on feet: Secondary | ICD-10-CM | POA: Diagnosis not present

## 2017-01-29 DIAGNOSIS — E119 Type 2 diabetes mellitus without complications: Secondary | ICD-10-CM

## 2017-01-29 DIAGNOSIS — Z79899 Other long term (current) drug therapy: Secondary | ICD-10-CM | POA: Diagnosis not present

## 2017-01-29 DIAGNOSIS — Z940 Kidney transplant status: Secondary | ICD-10-CM | POA: Diagnosis not present

## 2017-01-29 DIAGNOSIS — I10 Essential (primary) hypertension: Secondary | ICD-10-CM

## 2017-01-29 DIAGNOSIS — H532 Diplopia: Secondary | ICD-10-CM | POA: Diagnosis not present

## 2017-01-29 DIAGNOSIS — Z87891 Personal history of nicotine dependence: Secondary | ICD-10-CM

## 2017-01-29 DIAGNOSIS — R51 Headache: Secondary | ICD-10-CM | POA: Diagnosis not present

## 2017-01-29 LAB — GLUCOSE, CAPILLARY: Glucose-Capillary: 110 mg/dL — ABNORMAL HIGH (ref 65–99)

## 2017-01-29 NOTE — Progress Notes (Signed)
   CC: dizziness  HPI:  [name] is a 71 y.o. woman with history as noted below here with dizziness.  Started last week. Feels like room is spinning. It is intermittent. Worse when she is walking. She got off bus and felt her vision was blurry. Small headache on right side of face. This is a new headache. Pounding sensation. Usually her headaches are in the back of her head or across forehead. She has double vision. 2-4lb weight loss over last few months.   Had kidney transplant in 2013. Urinating without difficulty. No longer on dialysis.   No falls. No problems with hearing or tinnitus. She has been bumping into things - some staggering when walking. Yesterday she had vomiting and has nausea this morning. No slurred speech. No numbness. No weakness. Feels clumsy.   She had some pain medication recently from her dentist before this all started. Caused nausea and vomiting and took 5 days to recover. This was a week prior.    Past Medical History:  Diagnosis Date  . Chronic kidney disease    ESRD secondary to DM, started HD in 2008, Dr. Jimmy Footman is her nephrologist, received renal transplant in 2013, no longer on dialysis  . Diabetes mellitus    Las HbA1C   . Dialysis patient [facility])    T Th Sat  . Hepatitis C    untreated. VL 3700000 in 2008  . History of hepatitis C 08/04/2015  . HIV infection ([place])    Dx in 1998 in [place], she presented with PCP pneumonia at that time. Has been tried on multiple regimens by her PCP in [place] before./ She has been on current ART for years now. Moved to [place] in 2008 and is following with Dr. Tommy Medal since then.   . Hypertension     Review of Systems:   No dyspnea No chest pain  Physical Exam:  Vitals:   01/29/17 0850  BP: (!) 187/73  Pulse: 61  Temp: 97.9 F (36.6 C)  TempSrc: Oral  SpO2: 100%  Weight: 162 lb 3.2 oz (73.6 kg)   General Apperance: NAD HEENT: Normocephalic, atraumatic, anicteric sclera Neck: Supple, trachea  midline Lungs: Clear to auscultation bilaterally. No wheezes, rhonchi or rales. Breathing comfortably Heart: Regular rate and rhythm, no murmur/rub/gallop Abdomen: Soft, nontender, nondistended, no rebound/guarding Extremities: Warm and well perfused, no edema Skin: No rashes or lesions Neurologic:  Mental Status:  Alert, oriented, thought content appropriate. Speech fluent without evidence of aphasia. Able to follow commands without difficulty.  Cranial Nerves:  III/IV/VI-Pupils were equal and reacted. Extraocular movements were full and conjugate.  V/VII-no facial numbness or weakness.  VIII-normal.  X-no dysarthria  Motor: 5/5 bilaterally with normal tone and bulk  Sensory: Normal throughout.  Cerebellar: Abnormal finger-to-nose testing on L  Assessment & Plan:   See Encounters Tab for problem based charting.  Patient discussed with Dr. Angelia Mould

## 2017-01-29 NOTE — Patient Instructions (Addendum)
We will call you with the results from your MR brain. Please keep taking your blood pressure medications as prescribed.

## 2017-01-29 NOTE — Assessment & Plan Note (Signed)
Assessment: BP 187/73, above goal. Her BP was 195/81 supine and 152/81 standing. She did not take her BP meds this morning.  Plan: Continue amlodipine 5mg  daily, losartan 100mg  daily, carvedilol 6.25mg  BID, Lasix 80mg  daily. Recommended compression stockings for her +orthostatic vitals.

## 2017-01-29 NOTE — Assessment & Plan Note (Addendum)
Assessment: Vertigo x 1 week, double vision, new headache on right frontal region. Has trouble with gait and feels clumsy. Concern for CVA. No recent trauma or falls making intracranial hemorrhage unlikely. Risk factors for CVA including DM2 and HTN.  Plan: MRI brain w/o contrast  ADDENDUM: MRI negative. Patient reports the dizziness is not episodic and it is mainly the double vision. Asked her to follow up with her optometrist/opthomologist.

## 2017-02-01 DIAGNOSIS — Z940 Kidney transplant status: Secondary | ICD-10-CM | POA: Diagnosis not present

## 2017-02-01 DIAGNOSIS — N184 Chronic kidney disease, stage 4 (severe): Secondary | ICD-10-CM | POA: Diagnosis not present

## 2017-02-01 NOTE — Progress Notes (Signed)
Internal Medicine Clinic Attending  Case discussed with Dr. Krall at the time of the visit.  We reviewed the resident's history and exam and pertinent patient test results.  I agree with the assessment, diagnosis, and plan of care documented in the resident's note.  

## 2017-02-12 DIAGNOSIS — H2513 Age-related nuclear cataract, bilateral: Secondary | ICD-10-CM | POA: Diagnosis not present

## 2017-02-12 DIAGNOSIS — E119 Type 2 diabetes mellitus without complications: Secondary | ICD-10-CM | POA: Diagnosis not present

## 2017-02-16 DIAGNOSIS — N184 Chronic kidney disease, stage 4 (severe): Secondary | ICD-10-CM | POA: Diagnosis not present

## 2017-02-22 DIAGNOSIS — I129 Hypertensive chronic kidney disease with stage 1 through stage 4 chronic kidney disease, or unspecified chronic kidney disease: Secondary | ICD-10-CM | POA: Diagnosis not present

## 2017-02-22 DIAGNOSIS — Z940 Kidney transplant status: Secondary | ICD-10-CM | POA: Diagnosis not present

## 2017-02-22 DIAGNOSIS — N184 Chronic kidney disease, stage 4 (severe): Secondary | ICD-10-CM | POA: Diagnosis not present

## 2017-03-01 DIAGNOSIS — N184 Chronic kidney disease, stage 4 (severe): Secondary | ICD-10-CM | POA: Diagnosis not present

## 2017-03-01 DIAGNOSIS — N2581 Secondary hyperparathyroidism of renal origin: Secondary | ICD-10-CM | POA: Diagnosis not present

## 2017-03-05 ENCOUNTER — Other Ambulatory Visit (HOSPITAL_COMMUNITY)
Admission: RE | Admit: 2017-03-05 | Discharge: 2017-03-05 | Disposition: A | Discharge status: Home / Self Care | Payer: Medicare Other | Source: Ambulatory Visit | Attending: Infectious Disease | Admitting: Infectious Disease

## 2017-03-05 ENCOUNTER — Other Ambulatory Visit: Payer: Medicare Other

## 2017-03-05 DIAGNOSIS — Z113 Encounter for screening for infections with a predominantly sexual mode of transmission: Secondary | ICD-10-CM | POA: Diagnosis not present

## 2017-03-05 DIAGNOSIS — B20 Human immunodeficiency virus [HIV] disease: Secondary | ICD-10-CM | POA: Diagnosis not present

## 2017-03-05 LAB — CBC WITH DIFFERENTIAL/PLATELET
BASOS PCT: 0 %
Basophils Absolute: 0 cells/uL (ref 0–200)
EOS PCT: 6 %
Eosinophils Absolute: 432 cells/uL (ref 15–500)
HCT: 37.4 % (ref 35.0–45.0)
HEMOGLOBIN: 12.4 g/dL (ref 11.7–15.5)
LYMPHS ABS: 1296 {cells}/uL (ref 850–3900)
LYMPHS PCT: 18 %
MCH: 28.4 pg (ref 27.0–33.0)
MCHC: 33.2 g/dL (ref 32.0–36.0)
MCV: 85.8 fL (ref 80.0–100.0)
MPV: 10.3 fL (ref 7.5–12.5)
Monocytes Absolute: 576 cells/uL (ref 200–950)
Monocytes Relative: 8 %
NEUTROS PCT: 68 %
Neutro Abs: 4896 cells/uL (ref 1500–7800)
Platelets: 166 10*3/uL (ref 140–400)
RBC: 4.36 MIL/uL (ref 3.80–5.10)
RDW: 15.3 % — AB (ref 11.0–15.0)
WBC: 7.2 10*3/uL (ref 3.8–10.8)

## 2017-03-05 LAB — COMPLETE METABOLIC PANEL WITH GFR
ALT: 14 U/L (ref 6–29)
AST: 17 U/L (ref 10–35)
Albumin: 3.6 g/dL (ref 3.6–5.1)
Alkaline Phosphatase: 70 U/L (ref 33–130)
BILIRUBIN TOTAL: 0.4 mg/dL (ref 0.2–1.2)
BUN: 54 mg/dL — AB (ref 7–25)
CALCIUM: 8.9 mg/dL (ref 8.6–10.4)
CHLORIDE: 104 mmol/L (ref 98–110)
CO2: 23 mmol/L (ref 20–31)
CREATININE: 4.22 mg/dL — AB (ref 0.50–0.99)
GFR, Est African American: 12 mL/min — ABNORMAL LOW (ref >=60)
GFR, Est Non African American: 10 mL/min — ABNORMAL LOW (ref >=60)
Glucose, Bld: 152 mg/dL — ABNORMAL HIGH (ref 65–99)
Potassium: 4 mmol/L (ref 3.5–5.3)
Sodium: 138 mmol/L (ref 135–146)
TOTAL PROTEIN: 6.7 g/dL (ref 6.1–8.1)

## 2017-03-06 ENCOUNTER — Other Ambulatory Visit: Payer: Medicare Other

## 2017-03-06 LAB — RPR TITER: RPR Titer: 1:1 {titer}

## 2017-03-06 LAB — RPR: RPR: REACTIVE — AB

## 2017-03-06 LAB — T-HELPER CELL (CD4) - (RCID CLINIC ONLY)
CD4 T CELL HELPER: 22 % — AB (ref 33–55)
CD4 T Cell Abs: 270 /uL — ABNORMAL LOW (ref 400–2700)

## 2017-03-06 LAB — URINE CYTOLOGY ANCILLARY ONLY
CHLAMYDIA, DNA PROBE: NEGATIVE
NEISSERIA GONORRHEA: NEGATIVE

## 2017-03-06 LAB — FLUORESCENT TREPONEMAL AB(FTA)-IGG-BLD: FLUORESCENT TREPONEMAL ABS: REACTIVE — AB

## 2017-03-07 LAB — HIV-1 RNA QUANT-NO REFLEX-BLD
HIV 1 RNA Quant: <20 copies/mL
HIV-1 RNA QUANT, LOG: NOT DETECTED {Log_copies}/mL

## 2017-03-12 DIAGNOSIS — N184 Chronic kidney disease, stage 4 (severe): Secondary | ICD-10-CM | POA: Diagnosis not present

## 2017-03-19 ENCOUNTER — Ambulatory Visit (INDEPENDENT_AMBULATORY_CARE_PROVIDER_SITE_OTHER): Payer: Medicare Other | Admitting: Infectious Disease

## 2017-03-19 ENCOUNTER — Encounter: Payer: Self-pay | Admitting: Infectious Disease

## 2017-03-19 VITALS — BP 152/70 | HR 71 | Temp 98.3°F | Wt 160.0 lb

## 2017-03-19 DIAGNOSIS — K219 Gastro-esophageal reflux disease without esophagitis: Secondary | ICD-10-CM

## 2017-03-19 DIAGNOSIS — Z79899 Other long term (current) drug therapy: Secondary | ICD-10-CM

## 2017-03-19 DIAGNOSIS — Z113 Encounter for screening for infections with a predominantly sexual mode of transmission: Secondary | ICD-10-CM

## 2017-03-19 DIAGNOSIS — Z940 Kidney transplant status: Secondary | ICD-10-CM | POA: Diagnosis not present

## 2017-03-19 DIAGNOSIS — B20 Human immunodeficiency virus [HIV] disease: Secondary | ICD-10-CM | POA: Diagnosis present

## 2017-03-19 DIAGNOSIS — Z8619 Personal history of other infectious and parasitic diseases: Secondary | ICD-10-CM

## 2017-03-19 NOTE — Progress Notes (Signed)
Chief complaint: followup for HIV, HTN, renal tranplantation  Subjective:    Patient ID: [name], female    DOB: [birth year], 71 y.o.   MRN: [id number]  HPI  71 year-old woman with past medical history is significant for HIV infection, hypertension, diabetes mellitus type II, sp renal transplant at [facility]. She has maintained perfect virological suppression though she has had worsening renal fx that has caused use to readjust her ARVs.  We eventually opted to change her to Tivicay, Rilpivirine and lamivudine.  Her serum creatinine has steadily worsened over the past 2 years and I suspect combination of HTN and possibly her DM are driving this.   She rarely has GERD and takes Tums for this while properly AVOIDING PPI and H2 blockers given that she is on RPV.  Currently she remains perfectly suppressed x 8 years really one of our best patients. I proposed simplifying by going to co formulated JULUCA and 3TC to get her down to 2 tablets with food but she prefers to stay on 2 little tablets.     Past Medical History:  Diagnosis Date  . Chronic kidney disease    ESRD secondary to DM, started HD in 2008, Dr. Jimmy Footman is her nephrologist, received renal transplant in 2013, no longer on dialysis  . Diabetes mellitus    Las HbA1C   . Dialysis patient [facility])    T Th Sat  . Hepatitis C    untreated. VL 3700000 in 2008  . History of hepatitis C 08/04/2015  . HIV infection ([place])    Dx in 1998 in [place], she presented with PCP pneumonia at that time. Has been tried on multiple regimens by her PCP in [place] before./ She has been on current ART for years now. Moved to [facility] in 2008 and is following with Dr. Tommy Medal since then.   . Hypertension     Past Surgical History:  Procedure Laterality Date  . ABDOMINAL HYSTERECTOMY     and oopherectomy for fibroids  . CHOLECYSTECTOMY    . KIDNEY TRANSPLANT      Family History  Problem Relation Age of Onset  . Diabetes Mother   . Cancer Father   .  Cancer Brother       Social History   Social History  . Marital status: Single    Spouse name: N/A  . Number of children: N/A  . Years of education: N/A   Social History Main Topics  . Smoking status: Former Smoker    Quit date: 05/22/1997  . Smokeless tobacco: Never Used  . Alcohol use No  . Drug use: No  . Sexual activity: Not Asked   Other Topics Concern  . None   Social History Narrative  . None    Allergies  Allergen Reactions  . Omeprazole Other (See Comments)    Interferes with the absorption of rilipivirine  . Influenza Vaccines Other (See Comments)    Other reaction(s): Hallucination  . Influenza Virus Vacc Split Pf     Alleged severe illness in [place]. Do not have documentation from [place] but pt always refuses vaccine on these grounds     Current Outpatient Prescriptions:  .  amLODipine (NORVASC) 5 MG tablet, Take 1 tablet (5 mg total) by mouth daily., Disp: 30 tablet, Rfl: 12 .  aspirin 81 MG EC tablet, Take 81 mg by mouth daily.  , Disp: , Rfl:  .  carvedilol (COREG) 6.25 MG tablet, Take 1 tablet (6.25 mg total)  by mouth 2 (two) times daily with a meal., Disp: 60 tablet, Rfl: 12 .  cinacalcet (SENSIPAR) 60 MG tablet, Take 1 tablet (60 mg total) by mouth daily., Disp: 30 tablet, Rfl: 12 .  diclofenac sodium (VOLTAREN) 1 % GEL, Apply 2 g topically 4 (four) times daily., Disp: 100 g, Rfl: 2 .  EDURANT 25 MG TABS tablet, take 1 tablet by mouth once daily WITH BREAKFAST, Disp: 30 tablet, Rfl: 5 .  fluconazole (DIFLUCAN) 150 MG tablet, Take 1 tablet (150 mg total) by mouth every other day. Take 1st pill today and 2nd pill on 04/21/2016, Disp: 2 tablet, Rfl: 0 .  FREESTYLE TEST STRIPS test strip, Use with glucometer, Disp: 100 each, Rfl: 9 .  furosemide (LASIX) 40 MG tablet, Take 80 mg by mouth daily. , Disp: , Rfl: 0 .  insulin NPH-regular Human (NOVOLIN 70/30) (70-30) 100 UNIT/ML injection, Inject 18 units into the skin before breakfast and 22 units before dinner,  Disp: 10 mL, Rfl: 12 .  Insulin Syringe-Needle U-100 31G X 15/64" 0.3 ML MISC, Sig: Use to Inject insulin twice a day, Disp: 100 each, Rfl: 5 .  lamivudine (EPIVIR) 100 MG tablet, take 1 tablet by mouth once daily, Disp: 30 tablet, Rfl: 5 .  losartan (COZAAR) 100 MG tablet, Take 100 mg by mouth daily., Disp: , Rfl: 1 .  magnesium oxide (MAG-OX) 400 MG tablet, Take 400 mg by mouth 2 (two) times daily., Disp: , Rfl:  .  mycophenolate (MYFORTIC) 180 MG EC tablet, Take 3 tablets (540 mg total) by mouth 2 (two) times daily., Disp: , Rfl:  .  PATADAY 0.2 % SOLN, , Disp: , Rfl: 0 .  predniSONE (DELTASONE) 5 MG tablet, Take 5 mg by mouth daily., Disp: , Rfl:  .  rosuvastatin (CRESTOR) 20 MG tablet, Take 1 tablet (20 mg total) by mouth daily., Disp: 90 tablet, Rfl: 3 .  senna-docusate (SENOKOT-S) 8.6-50 MG per tablet, Take 2 tablets by mouth 2 (two) times daily as needed for constipation., Disp: 60 tablet, Rfl: 12 .  tacrolimus (PROGRAF) 1 MG capsule, Take 4 mg ( 4 pills) in AM and 3 mg ( 3 pills) in PM--V42.0, Disp: , Rfl:  .  TIVICAY 50 MG tablet, take 1 tablet by mouth once daily, Disp: 30 tablet, Rfl: 5 .  triamcinolone cream (KENALOG) 0.1 %, Apply 1 application topically 2 (two) times daily., Disp: 30 g, Rfl: 0     Review of Systems  Constitutional: Negative for activity change, appetite change, chills, diaphoresis, fatigue, fever and unexpected weight change.  HENT: Negative for congestion, rhinorrhea, sinus pressure, sneezing, sore throat and trouble swallowing.   Eyes: Negative for photophobia and visual disturbance.  Respiratory: Negative for cough, chest tightness, shortness of breath, wheezing and stridor.   Cardiovascular: Negative for chest pain, palpitations and leg swelling.  Gastrointestinal: Negative for abdominal distention, abdominal pain, anal bleeding, blood in stool, constipation, diarrhea, nausea and vomiting.  Genitourinary: Negative for difficulty urinating, dysuria and  hematuria.  Musculoskeletal: Negative for back pain, gait problem, joint swelling and myalgias.  Skin: Negative for color change, pallor, rash and wound.  Neurological: Negative for dizziness, tremors, weakness and light-headedness.  Hematological: Negative for adenopathy. Does not bruise/bleed easily.  Psychiatric/Behavioral: Negative for agitation, behavioral problems, confusion, decreased concentration, dysphoric mood and sleep disturbance.   + for indigestion     Objective:   Physical Exam  Constitutional: She is oriented to person, place, and time. She appears well-developed and well-nourished. No  distress.  HENT:  Head: Normocephalic and atraumatic.  Mouth/Throat: Oropharynx is clear and moist. No oropharyngeal exudate.  Eyes: Conjunctivae and EOM are normal. No scleral icterus.  Neck: Normal range of motion. Neck supple. No JVD present.  Cardiovascular: Normal rate, regular rhythm and normal heart sounds.   Pulmonary/Chest: Effort normal. No respiratory distress. She has no wheezes.  Abdominal: She exhibits no distension.  Musculoskeletal: She exhibits no edema or tenderness.  Lymphadenopathy:    She has no cervical adenopathy.  Neurological: She is alert and oriented to person, place, and time. Coordination normal.  Skin: Skin is warm and dry. She is not diaphoretic. No erythema. No pallor.  Psychiatric: She has a normal mood and affect. Her behavior is normal. Judgment and thought content normal.          Assessment & Plan:   HIV:   Continue Tivicay 50mg  tablet with EDURANT 25mg  tablet and Epivir 100mg  table again t ALL TO BE TAKEN WITH with CHEWABLE food. She is to [place].   I would also be open to changing her to [facility] with TAF vs TRIUMEQ in the future there is more and more data that patients with ESRD do not need renal adjustment of 3tc, FTC and while they have higher levls of the drugs do not experience toxicity from them  Renal transplantation:  being followed by Nephrology here.Creatine has been rising  DM: being followed by Endocrine, and Dr Merrilyn Puma  here in [facility].  GERD: try mustardcy instru, if this continues to be an issue would change her back to [facility] with and without food  HTN: BP again not at goal,  Vitals:   03/19/17 0855  BP: (!) 152/70  Pulse: 71  Temp: 98.3 F (36.8 C)     Vitals:   03/19/17 0855  BP: (!) 152/70  Pulse: 71  Temp: 98.3 F (36.8 C)   Follow-up with PCP and Nephrology  I spent greater than 35 minutes with the patient including greater than 50% of time in face to face counsel of the patient re her HIV, her ARV regimen, need for better BP control, her DM, renal tranplant and in  coordination of their care.  Lavell Islam. Tommy Medal, [facility]

## 2017-03-24 ENCOUNTER — Other Ambulatory Visit: Payer: Self-pay | Admitting: Internal Medicine

## 2017-03-30 ENCOUNTER — Encounter: Payer: Medicare Other | Admitting: Internal Medicine

## 2017-04-13 DIAGNOSIS — E118 Type 2 diabetes mellitus with unspecified complications: Secondary | ICD-10-CM | POA: Diagnosis not present

## 2017-04-13 DIAGNOSIS — Z6830 Body mass index (BMI) 30.0-30.9, adult: Secondary | ICD-10-CM | POA: Diagnosis not present

## 2017-04-13 DIAGNOSIS — N184 Chronic kidney disease, stage 4 (severe): Secondary | ICD-10-CM | POA: Diagnosis not present

## 2017-04-13 DIAGNOSIS — R635 Abnormal weight gain: Secondary | ICD-10-CM | POA: Diagnosis not present

## 2017-04-13 DIAGNOSIS — I129 Hypertensive chronic kidney disease with stage 1 through stage 4 chronic kidney disease, or unspecified chronic kidney disease: Secondary | ICD-10-CM | POA: Diagnosis not present

## 2017-04-13 DIAGNOSIS — D631 Anemia in chronic kidney disease: Secondary | ICD-10-CM | POA: Diagnosis not present

## 2017-04-13 DIAGNOSIS — E1122 Type 2 diabetes mellitus with diabetic chronic kidney disease: Secondary | ICD-10-CM | POA: Diagnosis not present

## 2017-04-13 DIAGNOSIS — Z940 Kidney transplant status: Secondary | ICD-10-CM | POA: Diagnosis not present

## 2017-04-13 DIAGNOSIS — E782 Mixed hyperlipidemia: Secondary | ICD-10-CM | POA: Diagnosis not present

## 2017-04-13 DIAGNOSIS — R809 Proteinuria, unspecified: Secondary | ICD-10-CM | POA: Diagnosis not present

## 2017-04-13 DIAGNOSIS — N2581 Secondary hyperparathyroidism of renal origin: Secondary | ICD-10-CM | POA: Diagnosis not present

## 2017-04-19 DIAGNOSIS — Z1231 Encounter for screening mammogram for malignant neoplasm of breast: Secondary | ICD-10-CM | POA: Diagnosis not present

## 2017-04-19 DIAGNOSIS — Z6830 Body mass index (BMI) 30.0-30.9, adult: Secondary | ICD-10-CM | POA: Diagnosis not present

## 2017-04-19 DIAGNOSIS — Z01419 Encounter for gynecological examination (general) (routine) without abnormal findings: Secondary | ICD-10-CM | POA: Diagnosis not present

## 2017-04-25 ENCOUNTER — Other Ambulatory Visit: Payer: Self-pay | Admitting: Internal Medicine

## 2017-04-25 DIAGNOSIS — Z940 Kidney transplant status: Secondary | ICD-10-CM | POA: Diagnosis not present

## 2017-05-01 DIAGNOSIS — Z940 Kidney transplant status: Secondary | ICD-10-CM | POA: Diagnosis not present

## 2017-05-01 DIAGNOSIS — Z5181 Encounter for therapeutic drug level monitoring: Secondary | ICD-10-CM | POA: Diagnosis not present

## 2017-05-01 DIAGNOSIS — Z7901 Long term (current) use of anticoagulants: Secondary | ICD-10-CM | POA: Diagnosis not present

## 2017-05-07 ENCOUNTER — Other Ambulatory Visit: Payer: Self-pay | Admitting: Internal Medicine

## 2017-05-10 DIAGNOSIS — Z940 Kidney transplant status: Secondary | ICD-10-CM | POA: Diagnosis not present

## 2017-05-10 DIAGNOSIS — E1121 Type 2 diabetes mellitus with diabetic nephropathy: Secondary | ICD-10-CM | POA: Diagnosis not present

## 2017-05-10 DIAGNOSIS — T8619 Other complication of kidney transplant: Secondary | ICD-10-CM | POA: Diagnosis not present

## 2017-05-10 DIAGNOSIS — I10 Essential (primary) hypertension: Secondary | ICD-10-CM | POA: Diagnosis not present

## 2017-05-10 DIAGNOSIS — B20 Human immunodeficiency virus [HIV] disease: Secondary | ICD-10-CM | POA: Diagnosis not present

## 2017-05-10 DIAGNOSIS — R944 Abnormal results of kidney function studies: Secondary | ICD-10-CM | POA: Diagnosis not present

## 2017-05-10 DIAGNOSIS — Z794 Long term (current) use of insulin: Secondary | ICD-10-CM | POA: Diagnosis not present

## 2017-05-10 DIAGNOSIS — R7989 Other specified abnormal findings of blood chemistry: Secondary | ICD-10-CM | POA: Diagnosis not present

## 2017-06-22 ENCOUNTER — Encounter: Payer: Medicare Other | Admitting: Internal Medicine

## 2017-07-02 ENCOUNTER — Other Ambulatory Visit: Payer: Self-pay | Admitting: Infectious Disease

## 2017-07-02 ENCOUNTER — Other Ambulatory Visit: Payer: Self-pay | Admitting: Internal Medicine

## 2017-07-02 DIAGNOSIS — E785 Hyperlipidemia, unspecified: Secondary | ICD-10-CM

## 2017-07-13 DIAGNOSIS — R809 Proteinuria, unspecified: Secondary | ICD-10-CM | POA: Diagnosis not present

## 2017-07-13 DIAGNOSIS — D631 Anemia in chronic kidney disease: Secondary | ICD-10-CM | POA: Diagnosis not present

## 2017-07-13 DIAGNOSIS — N184 Chronic kidney disease, stage 4 (severe): Secondary | ICD-10-CM | POA: Diagnosis not present

## 2017-07-13 DIAGNOSIS — Z6830 Body mass index (BMI) 30.0-30.9, adult: Secondary | ICD-10-CM | POA: Diagnosis not present

## 2017-07-13 DIAGNOSIS — Z940 Kidney transplant status: Secondary | ICD-10-CM | POA: Diagnosis not present

## 2017-07-13 DIAGNOSIS — I129 Hypertensive chronic kidney disease with stage 1 through stage 4 chronic kidney disease, or unspecified chronic kidney disease: Secondary | ICD-10-CM | POA: Diagnosis not present

## 2017-07-13 DIAGNOSIS — E118 Type 2 diabetes mellitus with unspecified complications: Secondary | ICD-10-CM | POA: Diagnosis not present

## 2017-07-13 DIAGNOSIS — R635 Abnormal weight gain: Secondary | ICD-10-CM | POA: Diagnosis not present

## 2017-07-13 DIAGNOSIS — E1122 Type 2 diabetes mellitus with diabetic chronic kidney disease: Secondary | ICD-10-CM | POA: Diagnosis not present

## 2017-07-13 DIAGNOSIS — E782 Mixed hyperlipidemia: Secondary | ICD-10-CM | POA: Diagnosis not present

## 2017-07-13 DIAGNOSIS — N2581 Secondary hyperparathyroidism of renal origin: Secondary | ICD-10-CM | POA: Diagnosis not present

## 2017-07-20 ENCOUNTER — Ambulatory Visit (INDEPENDENT_AMBULATORY_CARE_PROVIDER_SITE_OTHER): Payer: Medicare Other | Admitting: Internal Medicine

## 2017-07-20 ENCOUNTER — Encounter: Payer: Self-pay | Admitting: Internal Medicine

## 2017-07-20 ENCOUNTER — Other Ambulatory Visit: Payer: Self-pay | Admitting: Internal Medicine

## 2017-07-20 VITALS — BP 220/83 | HR 64 | Temp 98.2°F | Ht 63.0 in | Wt 160.1 lb

## 2017-07-20 DIAGNOSIS — Z21 Asymptomatic human immunodeficiency virus [HIV] infection status: Secondary | ICD-10-CM

## 2017-07-20 DIAGNOSIS — Z794 Long term (current) use of insulin: Secondary | ICD-10-CM

## 2017-07-20 DIAGNOSIS — Z940 Kidney transplant status: Secondary | ICD-10-CM | POA: Diagnosis not present

## 2017-07-20 DIAGNOSIS — I10 Essential (primary) hypertension: Secondary | ICD-10-CM

## 2017-07-20 DIAGNOSIS — E119 Type 2 diabetes mellitus without complications: Secondary | ICD-10-CM

## 2017-07-20 DIAGNOSIS — E1129 Type 2 diabetes mellitus with other diabetic kidney complication: Secondary | ICD-10-CM

## 2017-07-20 DIAGNOSIS — Z79899 Other long term (current) drug therapy: Secondary | ICD-10-CM | POA: Diagnosis not present

## 2017-07-20 DIAGNOSIS — J069 Acute upper respiratory infection, unspecified: Secondary | ICD-10-CM | POA: Insufficient documentation

## 2017-07-20 DIAGNOSIS — Z87891 Personal history of nicotine dependence: Secondary | ICD-10-CM | POA: Diagnosis not present

## 2017-07-20 LAB — GLUCOSE, CAPILLARY: GLUCOSE-CAPILLARY: 89 mg/dL (ref 65–99)

## 2017-07-20 LAB — POCT GLYCOSYLATED HEMOGLOBIN (HGB A1C): HEMOGLOBIN A1C: 6.7

## 2017-07-20 MED ORDER — BENZONATATE 100 MG PO CAPS: 100.0000 mg | ORAL_CAPSULE | Freq: Three times a day (TID) | ORAL | 1 refills | Status: DC | PRN

## 2017-07-20 MED ORDER — LORATADINE 10 MG PO TABS: 10.0000 mg | ORAL_TABLET | Freq: Every day | ORAL | 2 refills | Status: DC

## 2017-07-20 MED ORDER — FLUTICASONE PROPIONATE 50 MCG/ACT NA SUSP: 1.0000 | Freq: Every day | NASAL | 0 refills | Status: DC

## 2017-07-20 MED ORDER — RANITIDINE HCL 150 MG PO TABS: 150.0000 mg | ORAL_TABLET | Freq: Two times a day (BID) | ORAL | 1 refills | Status: DC

## 2017-07-20 MED ORDER — AMLODIPINE BESYLATE 10 MG PO TABS: 10.0000 mg | ORAL_TABLET | Freq: Every day | ORAL | 11 refills | Status: DC

## 2017-07-20 NOTE — Assessment & Plan Note (Signed)
Her A1c today was 6.7, improved from her previous check of 7.4. According to patient she is using NovoLin 70/30 -21 units just in the morning for many months. She is not taking it according to the prescription with says 18 units in the morning and 22 units at night before dinner. She did bring her glucometer meter and it shows average blood glucose of 134 with a range of 96-162.  Her diabetes is under good control. -I advised her to continue doing 21 units just in the morning as her diabetes control is doing well.

## 2017-07-20 NOTE — Assessment & Plan Note (Signed)
BP Readings from Last 3 Encounters:  07/20/17 (!) 220/83  03/19/17 (!) 152/70  01/29/17 (!) 187/73   Her blood pressure was elevated today. Ellyn Hack to patient her nephrologist Dr.Daterding has discontinued her amlodipine and Norvasc, she is just on Lasix 80 mg daily and Coreg 6.25 mg twice daily currently. She denies any headache, blurry vision, chest pain, dyspnea or any focal weakness.  I called [place] [facility] to inquire about medication change, talked with their nurse as there was no physician available at this time, they were unable to find any reason in her chart.  -I restarted her on amlodipine 10 mg daily. -We will discontinue losartan because of her worsening creatinine. -Follow-up in one week for blood pressure check.

## 2017-07-20 NOTE — Patient Instructions (Addendum)
Thank you for visiting clinic today. I am giving you some medicine for your cough and runny nose, and take it as directed. Your diabetes is well controlled-keep up the good work. I am starting you on amlodipine 10 mg daily-please take 1 pill today as soon as you reach home. Please follow-up in one week for your blood pressure check.

## 2017-07-20 NOTE — Progress Notes (Signed)
   CC: For follow-up of her diabetes and hypertension.  HPI:  [name] is a 71 y.o. lady with past medical history as listed below came to the clinic for follow-up of her diabetes and hypertension.  She was complaining of cough with clear mucus, mild sore throat and runny nose for 2 weeks. According to patient she did had some nausea and vomiting 1 week ago which has been dissolved. Now she is experiencing more indigestion and increased belching. She denies any fever or chills. Her appetite is normal. She denies any chest pain, palpitations, dyspnea, orthopnea or PND.   Please see assessment and plan for her chronic problems.  Past Medical History:  Diagnosis Date  . Chronic kidney disease    ESRD secondary to DM, started HD in 2008, Dr. Jimmy [name] is her nephrologist, received renal transplant in 2013, no longer on dialysis  . Diabetes mellitus    Las HbA1C   . Dialysis patient [facility])    T Th Sat  . Hepatitis C    untreated. VL 3700000 in 2008  . History of hepatitis C 08/04/2015  . HIV infection ([place])    Dx in 1998 in [place], she presented with PCP pneumonia at that time. Has been tried on multiple regimens by her PCP in [place] before./ She has been on current ART for years now. Moved to [facility] in 2008 and is following with Dr. Tommy [name] since then.   . Hypertension    Review of Systems:  As HPI  Physical Exam:  Vitals:   07/20/17 1532  BP: (!) 220/83  Pulse: 64  Temp: 98.2 F (36.8 C)  TempSrc: Oral  SpO2: 100%  Weight: 160 lb 1.6 oz (72.6 kg)  Height: 5\' 3"  (1.6 m)    General: Vital signs reviewed.  Patient is well-developed and well-nourished, in no acute distress and cooperative with exam.  Head: Normocephalic and atraumatic. Mildly edematous nasal turbinates bilaterally. No pharyngeal erythema or exudate. Eyes: EOMI, conjunctivae normal, no scleral icterus.  Neck: Supple, trachea midline, normal ROM, no JVD, masses, thyromegaly, or carotid bruit present.  Cardiovascular:  RRR, S1 normal, S2 normal, no murmurs, gallops, or rubs. Pulmonary/Chest: Clear to auscultation bilaterally, no wheezes, rales, or rhonchi. Abdominal: Soft, non-tender, non-distended, BS +, no masses, organomegaly, or guarding present.  Extremities: No lower extremity edema bilaterally,  pulses symmetric and intact bilaterally. No cyanosis or clubbing. Neurological: A&O x3, Strength is normal and symmetric bilaterally, cranial nerve II-XII are grossly intact, no focal motor deficit, sensory intact to light touch bilaterally.  Skin: Warm, dry and intact. No rashes or erythema. Psychiatric: Normal mood and affect. speech and behavior is normal. Cognition and memory are normal.  Assessment & Plan:   See Encounters Tab for problem based charting.  Patient discussed with Dr. Lynnae [name].

## 2017-07-20 NOTE — Assessment & Plan Note (Signed)
She follow-up with nephrology, having worsening creatinine. She had a recent biopsy done of her transportation kidney which was negative for any sign of rejection.   She was advised to continue follow-up with nephrology.

## 2017-07-20 NOTE — Assessment & Plan Note (Signed)
She was having upper respiratory symptoms most likely because of viral illness.  -She was given some Tessalon Perles, Claritin and Flonase for symptomatic management. -I also gave her some Zantac for GI symptoms, she is not a good candidate for Protonix as it interfere with her HIV meds.

## 2017-07-24 NOTE — Progress Notes (Signed)
Internal Medicine Clinic Attending  Case discussed with Dr. Amin at the time of the visit.  We reviewed the resident's history and exam and pertinent patient test results.  I agree with the assessment, diagnosis, and plan of care documented in the resident's note.    

## 2017-07-27 ENCOUNTER — Encounter: Payer: Medicare Other | Admitting: Internal Medicine

## 2017-07-27 ENCOUNTER — Ambulatory Visit (INDEPENDENT_AMBULATORY_CARE_PROVIDER_SITE_OTHER): Payer: Medicare Other | Admitting: Internal Medicine

## 2017-07-27 ENCOUNTER — Encounter: Payer: Self-pay | Admitting: Internal Medicine

## 2017-07-27 VITALS — BP 145/64 | HR 68 | Temp 98.1°F | Wt 163.2 lb

## 2017-07-27 DIAGNOSIS — I10 Essential (primary) hypertension: Secondary | ICD-10-CM | POA: Diagnosis not present

## 2017-07-27 DIAGNOSIS — R05 Cough: Secondary | ICD-10-CM

## 2017-07-27 DIAGNOSIS — R059 Cough, unspecified: Secondary | ICD-10-CM | POA: Insufficient documentation

## 2017-07-27 DIAGNOSIS — Z79899 Other long term (current) drug therapy: Secondary | ICD-10-CM | POA: Diagnosis not present

## 2017-07-27 DIAGNOSIS — Z87891 Personal history of nicotine dependence: Secondary | ICD-10-CM

## 2017-07-27 DIAGNOSIS — J3489 Other specified disorders of nose and nasal sinuses: Secondary | ICD-10-CM

## 2017-07-27 DIAGNOSIS — R319 Hematuria, unspecified: Secondary | ICD-10-CM | POA: Diagnosis not present

## 2017-07-27 MED ORDER — AZITHROMYCIN 250 MG PO TABS: ORAL_TABLET | ORAL | 0 refills | Status: AC

## 2017-07-27 NOTE — Patient Instructions (Addendum)
Thank you for visiting clinic today. I'm giving you a course of Z-Pak or azithromycin for your cough. Please take it as directed. Your blood pressure remained little high, please check your blood pressure at home and keep the log of it. Please to discuss your blood pressure with your kidney doctor. Follow-up with [place] in 3 month.

## 2017-07-27 NOTE — Assessment & Plan Note (Signed)
BP Readings from Last 3 Encounters:  07/27/17 (!) 145/64  07/20/17 (!) 220/83  03/19/17 (!) 152/70   Her blood pressure was mildly elevated, much improved from her previous reading 1 week ago. She was compliant with her medication.  Continue with current  management with Coreg 6.25 mg twice daily, Lasix and amlodipine 10 mg daily. If her blood pressure remained  elevated we can increase Coreg during next follow-up visit.

## 2017-07-27 NOTE — Progress Notes (Signed)
   CC: Worsening cough with thick yellowish sputum for more than 3 weeks and 4 blood pressure check.  HPI:  [name] is a 71 y.o. lady with past medical history as listed below came to the clinic for 1 week follow-up of her blood pressure, as her amlodipine was restarted during previous office visit 1 week ago.  She was complaining of worsening of her cough and change in the quality and quantity of sputum from clear to thick yellowish. She denies any sinus congestion. She denies any fever or chills. She denies any chest pain or exertional dyspnea.  She took her meds around known, as she was busy with another appointment in the morning.  Past Medical History:  Diagnosis Date  . Chronic kidney disease    ESRD secondary to DM, started HD in 2008, Dr. Jimmy Footman is her nephrologist, received renal transplant in 2013, no longer on dialysis  . Diabetes mellitus    Las HbA1C   . Dialysis patient [facility])    T Th Sat  . Hepatitis C    untreated. VL 3700000 in 2008  . History of hepatitis C 08/04/2015  . HIV infection ([place])    Dx in 1998 in [place], she presented with PCP pneumonia at that time. Has been tried on multiple regimens by her PCP in [place] before./ She has been on current ART for years now. Moved to [facility] in 2008 and is following with Dr. Tommy Medal since then.   . Hypertension    Review of Systems:  As per HPI.  Physical Exam:  Vitals:   07/27/17 1349  BP: (!) 163/69  Pulse: 68  Temp: 98.1 F (36.7 C)  TempSrc: Oral  SpO2: 100%  Weight: 163 lb 3.2 oz (74 kg)    General: Vital signs reviewed.  Patient is well-developed and well-nourished, in no acute distress and cooperative with exam.  HEENT: Normocephalic and atraumatic. Erythema and edema of nasal turbinates. Mild pharyngeal erythema. No sinus tenderness.  Cardiovascular: RRR, S1 normal, S2 normal, no murmurs, gallops, or rubs. Pulmonary/Chest: Clear to auscultation bilaterally, no wheezes, rales, or rhonchi. Abdominal: Soft,  non-tender, non-distended, BS +, no masses, organomegaly, or guarding present.  Extremities: No lower extremity edema bilaterally,  pulses symmetric and intact bilaterally. No cyanosis or clubbing. Skin: Warm, dry and intact. No rashes or erythema. Psychiatric: Normal mood and affect. speech and behavior is normal. Cognition and memory are normal.  Assessment & Plan:   See Encounters Tab for problem based charting.  Patient discussed with Dr. Angelia Mould.

## 2017-07-27 NOTE — Assessment & Plan Note (Signed)
She is having worsening cough for more than 3 weeks now with change in mucus quantity and quality. Although she was afebrile she might be getting some superadded infection.  Prescribed her a course of Z pack.

## 2017-07-29 NOTE — Progress Notes (Signed)
Internal Medicine Clinic Attending  Case discussed with Dr. Amin at the time of the visit.  We reviewed the resident's history and exam and pertinent patient test results.  I agree with the assessment, diagnosis, and plan of care documented in the resident's note.    

## 2017-08-06 ENCOUNTER — Other Ambulatory Visit: Payer: Self-pay | Admitting: Internal Medicine

## 2017-08-06 DIAGNOSIS — E785 Hyperlipidemia, unspecified: Secondary | ICD-10-CM

## 2017-08-07 DIAGNOSIS — I129 Hypertensive chronic kidney disease with stage 1 through stage 4 chronic kidney disease, or unspecified chronic kidney disease: Secondary | ICD-10-CM | POA: Diagnosis not present

## 2017-08-13 ENCOUNTER — Other Ambulatory Visit: Payer: Self-pay | Admitting: Internal Medicine

## 2017-08-13 DIAGNOSIS — J069 Acute upper respiratory infection, unspecified: Secondary | ICD-10-CM

## 2017-08-15 ENCOUNTER — Other Ambulatory Visit: Payer: Self-pay | Admitting: Internal Medicine

## 2017-08-15 DIAGNOSIS — J069 Acute upper respiratory infection, unspecified: Secondary | ICD-10-CM

## 2017-08-28 ENCOUNTER — Encounter: Payer: Medicare Other | Admitting: Podiatry

## 2017-08-28 NOTE — Progress Notes (Signed)
This encounter was created in error - please disregard.

## 2017-09-04 ENCOUNTER — Other Ambulatory Visit: Payer: Self-pay | Admitting: Internal Medicine

## 2017-09-04 DIAGNOSIS — E785 Hyperlipidemia, unspecified: Secondary | ICD-10-CM

## 2017-10-08 ENCOUNTER — Other Ambulatory Visit: Payer: Self-pay | Admitting: Internal Medicine

## 2017-10-08 ENCOUNTER — Other Ambulatory Visit: Payer: Self-pay | Admitting: *Deleted

## 2017-10-08 DIAGNOSIS — J069 Acute upper respiratory infection, unspecified: Secondary | ICD-10-CM

## 2017-10-08 MED ORDER — RILPIVIRINE HCL 25 MG PO TABS: ORAL_TABLET | ORAL | 5 refills | Status: DC

## 2017-10-08 MED ORDER — DOLUTEGRAVIR SODIUM 50 MG PO TABS: 50.0000 mg | ORAL_TABLET | Freq: Every day | ORAL | 5 refills | Status: DC

## 2017-10-08 MED ORDER — LAMIVUDINE 100 MG PO TABS: 100.0000 mg | ORAL_TABLET | Freq: Every day | ORAL | 5 refills | Status: DC

## 2017-10-09 ENCOUNTER — Other Ambulatory Visit: Payer: Self-pay | Admitting: Internal Medicine

## 2017-10-09 DIAGNOSIS — J069 Acute upper respiratory infection, unspecified: Secondary | ICD-10-CM

## 2017-10-11 ENCOUNTER — Other Ambulatory Visit: Payer: Self-pay | Admitting: Internal Medicine

## 2017-10-11 DIAGNOSIS — J069 Acute upper respiratory infection, unspecified: Secondary | ICD-10-CM

## 2017-10-11 MED ORDER — FLUTICASONE PROPIONATE 50 MCG/ACT NA SUSP: NASAL | 0 refills | Status: DC

## 2017-10-11 NOTE — Telephone Encounter (Signed)
I did sent another prescription.   [name]

## 2017-10-16 DIAGNOSIS — I129 Hypertensive chronic kidney disease with stage 1 through stage 4 chronic kidney disease, or unspecified chronic kidney disease: Secondary | ICD-10-CM | POA: Diagnosis not present

## 2017-10-16 DIAGNOSIS — R319 Hematuria, unspecified: Secondary | ICD-10-CM | POA: Diagnosis not present

## 2017-10-16 DIAGNOSIS — E669 Obesity, unspecified: Secondary | ICD-10-CM | POA: Diagnosis not present

## 2017-10-16 DIAGNOSIS — R809 Proteinuria, unspecified: Secondary | ICD-10-CM | POA: Diagnosis not present

## 2017-10-16 DIAGNOSIS — E1122 Type 2 diabetes mellitus with diabetic chronic kidney disease: Secondary | ICD-10-CM | POA: Diagnosis not present

## 2017-10-16 DIAGNOSIS — E782 Mixed hyperlipidemia: Secondary | ICD-10-CM | POA: Diagnosis not present

## 2017-10-16 DIAGNOSIS — Z940 Kidney transplant status: Secondary | ICD-10-CM | POA: Diagnosis not present

## 2017-10-16 DIAGNOSIS — N184 Chronic kidney disease, stage 4 (severe): Secondary | ICD-10-CM | POA: Diagnosis not present

## 2017-10-16 DIAGNOSIS — D631 Anemia in chronic kidney disease: Secondary | ICD-10-CM | POA: Diagnosis not present

## 2017-10-16 DIAGNOSIS — E118 Type 2 diabetes mellitus with unspecified complications: Secondary | ICD-10-CM | POA: Diagnosis not present

## 2017-10-16 DIAGNOSIS — N2581 Secondary hyperparathyroidism of renal origin: Secondary | ICD-10-CM | POA: Diagnosis not present

## 2017-10-19 ENCOUNTER — Other Ambulatory Visit: Payer: Self-pay

## 2017-10-19 ENCOUNTER — Ambulatory Visit (INDEPENDENT_AMBULATORY_CARE_PROVIDER_SITE_OTHER): Payer: Medicare Other | Admitting: Internal Medicine

## 2017-10-19 ENCOUNTER — Encounter: Payer: Self-pay | Admitting: Internal Medicine

## 2017-10-19 VITALS — BP 150/72 | HR 64 | Temp 98.1°F | Ht 63.0 in | Wt 163.9 lb

## 2017-10-19 DIAGNOSIS — Z794 Long term (current) use of insulin: Secondary | ICD-10-CM | POA: Diagnosis not present

## 2017-10-19 DIAGNOSIS — Z87891 Personal history of nicotine dependence: Secondary | ICD-10-CM | POA: Diagnosis not present

## 2017-10-19 DIAGNOSIS — Z780 Asymptomatic menopausal state: Secondary | ICD-10-CM

## 2017-10-19 DIAGNOSIS — J302 Other seasonal allergic rhinitis: Secondary | ICD-10-CM

## 2017-10-19 DIAGNOSIS — E1129 Type 2 diabetes mellitus with other diabetic kidney complication: Secondary | ICD-10-CM | POA: Diagnosis not present

## 2017-10-19 DIAGNOSIS — E785 Hyperlipidemia, unspecified: Secondary | ICD-10-CM

## 2017-10-19 DIAGNOSIS — N289 Disorder of kidney and ureter, unspecified: Secondary | ICD-10-CM

## 2017-10-19 DIAGNOSIS — I10 Essential (primary) hypertension: Secondary | ICD-10-CM | POA: Diagnosis not present

## 2017-10-19 DIAGNOSIS — M549 Dorsalgia, unspecified: Secondary | ICD-10-CM

## 2017-10-19 DIAGNOSIS — Z803 Family history of malignant neoplasm of breast: Secondary | ICD-10-CM

## 2017-10-19 DIAGNOSIS — Z940 Kidney transplant status: Secondary | ICD-10-CM

## 2017-10-19 DIAGNOSIS — Z79899 Other long term (current) drug therapy: Secondary | ICD-10-CM | POA: Diagnosis not present

## 2017-10-19 DIAGNOSIS — R05 Cough: Secondary | ICD-10-CM

## 2017-10-19 DIAGNOSIS — R059 Cough, unspecified: Secondary | ICD-10-CM

## 2017-10-19 DIAGNOSIS — Z13820 Encounter for screening for osteoporosis: Secondary | ICD-10-CM

## 2017-10-19 DIAGNOSIS — E1121 Type 2 diabetes mellitus with diabetic nephropathy: Secondary | ICD-10-CM

## 2017-10-19 DIAGNOSIS — Z23 Encounter for immunization: Secondary | ICD-10-CM | POA: Diagnosis not present

## 2017-10-19 LAB — GLUCOSE, CAPILLARY: GLUCOSE-CAPILLARY: 102 mg/dL — AB (ref 65–99)

## 2017-10-19 LAB — POCT GLYCOSYLATED HEMOGLOBIN (HGB A1C): HEMOGLOBIN A1C: 8.3

## 2017-10-19 MED ORDER — CARVEDILOL 12.5 MG PO TABS: 12.5000 mg | ORAL_TABLET | Freq: Two times a day (BID) | ORAL | 3 refills | Status: DC

## 2017-10-19 MED ORDER — ROSUVASTATIN CALCIUM 20 MG PO TABS: 20.0000 mg | ORAL_TABLET | Freq: Every day | ORAL | 3 refills | Status: DC

## 2017-10-19 MED ORDER — FUROSEMIDE 40 MG PO TABS: 80.0000 mg | ORAL_TABLET | Freq: Every day | ORAL | 2 refills | Status: DC

## 2017-10-19 MED ORDER — INSULIN NPH ISOPHANE & REGULAR (70-30) 100 UNIT/ML ~~LOC~~ SUSP: SUBCUTANEOUS | 12 refills | Status: DC

## 2017-10-19 MED ORDER — DICLOFENAC SODIUM 1 % TD GEL: 2.0000 g | Freq: Four times a day (QID) | TRANSDERMAL | 2 refills | Status: DC

## 2017-10-19 NOTE — Progress Notes (Signed)
   CC: For follow-up of her diabetes and hypertension.  HPI:  [name] is a 71 y.o. with past medical history as listed below came to the clinic for follow-up of her diabetes and hypertension.  She was complaining of cough which was restarted 2 weeks ago, mostly at night, with very scant whitish sputum, denies any postnasal drip or sinus congestion. She stopped taking her Zantac approximately a month ago.  She has no other complaints. Please see assessment and plan for her chronic problems.  She is going out of town for a couple of months to help her daughter who was recently diagnosed with breast cancer and going to have her surgery followed by chemotherapy in early December.  Past Medical History:  Diagnosis Date  . Chronic kidney disease    ESRD secondary to DM, started HD in 2008, Dr. Jimmy Footman is her nephrologist, received renal transplant in 2013, no longer on dialysis  . Diabetes mellitus    Las HbA1C   . Dialysis patient [facility])    T Th Sat  . Hepatitis C    untreated. VL 3700000 in 2008  . History of hepatitis C 08/04/2015  . HIV infection ([place])    Dx in 1998 in [place], she presented with PCP pneumonia at that time. Has been tried on multiple regimens by her PCP in [place] before./ She has been on current ART for years now. Moved to [facility] in 2008 and is following with Dr. Tommy Medal since then.   . Hypertension    Review of Systems:  As per HPI  Physical Exam:  Vitals:   10/19/17 1323 10/19/17 1404  BP: (!) 159/64 (!) 150/72  Pulse: 70 64  Temp: 98.1 F (36.7 C)   TempSrc: Oral   SpO2: 100%   Weight: 163 lb 14.4 oz (74.3 kg)   Height: 5\' 3"  (1.6 m)     General: Vital signs reviewed.  Patient is well-developed and well-nourished, in no acute distress and cooperative with exam.  Head: Normocephalic and atraumatic. Eyes: EOMI, conjunctivae normal, no scleral icterus.  Nose and Throat. Mild hyperemia of bilateral nasal turbinate, no pharyngeal erythema or exudate. Neck:  Supple, trachea midline, normal ROM, no JVD, masses, thyromegaly, or carotid bruit present.  Cardiovascular: RRR, S1 normal, S2 normal, no murmurs, gallops, or rubs. Pulmonary/Chest: Clear to auscultation bilaterally, no wheezes, rales, or rhonchi. Abdominal: Soft, non-tender, non-distended, BS +, no masses, organomegaly, or guarding present.  Extremities: No lower extremity edema bilaterally,  pulses symmetric and intact bilaterally. No cyanosis or clubbing. Skin: Warm, dry and intact. No rashes or erythema. Psychiatric: Normal mood and affect. speech and behavior is normal. Cognition and memory are normal.  Assessment & Plan:   See Encounters Tab for problem based charting.  Patient discussed with Dr. Angelia Mould.

## 2017-10-19 NOTE — Patient Instructions (Signed)
Thank you for visiting clinic today. As we discussed please use your insulin 22 units in the morning and 20 units before dinner. As her blood pressure remained elevated I'm increasing your cardiac to 12.5 mg twice daily. Please follow-up in 3 month wants to come back from your trip.

## 2017-10-19 NOTE — Assessment & Plan Note (Signed)
She denies any sinus congestion or nasal discharge. On exam she was having mildly erythematous nasal turbinates.  Continue Claritin and Flonase.

## 2017-10-19 NOTE — Assessment & Plan Note (Signed)
She was complaining of reoccurrence of her cough, her symptoms are more consistent with cough related to GERD. She has stopped taking her Zantac approximately a month ago.  I advised her to restart Zantac 150 milligrams twice daily.

## 2017-10-19 NOTE — Assessment & Plan Note (Signed)
She had worsening renal function with current GFR of 15. She follow-up with [place] [facility] who has started talking with her regarding getting a new access.  -She was counseled against having a good control of her blood pressure and diabetes as that will help maintaining her renal function.

## 2017-10-19 NOTE — Assessment & Plan Note (Signed)
Her A1c today was 8.3, which was increased from her previous check in August, it was 6.7 at that time. According to patient she is eating a lot of cookies and not following diet recently. She did bring her log which shows an average blood glucose of 175 with high of 208 and low of 123. She was using Novolin 70/30, 21 units in the morning and 15 units at dinner.  She was extensively counseled regarding keeping her blood sugars under good control, especially with her transplanted kidney and worsening renal function.  -Increase Novolin 70/30-22 units in the morning and 20 units before dinner. -Follow-up in 3 month.

## 2017-10-19 NOTE — Assessment & Plan Note (Signed)
BP Readings from Last 3 Encounters:  10/19/17 (!) 150/72  07/27/17 (!) 145/64  07/20/17 (!) 220/83   Her blood pressure remained elevated. [facility] increased her Lasix from 40 to 80 mg recently.  -Increase Coreg to 12.5 mg twice daily. -Continue current dose of amlodipine 10 mg daily and Lasix 80 mg daily.

## 2017-10-22 ENCOUNTER — Other Ambulatory Visit: Payer: Medicare Other

## 2017-10-22 ENCOUNTER — Other Ambulatory Visit (HOSPITAL_COMMUNITY)
Admission: RE | Admit: 2017-10-22 | Discharge: 2017-10-22 | Disposition: A | Discharge status: Home / Self Care | Payer: Medicare Other | Source: Ambulatory Visit | Attending: Infectious Disease | Admitting: Infectious Disease

## 2017-10-22 DIAGNOSIS — K219 Gastro-esophageal reflux disease without esophagitis: Secondary | ICD-10-CM | POA: Insufficient documentation

## 2017-10-22 DIAGNOSIS — Z940 Kidney transplant status: Secondary | ICD-10-CM | POA: Insufficient documentation

## 2017-10-22 DIAGNOSIS — Z113 Encounter for screening for infections with a predominantly sexual mode of transmission: Secondary | ICD-10-CM | POA: Insufficient documentation

## 2017-10-22 DIAGNOSIS — Z8619 Personal history of other infectious and parasitic diseases: Secondary | ICD-10-CM | POA: Insufficient documentation

## 2017-10-22 DIAGNOSIS — Z79899 Other long term (current) drug therapy: Secondary | ICD-10-CM | POA: Diagnosis not present

## 2017-10-22 DIAGNOSIS — B20 Human immunodeficiency virus [HIV] disease: Secondary | ICD-10-CM | POA: Diagnosis not present

## 2017-10-23 LAB — COMPLETE METABOLIC PANEL WITH GFR
AG RATIO: 1.2 (calc) (ref 1.0–2.5)
ALKALINE PHOSPHATASE (APISO): 77 U/L (ref 33–130)
ALT: 13 U/L (ref 6–29)
AST: 17 U/L (ref 10–35)
Albumin: 3.5 g/dL — ABNORMAL LOW (ref 3.6–5.1)
BUN / CREAT RATIO: 14 (calc) (ref 6–22)
BUN: 40 mg/dL — ABNORMAL HIGH (ref 7–25)
CO2: 27 mmol/L (ref 20–32)
Calcium: 8.7 mg/dL (ref 8.6–10.4)
Chloride: 102 mmol/L (ref 98–110)
Creat: 2.89 mg/dL — ABNORMAL HIGH (ref 0.50–0.99)
GFR, Est African American: 18 mL/min/{1.73_m2} — ABNORMAL LOW (ref >=60)
GFR, Est Non African American: 16 mL/min/{1.73_m2} — ABNORMAL LOW (ref >=60)
GLOBULIN: 3 g/dL (ref 1.9–3.7)
Glucose, Bld: 221 mg/dL — ABNORMAL HIGH (ref 65–99)
POTASSIUM: 3.3 mmol/L — AB (ref 3.5–5.3)
SODIUM: 141 mmol/L (ref 135–146)
Total Bilirubin: 0.4 mg/dL (ref 0.2–1.2)
Total Protein: 6.5 g/dL (ref 6.1–8.1)

## 2017-10-23 LAB — CBC WITH DIFFERENTIAL/PLATELET
BASOS PCT: 0.3 %
Basophils Absolute: 21 cells/uL (ref 0–200)
EOS ABS: 311 {cells}/uL (ref 15–500)
Eosinophils Relative: 4.5 %
HCT: 35 % (ref 35.0–45.0)
Hemoglobin: 11.3 g/dL — ABNORMAL LOW (ref 11.7–15.5)
Lymphs Abs: 973 cells/uL (ref 850–3900)
MCH: 27.3 pg (ref 27.0–33.0)
MCHC: 32.3 g/dL (ref 32.0–36.0)
MCV: 84.5 fL (ref 80.0–100.0)
MONOS PCT: 10.4 %
MPV: 10.3 fL (ref 7.5–12.5)
NEUTROS PCT: 70.7 %
Neutro Abs: 4878 cells/uL (ref 1500–7800)
Platelets: 180 10*3/uL (ref 140–400)
RBC: 4.14 10*6/uL (ref 3.80–5.10)
RDW: 14.5 % (ref 11.0–15.0)
Total Lymphocyte: 14.1 %
WBC mixed population: 718 cells/uL (ref 200–950)
WBC: 6.9 10*3/uL (ref 3.8–10.8)

## 2017-10-23 LAB — LIPID PANEL
CHOL/HDL RATIO: 2.2 (calc) (ref <5.0)
CHOLESTEROL: 179 mg/dL (ref <200)
HDL: 80 mg/dL (ref >50)
LDL CHOLESTEROL (CALC): 80 mg/dL
Non-HDL Cholesterol (Calc): 99 mg/dL (calc) (ref <130)
TRIGLYCERIDES: 109 mg/dL (ref <150)

## 2017-10-23 LAB — T-HELPER CELL (CD4) - (RCID CLINIC ONLY)
CD4 % Helper T Cell: 17 % — ABNORMAL LOW (ref 33–55)
CD4 T CELL ABS: 180 /uL — AB (ref 400–2700)

## 2017-10-23 LAB — RPR TITER

## 2017-10-23 LAB — URINE CYTOLOGY ANCILLARY ONLY
Chlamydia: NEGATIVE
Neisseria Gonorrhea: NEGATIVE

## 2017-10-23 LAB — FLUORESCENT TREPONEMAL AB(FTA)-IGG-BLD: FLUORESCENT TREPONEMAL ABS: REACTIVE — AB

## 2017-10-23 LAB — RPR: RPR Ser Ql: REACTIVE — AB

## 2017-10-23 NOTE — Progress Notes (Signed)
Internal Medicine Clinic Attending  Case discussed with Dr. Amin at the time of the visit.  We reviewed the resident's history and exam and pertinent patient test results.  I agree with the assessment, diagnosis, and plan of care documented in the resident's note.    

## 2017-10-24 LAB — HIV-1 RNA QUANT-NO REFLEX-BLD
HIV 1 RNA Quant: <20 copies/mL
HIV-1 RNA Quant, Log: <1.3 Log copies/mL

## 2017-10-26 DIAGNOSIS — Z940 Kidney transplant status: Secondary | ICD-10-CM | POA: Diagnosis not present

## 2017-11-05 ENCOUNTER — Ambulatory Visit (INDEPENDENT_AMBULATORY_CARE_PROVIDER_SITE_OTHER): Payer: Medicare Other | Admitting: Infectious Disease

## 2017-11-05 VITALS — BP 159/74 | HR 76 | Temp 98.0°F | Wt 163.0 lb

## 2017-11-05 DIAGNOSIS — E1129 Type 2 diabetes mellitus with other diabetic kidney complication: Secondary | ICD-10-CM | POA: Diagnosis not present

## 2017-11-05 DIAGNOSIS — Z113 Encounter for screening for infections with a predominantly sexual mode of transmission: Secondary | ICD-10-CM

## 2017-11-05 DIAGNOSIS — Z794 Long term (current) use of insulin: Secondary | ICD-10-CM | POA: Diagnosis not present

## 2017-11-05 DIAGNOSIS — I10 Essential (primary) hypertension: Secondary | ICD-10-CM | POA: Diagnosis not present

## 2017-11-05 DIAGNOSIS — Z940 Kidney transplant status: Secondary | ICD-10-CM

## 2017-11-05 DIAGNOSIS — B20 Human immunodeficiency virus [HIV] disease: Secondary | ICD-10-CM

## 2017-11-05 DIAGNOSIS — Z79899 Other long term (current) drug therapy: Secondary | ICD-10-CM | POA: Diagnosis not present

## 2017-11-05 NOTE — Progress Notes (Signed)
Chief complaint: trouble missing doses of her ARVs  Subjective:    Patient ID: [name], female    DOB: 04-May-1946, 71 y.o.   MRN: [id number]  HPI  71 year-old woman with past medical history is significant for HIV infection, hypertension, diabetes mellitus type II, sp renal transplant at [facility]. She has maintained perfect virological suppression though she has had worsening renal fx that has caused use to readjust her ARVs.  We eventually opted to change her to Tivicay, Rilpivirine and lamivudine.   She has done extremely well with her virological suppression but in the past month she admits to missing 50% of her meds due to falling asleep at night.  Surprisingly her VL was s<20 2 weeks ago.  I told her that this level of adherence was recipe for certain virological failure over the long run.  We will recheck labs today.  She does say that she has indigestation with the newest regimen and best I can tell this could only be blamed on RPV because she has been on DTG and 3TC before including as a part of [place].  She also tells me that her nephrologist have told her that she likely will need to go back on HD in 6 months.   Past Medical History:  Diagnosis Date  . Chronic kidney disease    ESRD secondary to DM, started HD in 2008, Dr. Jimmy Footman is her nephrologist, received renal transplant in 2013, no longer on dialysis  . Diabetes mellitus    Las HbA1C   . Dialysis patient [facility])    T Th Sat  . Hepatitis C    untreated. VL 3700000 in 2008  . History of hepatitis C 08/04/2015  . HIV infection ([place])    Dx in 1998 in [place], she presented with PCP pneumonia at that time. Has been tried on multiple regimens by her PCP in [place] before./ She has been on current ART for years now. Moved to [facility] in 2008 and is following with Dr. Tommy Medal since then.   . Hypertension     Past Surgical History:  Procedure Laterality Date  . ABDOMINAL HYSTERECTOMY     and oopherectomy for fibroids  .  CHOLECYSTECTOMY    . KIDNEY TRANSPLANT      Family History  Problem Relation Age of Onset  . Diabetes Mother   . Cancer Father   . Cancer Brother       Social History   Socioeconomic History  . Marital status: Single    Spouse name: Not on file  . Number of children: Not on file  . Years of education: Not on file  . Highest education level: Not on file  Social Needs  . Financial resource strain: Not on file  . Food insecurity - worry: Not on file  . Food insecurity - inability: Not on file  . Transportation needs - medical: Not on file  . Transportation needs - non-medical: Not on file  Occupational History  . Not on file  Tobacco Use  . Smoking status: Former Smoker    Last attempt to quit: 05/22/1997    Years since quitting: 20.4  . Smokeless tobacco: Never Used  Substance and Sexual Activity  . Alcohol use: No    Alcohol/week: 0.0 oz  . Drug use: No  . Sexual activity: Not on file  Other Topics Concern  . Not on file  Social History Narrative  . Not on file    Allergies  Allergen  Reactions  . Omeprazole Other (See Comments)    Interferes with the absorption of rilipivirine  . Influenza Vaccines Other (See Comments)    Other reaction(s): Hallucination  . Influenza Virus Vacc Split Pf     Alleged severe illness in [place]. Do not have documentation from [place] but pt always refuses vaccine on these grounds     Current Outpatient Medications:  .  amLODipine (NORVASC) 10 MG tablet, Take 1 tablet (10 mg total) by mouth daily., Disp: 30 tablet, Rfl: 11 .  aspirin 81 MG EC tablet, Take 81 mg by mouth daily.  , Disp: , Rfl:  .  benzonatate (TESSALON PERLES) 100 MG capsule, Take 1 capsule (100 mg total) by mouth 3 (three) times daily as needed for cough., Disp: 30 capsule, Rfl: 1 .  carvedilol (COREG) 12.5 MG tablet, Take 1 tablet (12.5 mg total) 2 (two) times daily with a meal by mouth., Disp: 60 tablet, Rfl: 3 .  diclofenac sodium (VOLTAREN) 1 % GEL, Apply 2 g 4  (four) times daily topically., Disp: 100 g, Rfl: 2 .  dolutegravir (TIVICAY) 50 MG tablet, Take 1 tablet (50 mg total) daily by mouth., Disp: 30 tablet, Rfl: 5 .  fluticasone (FLONASE) 50 MCG/ACT nasal spray, SHAKE LIQUID AND USE 1 SPRAY IN EACH NOSTRIL DAILY  Dispense 48 ml., Disp: 48 g, Rfl: 0 .  FREESTYLE TEST STRIPS test strip, Use with glucometer, Disp: 100 each, Rfl: 9 .  lamivudine (EPIVIR) 100 MG tablet, Take 1 tablet (100 mg total) daily by mouth., Disp: 30 tablet, Rfl: 5 .  loratadine (CLARITIN) 10 MG tablet, Take 1 tablet (10 mg total) by mouth daily., Disp: 30 tablet, Rfl: 2 .  magnesium oxide (MAG-OX) 400 MG tablet, Take 400 mg by mouth 2 (two) times daily., Disp: , Rfl:  .  mycophenolate (MYFORTIC) 180 MG EC tablet, Take 3 tablets (540 mg total) by mouth 2 (two) times daily., Disp: , Rfl:  .  predniSONE (DELTASONE) 5 MG tablet, Take 5 mg by mouth daily., Disp: , Rfl:  .  ranitidine (ZANTAC) 150 MG tablet, Take 1 tablet (150 mg total) by mouth 2 (two) times daily., Disp: 60 tablet, Rfl: 1 .  rilpivirine (EDURANT) 25 MG TABS tablet, take 1 tablet by mouth once daily WITH BREAKFAST, Disp: 30 tablet, Rfl: 5 .  rosuvastatin (CRESTOR) 20 MG tablet, Take 1 tablet (20 mg total) daily by mouth., Disp: 90 tablet, Rfl: 3 .  senna-docusate (SENOKOT-S) 8.6-50 MG per tablet, Take 2 tablets by mouth 2 (two) times daily as needed for constipation., Disp: 60 tablet, Rfl: 12 .  SENSIPAR 60 MG tablet, TAKE 1 TABLET BY MOUTH EVERY DAY, Disp: 90 tablet, Rfl: 1 .  tacrolimus (PROGRAF) 1 MG capsule, Take 4 mg ( 4 pills) in AM and 3 mg ( 3 pills) in PM--V42.0, Disp: , Rfl:  .  triamcinolone cream (KENALOG) 0.1 %, Apply 1 application topically 2 (two) times daily., Disp: 30 g, Rfl: 0 .  fluconazole (DIFLUCAN) 150 MG tablet, Take 1 tablet (150 mg total) by mouth every other day. Take 1st pill today and 2nd pill on 04/21/2016 (Patient not taking: Reported on 11/05/2017), Disp: 2 tablet, Rfl: 0 .  furosemide  (LASIX) 40 MG tablet, Take 2 tablets (80 mg total) daily by mouth., Disp: 60 tablet, Rfl: 2 .  insulin NPH-regular Human (NOVOLIN 70/30) (70-30) 100 UNIT/ML injection, Inject 22 units into the skin before breakfast and 20 units before dinner, Disp: 10 mL, Rfl: 12 .  Insulin Syringe-Needle U-100 31G X 15/64" 0.3 ML MISC, Sig: Use to Inject insulin twice a day, Disp: 100 each, Rfl: 5 .  PATADAY 0.2 % SOLN, , Disp: , Rfl: 0     Review of Systems  Constitutional: Negative for activity change, appetite change, chills, diaphoresis, fatigue, fever and unexpected weight change.  HENT: Negative for congestion, rhinorrhea, sinus pressure, sneezing, sore throat and trouble swallowing.   Eyes: Negative for photophobia and visual disturbance.  Respiratory: Negative for cough, chest tightness, shortness of breath, wheezing and stridor.   Cardiovascular: Negative for chest pain, palpitations and leg swelling.  Gastrointestinal: Positive for abdominal pain. Negative for abdominal distention, anal bleeding, blood in stool, constipation, diarrhea, nausea and vomiting.  Genitourinary: Negative for difficulty urinating, dysuria and hematuria.  Musculoskeletal: Negative for back pain, gait problem, joint swelling and myalgias.  Skin: Negative for color change, pallor, rash and wound.  Neurological: Negative for dizziness, tremors, weakness and light-headedness.  Hematological: Negative for adenopathy. Does not bruise/bleed easily.  Psychiatric/Behavioral: Positive for sleep disturbance. Negative for agitation, behavioral problems, confusion, decreased concentration and dysphoric mood.   + for indigestion     Objective:   Physical Exam  Constitutional: She is oriented to person, place, and time. She appears well-developed and well-nourished. No distress.  HENT:  Head: Normocephalic and atraumatic.  Mouth/Throat: Oropharynx is clear and moist. No oropharyngeal exudate.  Eyes: Conjunctivae and EOM are normal.  No scleral icterus.  Neck: Normal range of motion. Neck supple. No JVD present.  Cardiovascular: Normal rate, regular rhythm and normal heart sounds.  Pulmonary/Chest: Effort normal. No respiratory distress. She has no wheezes.  Abdominal: She exhibits no distension.  Musculoskeletal: She exhibits no edema or tenderness.  Lymphadenopathy:    She has no cervical adenopathy.  Neurological: She is alert and oriented to person, place, and time. Coordination normal.  Skin: Skin is warm and dry. She is not diaphoretic. No erythema. No pallor.  Psychiatric: She has a normal mood and affect. Her behavior is normal. Judgment and thought content normal.          Assessment & Plan:   HIV: her aderence has fallen of a cliff and worsened terribly. Fortunately she has not YET failed with resistance. We will check labs today with reflex genotype. She will tighten up her adherence she promises by taking ALL of her meds together IN THE MORNING WITH FOOD  I am strongly considering change to BIKTARVY to keep her on a STR to avoid hazards of her taking partial regimen and to not worry about PPIs etc and to get RPV out if it is REALLY causing her ssx of indigestion. The TAF should be completely safe as newest TNF formulation but I will wait on what transpires with her kidney function so as not to cause anxiety with her nephrologists or transplant MDs  Renal transplantation: being followed by Nephrology here.Creatine has been rising, worsening and she will likely go back on HD but desires and has been told a new transplant is possible if other diseases such as DM can be better controlled  DM: being followed by Endocrine, and PCP    HTN: BP again not at goal, and likely one of the drivers of her worsening renal fxn Vitals:   11/05/17 1026  BP: (!) 159/74  Pulse: 76  Temp: 98 F (36.7 C)     Vitals:   11/05/17 1026  BP: (!) 159/74  Pulse: 76  Temp: 98 F (36.7 C)  I spent greater than 25  minutes with the patient including greater than 50% of time in face to face counsel of the patient and in coordination of her care reviewing the consequences of virological failure with resistance which would include need for much more complicated regimens with greater side effects toxicity and DDI esp with her transplant medications.    Lavell Islam. Tommy Medal, [place] [facility]

## 2017-11-06 LAB — T-HELPER CELL (CD4) - (RCID CLINIC ONLY)
CD4 T CELL HELPER: 16 % — AB (ref 33–55)
CD4 T Cell Abs: 160 /uL — ABNORMAL LOW (ref 400–2700)

## 2017-11-10 LAB — HIV RNA, RTPCR W/R GT (RTI, PI,INT)
HIV 1 RNA Quant: <20 copies/mL
HIV-1 RNA Quant, Log: <1.3 Log copies/mL

## 2017-11-30 ENCOUNTER — Other Ambulatory Visit: Payer: Self-pay | Admitting: Internal Medicine

## 2017-11-30 DIAGNOSIS — E2839 Other primary ovarian failure: Secondary | ICD-10-CM

## 2017-12-05 ENCOUNTER — Encounter: Payer: Medicare Other | Admitting: Vascular Surgery

## 2017-12-05 ENCOUNTER — Other Ambulatory Visit (HOSPITAL_COMMUNITY): Payer: Medicare Other

## 2017-12-05 ENCOUNTER — Encounter (HOSPITAL_COMMUNITY): Payer: Medicare Other

## 2018-01-01 ENCOUNTER — Other Ambulatory Visit: Payer: Self-pay

## 2018-01-01 DIAGNOSIS — N184 Chronic kidney disease, stage 4 (severe): Secondary | ICD-10-CM

## 2018-01-07 ENCOUNTER — Other Ambulatory Visit: Payer: Self-pay | Admitting: Internal Medicine

## 2018-01-07 DIAGNOSIS — J069 Acute upper respiratory infection, unspecified: Secondary | ICD-10-CM

## 2018-01-14 ENCOUNTER — Other Ambulatory Visit: Payer: Self-pay | Admitting: Internal Medicine

## 2018-01-14 DIAGNOSIS — I10 Essential (primary) hypertension: Secondary | ICD-10-CM

## 2018-01-14 NOTE — Telephone Encounter (Signed)
Next appt scheduled  2/15 with PCP.

## 2018-01-14 NOTE — Telephone Encounter (Signed)
Lasix is being managed by [facility]. Her potassium checked in November was little low, I do not have access to any recent BMP done at [facility]. Please advised patient to contact them regarding her Lasix needs.

## 2018-01-16 ENCOUNTER — Other Ambulatory Visit: Payer: Self-pay

## 2018-01-16 ENCOUNTER — Encounter: Payer: Self-pay | Admitting: *Deleted

## 2018-01-16 ENCOUNTER — Other Ambulatory Visit: Payer: Self-pay | Admitting: *Deleted

## 2018-01-16 ENCOUNTER — Encounter: Payer: Self-pay | Admitting: Vascular Surgery

## 2018-01-16 ENCOUNTER — Encounter: Payer: Self-pay | Admitting: Pharmacist

## 2018-01-16 ENCOUNTER — Ambulatory Visit (INDEPENDENT_AMBULATORY_CARE_PROVIDER_SITE_OTHER): Payer: Medicare Other | Admitting: Vascular Surgery

## 2018-01-16 ENCOUNTER — Ambulatory Visit (INDEPENDENT_AMBULATORY_CARE_PROVIDER_SITE_OTHER)
Admission: RE | Admit: 2018-01-16 | Discharge: 2018-01-16 | Disposition: A | Discharge status: Home / Self Care | Payer: Medicare Other | Source: Ambulatory Visit | Attending: Vascular Surgery | Admitting: Vascular Surgery

## 2018-01-16 ENCOUNTER — Ambulatory Visit (HOSPITAL_COMMUNITY)
Admission: RE | Admit: 2018-01-16 | Discharge: 2018-01-16 | Disposition: A | Discharge status: Home / Self Care | Payer: Medicare Other | Source: Ambulatory Visit | Attending: Vascular Surgery | Admitting: Vascular Surgery

## 2018-01-16 VITALS — BP 145/80 | HR 62 | Temp 97.4°F | Resp 16 | Ht 63.0 in | Wt 160.0 lb

## 2018-01-16 DIAGNOSIS — N184 Chronic kidney disease, stage 4 (severe): Secondary | ICD-10-CM

## 2018-01-16 MED ORDER — INSULIN DEGLUDEC-LIRAGLUTIDE 100-3.6 UNIT-MG/ML ~~LOC~~ SOPN: 20.0000 [IU] | PEN_INJECTOR | Freq: Every day | SUBCUTANEOUS | 0 refills | Status: DC

## 2018-01-16 MED ORDER — AMLODIPINE-ATORVASTATIN 10-40 MG PO TABS
1.0000 | ORAL_TABLET | Freq: Every day | ORAL | 3 refills | Status: DC
Start: 2018-01-16 — End: 2018-01-16

## 2018-01-16 NOTE — H&P (View-Only) (Signed)
Patient name: [name] MRN: [id number] DOB: [birth year] Sex: female   REASON FOR CONSULT:    To evaluate for hemodialysis access.  The consult is requested by Dr. Jimmy Footman  HPI:   [name] is a pleasant 72 y.o. female, with a history of stage V chronic kidney disease.  She is referred for evaluation for hemodialysis access.  I have reviewed the records that were sent from the referring office.  The patient has progressive chronic kidney disease.  This is related to diabetes.  Patient also has a history of HIV and secondary hyperparathyroidism.  Patient has undergone previous kidney transplant.  According to the referring note, if we were not able to place an AV fistula then we are asked to wait before placing an AV graft.  The patient denies any recent uremic symptoms.  Specifically, she denies nausea, vomiting, fatigue, anorexia, or palpitations.  She is not on dialysis.  Past Medical History:  Diagnosis Date  . Chronic kidney disease    ESRD secondary to DM, started HD in 2008, Dr. Jimmy Footman is her nephrologist, received renal transplant in 2013, no longer on dialysis  . Diabetes mellitus    Las HbA1C   . Dialysis patient [facility])    T Th Sat  . Hepatitis C    untreated. VL 3700000 in 2008  . History of hepatitis C 08/04/2015  . HIV infection ([place])    Dx in 1998 in [place], she presented with PCP pneumonia at that time. Has been tried on multiple regimens by her PCP in [place] before./ She has been on current ART for years now. Moved to [facility] in 2008 and is following with Dr. Tommy Medal since then.   . Hypertension     Family History  Problem Relation Age of Onset  . Diabetes Mother   . Cancer Father   . Cancer Brother     SOCIAL HISTORY: Social History   Socioeconomic History  . Marital status: Single    Spouse name: Not on file  . Number of children: Not on file  . Years of education: Not on file  . Highest education level: Not on file  Social Needs  . Financial  resource strain: Not on file  . Food insecurity - worry: Not on file  . Food insecurity - inability: Not on file  . Transportation needs - medical: Not on file  . Transportation needs - non-medical: Not on file  Occupational History  . Not on file  Tobacco Use  . Smoking status: Former Smoker    Last attempt to quit: 05/22/1997    Years since quitting: 20.6  . Smokeless tobacco: Never Used  Substance and Sexual Activity  . Alcohol use: No    Alcohol/week: 0.0 oz  . Drug use: No  . Sexual activity: Not on file  Other Topics Concern  . Not on file  Social History Narrative  . Not on file    Allergies  Allergen Reactions  . Omeprazole Other (See Comments)    Interferes with the absorption of rilipivirine  . Influenza Vaccines Other (See Comments)    Other reaction(s): Hallucination  . Influenza Virus Vacc Split Pf     Alleged severe illness in [place]. Do not have documentation from [place] but pt always refuses vaccine on these grounds    Current Outpatient Medications  Medication Sig Dispense Refill  . amLODipine (NORVASC) 10 MG tablet Take 1 tablet (10 mg total) by mouth daily. 30 tablet  11  . aspirin 81 MG EC tablet Take 81 mg by mouth daily.      . carvedilol (COREG) 12.5 MG tablet Take 1 tablet (12.5 mg total) 2 (two) times daily with a meal by mouth. 60 tablet 3  . diclofenac sodium (VOLTAREN) 1 % GEL Apply 2 g 4 (four) times daily topically. 100 g 2  . dolutegravir (TIVICAY) 50 MG tablet Take 1 tablet (50 mg total) daily by mouth. 30 tablet 5  . fluticasone (FLONASE) 50 MCG/ACT nasal spray SHAKE LIQUID WELL AND USE 1 SPRAY IN EACH NOSTRIL DAILY 16 g 0  . FREESTYLE TEST STRIPS test strip Use with glucometer 100 each 9  . furosemide (LASIX) 40 MG tablet Take 2 tablets (80 mg total) daily by mouth. 60 tablet 2  . insulin NPH-regular Human (NOVOLIN 70/30) (70-30) 100 UNIT/ML injection Inject 22 units into the skin before breakfast and 20 units before dinner 10 mL 12  . Insulin  Syringe-Needle U-100 31G X 15/64" 0.3 ML MISC Sig: Use to Inject insulin twice a day 100 each 5  . lamivudine (EPIVIR) 100 MG tablet Take 1 tablet (100 mg total) daily by mouth. 30 tablet 5  . loratadine (CLARITIN) 10 MG tablet Take 1 tablet (10 mg total) by mouth daily. 30 tablet 2  . ranitidine (ZANTAC) 150 MG tablet Take 1 tablet (150 mg total) by mouth 2 (two) times daily. 60 tablet 1  . rilpivirine (EDURANT) 25 MG TABS tablet take 1 tablet by mouth once daily WITH BREAKFAST 30 tablet 5  . rosuvastatin (CRESTOR) 20 MG tablet Take 1 tablet (20 mg total) daily by mouth. 90 tablet 3  . tacrolimus (PROGRAF) 1 MG capsule Take 4 mg ( 4 pills) in AM and 3 mg ( 3 pills) in PM--V42.0     No current facility-administered medications for this visit.     REVIEW OF SYSTEMS:  [X]  denotes positive finding, [ ]  denotes negative finding Cardiac  Comments:  Chest pain or chest pressure:    Shortness of breath upon exertion:    Short of breath when lying flat:    Irregular heart rhythm:        Vascular    Pain in calf, thigh, or hip brought on by ambulation:    Pain in feet at night that wakes you up from your sleep:     Blood clot in your veins:    Leg swelling:         Pulmonary    Oxygen at home:    Productive cough:     Wheezing:         Neurologic    Sudden weakness in arms or legs:     Sudden numbness in arms or legs:     Sudden onset of difficulty speaking or slurred speech:    Temporary loss of vision in one eye:     Problems with dizziness:         Gastrointestinal    Blood in stool:     Vomited blood:         Genitourinary    Burning when urinating:     Blood in urine:        Psychiatric    Major depression:         Hematologic    Bleeding problems:    Problems with blood clotting too easily:        Skin    Rashes or ulcers:        Constitutional  Fever or chills:     PHYSICAL EXAM:   Vitals:   01/16/18 1104 01/16/18 1110  BP: (!) 156/82 (!) 145/80  Pulse:  64 62  Resp: 16   Temp: (!) 97.4 F (36.3 C)   TempSrc: Oral   SpO2: 100%   Weight: 160 lb (72.6 kg)   Height: 5\' 3"  (1.6 m)     GENERAL: The patient is a well-nourished female, in no acute distress. The vital signs are documented above. CARDIAC: There is a regular rate and rhythm.  VASCULAR: I do not detect carotid bruits. She has palpable brachial and radial pulses bilaterally. PULMONARY: There is good air exchange bilaterally without wheezing or rales. ABDOMEN: Soft and non-tender with normal pitched bowel sounds.  MUSCULOSKELETAL: There are no major deformities or cyanosis. NEUROLOGIC: No focal weakness or paresthesias are detected. SKIN: There are no ulcers or rashes noted. PSYCHIATRIC: The patient has a normal affect.  DATA:    LABS: I reviewed the labs that were done in November 2018.  GFR was 16.  BILATERAL UPPER EXTREMITY VEIN MAP: I have independently interpreted her upper extremity vein map.  On the right side the forearm and upper arm cephalic vein are not adequate.  The basilic vein is marginal in size.  On the left side, the forearm and upper arm cephalic vein did not appear to be adequate.  The basilic vein is small at the antecubital level but might potentially be usable in the upper arm.  I did review the images and the vein appears quite small despite measurements that are not unrealistic.  UPPER EXTREMITY ARTERIAL DUPLEX: I have independently interpreted the upper extremity arterial duplex scan.  On the right side there are triphasic Doppler signals in the radial and ulnar positions.  The brachial artery is 0.47 cm in diameter.  On the left side there are triphasic radial and ulnar waveforms.  The brachial artery is 0.45 cm in diameter.  MEDICAL ISSUES:   STAGE V CHRONIC KIDNEY DISEASE: We have been asked to only place an AV fistula and weight before placing an AV graft if this is not possible.  The only potential vein that I see that might be adequate for a  fistula is the left basilic vein.  Given that this is marginal in size it would probably be best to do this in 2 stages.  I have explained that if the vein does appear to be adequate then we could potentially do this in 1 stage.  However more likely will do a first stage basilic vein transposition.  I have discussed the procedure and potential complications with the patient and she is agreeable to proceed.  This has been scheduled for 01/29/2018.  The patient is HIV positive.  Deitra Mayo [facility] [phone]

## 2018-01-16 NOTE — Progress Notes (Addendum)
Patient name: [name] MRN: [id number] DOB: March 18, 1946 Sex: female   REASON FOR CONSULT:    To evaluate for hemodialysis access.  The consult is requested by Dr. Jimmy Footman  HPI:   ALISIA [name] is a pleasant 72 y.o. female, with a history of stage V chronic kidney disease.  She is referred for evaluation for hemodialysis access.  I have reviewed the records that were sent from the referring office.  The patient has progressive chronic kidney disease.  This is related to diabetes.  Patient also has a history of HIV and secondary hyperparathyroidism.  Patient has undergone previous kidney transplant.  According to the referring note, if we were not able to place an AV fistula then we are asked to wait before placing an AV graft.  The patient denies any recent uremic symptoms.  Specifically, she denies nausea, vomiting, fatigue, anorexia, or palpitations.  She is not on dialysis.  Past Medical History:  Diagnosis Date  . Chronic kidney disease    ESRD secondary to DM, started HD in 2008, Dr. Jimmy Footman is her nephrologist, received renal transplant in 2013, no longer on dialysis  . Diabetes mellitus    Las HbA1C   . Dialysis patient [facility])    T Th Sat  . Hepatitis C    untreated. VL 3700000 in 2008  . History of hepatitis C 08/04/2015  . HIV infection ([place])    Dx in 1998 in [place], she presented with PCP pneumonia at that time. Has been tried on multiple regimens by her PCP in [place] before./ She has been on current ART for years now. Moved to [facility] in 2008 and is following with Dr. Tommy Medal since then.   . Hypertension     Family History  Problem Relation Age of Onset  . Diabetes Mother   . Cancer Father   . Cancer Brother     SOCIAL HISTORY: Social History   Socioeconomic History  . Marital status: Single    Spouse name: Not on file  . Number of children: Not on file  . Years of education: Not on file  . Highest education level: Not on file  Social Needs  . Financial  resource strain: Not on file  . Food insecurity - worry: Not on file  . Food insecurity - inability: Not on file  . Transportation needs - medical: Not on file  . Transportation needs - non-medical: Not on file  Occupational History  . Not on file  Tobacco Use  . Smoking status: Former Smoker    Last attempt to quit: 05/22/1997    Years since quitting: 20.6  . Smokeless tobacco: Never Used  Substance and Sexual Activity  . Alcohol use: No    Alcohol/week: 0.0 oz  . Drug use: No  . Sexual activity: Not on file  Other Topics Concern  . Not on file  Social History Narrative  . Not on file    Allergies  Allergen Reactions  . Omeprazole Other (See Comments)    Interferes with the absorption of rilipivirine  . Influenza Vaccines Other (See Comments)    Other reaction(s): Hallucination  . Influenza Virus Vacc Split Pf     Alleged severe illness in [place]. Do not have documentation from [place] but pt always refuses vaccine on these grounds    Current Outpatient Medications  Medication Sig Dispense Refill  . amLODipine (NORVASC) 10 MG tablet Take 1 tablet (10 mg total) by mouth daily. 30 tablet  11  . aspirin 81 MG EC tablet Take 81 mg by mouth daily.      . carvedilol (COREG) 12.5 MG tablet Take 1 tablet (12.5 mg total) 2 (two) times daily with a meal by mouth. 60 tablet 3  . diclofenac sodium (VOLTAREN) 1 % GEL Apply 2 g 4 (four) times daily topically. 100 g 2  . dolutegravir (TIVICAY) 50 MG tablet Take 1 tablet (50 mg total) daily by mouth. 30 tablet 5  . fluticasone (FLONASE) 50 MCG/ACT nasal spray SHAKE LIQUID WELL AND USE 1 SPRAY IN EACH NOSTRIL DAILY 16 g 0  . FREESTYLE TEST STRIPS test strip Use with glucometer 100 each 9  . furosemide (LASIX) 40 MG tablet Take 2 tablets (80 mg total) daily by mouth. 60 tablet 2  . insulin NPH-regular Human (NOVOLIN 70/30) (70-30) 100 UNIT/ML injection Inject 22 units into the skin before breakfast and 20 units before dinner 10 mL 12  . Insulin  Syringe-Needle U-100 31G X 15/64" 0.3 ML MISC Sig: Use to Inject insulin twice a day 100 each 5  . lamivudine (EPIVIR) 100 MG tablet Take 1 tablet (100 mg total) daily by mouth. 30 tablet 5  . loratadine (CLARITIN) 10 MG tablet Take 1 tablet (10 mg total) by mouth daily. 30 tablet 2  . ranitidine (ZANTAC) 150 MG tablet Take 1 tablet (150 mg total) by mouth 2 (two) times daily. 60 tablet 1  . rilpivirine (EDURANT) 25 MG TABS tablet take 1 tablet by mouth once daily WITH BREAKFAST 30 tablet 5  . rosuvastatin (CRESTOR) 20 MG tablet Take 1 tablet (20 mg total) daily by mouth. 90 tablet 3  . tacrolimus (PROGRAF) 1 MG capsule Take 4 mg ( 4 pills) in AM and 3 mg ( 3 pills) in PM--V42.0     No current facility-administered medications for this visit.     REVIEW OF SYSTEMS:  [X]  denotes positive finding, [ ]  denotes negative finding Cardiac  Comments:  Chest pain or chest pressure:    Shortness of breath upon exertion:    Short of breath when lying flat:    Irregular heart rhythm:        Vascular    Pain in calf, thigh, or hip brought on by ambulation:    Pain in feet at night that wakes you up from your sleep:     Blood clot in your veins:    Leg swelling:         Pulmonary    Oxygen at home:    Productive cough:     Wheezing:         Neurologic    Sudden weakness in arms or legs:     Sudden numbness in arms or legs:     Sudden onset of difficulty speaking or slurred speech:    Temporary loss of vision in one eye:     Problems with dizziness:         Gastrointestinal    Blood in stool:     Vomited blood:         Genitourinary    Burning when urinating:     Blood in urine:        Psychiatric    Major depression:         Hematologic    Bleeding problems:    Problems with blood clotting too easily:        Skin    Rashes or ulcers:        Constitutional  Fever or chills:     PHYSICAL EXAM:   Vitals:   01/16/18 1104 01/16/18 1110  BP: (!) 156/82 (!) 145/80  Pulse:  64 62  Resp: 16   Temp: (!) 97.4 F (36.3 C)   TempSrc: Oral   SpO2: 100%   Weight: 160 lb (72.6 kg)   Height: 5\' 3"  (1.6 m)     GENERAL: The patient is a well-nourished female, in no acute distress. The vital signs are documented above. CARDIAC: There is a regular rate and rhythm.  VASCULAR: I do not detect carotid bruits. She has palpable brachial and radial pulses bilaterally. PULMONARY: There is good air exchange bilaterally without wheezing or rales. ABDOMEN: Soft and non-tender with normal pitched bowel sounds.  MUSCULOSKELETAL: There are no major deformities or cyanosis. NEUROLOGIC: No focal weakness or paresthesias are detected. SKIN: There are no ulcers or rashes noted. PSYCHIATRIC: The patient has a normal affect.  DATA:    LABS: I reviewed the labs that were done in November 2018.  GFR was 16.  BILATERAL UPPER EXTREMITY VEIN MAP: I have independently interpreted her upper extremity vein map.  On the right side the forearm and upper arm cephalic vein are not adequate.  The basilic vein is marginal in size.  On the left side, the forearm and upper arm cephalic vein did not appear to be adequate.  The basilic vein is small at the antecubital level but might potentially be usable in the upper arm.  I did review the images and the vein appears quite small despite measurements that are not unrealistic.  UPPER EXTREMITY ARTERIAL DUPLEX: I have independently interpreted the upper extremity arterial duplex scan.  On the right side there are triphasic Doppler signals in the radial and ulnar positions.  The brachial artery is 0.47 cm in diameter.  On the left side there are triphasic radial and ulnar waveforms.  The brachial artery is 0.45 cm in diameter.  MEDICAL ISSUES:   STAGE V CHRONIC KIDNEY DISEASE: We have been asked to only place an AV fistula and weight before placing an AV graft if this is not possible.  The only potential vein that I see that might be adequate for a  fistula is the left basilic vein.  Given that this is marginal in size it would probably be best to do this in 2 stages.  I have explained that if the vein does appear to be adequate then we could potentially do this in 1 stage.  However more likely will do a first stage basilic vein transposition.  I have discussed the procedure and potential complications with the patient and she is agreeable to proceed.  This has been scheduled for 01/29/2018.  The patient is HIV positive.  Deitra Mayo [facility] [phone]

## 2018-01-16 NOTE — Progress Notes (Signed)
Vitals:   01/16/18 1104  BP: (!) 156/82  Pulse: 64  Resp: 16  Temp: (!) 97.4 F (36.3 C)  TempSrc: Oral  SpO2: 100%  Weight: 160 lb (72.6 kg)  Height: 5\' 3"  (1.6 m)

## 2018-01-16 NOTE — Addendum Note (Signed)
Addended by: Forde Dandy on: 01/16/2018 11:14 AM   Modules accepted: Orders

## 2018-01-17 ENCOUNTER — Encounter: Payer: Self-pay | Admitting: Nephrology

## 2018-01-18 ENCOUNTER — Ambulatory Visit (INDEPENDENT_AMBULATORY_CARE_PROVIDER_SITE_OTHER): Payer: Medicare Other | Admitting: Internal Medicine

## 2018-01-18 ENCOUNTER — Other Ambulatory Visit: Payer: Self-pay

## 2018-01-18 ENCOUNTER — Encounter: Payer: Self-pay | Admitting: Internal Medicine

## 2018-01-18 VITALS — BP 165/65 | HR 74 | Temp 98.0°F | Ht 63.0 in | Wt 161.8 lb

## 2018-01-18 DIAGNOSIS — Z0000 Encounter for general adult medical examination without abnormal findings: Secondary | ICD-10-CM

## 2018-01-18 DIAGNOSIS — Z1239 Encounter for other screening for malignant neoplasm of breast: Secondary | ICD-10-CM

## 2018-01-18 DIAGNOSIS — Z794 Long term (current) use of insulin: Secondary | ICD-10-CM | POA: Diagnosis not present

## 2018-01-18 DIAGNOSIS — Z87891 Personal history of nicotine dependence: Secondary | ICD-10-CM

## 2018-01-18 DIAGNOSIS — Z940 Kidney transplant status: Secondary | ICD-10-CM | POA: Diagnosis not present

## 2018-01-18 DIAGNOSIS — Z79899 Other long term (current) drug therapy: Secondary | ICD-10-CM

## 2018-01-18 DIAGNOSIS — E1129 Type 2 diabetes mellitus with other diabetic kidney complication: Secondary | ICD-10-CM

## 2018-01-18 DIAGNOSIS — I10 Essential (primary) hypertension: Secondary | ICD-10-CM | POA: Diagnosis not present

## 2018-01-18 DIAGNOSIS — Z21 Asymptomatic human immunodeficiency virus [HIV] infection status: Secondary | ICD-10-CM

## 2018-01-18 LAB — POCT GLYCOSYLATED HEMOGLOBIN (HGB A1C): Hemoglobin A1C: 7.7

## 2018-01-18 LAB — GLUCOSE, CAPILLARY: Glucose-Capillary: 278 mg/dL — ABNORMAL HIGH (ref 65–99)

## 2018-01-18 MED ORDER — AMLODIPINE-ATORVASTATIN 10-40 MG PO TABS: 1.0000 | ORAL_TABLET | Freq: Every day | ORAL | 11 refills | Status: DC

## 2018-01-18 MED ORDER — INSULIN DEGLUDEC-LIRAGLUTIDE 100-3.6 UNIT-MG/ML ~~LOC~~ SOPN: 16.0000 [IU] | PEN_INJECTOR | Freq: Every morning | SUBCUTANEOUS | 4 refills | Status: DC

## 2018-01-18 NOTE — Assessment & Plan Note (Signed)
BP Readings from Last 3 Encounters:  01/18/18 (!) 165/65  01/16/18 (!) 145/80  11/05/17 (!) 159/74   Her blood pressure remained elevated.  According to patient whenever she visited her nephrologist her blood pressure remained within normal limit.  She do not want to make any changes without consulting her nephrologist at this time.  Her next appointment with Dr. Jimmy Footman is on upcoming Tuesday.  -We will continue amlodipine 10 mg along with Lasix 80 mg. -I combined her amlodipine and statin together with [place]-according to Dr. Julianne Rice recommendation to decrease pill burden. -She was also advised to talk with her nephrologist regarding her blood pressure being high in our clinic.

## 2018-01-18 NOTE — Assessment & Plan Note (Signed)
She follow-up at HIV clinic, her last CD count was 160 with viral load undetectable.

## 2018-01-18 NOTE — Progress Notes (Signed)
   CC: For follow-up of her diabetes and hypertension.  HPI:  [name] is a 72 y.o. past medical history as listed below came to the clinic for follow-up of her diabetes and hypertension.  She has no complaints today. Please see assessment and plan for her chronic problems.  Past Medical History:  Diagnosis Date  . Chronic kidney disease    ESRD secondary to DM, started HD in 2008, Dr. Jimmy Footman is her nephrologist, received renal transplant in 2013, no longer on dialysis  . Diabetes mellitus    Las HbA1C   . Dialysis patient [facility])    T Th Sat  . Hepatitis C    untreated. VL 3700000 in 2008  . History of hepatitis C 08/04/2015  . HIV infection ([place])    Dx in 1998 in [place], she presented with PCP pneumonia at that time. Has been tried on multiple regimens by her PCP in [place] before./ She has been on current ART for years now. Moved to [facility] in 2008 and is following with Dr. Tommy Medal since then.   . Hypertension    Review of Systems: Negative except mentioned in HPI  Physical Exam:  Vitals:   01/18/18 1356  BP: (!) 165/65  Pulse: 74  Temp: 98 F (36.7 C)  TempSrc: Oral  SpO2: 100%  Weight: 161 lb 12.8 oz (73.4 kg)  Height: 5\' 3"  (1.6 m)    General: Vital signs reviewed.  Patient is well-developed and well-nourished, in no acute distress and cooperative with exam.  Head: Normocephalic and atraumatic. Eyes: EOMI, conjunctivae normal, no scleral icterus.  Cardiovascular: RRR, S1 normal, S2 normal, no murmurs, gallops, or rubs. Pulmonary/Chest: Clear to auscultation bilaterally, no wheezes, rales, or rhonchi. Abdominal: Soft, non-tender, non-distended, BS +, no masses, organomegaly, or guarding present.  Extremities: No lower extremity edema bilaterally,  pulses symmetric and intact bilaterally. No cyanosis or clubbing. Neurological: A&O x3, Strength is normal and symmetric bilaterally, cranial nerve II-XII are grossly intact, no focal motor deficit, sensory intact to light  touch bilaterally.  Skin: Warm, dry and intact. No rashes or erythema. Psychiatric: Normal mood and affect. speech and behavior is normal. Cognition and memory are normal.  Assessment & Plan:   See Encounters Tab for problem based charting.  Patient discussed with Dr. Daryll Drown.

## 2018-01-18 NOTE — Patient Instructions (Addendum)
Thank you for visiting clinic today. As we discussed I am making some changes in your medication, combining your blood pressure pill called amlodipine and your cholesterol medicine together so you will take just 1 pill each day. I am also making changes to your insulin-stop taking your 70/30 and take this new one every morning as directed, this is a combination of insulin with another medicine for your better blood sugar control.  You will start with 16 units, I would like to see you back in 2 weeks and we will adjust your dosage accordingly. Please discuss your blood pressure with your kidney doctor as it was high in our clinic. To make your appointments for your DEXA scan, mammogram and to see your eye doctor. Please follow-up in 2 weeks.  Amlodipine; Atorvastatin oral tablets What is this medicine? AMLODIPINE; ATORVASTATIN (am LOE di peen; a TORE va sta tin) is a combination of 2 drugs. Amlodipine is a calcium-channel blocker used to lower high blood pressure. It also relieves chest pain caused by angina. Atorvastatin blocks the body's ability to make cholesterol. It can help lower blood cholesterol for patients who are at risk of getting heart disease or a stroke. It is only for patients whose cholesterol level is not controlled by diet. This medicine may be used for other purposes; ask your health care provider or pharmacist if you have questions. COMMON BRAND NAME(S): Caduet What should I tell my health care provider before I take this medicine? They need to know if you have any of these conditions: -an alcohol problem -heart problems, including heart failure or aortic stenosis -hormone disorder like diabetes or under-active thyroid -infection -kidney or liver disease -low blood pressure -other medical condition -recent surgery -seizures (convulsions) -severe injury -an unusual or allergic reaction to Amlodipine; Atorvastatin, medicines, foods, dyes, or preservatives -pregnant or  trying to get pregnant -breast-feeding How should I use this medicine? Take this medicine by mouth with a glass of water. Follow the directions on the prescription label. You can take the tablets with or without food. Do not change the amount of grapefruit juice you drink from day to day while taking this drug, or avoid grapefruit juice altogether. Take your doses at regular intervals. Do not take your medicine more often then directed. Do not suddenly stop taking this medicine. Ask your doctor or health care professional how you can gradually reduce the dose. Talk to your pediatrician regarding the use of this medicine in children. Special care may be needed. Overdosage: If you think you have taken too much of this medicine contact a poison control center or emergency room at once. NOTE: This medicine is only for you. Do not share this medicine with others. What if I miss a dose? If you miss a dose, take it as soon as you can. If it is almost time for your next dose, take only that dose. Do not take double or extra doses. What may interact with this medicine? Do not take this medicine with any of the following medications: -other cholesterol medicines known as statins like fluvastatin, lovastatin, pravastatin, and simvastatin -red yeast rice -telaprevir -telithromycin -voriconazole This medicine may also interact with the following medications: -antiviral medicines for HIV or AIDS -boceprevir -certain medicines for cholesterol like clofibrate, fenofibrate, and gemfibrozil -cyclosporine -digoxin -female hormones, like estrogens or progestins and birth control pills -grapefruit juice -medicines for fungal infections like fluconazole, itraconazole, ketoconazole, voriconazole -medicines for high blood pressure -niacin -rifampin -some antibiotics like clarithromycin, erythromycin, and troleandomycin  This list may not describe all possible interactions. Give your health care provider a list  of all the medicines, herbs, non-prescription drugs, or dietary supplements you use. Also tell them if you smoke, drink alcohol, or use illegal drugs. Some items may interact with your medicine. What should I watch for while using this medicine? Visit your doctor or health care professional for regular checks on your progress. You will need to have regular tests to make sure your liver is working properly. Tell your doctor or health care professional as soon as you can if you get any unexplained muscle pain, tenderness, or weakness, especially if you also have a fever and tiredness. Your doctor or health care professional may tell you to stop taking this medicine if you develop muscle problems. If your muscle problems do not go away after stopping this medicine, contact your health care professional. This medicine contains a cholesterol lowering agent, but is only part of a total cholesterol-lowering program. Your physician or dietitian can suggest a low-cholesterol and low-fat diet that will reduce your risk of getting heart and blood vessel disease. Avoid alcohol and smoking, and keep a proper exercise schedule. Check your blood pressure and pulse rate regularly. Ask your doctor or health care professional what your blood pressure and pulse rate should be and when you should contact him or her. This medicine may affect blood sugar levels. If you have diabetes, check with your doctor or health care professional before you change your diet or the dose of your diabetic medicine. You may feel dizzy or lightheaded. Do not drive, use machinery, or do anything that needs mental alertness until you know how this medicine affects you. To reduce the risk of dizzy or fainting spells, do not sit or stand up quickly, especially if you are an older patient. Avoid alcoholic drinks. They can make you more dizzy, increase flushing and rapid heartbeats. What side effects may I notice from receiving this medicine? Side  effects that you should report to your doctor or health care professional as soon as possible: -allergic reactions like skin rash, itching or hives, swelling of the face, lips, or tongue -chest pain -dark urine -fast, irregular heartbeat -feeling faint or lightheaded, falls -fever -muscle pain, weakness -redness, blistering, peeling or loosening of the skin, including inside the mouth -swelling of ankles, legs -trouble passing urine or change in the amount of urine -unusually weak or tired -yellowing of the eyes or skin Side effects that usually do not require medical attention (report to your doctor or health care professional if they continue or are bothersome): -diarrhea -facial flushing -gas -headache -nausea, vomiting -stomach upset or pain This list may not describe all possible side effects. Call your doctor for medical advice about side effects. You may report side effects to FDA at 1-800-FDA-1088. Where should I keep my medicine? Keep out of the reach of children. Store at room temperature between 15 and 30 degrees C (59 and 86 degrees F). Throw away any unused medicine after the expiration date. NOTE: This sheet is a summary. It may not cover all possible information. If you have questions about this medicine, talk to your doctor, pharmacist, or health care provider.  2018 Elsevier/Gold Standard (2011-10-10 13:14:47) Insulin Degludec; Liraglutide injection What is this medicine? INSULIN DEGLUDEC; LIRAGLUTIDE (IN su lin de GLOO dek; LIR a GLOO tide) is a combination of 2 medicines used to treat type 2 diabetes. This medicine lowers blood sugar. Treatment is combined with a balanced diet and  exercise. This medicine may be used for other purposes; ask your health care provider or pharmacist if you have questions. COMMON BRAND NAME(S): Xultophy What should I tell my health care provider before I take this medicine? They need to know if you have any of these  conditions: -endocrine tumors (MEN 2) or if someone in your family had these tumors -episodes of low blood sugar -gallbladder disease -high cholesterol -history of alcohol abuse problem -history of pancreatitis -kidney disease or if you are on dialysis -liver disease -previous swelling of the tongue, face, or lips with difficulty breathing, difficulty swallowing, hoarseness, or tightening of the throat -stomach problems -thyroid cancer or if someone in your family had thyroid cancer -an unusual or allergic reaction to insulin, liraglutide, other medicines, foods, dyes, or preservatives -pregnant or trying to get pregnant -breast-feeding How should I use this medicine? This medicine is for injection under the skin of your upper leg, stomach area, or upper arm. You will be taught how to prepare and give this medicine. Use exactly as directed. Take your medicine at regular intervals. Do not take it more often than directed. It is important that you put your used needles and syringes in a special sharps container. Do not put them in a trash can. If you do not have a sharps container, call your pharmacist or healthcare provider to get one. A special MedGuide will be given to you by the pharmacist with each prescription and refill. Be sure to read this information carefully each time. Talk to your pediatrician regarding the use of this medicine in children. Special care may be needed. Overdosage: If you think you have taken too much of this medicine contact a poison control center or emergency room at once. NOTE: This medicine is only for you. Do not share this medicine with others. What if I miss a dose? It is important not to miss a dose. Your health care professional or doctor should discuss a plan for missed doses with you. If you do miss a dose, follow their plan. Do not take double doses. What may interact with this medicine? -other medicines for diabetes Many medications may cause changes  in blood sugar, these include: -alcohol containing beverages -antiviral medicines for HIV or AIDS -aspirin and aspirin-like drugs -certain medicines for blood pressure, heart disease, irregular heart beat -chromium -diuretics -female hormones, such as estrogens or progestins, birth control pills -fenofibrate -gemfibrozil -isoniazid -lanreotide -female hormones or anabolic steroids -MAOIs like Carbex, Eldepryl, Marplan, Nardil, and Parnate -medicines for weight loss -medicines for allergies, asthma, cold, or cough -medicines for depression, anxiety, or psychotic disturbances -niacin -nicotine -NSAIDs, medicines for pain and inflammation, like ibuprofen or naproxen -octreotide -pasireotide -pentamidine -phenytoin -probenecid -quinolone antibiotics such as ciprofloxacin, levofloxacin, ofloxacin -some herbal dietary supplements -steroid medicines such as prednisone or cortisone -sulfamethoxazole; trimethoprim -thyroid hormones Some medications can hide the warning symptoms of low blood sugar (hypoglycemia). You may need to monitor your blood sugar more closely if you are taking one of these medications. These include: -beta-blockers, often used for high blood pressure or heart problems (examples include atenolol, metoprolol, propranolol) -clonidine -guanethidine -reserpine This list may not describe all possible interactions. Give your health care provider a list of all the medicines, herbs, non-prescription drugs, or dietary supplements you use. Also tell them if you smoke, drink alcohol, or use illegal drugs. Some items may interact with your medicine. What should I watch for while using this medicine? Visit your health care professional or doctor for regular  checks on your progress. A test called the HbA1C (A1C) will be monitored. This is a simple blood test. It measures your blood sugar control over the last 2 to 3 months. You will receive this test every 3 to 6 months. Learn how  to check your blood sugar. Learn the symptoms of low and high blood sugar and how to manage them. Always carry a quick-source of sugar with you in case you have symptoms of low blood sugar. Examples include hard sugar candy or glucose tablets. Make sure others know that you can choke if you eat or drink when you develop serious symptoms of low blood sugar, such as seizures or unconsciousness. They must get medical help at once. Tell your doctor or health care professional if you have high blood sugar. You might need to change the dose of your medicine. If you are sick or exercising more than usual, you might need to change the dose of your medicine. Do not skip meals. Ask your doctor or health care professional if you should avoid alcohol. Many nonprescription cough and cold products contain sugar or alcohol. These can affect blood sugar. Pens should never be shared. Even if the needle is changed, sharing may result in passing of viruses like hepatitis or HIV. Wear a medical ID bracelet or chain, and carry a card that describes your disease and details of your medicine and dosage times. What side effects may I notice from receiving this medicine? Side effects that you should report to your doctor or health care professional as soon as possible: -allergic reactions like skin rash, itching or hives, swelling of the face, lips, or tongue -breathing problems -fever, chills -loss of appetite -signs and symptoms of high blood sugar such as dizziness, dry mouth, dry skin, fruity breath, nausea, stomach pain, increased hunger or thirst, increased urination -signs and symptoms of low blood sugar such as feeling anxious, confusion, dizziness, increased hunger, unusually weak or tired, sweating, shakiness, cold, irritable, headache, blurred vision, fast heartbeat, loss of consciousness -trouble passing urine or change in the amount of urine -unusual stomach pain or upset -vomiting Side effects that usually do  not require medical attention (report to your doctor or health care professional if they continue or are bothersome): -constipation -diarrhea -fatigue -headache -increase or decrease in fatty tissue under the skin due to overuse of a particular injection site -itching, burning, swelling, or rash at site where injected -nausea This list may not describe all possible side effects. Call your doctor for medical advice about side effects. You may report side effects to FDA at 1-800-FDA-1088. Where should I keep my medicine? Keep out of the reach of children. Store unopened pens in a refrigerator between 2 and 8 degrees C (36 and 46 degrees F.) Do not store in the freezer or directly adjacent to the refrigerator cooling element. Do not use the pen if it has been frozen. After first use, the pen can be stored for 21 days at controlled room temperature of 15 to 30 degrees C (59 to 86 degrees F) or in a refrigerator 2 to 8 degrees C (36 to 46 degrees F). Do not expose to excessive heat or direct light. Do not use past the expiration date or 21 days after the pen is first in use. Do not store your pen with the needle attached. If the needle is left on, medicine may leak from the pen. NOTE: This sheet is a summary. It may not cover all possible information. If you  have questions about this medicine, talk to your doctor, pharmacist, or health care provider.  2018 Elsevier/Gold Standard (2016-12-06 13:57:15)

## 2018-01-18 NOTE — Assessment & Plan Note (Signed)
She was asked to make an appointment for DEXA scan and mammogram, according to patient she will do so with the next week. She was also asked to make an appointment with her eye doctor. We will check microalbumin urea today.

## 2018-01-18 NOTE — Assessment & Plan Note (Signed)
Her A1c today was 7.7-decreased from 8.3 from previous check. She did bring her glucometer which shows an average CBG of 162 with high at 211 and low at 114.  She did not had any episodes of hypo-or hyperglycemia. According to patient she cannot control herself for eating cookies and cakes which is the main problem in controlling her blood sugar. She was also using 70/30, 23 units just in the morning and do not inject anything at night.  -Discontinue 70/30. -Start her on Xultrophy (combination of degludec and liraglutide).100-3.6-at 16 units daily. -We will up in 2 weeks so we can titrate her dosage. -She was advised to check her blood sugar at least 3 times a day and bring her glucometer with her during next follow-up visit.

## 2018-01-18 NOTE — Assessment & Plan Note (Signed)
She follow-up with [facility], creatinine continued to deteriorate. She is going for a fistula placement on January 29, 2018 with an intention that she might need dialysis within the next 64-month. 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  She was advised to continue follow-up with her nephrologist.

## 2018-01-19 LAB — MICROALBUMIN / CREATININE URINE RATIO
Creatinine, Urine: 43.9 mg/dL
MICROALB/CREAT RATIO: 2802.1 mg/g{creat} — AB (ref 0.0–30.0)
Microalbumin, Urine: 1230.1 ug/mL

## 2018-01-19 NOTE — Progress Notes (Signed)
Internal Medicine Clinic Attending  Case discussed with Dr. Amin at the time of the visit.  We reviewed the resident's history and exam and pertinent patient test results.  I agree with the assessment, diagnosis, and plan of care documented in the resident's note.    

## 2018-01-22 ENCOUNTER — Other Ambulatory Visit: Payer: Self-pay

## 2018-01-22 DIAGNOSIS — D631 Anemia in chronic kidney disease: Secondary | ICD-10-CM | POA: Diagnosis not present

## 2018-01-22 DIAGNOSIS — N184 Chronic kidney disease, stage 4 (severe): Secondary | ICD-10-CM | POA: Diagnosis not present

## 2018-01-22 DIAGNOSIS — Z940 Kidney transplant status: Secondary | ICD-10-CM | POA: Diagnosis not present

## 2018-01-22 DIAGNOSIS — I129 Hypertensive chronic kidney disease with stage 1 through stage 4 chronic kidney disease, or unspecified chronic kidney disease: Secondary | ICD-10-CM | POA: Diagnosis not present

## 2018-01-22 DIAGNOSIS — B20 Human immunodeficiency virus [HIV] disease: Secondary | ICD-10-CM | POA: Diagnosis not present

## 2018-01-22 DIAGNOSIS — E1122 Type 2 diabetes mellitus with diabetic chronic kidney disease: Secondary | ICD-10-CM | POA: Diagnosis not present

## 2018-01-22 DIAGNOSIS — R809 Proteinuria, unspecified: Secondary | ICD-10-CM | POA: Diagnosis not present

## 2018-01-22 DIAGNOSIS — Z794 Long term (current) use of insulin: Principal | ICD-10-CM

## 2018-01-22 DIAGNOSIS — E782 Mixed hyperlipidemia: Secondary | ICD-10-CM | POA: Diagnosis not present

## 2018-01-22 DIAGNOSIS — R319 Hematuria, unspecified: Secondary | ICD-10-CM | POA: Diagnosis not present

## 2018-01-22 DIAGNOSIS — E118 Type 2 diabetes mellitus with unspecified complications: Secondary | ICD-10-CM | POA: Diagnosis not present

## 2018-01-22 DIAGNOSIS — N2581 Secondary hyperparathyroidism of renal origin: Secondary | ICD-10-CM | POA: Diagnosis not present

## 2018-01-22 NOTE — Telephone Encounter (Signed)
Insulin Syringe-Needle U-100 31G X 15/64" 0.3 ML MISC   Refill request @ [facility] on [address] Would like a call back.

## 2018-01-23 MED ORDER — "INSULIN SYRINGE-NEEDLE U-100 31G X 15/64"" 0.3 ML MISC": 5 refills | Status: DC

## 2018-01-25 ENCOUNTER — Other Ambulatory Visit: Payer: Self-pay

## 2018-01-25 ENCOUNTER — Encounter (HOSPITAL_COMMUNITY): Payer: Self-pay | Admitting: *Deleted

## 2018-01-25 ENCOUNTER — Encounter: Payer: Medicare Other | Admitting: Internal Medicine

## 2018-01-25 NOTE — Progress Notes (Signed)
Pt denies SOB, chest pain, and being under the care of a cardiologist. Pt denies having a chest x ray and EKG within the last year. Pt stated that her Caduet and Xultophy insulin have not been filled yet. Pt made aware to stop taking otc vitamins, fish oil and herbal medications. Do not take any NSAIDs ie: Ibuprofen, Advil, Naproxen (Aleve), Motrin, BC and Goody Powder. Pt made aware to check BG every 2 hours prior to arrival to hospital on DOS. Pt made aware to treat a BG < 70 with 4 ounces of apple juice, wait 15 minutes after intervention to recheck BG, if BG remains < 70, call Short Stay unit to speak with a nurse. Pt verbalized understanding of all pre-op instructions.

## 2018-01-28 ENCOUNTER — Other Ambulatory Visit: Payer: Self-pay | Admitting: Internal Medicine

## 2018-01-28 NOTE — Telephone Encounter (Signed)
Patient has the new insulin for the Epic pen, she needs a prescription for the needles, she got the prescription for needles but its for the old insulin she was taking,

## 2018-01-29 ENCOUNTER — Other Ambulatory Visit: Payer: Self-pay | Admitting: Internal Medicine

## 2018-01-29 ENCOUNTER — Ambulatory Visit (HOSPITAL_COMMUNITY): Payer: Medicare Other | Admitting: Anesthesiology

## 2018-01-29 ENCOUNTER — Ambulatory Visit (HOSPITAL_COMMUNITY)
Admission: RE | Admit: 2018-01-29 | Discharge: 2018-01-29 | Disposition: A | Discharge status: Home / Self Care | Payer: Medicare Other | Source: Ambulatory Visit | Attending: Vascular Surgery | Admitting: Vascular Surgery

## 2018-01-29 ENCOUNTER — Encounter (HOSPITAL_COMMUNITY)
Admission: RE | Disposition: A | Discharge status: Home / Self Care | Payer: Self-pay | Source: Ambulatory Visit | Attending: Vascular Surgery

## 2018-01-29 ENCOUNTER — Other Ambulatory Visit: Payer: Self-pay

## 2018-01-29 ENCOUNTER — Encounter (HOSPITAL_COMMUNITY): Payer: Self-pay | Admitting: Urology

## 2018-01-29 DIAGNOSIS — Z940 Kidney transplant status: Secondary | ICD-10-CM | POA: Diagnosis not present

## 2018-01-29 DIAGNOSIS — I10 Essential (primary) hypertension: Secondary | ICD-10-CM | POA: Insufficient documentation

## 2018-01-29 DIAGNOSIS — E1122 Type 2 diabetes mellitus with diabetic chronic kidney disease: Secondary | ICD-10-CM | POA: Insufficient documentation

## 2018-01-29 DIAGNOSIS — Z21 Asymptomatic human immunodeficiency virus [HIV] infection status: Secondary | ICD-10-CM | POA: Insufficient documentation

## 2018-01-29 DIAGNOSIS — Z87891 Personal history of nicotine dependence: Secondary | ICD-10-CM | POA: Insufficient documentation

## 2018-01-29 DIAGNOSIS — Z794 Long term (current) use of insulin: Secondary | ICD-10-CM | POA: Diagnosis not present

## 2018-01-29 DIAGNOSIS — K219 Gastro-esophageal reflux disease without esophagitis: Secondary | ICD-10-CM | POA: Insufficient documentation

## 2018-01-29 DIAGNOSIS — D631 Anemia in chronic kidney disease: Secondary | ICD-10-CM | POA: Insufficient documentation

## 2018-01-29 DIAGNOSIS — Z7952 Long term (current) use of systemic steroids: Secondary | ICD-10-CM | POA: Insufficient documentation

## 2018-01-29 DIAGNOSIS — Z7982 Long term (current) use of aspirin: Secondary | ICD-10-CM | POA: Diagnosis not present

## 2018-01-29 DIAGNOSIS — Z79899 Other long term (current) drug therapy: Secondary | ICD-10-CM | POA: Diagnosis not present

## 2018-01-29 DIAGNOSIS — N185 Chronic kidney disease, stage 5: Secondary | ICD-10-CM | POA: Insufficient documentation

## 2018-01-29 DIAGNOSIS — Z7951 Long term (current) use of inhaled steroids: Secondary | ICD-10-CM | POA: Diagnosis not present

## 2018-01-29 DIAGNOSIS — I129 Hypertensive chronic kidney disease with stage 1 through stage 4 chronic kidney disease, or unspecified chronic kidney disease: Secondary | ICD-10-CM | POA: Diagnosis not present

## 2018-01-29 DIAGNOSIS — N2581 Secondary hyperparathyroidism of renal origin: Secondary | ICD-10-CM | POA: Insufficient documentation

## 2018-01-29 DIAGNOSIS — N184 Chronic kidney disease, stage 4 (severe): Secondary | ICD-10-CM | POA: Diagnosis not present

## 2018-01-29 HISTORY — DX: Other specified postprocedural states: Z98.890

## 2018-01-29 HISTORY — DX: Other specified postprocedural states: R11.2

## 2018-01-29 HISTORY — DX: Gastro-esophageal reflux disease without esophagitis: K21.9

## 2018-01-29 HISTORY — DX: Presence of spectacles and contact lenses: Z97.3

## 2018-01-29 HISTORY — PX: BASCILIC VEIN TRANSPOSITION: SHX5742

## 2018-01-29 HISTORY — DX: Unspecified cataract: H26.9

## 2018-01-29 LAB — POCT I-STAT 4, (NA,K, GLUC, HGB,HCT)
GLUCOSE: 144 mg/dL — AB (ref 65–99)
HEMATOCRIT: 35 % — AB (ref 36.0–46.0)
Hemoglobin: 11.9 g/dL — ABNORMAL LOW (ref 12.0–15.0)
POTASSIUM: 3.2 mmol/L — AB (ref 3.5–5.1)
Sodium: 140 mmol/L (ref 135–145)

## 2018-01-29 LAB — GLUCOSE, CAPILLARY
GLUCOSE-CAPILLARY: 135 mg/dL — AB (ref 65–99)
Glucose-Capillary: 133 mg/dL — ABNORMAL HIGH (ref 65–99)
Glucose-Capillary: 141 mg/dL — ABNORMAL HIGH (ref 65–99)

## 2018-01-29 SURGERY — TRANSPOSITION, VEIN, BASILIC
Anesthesia: General | Site: Arm Lower | Laterality: Left

## 2018-01-29 MED ORDER — DEXAMETHASONE SODIUM PHOSPHATE 10 MG/ML IJ SOLN
INTRAMUSCULAR | Status: AC
  Filled 2018-01-29: qty 1

## 2018-01-29 MED ORDER — "PEN NEEDLES 5/16"" 31G X 8 MM MISC": 16.0000 [IU] | Freq: Every morning | 1 refills | Status: DC

## 2018-01-29 MED ORDER — ONDANSETRON HCL 4 MG/2ML IJ SOLN
INTRAMUSCULAR | Status: DC | PRN
  Administered 2018-01-29: 4 mg via INTRAVENOUS

## 2018-01-29 MED ORDER — 0.9 % SODIUM CHLORIDE (POUR BTL) OPTIME
TOPICAL | Status: DC | PRN
  Administered 2018-01-29: 1000 mL

## 2018-01-29 MED ORDER — OXYCODONE-ACETAMINOPHEN 5-325 MG PO TABS: 1.0000 | ORAL_TABLET | Freq: Four times a day (QID) | ORAL | 0 refills | Status: DC | PRN

## 2018-01-29 MED ORDER — MIDAZOLAM HCL 2 MG/2ML IJ SOLN
INTRAMUSCULAR | Status: AC
  Filled 2018-01-29: qty 2

## 2018-01-29 MED ORDER — PROTAMINE SULFATE 10 MG/ML IV SOLN
INTRAVENOUS | Status: AC
  Filled 2018-01-29: qty 5

## 2018-01-29 MED ORDER — INSULIN PEN NEEDLE 31G X 5 MM MISC: 16.0000 [IU] | Freq: Every day | 2 refills | Status: DC

## 2018-01-29 MED ORDER — SODIUM CHLORIDE 0.9 % IV SOLN
1.5000 g | INTRAVENOUS | Status: AC
  Administered 2018-01-29: 1.5 g via INTRAVENOUS
  Filled 2018-01-29: qty 1.5

## 2018-01-29 MED ORDER — PROTAMINE SULFATE 10 MG/ML IV SOLN
INTRAVENOUS | Status: DC | PRN
  Administered 2018-01-29: 20 mg via INTRAVENOUS
  Administered 2018-01-29: 10 mg via INTRAVENOUS

## 2018-01-29 MED ORDER — HEPARIN SODIUM (PORCINE) 1000 UNIT/ML IJ SOLN
INTRAMUSCULAR | Status: DC | PRN
  Administered 2018-01-29: 6000 [IU] via INTRAVENOUS

## 2018-01-29 MED ORDER — CEFAZOLIN SODIUM 1 G IJ SOLR
INTRAMUSCULAR | Status: AC
  Filled 2018-01-29: qty 30

## 2018-01-29 MED ORDER — FENTANYL CITRATE (PF) 250 MCG/5ML IJ SOLN
INTRAMUSCULAR | Status: AC
  Filled 2018-01-29: qty 5

## 2018-01-29 MED ORDER — CHLORHEXIDINE GLUCONATE 4 % EX LIQD: 60.0000 mL | Freq: Once | CUTANEOUS | Status: DC

## 2018-01-29 MED ORDER — LIDOCAINE 2% (20 MG/ML) 5 ML SYRINGE
INTRAMUSCULAR | Status: AC
  Filled 2018-01-29: qty 5

## 2018-01-29 MED ORDER — MEPERIDINE HCL 50 MG/ML IJ SOLN: 6.2500 mg | INTRAMUSCULAR | Status: DC | PRN

## 2018-01-29 MED ORDER — MIDAZOLAM HCL 5 MG/5ML IJ SOLN
INTRAMUSCULAR | Status: DC | PRN
  Administered 2018-01-29: 1 mg via INTRAVENOUS

## 2018-01-29 MED ORDER — HYDROMORPHONE HCL 1 MG/ML IJ SOLN: 0.2500 mg | INTRAMUSCULAR | Status: DC | PRN

## 2018-01-29 MED ORDER — EPHEDRINE 5 MG/ML INJ
INTRAVENOUS | Status: AC
  Filled 2018-01-29: qty 10

## 2018-01-29 MED ORDER — ONDANSETRON HCL 4 MG/2ML IJ SOLN
INTRAMUSCULAR | Status: AC
  Filled 2018-01-29: qty 2

## 2018-01-29 MED ORDER — LIDOCAINE-EPINEPHRINE (PF) 1 %-1:200000 IJ SOLN
INTRAMUSCULAR | Status: AC
  Filled 2018-01-29: qty 30

## 2018-01-29 MED ORDER — SODIUM CHLORIDE 0.9 % IV SOLN
INTRAVENOUS | Status: DC
  Administered 2018-01-29 (×2): via INTRAVENOUS

## 2018-01-29 MED ORDER — PROPOFOL 10 MG/ML IV BOLUS
INTRAVENOUS | Status: AC
  Filled 2018-01-29: qty 20

## 2018-01-29 MED ORDER — FENTANYL CITRATE (PF) 100 MCG/2ML IJ SOLN
INTRAMUSCULAR | Status: DC | PRN
  Administered 2018-01-29 (×5): 25 ug via INTRAVENOUS
  Administered 2018-01-29: 50 ug via INTRAVENOUS

## 2018-01-29 MED ORDER — EPHEDRINE SULFATE-NACL 50-0.9 MG/10ML-% IV SOSY
PREFILLED_SYRINGE | INTRAVENOUS | Status: DC | PRN
  Administered 2018-01-29: 5 mg via INTRAVENOUS

## 2018-01-29 MED ORDER — PROMETHAZINE HCL 25 MG/ML IJ SOLN: 6.2500 mg | INTRAMUSCULAR | Status: DC | PRN

## 2018-01-29 MED ORDER — SODIUM CHLORIDE 0.9 % IV SOLN
INTRAVENOUS | Status: DC | PRN
  Administered 2018-01-29: 10:00:00

## 2018-01-29 MED ORDER — DEXAMETHASONE SODIUM PHOSPHATE 4 MG/ML IJ SOLN
INTRAMUSCULAR | Status: DC | PRN
  Administered 2018-01-29: 5 mg via INTRAVENOUS

## 2018-01-29 MED ORDER — PROPOFOL 10 MG/ML IV BOLUS
INTRAVENOUS | Status: DC | PRN
  Administered 2018-01-29: 130 mg via INTRAVENOUS

## 2018-01-29 MED ORDER — LIDOCAINE 2% (20 MG/ML) 5 ML SYRINGE
INTRAMUSCULAR | Status: DC | PRN
  Administered 2018-01-29: 100 mg via INTRAVENOUS

## 2018-01-29 MED ORDER — HEPARIN SODIUM (PORCINE) 1000 UNIT/ML IJ SOLN
INTRAMUSCULAR | Status: AC
  Filled 2018-01-29: qty 1

## 2018-01-29 SURGICAL SUPPLY — 33 items
ARMBAND PINK RESTRICT EXTREMIT (MISCELLANEOUS) ×3 IMPLANT
CANISTER SUCT 3000ML PPV (MISCELLANEOUS) ×3 IMPLANT
CANNULA VESSEL 3MM 2 BLNT TIP (CANNULA) ×3 IMPLANT
CLIP VESOCCLUDE MED 24/CT (CLIP) ×3 IMPLANT
CLIP VESOCCLUDE SM WIDE 24/CT (CLIP) ×3 IMPLANT
COVER PROBE W GEL 5X96 (DRAPES) IMPLANT
DECANTER SPIKE VIAL GLASS SM (MISCELLANEOUS) ×3 IMPLANT
DERMABOND ADVANCED (GAUZE/BANDAGES/DRESSINGS) ×2
DERMABOND ADVANCED .7 DNX12 (GAUZE/BANDAGES/DRESSINGS) ×1 IMPLANT
ELECT REM PT RETURN 9FT ADLT (ELECTROSURGICAL) ×3
ELECTRODE REM PT RTRN 9FT ADLT (ELECTROSURGICAL) ×1 IMPLANT
GLOVE BIO SURGEON STRL SZ7.5 (GLOVE) ×3 IMPLANT
GLOVE BIOGEL PI IND STRL 8 (GLOVE) ×1 IMPLANT
GLOVE BIOGEL PI INDICATOR 8 (GLOVE) ×2
GOWN STRL REUS W/ TWL LRG LVL3 (GOWN DISPOSABLE) ×3 IMPLANT
GOWN STRL REUS W/ TWL XL LVL3 (GOWN DISPOSABLE) ×1 IMPLANT
GOWN STRL REUS W/TWL 2XL LVL3 (GOWN DISPOSABLE) ×3 IMPLANT
GOWN STRL REUS W/TWL LRG LVL3 (GOWN DISPOSABLE) ×6
GOWN STRL REUS W/TWL XL LVL3 (GOWN DISPOSABLE) ×2
KIT BASIN OR (CUSTOM PROCEDURE TRAY) ×3 IMPLANT
KIT ROOM TURNOVER OR (KITS) ×3 IMPLANT
NS IRRIG 1000ML POUR BTL (IV SOLUTION) ×3 IMPLANT
PACK CV ACCESS (CUSTOM PROCEDURE TRAY) ×3 IMPLANT
PAD ARMBOARD 7.5X6 YLW CONV (MISCELLANEOUS) ×6 IMPLANT
SPONGE SURGIFOAM ABS GEL 100 (HEMOSTASIS) IMPLANT
SUT PROLENE 6 0 BV (SUTURE) ×6 IMPLANT
SUT SILK 2 0 SH (SUTURE) IMPLANT
SUT VIC AB 3-0 SH 27 (SUTURE) ×2
SUT VIC AB 3-0 SH 27X BRD (SUTURE) ×1 IMPLANT
SUT VICRYL 4-0 PS2 18IN ABS (SUTURE) ×3 IMPLANT
TOWEL GREEN STERILE (TOWEL DISPOSABLE) ×3 IMPLANT
UNDERPAD 30X30 (UNDERPADS AND DIAPERS) ×3 IMPLANT
WATER STERILE IRR 1000ML POUR (IV SOLUTION) ×3 IMPLANT

## 2018-01-29 NOTE — Anesthesia Preprocedure Evaluation (Signed)
Anesthesia Evaluation  Patient identified by MRN, date of birth, ID band Patient awake    Reviewed: Allergy & Precautions, NPO status , Patient's Chart, lab work & pertinent test results  History of Anesthesia Complications (+) PONV and history of anesthetic complications  Airway Mallampati: II  TM Distance: >3 FB Neck ROM: Full    Dental no notable dental hx.    Pulmonary neg pulmonary ROS, former smoker,    Pulmonary exam normal breath sounds clear to auscultation       Cardiovascular hypertension, negative cardio ROS Normal cardiovascular exam Rhythm:Regular Rate:Normal     Neuro/Psych negative neurological ROS  negative psych ROS   GI/Hepatic Neg liver ROS, GERD  ,(+) Hepatitis -  Endo/Other  negative endocrine ROSdiabetes  Renal/GU Renal disease     Musculoskeletal negative musculoskeletal ROS (+)   Abdominal   Peds  Hematology  (+) anemia ,   Anesthesia Other Findings   Reproductive/Obstetrics negative OB ROS                             Anesthesia Physical Anesthesia Plan  ASA: III  Anesthesia Plan: General   Post-op Pain Management:    Induction: Intravenous  PONV Risk Score and Plan: 4 or greater and Ondansetron and Dexamethasone  Airway Management Planned: LMA  Additional Equipment:   Intra-op Plan:   Post-operative Plan: Extubation in OR  Informed Consent: I have reviewed the patients History and Physical, chart, labs and discussed the procedure including the risks, benefits and alternatives for the proposed anesthesia with the patient or authorized representative who has indicated his/her understanding and acceptance.   Dental advisory given  Plan Discussed with: CRNA  Anesthesia Plan Comments:         Anesthesia Quick Evaluation

## 2018-01-29 NOTE — Anesthesia Procedure Notes (Addendum)
Procedure Name: LMA Insertion Date/Time: 01/29/2018 9:31 AM Performed by: Candis Shine, CRNA Pre-anesthesia Checklist: Patient identified Patient Re-evaluated:Patient Re-evaluated prior to induction Preoxygenation: Pre-oxygenation with 100% oxygen Induction Type: IV induction Ventilation: Mask ventilation without difficulty LMA: LMA inserted LMA Size: 4.0 Placement Confirmation: positive ETCO2

## 2018-01-29 NOTE — Discharge Instructions (Signed)
° °  [facility]  Discharge Instructions  AV Fistula or Graft Surgery for Dialysis Access  Please refer to the following instructions for your post-procedure care. Your surgeon or physician assistant will discuss any changes with you.  Activity  You may drive the day following your surgery, if you are comfortable and no longer taking prescription pain medication. Resume full activity as the soreness in your incision resolves.  Bathing/Showering  You may shower after you go home. Keep your incision dry for 48 hours. Do not soak in a bathtub, hot tub, or swim until the incision heals completely. You may not shower if you have a hemodialysis catheter.  Incision Care  Clean your incision with mild soap and water after 48 hours. Pat the area dry with a clean towel. You do not need a bandage unless otherwise instructed. Do not apply any ointments or creams to your incision. You may have skin glue on your incision. Do not peel it off. It will come off on its own in about one week. Your arm may swell a bit after surgery. To reduce swelling use pillows to elevate your arm so it is above your heart. Your doctor will tell you if you need to lightly wrap your arm with an ACE bandage.  Diet  Resume your normal diet. There are not special food restrictions following this procedure. In order to heal from your surgery, it is CRITICAL to get adequate nutrition. Your body requires vitamins, minerals, and protein. Vegetables are the best source of vitamins and minerals. Vegetables also provide the perfect balance of protein. Processed food has little nutritional value, so try to avoid this.  Medications  Resume taking all of your medications. If your incision is causing pain, you may take over-the counter pain relievers such as acetaminophen (Tylenol). If you were prescribed a stronger pain medication, please be aware these medications can cause nausea and constipation. Prevent  nausea by taking the medication with a snack or meal. Avoid constipation by drinking plenty of fluids and eating foods with high amount of fiber, such as fruits, vegetables, and grains.  Do not take Tylenol if you are taking prescription pain medications.  Follow up Your surgeon may want to see you in the office following your access surgery. If so, this will be arranged at the time of your surgery.  Please call us immediately for any of the following conditions:  Increased pain, redness, drainage (pus) from your incision site Fever of 101 degrees or higher Severe or worsening pain at your incision site Hand pain or numbness.  Reduce your risk of vascular disease:  Stop smoking. If you would like help, call [facility] at 1-800-QUIT-NOW (867)424-2145) or [place] at [place] your cholesterol Maintain a desired weight Control your diabetes Keep your blood pressure down  Dialysis  It will take several weeks to several months for your new dialysis access to be ready for use. Your surgeon will determine when it is okay to use it. Your nephrologist will continue to direct your dialysis. You can continue to use your Permcath until your new access is ready for use.   01/29/2018 [name] [id number] [birth year]  Surgeon(s): Angelia Mould, MD  Procedure(s): Creation of left brachial cephalic AV fistula  x Do not stick fistula for 12 weeks    If you have any questions, please call the office at [phone].

## 2018-01-29 NOTE — Anesthesia Postprocedure Evaluation (Signed)
Anesthesia Post Note  Patient: [name]  Procedure(s) Performed: RIGHT BRACIOCEPHALIC AV FISTULA (Left Arm Lower)     Patient location during evaluation: PACU Anesthesia Type: General Level of consciousness: sedated and patient cooperative Pain management: pain level controlled Vital Signs Assessment: post-procedure vital signs reviewed and stable Respiratory status: spontaneous breathing Cardiovascular status: stable Anesthetic complications: no    Last Vitals:  Vitals:   01/29/18 1145 01/29/18 1201  BP: (!) 150/78   Pulse:  69  Resp: 20 15  Temp:    SpO2: 95% 93%    Last Pain:  Vitals:   01/29/18 1201  TempSrc:   PainSc: 0-No pain                 Nolon Nations

## 2018-01-29 NOTE — Op Note (Signed)
    NAME: [name]    MRN: [id number] DOB: 1945-12-08    DATE OF OPERATION: 01/29/2018  PREOP DIAGNOSIS:    Stage IV chronic kidney disease  POSTOP DIAGNOSIS:    Same  PROCEDURE:    Left brachiocephalic AV fistula  SURGEON: Judeth Cornfield. Scot Dock, MD, FACS  ASSIST: Leontine Locket, PA  ANESTHESIA: General  EBL: Minimal  INDICATIONS:    [name] is a 72 y.o. female who presents for new access.  If we were not able to place a fistula we were asked to not place an AV graft.  FINDINGS:   3.5 mm upper arm cephalic vein  TECHNIQUE:   The patient was taken to the operating room and received a general anesthetic.  The left arm was prepped and draped in usual sterile fashion.  A transverse incision was made above the antecubital level.  Here the cephalic vein was dissected free.  Branches were divided between clips and 3-0 silk ties.  Distally the vein split and I ligated both branches to allow for a widely spatulated anastomosis.  The brachial artery was dissected free beneath the fascia.  The patient was heparinized.  The brachial artery was clamped proximally and distally and a longitudinal arteriotomy was made.  The vein was sewn end to side to the artery using.  At the completion was a good thrill in the fistula and a radial and ulnar signal with the Doppler.  The heparin was partially reversed with protamine.  The wound was closed with a deep layer of 3-0 Vicryl to skin closed with 4-0 Vicryl.  Dermabond was applied.  The patient tolerated the procedure well and was transferred to the recovery room in stable condition.  All needle and sponge counts were correct.  Deitra Mayo, MD, FACS [facility]  DATE OF DICTATION:   01/29/2018

## 2018-01-29 NOTE — Interval H&P Note (Signed)
History and Physical Interval Note:  01/29/2018 8:47 AM  [name]  has presented today for surgery, with the diagnosis of Chronic kidney Disease Stage 4  The various methods of treatment have been discussed with the patient and family. After consideration of risks, benefits and other options for treatment, the patient has consented to  Procedure(s): BASILIC [place] (Left) as a surgical intervention .  The patient's history has been reviewed, patient examined, no change in status, stable for surgery.  I have reviewed the patient's chart and labs.  Questions were answered to the patient's satisfaction.     Deitra Mayo

## 2018-01-29 NOTE — Transfer of Care (Signed)
Immediate Anesthesia Transfer of Care Note  Patient: OZELLE BRUBACHER  Procedure(s) Performed: RIGHT BRACIOCEPHALIC AV FISTULA (Left Arm Lower)  Patient Location: PACU  Anesthesia Type:General  Level of Consciousness: drowsy and patient cooperative  Airway & Oxygen Therapy: Patient Spontanous Breathing and Patient connected to nasal cannula oxygen  Post-op Assessment: Report given to RN and Post -op Vital signs reviewed and stable  Post vital signs: Reviewed and stable  Last Vitals:  Vitals:   01/29/18 0659 01/29/18 0703  BP: (!) 168/81   Pulse: 67   Resp: 19   Temp:  36.8 C  SpO2: 100%     Last Pain:  Vitals:   01/29/18 0703  TempSrc: Oral      Patients Stated Pain Goal: 3 (57/01/77 9390)  Complications: No apparent anesthesia complications

## 2018-01-30 ENCOUNTER — Encounter (HOSPITAL_COMMUNITY): Payer: Self-pay | Admitting: Vascular Surgery

## 2018-01-31 ENCOUNTER — Telehealth: Payer: Self-pay | Admitting: Vascular Surgery

## 2018-01-31 NOTE — Telephone Encounter (Signed)
-----   Message from Mena Goes, RN sent at 01/29/2018 11:20 AM EST ----- Regarding: 6 weeks with duplex   ----- Message ----- From: Angelia Mould, MD Sent: 01/29/2018  10:41 AM To: [facility] Charge Pool Subject: charge and f/u                                  PROCEDURE:   Left brachiocephalic AV fistula  SURGEON: Judeth Cornfield. Scot Dock, MD, FACS  ASSIST: Leontine Locket, PA  He will need a follow-up visit in 6 weeks with a duplex at that time to check on the maturation of her fistula.  Thank you.

## 2018-01-31 NOTE — Telephone Encounter (Signed)
Sched appt 03/13/18; lab at 3:00 and MD at 3:45. Lm on hm# to inform pt of appt.

## 2018-02-01 ENCOUNTER — Encounter: Payer: Medicare Other | Admitting: Internal Medicine

## 2018-02-01 ENCOUNTER — Other Ambulatory Visit: Payer: Self-pay

## 2018-02-01 DIAGNOSIS — N186 End stage renal disease: Secondary | ICD-10-CM

## 2018-02-01 DIAGNOSIS — Z48812 Encounter for surgical aftercare following surgery on the circulatory system: Secondary | ICD-10-CM

## 2018-02-01 DIAGNOSIS — Z992 Dependence on renal dialysis: Principal | ICD-10-CM

## 2018-02-05 ENCOUNTER — Other Ambulatory Visit: Payer: Self-pay | Admitting: Internal Medicine

## 2018-02-05 DIAGNOSIS — J069 Acute upper respiratory infection, unspecified: Secondary | ICD-10-CM

## 2018-02-06 NOTE — Telephone Encounter (Signed)
She should contact her nephrologist regarding Sensipar.

## 2018-02-22 ENCOUNTER — Other Ambulatory Visit: Payer: Self-pay | Admitting: Internal Medicine

## 2018-03-02 ENCOUNTER — Other Ambulatory Visit: Payer: Self-pay | Admitting: Internal Medicine

## 2018-03-02 DIAGNOSIS — J069 Acute upper respiratory infection, unspecified: Secondary | ICD-10-CM

## 2018-03-07 ENCOUNTER — Other Ambulatory Visit: Payer: Self-pay | Admitting: Internal Medicine

## 2018-03-07 NOTE — Telephone Encounter (Signed)
Patient called in asking why sensipar is not being refilled. States she's been taking > 3 years. Med is listed on Hospital Summary from 01/29/2018 as active med. Med was Refused on 02/22/2018 as not appropriate. Patient requesting to know reason. Will forward to PCP for review. Hubbard Hartshorn, RN, BSN

## 2018-03-07 NOTE — Telephone Encounter (Signed)
That was normally prescribed to her by her nephrologist. Please asked the patient to contact her nephrologist.

## 2018-03-11 NOTE — Telephone Encounter (Signed)
Patient notified and she will contact nephrologist. Hubbard Hartshorn, RN, BSN

## 2018-03-13 ENCOUNTER — Ambulatory Visit (INDEPENDENT_AMBULATORY_CARE_PROVIDER_SITE_OTHER): Payer: Self-pay | Admitting: Vascular Surgery

## 2018-03-13 ENCOUNTER — Other Ambulatory Visit: Payer: Self-pay

## 2018-03-13 ENCOUNTER — Ambulatory Visit (HOSPITAL_COMMUNITY)
Admission: RE | Admit: 2018-03-13 | Discharge: 2018-03-13 | Disposition: A | Discharge status: Home / Self Care | Payer: Medicare Other | Source: Ambulatory Visit | Attending: Vascular Surgery | Admitting: Vascular Surgery

## 2018-03-13 ENCOUNTER — Encounter: Payer: Self-pay | Admitting: Vascular Surgery

## 2018-03-13 VITALS — BP 180/81 | HR 66 | Resp 18 | Ht 63.0 in | Wt 160.0 lb

## 2018-03-13 DIAGNOSIS — Z992 Dependence on renal dialysis: Secondary | ICD-10-CM | POA: Insufficient documentation

## 2018-03-13 DIAGNOSIS — Z48812 Encounter for surgical aftercare following surgery on the circulatory system: Secondary | ICD-10-CM | POA: Diagnosis not present

## 2018-03-13 DIAGNOSIS — N186 End stage renal disease: Secondary | ICD-10-CM | POA: Diagnosis not present

## 2018-03-13 DIAGNOSIS — N184 Chronic kidney disease, stage 4 (severe): Secondary | ICD-10-CM

## 2018-03-13 NOTE — Progress Notes (Signed)
Patient name: [name] MRN: [id number] DOB: Dec 17, 1945 Sex: female  REASON FOR VISIT:   Follow-up after left brachiocephalic AV fistula  HPI:   [name] is a pleasant 72 y.o. female who is not yet on dialysis.  She had a left brachiocephalic fistula placed on 01/29/2018.  She returns for a 6-week follow-up visit.  She has no specific complaints.  She denies pain or paresthesias in her left arm.  Current Outpatient Medications  Medication Sig Dispense Refill  . amLODipine-benazepril (LOTREL) 10-20 MG capsule Take 1 capsule by mouth daily. Patient unsure of dose of this medicine    . aspirin 81 MG EC tablet Take 81 mg by mouth daily.      . calcium carbonate (TUMS - DOSED IN MG ELEMENTAL CALCIUM) 500 MG chewable tablet Chew 1 tablet by mouth daily as needed for indigestion or heartburn.    . carvedilol (COREG) 12.5 MG tablet Take 1 tablet (12.5 mg total) 2 (two) times daily with a meal by mouth. 60 tablet 3  . cinacalcet (SENSIPAR) 60 MG tablet Take 60 mg by mouth daily.    . diclofenac sodium (VOLTAREN) 1 % GEL Apply 2 g 4 (four) times daily topically. 100 g 2  . dolutegravir (TIVICAY) 50 MG tablet Take 1 tablet (50 mg total) daily by mouth. 30 tablet 5  . fluticasone (FLONASE) 50 MCG/ACT nasal spray SHAKE LIQUID WELL AND USE 1 SPRAY IN EACH NOSTRIL DAILY (Patient taking differently: Use 1 spray in each nostril daily as needed for allergies) 16 g 0  . FREESTYLE TEST STRIPS test strip Use with glucometer 100 each 9  . furosemide (LASIX) 40 MG tablet Take 2 tablets (80 mg total) daily by mouth. 60 tablet 2  . Insulin Degludec-Liraglutide (XULTOPHY) 100-3.6 UNIT-MG/ML SOPN Inject 16 Units into the skin every morning. 4 pen 4  . Insulin Pen Needle (PEN NEEDLES 31GX5/16") 31G X 8 MM MISC 16 Units by Does not apply route every morning. 300 each 1  . lamivudine (EPIVIR) 100 MG tablet Take 1 tablet (100 mg total) daily by mouth. 30 tablet 5  . loratadine (CLARITIN) 10 MG tablet Take 1  tablet (10 mg total) by mouth daily. 30 tablet 2  . magnesium oxide (MAG-OX) 400 MG tablet Take 400 mg by mouth daily.    . mycophenolate (MYFORTIC) 180 MG EC tablet Take 540 mg by mouth 2 (two) times daily.    Marland Kitchen oxyCODONE-acetaminophen (PERCOCET) 5-325 MG tablet Take 1 tablet by mouth every 6 (six) hours as needed for severe pain. 8 tablet 0  . predniSONE (DELTASONE) 5 MG tablet Take 5 mg by mouth daily with breakfast.    . ranitidine (ZANTAC) 150 MG tablet TAKE 1 TABLET(150 MG) BY MOUTH TWICE DAILY 60 tablet 0  . rilpivirine (EDURANT) 25 MG TABS tablet take 1 tablet by mouth once daily WITH BREAKFAST (Patient taking differently: Take 25 mg by mouth daily with breakfast. ) 30 tablet 5  . tacrolimus (PROGRAF) 1 MG capsule Take 3-4 mg by mouth See admin instructions. Take 4 mg by mouth in the morning and take 3 mg by mouth in the evening    . amLODipine-atorvastatin (CADUET) 10-40 MG tablet Take 1 tablet by mouth daily. (Patient not taking: Reported on 03/13/2018) 30 tablet 11  . Cholecalciferol (VITAMIN D3 PO) Take 1 capsule by mouth daily.     No current facility-administered medications for this visit.     REVIEW OF SYSTEMS:  [X]  denotes positive finding, [ ]   denotes negative finding Cardiac  Comments:  Chest pain or chest pressure:    Shortness of breath upon exertion:    Short of breath when lying flat:    Irregular heart rhythm:    Constitutional    Fever or chills:     PHYSICAL EXAM:   Vitals:   03/13/18 1553 03/13/18 1554  BP: (!) 183/82 (!) 180/81  Pulse: 66   Resp: 18   SpO2: 100%   Weight: 160 lb (72.6 kg)   Height: 5\' 3"  (1.6 m)     GENERAL: The patient is a well-nourished female, in no acute distress. The vital signs are documented above. CARDIOVASCULAR: There is a regular rate and rhythm. PULMONARY: There is good air exchange bilaterally without wheezing or rales. She has an excellent thrill in her left upper arm fistula.  She has a palpable left radial  pulse.  DATA:   DUPLEX AV FISTULA: I have independently interpreted the duplex of her left upper arm fistula.  The diameters of the fistula ranged from 0.55-0.93 cm.  There is some slight narrowing at the area of a possible retained valve in the distal upper arm.  MEDICAL ISSUES:   STATUS POST LEFT BRACHIOCEPHALIC AV FISTULA: The fistula appears to be maturing adequately.  I think he could be used for dialysis in 6 weeks if needed.  Fistula has an excellent thrill and has reasonable size.  If she has any problems with cannulation or the area of slight narrowing progresses then certainly we can consider venoplasty.  Deitra Mayo [facility] [phone]

## 2018-03-20 ENCOUNTER — Ambulatory Visit: Payer: Medicare Other | Admitting: Infectious Disease

## 2018-04-04 ENCOUNTER — Telehealth: Payer: Self-pay | Admitting: Internal Medicine

## 2018-04-13 ENCOUNTER — Other Ambulatory Visit: Payer: Self-pay | Admitting: Infectious Disease

## 2018-04-15 ENCOUNTER — Other Ambulatory Visit: Payer: Self-pay | Admitting: *Deleted

## 2018-04-15 DIAGNOSIS — B20 Human immunodeficiency virus [HIV] disease: Secondary | ICD-10-CM

## 2018-04-15 MED ORDER — LAMIVUDINE 100 MG PO TABS: 100.0000 mg | ORAL_TABLET | Freq: Every day | ORAL | 5 refills | Status: DC

## 2018-04-15 MED ORDER — DOLUTEGRAVIR SODIUM 50 MG PO TABS: 50.0000 mg | ORAL_TABLET | Freq: Every day | ORAL | 5 refills | Status: DC

## 2018-04-15 MED ORDER — RILPIVIRINE HCL 25 MG PO TABS: 25.0000 mg | ORAL_TABLET | Freq: Every day | ORAL | 5 refills | Status: DC

## 2018-05-06 ENCOUNTER — Telehealth: Payer: Self-pay | Admitting: Internal Medicine

## 2018-05-06 ENCOUNTER — Other Ambulatory Visit: Payer: Self-pay | Admitting: Internal Medicine

## 2018-05-06 DIAGNOSIS — I10 Essential (primary) hypertension: Secondary | ICD-10-CM

## 2018-05-06 NOTE — Telephone Encounter (Signed)
Pt requesting a NEW EYE EXAM REFERRAL.  Please advise if you can place a Referral in as she is overdue for her Yearly Diabetic Eye Exam.

## 2018-05-07 ENCOUNTER — Other Ambulatory Visit: Payer: Self-pay | Admitting: Internal Medicine

## 2018-05-07 DIAGNOSIS — L97509 Non-pressure chronic ulcer of other part of unspecified foot with unspecified severity: Principal | ICD-10-CM

## 2018-05-07 DIAGNOSIS — E11621 Type 2 diabetes mellitus with foot ulcer: Secondary | ICD-10-CM

## 2018-05-07 NOTE — Telephone Encounter (Signed)
I just placed an referral.

## 2018-05-08 NOTE — Telephone Encounter (Signed)
Thank you :)

## 2018-05-13 DIAGNOSIS — N2581 Secondary hyperparathyroidism of renal origin: Secondary | ICD-10-CM | POA: Diagnosis not present

## 2018-05-13 DIAGNOSIS — E782 Mixed hyperlipidemia: Secondary | ICD-10-CM | POA: Diagnosis not present

## 2018-05-13 DIAGNOSIS — D631 Anemia in chronic kidney disease: Secondary | ICD-10-CM | POA: Diagnosis not present

## 2018-05-13 DIAGNOSIS — N184 Chronic kidney disease, stage 4 (severe): Secondary | ICD-10-CM | POA: Diagnosis not present

## 2018-05-13 DIAGNOSIS — B20 Human immunodeficiency virus [HIV] disease: Secondary | ICD-10-CM | POA: Diagnosis not present

## 2018-05-13 DIAGNOSIS — E118 Type 2 diabetes mellitus with unspecified complications: Secondary | ICD-10-CM | POA: Diagnosis not present

## 2018-05-13 DIAGNOSIS — I129 Hypertensive chronic kidney disease with stage 1 through stage 4 chronic kidney disease, or unspecified chronic kidney disease: Secondary | ICD-10-CM | POA: Diagnosis not present

## 2018-05-13 DIAGNOSIS — E1122 Type 2 diabetes mellitus with diabetic chronic kidney disease: Secondary | ICD-10-CM | POA: Diagnosis not present

## 2018-05-13 DIAGNOSIS — R319 Hematuria, unspecified: Secondary | ICD-10-CM | POA: Diagnosis not present

## 2018-05-13 DIAGNOSIS — Z940 Kidney transplant status: Secondary | ICD-10-CM | POA: Diagnosis not present

## 2018-05-13 DIAGNOSIS — R809 Proteinuria, unspecified: Secondary | ICD-10-CM | POA: Diagnosis not present

## 2018-05-13 NOTE — Telephone Encounter (Signed)
Attempted to call for geriatrics task force lm for rtc

## 2018-05-13 NOTE — Telephone Encounter (Signed)
-----   Message from Forde Dandy, PharmD sent at 07/13/2017  4:59 PM EDT ----- Regarding: Geriatrics Task Force Patient due for follow up call

## 2018-05-15 ENCOUNTER — Other Ambulatory Visit: Payer: Medicare Other

## 2018-05-15 DIAGNOSIS — Z79899 Other long term (current) drug therapy: Secondary | ICD-10-CM | POA: Diagnosis not present

## 2018-05-15 DIAGNOSIS — Z113 Encounter for screening for infections with a predominantly sexual mode of transmission: Secondary | ICD-10-CM

## 2018-05-15 DIAGNOSIS — B20 Human immunodeficiency virus [HIV] disease: Secondary | ICD-10-CM | POA: Diagnosis not present

## 2018-05-16 LAB — COMPLETE METABOLIC PANEL WITH GFR
AG Ratio: 1.3 (calc) (ref 1.0–2.5)
ALKALINE PHOSPHATASE (APISO): 68 U/L (ref 33–130)
ALT: 9 U/L (ref 6–29)
AST: 15 U/L (ref 10–35)
Albumin: 3.6 g/dL (ref 3.6–5.1)
BUN/Creatinine Ratio: 13 (calc) (ref 6–22)
BUN: 47 mg/dL — ABNORMAL HIGH (ref 7–25)
CO2: 22 mmol/L (ref 20–32)
CREATININE: 3.56 mg/dL — AB (ref 0.50–0.99)
Calcium: 10.4 mg/dL (ref 8.6–10.4)
Chloride: 104 mmol/L (ref 98–110)
GFR, Est African American: 14 mL/min/{1.73_m2} — ABNORMAL LOW (ref >=60)
GFR, Est Non African American: 12 mL/min/{1.73_m2} — ABNORMAL LOW (ref >=60)
GLOBULIN: 2.8 g/dL (ref 1.9–3.7)
Glucose, Bld: 144 mg/dL — ABNORMAL HIGH (ref 65–99)
POTASSIUM: 3.6 mmol/L (ref 3.5–5.3)
SODIUM: 136 mmol/L (ref 135–146)
Total Bilirubin: 0.4 mg/dL (ref 0.2–1.2)
Total Protein: 6.4 g/dL (ref 6.1–8.1)

## 2018-05-16 LAB — CBC WITH DIFFERENTIAL/PLATELET
BASOS ABS: 30 {cells}/uL (ref 0–200)
Basophils Relative: 0.5 %
EOS PCT: 2.5 %
Eosinophils Absolute: 150 cells/uL (ref 15–500)
HEMATOCRIT: 33 % — AB (ref 35.0–45.0)
Hemoglobin: 11.1 g/dL — ABNORMAL LOW (ref 11.7–15.5)
Lymphs Abs: 816 cells/uL — ABNORMAL LOW (ref 850–3900)
MCH: 28.3 pg (ref 27.0–33.0)
MCHC: 33.6 g/dL (ref 32.0–36.0)
MCV: 84.2 fL (ref 80.0–100.0)
MONOS PCT: 11.5 %
MPV: 10.8 fL (ref 7.5–12.5)
NEUTROS PCT: 71.9 %
Neutro Abs: 4314 cells/uL (ref 1500–7800)
Platelets: 161 10*3/uL (ref 140–400)
RBC: 3.92 10*6/uL (ref 3.80–5.10)
RDW: 14.6 % (ref 11.0–15.0)
Total Lymphocyte: 13.6 %
WBC mixed population: 690 cells/uL (ref 200–950)
WBC: 6 10*3/uL (ref 3.8–10.8)

## 2018-05-16 LAB — LIPID PANEL
Cholesterol: 167 mg/dL (ref <200)
HDL: 56 mg/dL (ref >50)
LDL Cholesterol (Calc): 90 mg/dL (calc)
NON-HDL CHOLESTEROL (CALC): 111 mg/dL (ref <130)
TRIGLYCERIDES: 110 mg/dL (ref <150)
Total CHOL/HDL Ratio: 3 (calc) (ref <5.0)

## 2018-05-16 LAB — FLUORESCENT TREPONEMAL AB(FTA)-IGG-BLD: FLUORESCENT TREPONEMAL ABS: REACTIVE — AB

## 2018-05-16 LAB — RPR: RPR Ser Ql: REACTIVE — AB

## 2018-05-16 LAB — T-HELPER CELL (CD4) - (RCID CLINIC ONLY)
CD4 % Helper T Cell: 20 % — ABNORMAL LOW (ref 33–55)
CD4 T Cell Abs: 170 /uL — ABNORMAL LOW (ref 400–2700)

## 2018-05-16 LAB — RPR TITER: RPR Titer: 1:2 {titer} — ABNORMAL HIGH

## 2018-05-17 ENCOUNTER — Other Ambulatory Visit: Payer: Self-pay

## 2018-05-17 ENCOUNTER — Encounter: Payer: Self-pay | Admitting: Internal Medicine

## 2018-05-17 ENCOUNTER — Ambulatory Visit (INDEPENDENT_AMBULATORY_CARE_PROVIDER_SITE_OTHER): Payer: Medicare Other | Admitting: Internal Medicine

## 2018-05-17 VITALS — BP 160/74 | HR 70 | Temp 98.7°F | Ht 63.0 in | Wt 155.4 lb

## 2018-05-17 DIAGNOSIS — Z21 Asymptomatic human immunodeficiency virus [HIV] infection status: Secondary | ICD-10-CM

## 2018-05-17 DIAGNOSIS — Z940 Kidney transplant status: Secondary | ICD-10-CM | POA: Diagnosis not present

## 2018-05-17 DIAGNOSIS — E1129 Type 2 diabetes mellitus with other diabetic kidney complication: Secondary | ICD-10-CM | POA: Diagnosis present

## 2018-05-17 DIAGNOSIS — E1121 Type 2 diabetes mellitus with diabetic nephropathy: Secondary | ICD-10-CM

## 2018-05-17 DIAGNOSIS — Z794 Long term (current) use of insulin: Secondary | ICD-10-CM | POA: Diagnosis not present

## 2018-05-17 DIAGNOSIS — M549 Dorsalgia, unspecified: Secondary | ICD-10-CM | POA: Diagnosis not present

## 2018-05-17 DIAGNOSIS — I10 Essential (primary) hypertension: Secondary | ICD-10-CM | POA: Diagnosis not present

## 2018-05-17 LAB — POCT GLYCOSYLATED HEMOGLOBIN (HGB A1C): Hemoglobin A1C: 7 % — AB (ref 4.0–5.6)

## 2018-05-17 LAB — HIV-1 RNA QUANT-NO REFLEX-BLD
HIV 1 RNA QUANT: NOT DETECTED {copies}/mL
HIV-1 RNA QUANT, LOG: NOT DETECTED {Log_copies}/mL

## 2018-05-17 LAB — GLUCOSE, CAPILLARY: GLUCOSE-CAPILLARY: 167 mg/dL — AB (ref 65–99)

## 2018-05-17 MED ORDER — FREESTYLE LANCETS MISC: 12 refills | Status: AC

## 2018-05-17 MED ORDER — DICLOFENAC SODIUM 1 % TD GEL: 2.0000 g | Freq: Four times a day (QID) | TRANSDERMAL | 2 refills | Status: DC

## 2018-05-17 MED ORDER — INSULIN PEN NEEDLE 32G X 4 MM MISC: 16.0000 [IU] | Freq: Every day | 2 refills | Status: AC

## 2018-05-17 MED ORDER — FREESTYLE TEST VI STRP: ORAL_STRIP | 9 refills | Status: DC

## 2018-05-17 NOTE — Assessment & Plan Note (Signed)
Her A1c today was 7.0, decreased from previous check of 7.7. She did bring her glucometer today and shows marked improvement with the start of Xultophy.  Her average blood sugar was 141, with high of 186 and low of 95.  We congratulated her for her good work and taking care of her blood sugar. Refill for lancet and insulin pen needles were provided. Refill for test strips were provided.  Follow-up in 61-month.

## 2018-05-17 NOTE — Assessment & Plan Note (Signed)
Keep follow-up with nephrology, deteriorating renal function. She had her fistula placed in her left with a good thrill.  According to patient she is compliant with her transplant medication. Had her transplant done in October 2013.  She was advised to continue follow-up with nephrology and be compliant with her medication.

## 2018-05-17 NOTE — Assessment & Plan Note (Signed)
BP Readings from Last 3 Encounters:  05/17/18 (!) 160/74  03/13/18 (!) 180/81  01/29/18 (!) 150/78   Her blood pressure was elevated today.  She never took her Lasix and Coreg this morning.  She was supposed to take Lasix 80 mg twice daily and Coreg 12.5 mg twice daily, she was taking it once a day, skipping Lasix on the days when she has some outdoor plans.  Discuss about her medications in detail. Told her to take her Lasix 80 mg in the morning and a second dose around 2 PM. She should take her Coreg 12.5 mg twice a day. Patient seems understanding. I am not making any adjustment in her medications, once she become compliant with her existing regimen, we will reassess her blood pressure during next follow-up visit and then make necessity changes.

## 2018-05-17 NOTE — Assessment & Plan Note (Signed)
Her CD count done on May 15, 2018 was 170 with undetectable viral load. She has her next follow-up appointment with ID on May 30, 2018. According to patient she does misses her HIV medications quite a bit, having difficulty placing them with her transplant medication and will like to talk with ID provider for any change if possible.  We discussed in detail about the importance of taking her HIV medications regularly. She was also advised to discuss her medications with her ID provider and see if they can help her to stay compliant.

## 2018-05-17 NOTE — Progress Notes (Signed)
   CC: Follow-up of her diabetes and hypertension.  HPI:  [name] is a 72 y.o. with past medical history as listed below came to the clinic for follow-up of her diabetes and hypertension.  She has no complaints today. Please see assessment and plan for her chronic conditions.  Past Medical History:  Diagnosis Date  . Chronic kidney disease    ESRD secondary to DM, started HD in 2008, Dr. Jimmy Footman is her nephrologist, received renal transplant in 2013, no longer on dialysis  . Diabetes mellitus    Las HbA1C   . Dialysis patient [facility])    T Th Sat  . Early cataracts, bilateral   . GERD (gastroesophageal reflux disease)   . Hepatitis C    untreated. VL 3700000 in 2008  . History of hepatitis C 08/04/2015  . HIV infection ([place])    Dx in 1998 in [place], she presented with PCP pneumonia at that time. Has been tried on multiple regimens by her PCP in [place] before./ She has been on current ART for years now. Moved to [facility] in 2008 and is following with Dr. Tommy Medal since then.   . Hypertension   . PONV (postoperative nausea and vomiting)   . Wears glasses    Review of Systems: Negative except mentioned in HPI.  Physical Exam:  Vitals:   05/17/18 1458  BP: (!) 155/64  Pulse: 72  Temp: 98.7 F (37.1 C)  TempSrc: Oral  SpO2: 100%  Weight: 155 lb 6.4 oz (70.5 kg)  Height: 5\' 3"  (1.6 m)    General: Vital signs reviewed.  Patient is well-developed and well-nourished, in no acute distress and cooperative with exam.  Head: Normocephalic and atraumatic. Eyes: EOMI, conjunctivae normal, no scleral icterus.  Cardiovascular: RRR, S1 normal, S2 normal, no murmurs, gallops, or rubs. Pulmonary/Chest: Clear to auscultation bilaterally, no wheezes, rales, or rhonchi. Abdominal: Soft, non-tender, non-distended, BS +,  Extremities: Left mid arm fistula with good thrill, no lower extremity edema bilaterally,  pulses symmetric and intact bilaterally. No cyanosis or clubbing. Neurological: A&O  x3, Strength is normal and symmetric bilaterally, cranial nerve II-XII are grossly intact, no focal motor deficit, sensory intact to light touch bilaterally.  Skin: Warm, dry and intact. No rashes or erythema. Psychiatric: Normal mood and affect. speech and behavior is normal. Cognition and memory are normal.  Assessment & Plan:   See Encounters Tab for problem based charting.  Patient discussed with Dr. Rebeca Alert

## 2018-05-17 NOTE — Patient Instructions (Signed)
Thank you for visiting clinic today. You are doing very well with your diabetes, keep up the good work. Your blood pressure was high, as you has not taken your Coreg and Lasix today, I am not making any adjustment in your blood pressure medicine.  Please take your amlodipine every day in the morning, Lasix 80 mg twice a day 1 in the morning and 1 around 2 PM.  Take Coreg twice a day 1 tablet of 12.5 mg in the morning and 1 at evening. Please take your HIV medications regularly as your CD count is still low which can predispose you to multiple infections.  Keep following up with infectious disease. Continue checking your blood sugar as you are doing it now and bring your glucometer with your next follow-up appointment in 57-month.

## 2018-05-24 NOTE — Progress Notes (Signed)
Internal Medicine Clinic Attending  Case discussed with Dr. Reesa Chew  at the time of the visit.  We reviewed the resident's history and exam and pertinent patient test results.  I agree with the assessment, diagnosis, and plan of care documented in the resident's note.  Oda Kilts, MD

## 2018-05-28 DIAGNOSIS — N184 Chronic kidney disease, stage 4 (severe): Secondary | ICD-10-CM | POA: Diagnosis not present

## 2018-05-30 ENCOUNTER — Ambulatory Visit (INDEPENDENT_AMBULATORY_CARE_PROVIDER_SITE_OTHER): Payer: Medicare Other | Admitting: Infectious Disease

## 2018-05-30 ENCOUNTER — Encounter: Payer: Self-pay | Admitting: Infectious Disease

## 2018-05-30 VITALS — BP 165/94 | HR 80 | Temp 98.2°F | Ht 63.0 in | Wt 152.0 lb

## 2018-05-30 DIAGNOSIS — B20 Human immunodeficiency virus [HIV] disease: Secondary | ICD-10-CM

## 2018-05-30 DIAGNOSIS — I10 Essential (primary) hypertension: Secondary | ICD-10-CM

## 2018-05-30 DIAGNOSIS — B182 Chronic viral hepatitis C: Secondary | ICD-10-CM

## 2018-05-30 DIAGNOSIS — K219 Gastro-esophageal reflux disease without esophagitis: Secondary | ICD-10-CM | POA: Diagnosis not present

## 2018-05-30 DIAGNOSIS — Z940 Kidney transplant status: Secondary | ICD-10-CM

## 2018-05-30 NOTE — Progress Notes (Signed)
Chief complaint: No chief complaints today.  Subjective:    Patient ID: [name], female    DOB: 04-12-1946, 72 y.o.   MRN: [id number]  HPI  72 year-old woman with past medical history is significant for HIV infection, hypertension, diabetes mellitus type II, sp renal transplant at [facility]. She has maintained perfect virological suppression though she has had worsening renal fx that has caused use to readjust her ARVs.  We eventually opted to change her to Tivicay, Rilpivirine and lamivudine.   She has done extremely well with her virological suppression but in prior months admitted to missing 50% of her meds due to falling asleep at night.  Surprisingly her VL was s<20 after this  I told her that this level of adherence was recipe for certain virological failure over the long run.  A in reviewing her medications it turns out that she has been on high-dose ranitidine for the past 6 months to a year which she absolutely should not of been on due to the fact that ranitidine and omeprazole because reduced levels of ropivacaine.  I also explicitly put these drugs into her allergies and is contraindicated medications but despite this she has been it prescribed H2 blocking agents by her primary care.  Fortunately for Dayne the dolutegravir that she is taking likely protected her thumb failing with resistance which would have assuredly happened if she was for example on something like ODEFSEY.  Plan on changing her to a 2 drug regimen in the form of TIVICAY and ranitidine.  I would like to do the single tablet regimen DOVATA  but this is not yet covered by her insurance.  Bring her back in 1 month's time to make sure she is suppressed on 2 drug regimen she is effectively been on a 2 drug regimen due to the fact that she has been prescribed high-dose H2 blockers.  Past Medical History:  Diagnosis Date  . Chronic kidney disease    ESRD secondary to DM, started HD in 2008, Dr. Jimmy Footman is  her nephrologist, received renal transplant in 2013, no longer on dialysis  . Diabetes mellitus    Las HbA1C   . Dialysis patient [facility])    T Th Sat  . Early cataracts, bilateral   . GERD (gastroesophageal reflux disease)   . Hepatitis C    untreated. VL 3700000 in 2008  . History of hepatitis C 08/04/2015  . HIV infection ([place])    Dx in 1998 in [place], she presented with PCP pneumonia at that time. Has been tried on multiple regimens by her PCP in [place] before./ She has been on current ART for years now. Moved to [facility] in 2008 and is following with Dr. Tommy [name] since then.   . Hypertension   . PONV (postoperative nausea and vomiting)   . Wears glasses     Past Surgical History:  Procedure Laterality Date  . ABDOMINAL HYSTERECTOMY     and oopherectomy for fibroids  . BASCILIC VEIN TRANSPOSITION Left 01/29/2018   Procedure: RIGHT BRACIOCEPHALIC AV FISTULA;  Surgeon: Angelia Mould, MD;  Location: [place];  Service: Vascular;  Laterality: Left;  . CHOLECYSTECTOMY    . KIDNEY TRANSPLANT      Family History  Problem Relation Age of Onset  . Diabetes Mother   . Cancer Father   . Cancer Brother       Social History   Socioeconomic History  . Marital status: Single    Spouse name: Not  on file  . Number of children: Not on file  . Years of education: Not on file  . Highest education level: Not on file  Occupational History  . Not on file  Social Needs  . Financial resource strain: Not on file  . Food insecurity:    Worry: Not on file    Inability: Not on file  . Transportation needs:    Medical: Not on file    Non-medical: Not on file  Tobacco Use  . Smoking status: Former Smoker    Types: Cigarettes    Last attempt to quit: 05/22/1997    Years since quitting: 21.0  . Smokeless tobacco: Never Used  Substance and Sexual Activity  . Alcohol use: No    Alcohol/week: 0.0 oz  . Drug use: No  . Sexual activity: Not on file  Lifestyle  . Physical activity:    Days per week:  Not on file    Minutes per session: Not on file  . Stress: Not on file  Relationships  . Social connections:    Talks on phone: Not on file    Gets together: Not on file    Attends religious service: Not on file    Active member of club or organization: Not on file    Attends meetings of clubs or organizations: Not on file    Relationship status: Not on file  Other Topics Concern  . Not on file  Social History Narrative  . Not on file    Allergies  Allergen Reactions  . Omeprazole Other (See Comments)    Interferes with the absorption of rilipivirine  . Influenza Virus Vacc Split Pf Other (See Comments)    UNSPECIFIED REACTION  Alleged severe illness in [place]. Do not have documentation from [place] but pt always refuses vaccine on these grounds  . Influenza Vaccines Other (See Comments)    Other reaction(s): Hallucination     Current Outpatient Medications:  .  amLODipine (NORVASC) 10 MG tablet, Take 10 mg by mouth daily., Disp: , Rfl: 5 .  aspirin 81 MG EC tablet, Take 81 mg by mouth daily.  , Disp: , Rfl:  .  calcitRIOL (ROCALTROL) 0.25 MCG capsule, Take 0.25 mcg by mouth daily., Disp: , Rfl: 6 .  calcium carbonate (TUMS - DOSED IN MG ELEMENTAL CALCIUM) 500 MG chewable tablet, Chew 1 tablet by mouth daily as needed for indigestion or heartburn., Disp: , Rfl:  .  carvedilol (COREG) 12.5 MG tablet, Take 1 tablet (12.5 mg total) 2 (two) times daily with a meal by mouth., Disp: 60 tablet, Rfl: 3 .  Cholecalciferol (VITAMIN D3 PO), Take 1 capsule by mouth daily., Disp: , Rfl:  .  cinacalcet (SENSIPAR) 60 MG tablet, Take 60 mg by mouth daily., Disp: , Rfl:  .  diclofenac sodium (VOLTAREN) 1 % GEL, Apply 2 g topically 4 (four) times daily., Disp: 100 g, Rfl: 2 .  dolutegravir (TIVICAY) 50 MG tablet, Take 1 tablet (50 mg total) by mouth daily., Disp: 30 tablet, Rfl: 5 .  fluticasone (FLONASE) 50 MCG/ACT nasal spray, SHAKE LIQUID WELL AND USE 1 SPRAY IN EACH NOSTRIL DAILY (Patient  taking differently: Use 1 spray in each nostril daily as needed for allergies), Disp: 16 g, Rfl: 0 .  FREESTYLE TEST STRIPS test strip, Use with glucometer, Disp: 100 each, Rfl: 9 .  furosemide (LASIX) 80 MG tablet, Take 80 mg by mouth 2 (two) times daily., Disp: , Rfl: 6 .  Insulin Degludec-Liraglutide (  XULTOPHY) 100-3.6 UNIT-MG/ML SOPN, Inject 16 Units into the skin every morning., Disp: 4 pen, Rfl: 4 .  Insulin Pen Needle 32G X 4 MM MISC, 16 Units by Does not apply route daily., Disp: 100 each, Rfl: 2 .  lamivudine (EPIVIR) 100 MG tablet, Take 1 tablet (100 mg total) by mouth daily., Disp: 30 tablet, Rfl: 5 .  Lancets (FREESTYLE) lancets, Use as instructed, Disp: 100 each, Rfl: 12 .  loratadine (CLARITIN) 10 MG tablet, Take 1 tablet (10 mg total) by mouth daily., Disp: 30 tablet, Rfl: 2 .  magnesium oxide (MAG-OX) 400 MG tablet, Take 400 mg by mouth daily., Disp: , Rfl:  .  mycophenolate (MYFORTIC) 180 MG EC tablet, Take 540 mg by mouth 2 (two) times daily., Disp: , Rfl:  .  oxyCODONE-acetaminophen (PERCOCET) 5-325 MG tablet, Take 1 tablet by mouth every 6 (six) hours as needed for severe pain., Disp: 8 tablet, Rfl: 0 .  potassium chloride SA (K-DUR,KLOR-CON) 20 MEQ tablet, Take 20 mEq by mouth daily., Disp: , Rfl: 10 .  predniSONE (DELTASONE) 5 MG tablet, Take 5 mg by mouth daily with breakfast., Disp: , Rfl:  .  ranitidine (ZANTAC) 150 MG tablet, TAKE 1 TABLET(150 MG) BY MOUTH TWICE DAILY, Disp: 60 tablet, Rfl: 0 .  rilpivirine (EDURANT) 25 MG TABS tablet, Take 1 tablet (25 mg total) by mouth daily with breakfast., Disp: 30 tablet, Rfl: 5 .  rosuvastatin (CRESTOR) 20 MG tablet, Take 20 mg by mouth daily., Disp: , Rfl: 3 .  tacrolimus (PROGRAF) 1 MG capsule, Take 3-4 mg by mouth See admin instructions. Take 4 mg by mouth in the morning and take 3 mg by mouth in the evening, Disp: , Rfl:      Review of Systems  Constitutional: Negative for activity change, appetite change, chills,  diaphoresis, fatigue, fever and unexpected weight change.  HENT: Negative for congestion, rhinorrhea, sinus pressure, sneezing, sore throat and trouble swallowing.   Eyes: Negative for photophobia and visual disturbance.  Respiratory: Negative for cough, chest tightness, shortness of breath, wheezing and stridor.   Cardiovascular: Negative for chest pain, palpitations and leg swelling.  Gastrointestinal: Negative for abdominal distention, anal bleeding, blood in stool, constipation, diarrhea, nausea and vomiting.  Genitourinary: Negative for difficulty urinating, dysuria and hematuria.  Musculoskeletal: Negative for back pain, gait problem, joint swelling and myalgias.  Skin: Negative for color change, pallor, rash and wound.  Neurological: Negative for dizziness, tremors, weakness and light-headedness.  Hematological: Negative for adenopathy. Does not bruise/bleed easily.  Psychiatric/Behavioral: Positive for sleep disturbance. Negative for agitation, behavioral problems, confusion, decreased concentration and dysphoric mood.   + for indigestion     Objective:   Physical Exam  Constitutional: She is oriented to person, place, and time. She appears well-developed and well-nourished. No distress.  HENT:  Head: Normocephalic and atraumatic.  Mouth/Throat: Oropharynx is clear and moist. No oropharyngeal exudate.  Eyes: Conjunctivae and EOM are normal. No scleral icterus.  Neck: Normal range of motion. Neck supple. No JVD present.  Cardiovascular: Normal rate, regular rhythm and normal heart sounds.  Pulmonary/Chest: Effort normal. No respiratory distress. She has no wheezes.  Abdominal: She exhibits no distension.  Musculoskeletal: She exhibits no edema or tenderness.  Lymphadenopathy:    She has no cervical adenopathy.  Neurological: She is alert and oriented to person, place, and time. Coordination normal.  Skin: Skin is warm and dry. She is not diaphoretic. No erythema. No pallor.   Psychiatric: She has a normal mood and  affect. Her behavior is normal. Judgment and thought content normal.          Assessment & Plan:   HIV: Certainly very dangerous that her primary care physician placed her on an H2 blocker while she was on a drug, namely rilpivirine that requires acid to be properly absorbed and to obtain therapeutic levels.  Fortunately she was not on something like ODEFSEY because she would have failed with resistance to ropivacaine and likely to nucleotide backbone.  Fortunately she was on dolutegravir and I am confident that they take a very is what protected her from failing from resistance.  Since she is essentially already only on 2 drugs due to the H2-blocker interaction we will place her on dolutegravir lamivudine which is currently in a approved regimen for patients.  In some ways I would prefer to have her on a 3 drug regimen but we run into issues such as having her on abacavir and concerns about abacavir and relationship to cardiovascular toxicity.  I think that tenofovir in the form of TAF would likely be safe in her but given the fact that she is with chronic kidney disease and again likely approaching dialysis I would like to remove this from anybody's severe of concern.  If she ends up going back on dialysis I would be quite comfortable placing her on a single tablet regimen such as BIKTARVY that would have 3 drugs including TAF  DM: being followed by Endocrine, and PCP    HTN: BP again not at goal, and likely one of the drivers of her worsening renal fxn There were no vitals filed for this visit.   There were no vitals filed for this visit.  I spent greater than 40  minutes with the patient including greater than 50% of time in face to face counsel of the patient reinforcing need for high adherence to medications explaining reasons why she was being prescribed will periphery and why she was being asked not to take H2 blockers or proton pump inhibitors and  how the fact that they were prescribed interfered with 1 of her drugs and could have compromised her regimen in coordination of her care.    [name]. [name], [place]

## 2018-06-07 DIAGNOSIS — Z1231 Encounter for screening mammogram for malignant neoplasm of breast: Secondary | ICD-10-CM | POA: Diagnosis not present

## 2018-06-07 DIAGNOSIS — Z6828 Body mass index (BMI) 28.0-28.9, adult: Secondary | ICD-10-CM | POA: Diagnosis not present

## 2018-06-07 DIAGNOSIS — Z01419 Encounter for gynecological examination (general) (routine) without abnormal findings: Secondary | ICD-10-CM | POA: Diagnosis not present

## 2018-06-10 DIAGNOSIS — E119 Type 2 diabetes mellitus without complications: Secondary | ICD-10-CM | POA: Diagnosis not present

## 2018-06-10 DIAGNOSIS — H25813 Combined forms of age-related cataract, bilateral: Secondary | ICD-10-CM | POA: Diagnosis not present

## 2018-06-10 DIAGNOSIS — H04123 Dry eye syndrome of bilateral lacrimal glands: Secondary | ICD-10-CM | POA: Diagnosis not present

## 2018-06-10 LAB — HM DIABETES EYE EXAM

## 2018-06-17 ENCOUNTER — Other Ambulatory Visit: Payer: Self-pay

## 2018-06-17 ENCOUNTER — Ambulatory Visit (INDEPENDENT_AMBULATORY_CARE_PROVIDER_SITE_OTHER): Payer: Medicare Other | Admitting: Internal Medicine

## 2018-06-17 ENCOUNTER — Observation Stay (HOSPITAL_COMMUNITY): Payer: Medicare Other

## 2018-06-17 ENCOUNTER — Observation Stay (HOSPITAL_COMMUNITY)
Admission: AD | Admit: 2018-06-17 | Discharge: 2018-06-18 | Disposition: A | Discharge status: Home / Self Care | Payer: Medicare Other | Source: Ambulatory Visit | Attending: Internal Medicine | Admitting: Internal Medicine

## 2018-06-17 ENCOUNTER — Ambulatory Visit (HOSPITAL_COMMUNITY)
Admission: RE | Admit: 2018-06-17 | Discharge: 2018-06-17 | Disposition: A | Discharge status: Home / Self Care | Payer: Medicare Other | Source: Ambulatory Visit | Attending: Family Medicine | Admitting: Family Medicine

## 2018-06-17 ENCOUNTER — Encounter (HOSPITAL_COMMUNITY): Payer: Self-pay | Admitting: General Practice

## 2018-06-17 VITALS — BP 156/66 | HR 70 | Temp 98.6°F | Ht 63.0 in | Wt 154.7 lb

## 2018-06-17 DIAGNOSIS — I132 Hypertensive heart and chronic kidney disease with heart failure and with stage 5 chronic kidney disease, or end stage renal disease: Secondary | ICD-10-CM | POA: Diagnosis not present

## 2018-06-17 DIAGNOSIS — B1920 Unspecified viral hepatitis C without hepatic coma: Secondary | ICD-10-CM | POA: Insufficient documentation

## 2018-06-17 DIAGNOSIS — B20 Human immunodeficiency virus [HIV] disease: Secondary | ICD-10-CM | POA: Insufficient documentation

## 2018-06-17 DIAGNOSIS — I5030 Unspecified diastolic (congestive) heart failure: Secondary | ICD-10-CM | POA: Diagnosis not present

## 2018-06-17 DIAGNOSIS — Z8249 Family history of ischemic heart disease and other diseases of the circulatory system: Secondary | ICD-10-CM | POA: Diagnosis not present

## 2018-06-17 DIAGNOSIS — Z87891 Personal history of nicotine dependence: Secondary | ICD-10-CM | POA: Diagnosis not present

## 2018-06-17 DIAGNOSIS — R079 Chest pain, unspecified: Secondary | ICD-10-CM | POA: Diagnosis not present

## 2018-06-17 DIAGNOSIS — R0789 Other chest pain: Secondary | ICD-10-CM | POA: Diagnosis present

## 2018-06-17 DIAGNOSIS — K219 Gastro-esophageal reflux disease without esophagitis: Secondary | ICD-10-CM | POA: Insufficient documentation

## 2018-06-17 DIAGNOSIS — H269 Unspecified cataract: Secondary | ICD-10-CM | POA: Diagnosis not present

## 2018-06-17 DIAGNOSIS — N186 End stage renal disease: Secondary | ICD-10-CM | POA: Insufficient documentation

## 2018-06-17 DIAGNOSIS — E1122 Type 2 diabetes mellitus with diabetic chronic kidney disease: Secondary | ICD-10-CM | POA: Insufficient documentation

## 2018-06-17 DIAGNOSIS — Z992 Dependence on renal dialysis: Secondary | ICD-10-CM | POA: Insufficient documentation

## 2018-06-17 DIAGNOSIS — I420 Dilated cardiomyopathy: Secondary | ICD-10-CM | POA: Insufficient documentation

## 2018-06-17 DIAGNOSIS — Z940 Kidney transplant status: Secondary | ICD-10-CM | POA: Insufficient documentation

## 2018-06-17 DIAGNOSIS — R059 Cough, unspecified: Secondary | ICD-10-CM

## 2018-06-17 DIAGNOSIS — Z833 Family history of diabetes mellitus: Secondary | ICD-10-CM | POA: Diagnosis not present

## 2018-06-17 DIAGNOSIS — Z888 Allergy status to other drugs, medicaments and biological substances status: Secondary | ICD-10-CM | POA: Insufficient documentation

## 2018-06-17 DIAGNOSIS — Z79899 Other long term (current) drug therapy: Secondary | ICD-10-CM | POA: Diagnosis not present

## 2018-06-17 DIAGNOSIS — R05 Cough: Secondary | ICD-10-CM

## 2018-06-17 DIAGNOSIS — Z66 Do not resuscitate: Secondary | ICD-10-CM | POA: Diagnosis not present

## 2018-06-17 DIAGNOSIS — Z887 Allergy status to serum and vaccine status: Secondary | ICD-10-CM | POA: Diagnosis not present

## 2018-06-17 DIAGNOSIS — Z9889 Other specified postprocedural states: Secondary | ICD-10-CM | POA: Diagnosis not present

## 2018-06-17 DIAGNOSIS — Z7982 Long term (current) use of aspirin: Secondary | ICD-10-CM | POA: Insufficient documentation

## 2018-06-17 HISTORY — DX: Heart failure, unspecified: I50.9

## 2018-06-17 HISTORY — DX: Personal history of other medical treatment: Z92.89

## 2018-06-17 HISTORY — DX: Pure hypercholesterolemia, unspecified: E78.00

## 2018-06-17 HISTORY — DX: Pneumocystosis: B59

## 2018-06-17 HISTORY — DX: Type 2 diabetes mellitus without complications: E11.9

## 2018-06-17 LAB — CBC WITH DIFFERENTIAL/PLATELET
Abs Immature Granulocytes: 0 10*3/uL (ref 0.0–0.1)
Abs Immature Granulocytes: 0 10*3/uL (ref 0.0–0.1)
BASOS ABS: 0 10*3/uL (ref 0.0–0.1)
BASOS PCT: 0 %
BASOS PCT: 1 %
Basophils Absolute: 0 10*3/uL (ref 0.0–0.1)
EOS ABS: 0.1 10*3/uL (ref 0.0–0.7)
EOS PCT: 2 %
Eosinophils Absolute: 0.1 10*3/uL (ref 0.0–0.7)
Eosinophils Relative: 2 %
HCT: 34.4 % — ABNORMAL LOW (ref 36.0–46.0)
HCT: 36.4 % (ref 36.0–46.0)
Hemoglobin: 11.3 g/dL — ABNORMAL LOW (ref 12.0–15.0)
Hemoglobin: 11.7 g/dL — ABNORMAL LOW (ref 12.0–15.0)
IMMATURE GRANULOCYTES: 1 %
Immature Granulocytes: 1 %
LYMPHS PCT: 11 %
Lymphocytes Relative: 9 %
Lymphs Abs: 0.6 10*3/uL — ABNORMAL LOW (ref 0.7–4.0)
Lymphs Abs: 0.7 10*3/uL (ref 0.7–4.0)
MCH: 28.3 pg (ref 26.0–34.0)
MCH: 27.8 pg (ref 26.0–34.0)
MCHC: 32.8 g/dL (ref 30.0–36.0)
MCHC: 32.1 g/dL (ref 30.0–36.0)
MCV: 86.2 fL (ref 78.0–100.0)
MCV: 86.5 fL (ref 78.0–100.0)
MONO ABS: 0.5 10*3/uL (ref 0.1–1.0)
Monocytes Absolute: 0.5 10*3/uL (ref 0.1–1.0)
Monocytes Relative: 7 %
Monocytes Relative: 7 %
NEUTROS ABS: 5.1 10*3/uL (ref 1.7–7.7)
NEUTROS PCT: 81 %
Neutro Abs: 5.5 10*3/uL (ref 1.7–7.7)
Neutrophils Relative %: 78 %
PLATELETS: 172 10*3/uL (ref 150–400)
PLATELETS: 183 10*3/uL (ref 150–400)
RBC: 3.99 MIL/uL (ref 3.87–5.11)
RBC: 4.21 MIL/uL (ref 3.87–5.11)
RDW: 13.4 % (ref 11.5–15.5)
RDW: 13.6 % (ref 11.5–15.5)
WBC: 6.6 10*3/uL (ref 4.0–10.5)
WBC: 6.5 10*3/uL (ref 4.0–10.5)

## 2018-06-17 LAB — COMPREHENSIVE METABOLIC PANEL
ALK PHOS: 74 U/L (ref 38–126)
ALT: 23 U/L (ref 0–44)
AST: 21 U/L (ref 15–41)
Albumin: 3.6 g/dL (ref 3.5–5.0)
Anion gap: 11 (ref 5–15)
BUN: 63 mg/dL — ABNORMAL HIGH (ref 8–23)
CALCIUM: 8.7 mg/dL — AB (ref 8.9–10.3)
CO2: 20 mmol/L — AB (ref 22–32)
CREATININE: 4.36 mg/dL — AB (ref 0.44–1.00)
Chloride: 106 mmol/L (ref 98–111)
GFR calc non Af Amer: 9 mL/min — ABNORMAL LOW (ref >60)
GFR, EST AFRICAN AMERICAN: 11 mL/min — AB (ref >60)
Glucose, Bld: 212 mg/dL — ABNORMAL HIGH (ref 70–99)
Potassium: 4.3 mmol/L (ref 3.5–5.1)
SODIUM: 137 mmol/L (ref 135–145)
Total Bilirubin: 0.6 mg/dL (ref 0.3–1.2)
Total Protein: 7.2 g/dL (ref 6.5–8.1)

## 2018-06-17 LAB — TROPONIN I
Troponin I: <0.03 ng/mL (ref <0.03)
Troponin I: <0.03 ng/mL (ref <0.03)

## 2018-06-17 LAB — HEMOGLOBIN A1C
Hgb A1c MFr Bld: 6.9 % — ABNORMAL HIGH (ref 4.8–5.6)
MEAN PLASMA GLUCOSE: 151.33 mg/dL

## 2018-06-17 LAB — GLUCOSE, CAPILLARY: GLUCOSE-CAPILLARY: 205 mg/dL — AB (ref 70–99)

## 2018-06-17 LAB — MAGNESIUM: MAGNESIUM: 1.8 mg/dL (ref 1.7–2.4)

## 2018-06-17 LAB — BRAIN NATRIURETIC PEPTIDE: B NATRIURETIC PEPTIDE 5: 242.9 pg/mL — AB (ref 0.0–100.0)

## 2018-06-17 LAB — TSH: TSH: 1.194 u[IU]/mL (ref 0.350–4.500)

## 2018-06-17 LAB — PROTIME-INR
INR: 1.07
PROTHROMBIN TIME: 13.8 s (ref 11.4–15.2)

## 2018-06-17 MED ORDER — LAMIVUDINE 10 MG/ML PO SOLN
100.0000 mg | Freq: Every day | ORAL | Status: DC
  Administered 2018-06-18: 100 mg via ORAL
  Filled 2018-06-17: qty 10

## 2018-06-17 MED ORDER — ASPIRIN EC 81 MG PO TBEC: 81.0000 mg | DELAYED_RELEASE_TABLET | Freq: Every day | ORAL | Status: DC

## 2018-06-17 MED ORDER — FAMOTIDINE 20 MG PO TABS
20.0000 mg | ORAL_TABLET | Freq: Every day | ORAL | Status: DC
  Administered 2018-06-18: 20 mg via ORAL
  Filled 2018-06-17: qty 1

## 2018-06-17 MED ORDER — LAMIVUDINE 100 MG PO TABS: 100.0000 mg | ORAL_TABLET | Freq: Every day | ORAL | Status: DC

## 2018-06-17 MED ORDER — AMLODIPINE BESYLATE 10 MG PO TABS
10.0000 mg | ORAL_TABLET | Freq: Every day | ORAL | Status: DC
  Administered 2018-06-18: 10 mg via ORAL
  Filled 2018-06-17: qty 1

## 2018-06-17 MED ORDER — ASPIRIN EC 81 MG PO TBEC
81.0000 mg | DELAYED_RELEASE_TABLET | Freq: Every day | ORAL | Status: DC
  Administered 2018-06-17 – 2018-06-18 (×2): 81 mg via ORAL
  Filled 2018-06-17 (×2): qty 1

## 2018-06-17 MED ORDER — LORATADINE 10 MG PO TABS
10.0000 mg | ORAL_TABLET | Freq: Every day | ORAL | Status: DC
  Administered 2018-06-18: 10 mg via ORAL
  Filled 2018-06-17: qty 1

## 2018-06-17 MED ORDER — DOLUTEGRAVIR SODIUM 50 MG PO TABS
50.0000 mg | ORAL_TABLET | Freq: Every day | ORAL | Status: DC
  Administered 2018-06-18: 50 mg via ORAL
  Filled 2018-06-17: qty 1

## 2018-06-17 MED ORDER — ROSUVASTATIN CALCIUM 10 MG PO TABS
20.0000 mg | ORAL_TABLET | Freq: Every day | ORAL | Status: DC
  Administered 2018-06-18: 20 mg via ORAL
  Filled 2018-06-17: qty 2

## 2018-06-17 MED ORDER — ACETAMINOPHEN 325 MG PO TABS: 650.0000 mg | ORAL_TABLET | Freq: Four times a day (QID) | ORAL | Status: DC | PRN

## 2018-06-17 MED ORDER — SENNOSIDES-DOCUSATE SODIUM 8.6-50 MG PO TABS: 1.0000 | ORAL_TABLET | Freq: Every evening | ORAL | Status: DC | PRN

## 2018-06-17 MED ORDER — CINACALCET HCL 30 MG PO TABS
60.0000 mg | ORAL_TABLET | Freq: Every day | ORAL | Status: DC
  Administered 2018-06-18: 60 mg via ORAL
  Filled 2018-06-17: qty 2

## 2018-06-17 MED ORDER — MYCOPHENOLATE SODIUM 180 MG PO TBEC
540.0000 mg | DELAYED_RELEASE_TABLET | Freq: Two times a day (BID) | ORAL | Status: DC
  Administered 2018-06-17 – 2018-06-18 (×2): 540 mg via ORAL
  Filled 2018-06-17 (×3): qty 3

## 2018-06-17 MED ORDER — CARVEDILOL 12.5 MG PO TABS
12.5000 mg | ORAL_TABLET | Freq: Two times a day (BID) | ORAL | Status: DC
  Administered 2018-06-18: 12.5 mg via ORAL
  Filled 2018-06-17: qty 1

## 2018-06-17 MED ORDER — MAGNESIUM OXIDE 400 (241.3 MG) MG PO TABS
400.0000 mg | ORAL_TABLET | Freq: Every day | ORAL | Status: DC
  Administered 2018-06-18: 400 mg via ORAL
  Filled 2018-06-17: qty 1

## 2018-06-17 MED ORDER — TACROLIMUS 1 MG PO CAPS
4.0000 mg | ORAL_CAPSULE | Freq: Every day | ORAL | Status: DC
  Administered 2018-06-18: 4 mg via ORAL
  Filled 2018-06-17 (×2): qty 4

## 2018-06-17 MED ORDER — INSULIN ASPART 100 UNIT/ML ~~LOC~~ SOLN
0.0000 [IU] | Freq: Every day | SUBCUTANEOUS | Status: DC
  Administered 2018-06-17: 2 [IU] via SUBCUTANEOUS

## 2018-06-17 MED ORDER — HEPARIN SODIUM (PORCINE) 5000 UNIT/ML IJ SOLN
5000.0000 [IU] | Freq: Three times a day (TID) | INTRAMUSCULAR | Status: DC
  Administered 2018-06-17: 5000 [IU] via SUBCUTANEOUS
  Filled 2018-06-17 (×2): qty 1

## 2018-06-17 MED ORDER — INSULIN GLARGINE 100 UNIT/ML ~~LOC~~ SOLN
16.0000 [IU] | Freq: Every day | SUBCUTANEOUS | Status: DC
  Administered 2018-06-18: 16 [IU] via SUBCUTANEOUS
  Filled 2018-06-17: qty 0.16

## 2018-06-17 MED ORDER — FUROSEMIDE 80 MG PO TABS
80.0000 mg | ORAL_TABLET | Freq: Two times a day (BID) | ORAL | Status: DC
  Administered 2018-06-17 – 2018-06-18 (×2): 80 mg via ORAL
  Filled 2018-06-17 (×2): qty 1

## 2018-06-17 MED ORDER — INSULIN DEGLUDEC-LIRAGLUTIDE 100-3.6 UNIT-MG/ML ~~LOC~~ SOPN: 16.0000 [IU] | PEN_INJECTOR | Freq: Every morning | SUBCUTANEOUS | Status: DC

## 2018-06-17 MED ORDER — INSULIN ASPART 100 UNIT/ML ~~LOC~~ SOLN
0.0000 [IU] | Freq: Three times a day (TID) | SUBCUTANEOUS | Status: DC
  Administered 2018-06-18: 3 [IU] via SUBCUTANEOUS

## 2018-06-17 MED ORDER — CALCITRIOL 0.25 MCG PO CAPS
0.2500 ug | ORAL_CAPSULE | Freq: Every day | ORAL | Status: DC
  Administered 2018-06-18: 0.25 ug via ORAL
  Filled 2018-06-17: qty 1

## 2018-06-17 MED ORDER — SODIUM CHLORIDE 0.9% FLUSH
3.0000 mL | Freq: Two times a day (BID) | INTRAVENOUS | Status: DC
  Administered 2018-06-17 – 2018-06-18 (×2): 3 mL via INTRAVENOUS

## 2018-06-17 MED ORDER — PREDNISONE 5 MG PO TABS
5.0000 mg | ORAL_TABLET | Freq: Every day | ORAL | Status: DC
  Administered 2018-06-18: 5 mg via ORAL
  Filled 2018-06-17: qty 1

## 2018-06-17 MED ORDER — ACETAMINOPHEN 650 MG RE SUPP
650.0000 mg | Freq: Four times a day (QID) | RECTAL | Status: DC | PRN
Start: 2018-06-17 — End: 2018-06-18

## 2018-06-17 MED ORDER — FLUTICASONE PROPIONATE 50 MCG/ACT NA SUSP
1.0000 | Freq: Every day | NASAL | Status: DC | PRN
  Filled 2018-06-17: qty 16

## 2018-06-17 MED ORDER — TACROLIMUS 1 MG PO CAPS
3.0000 mg | ORAL_CAPSULE | Freq: Every day | ORAL | Status: DC
  Administered 2018-06-17: 3 mg via ORAL
  Filled 2018-06-17: qty 3

## 2018-06-17 NOTE — Assessment & Plan Note (Addendum)
Assessment:  72 y/o AA female with HTN, HLD, DM2, ESRD s/p kidney transplant on immunosuppression and HIV who presents for evaluation of chest pressure and chest pain. Symptoms started about 2 weeks ago but worsened over the past week. Notices chest pressure upon awakening and with ambulation. Also notes chest pain (sharp) with coughing or deep inspiration. Admits to DOE which started today.   Also with 2-week history of night sweats and low-grade fever 1 week ago. Notes she had a very similar episode due to PCP pneumonia years ago, but otherwise denies prior history. She denies increased cough or sputum production and has been "better" at taking her anti-retrovirals lately.  EKG in clinic with normal sinus rhythm and while there are no ST segment depressions or elevations, she does have T-wave changes compared to prior EKG [EKG 01/2018 with TWI V4, V5, V6 and I which are absent today]. Pt denied prior cath but had a clean cath at [facility] in 2013. This was in the setting of RCA ischemia during stress testing.      Plan: While chest pain is atypical, the history is moderately worrisome. HEART score at least 5 and believe patient would benefit from observation with telemetry, troponins, AM EKG and perhaps further ischemic evaluation based on results. Given her history of immunosuppression and intermittent compliance with ARTs, she would also benefit from CXR to evaluate for possible pneumonia.

## 2018-06-17 NOTE — Progress Notes (Signed)
Patient assessment done. Telemetry connected and CCMD notified. Patient oriented to room and how to call nurse with any needs. CHG bath given

## 2018-06-17 NOTE — Progress Notes (Signed)
Report received from Ander Purpura, RN in ER

## 2018-06-17 NOTE — Patient Instructions (Signed)
Patient admitted

## 2018-06-17 NOTE — Progress Notes (Signed)
   CC: Chest pressure  HPI:  [name] is a 72 y.o. F with ESRD not on HD s/p renal transplant on Tacrolimus, Prednisone and Mycophenolate, uncontrolled HTN, HLD, type 2 diabetes, well-controlled HIV, history of syphilis, history of treated hepatitis C and history of abnormal stress test who presents today for evaluation of chest pressure.   For details regarding today's visit and the status of their chronic medical issues, please refer to the assessment and plan.  Past Medical History:  Diagnosis Date  . Chronic kidney disease    ESRD secondary to DM, started HD in 2008, Dr. Jimmy Footman is her nephrologist, received renal transplant in 2013, no longer on dialysis  . Diabetes mellitus    Las HbA1C   . Dialysis patient [facility])    T Th Sat  . Early cataracts, bilateral   . GERD (gastroesophageal reflux disease)   . Hepatitis C    untreated. VL 3700000 in 2008  . History of hepatitis C 08/04/2015  . HIV infection ([place])    Dx in 1998 in [place], she presented with PCP pneumonia at that time. Has been tried on multiple regimens by her PCP in [place] before./ She has been on current ART for years now. Moved to [facility] in 2008 and is following with Dr. Tommy Medal since then.   . Hypertension   . PONV (postoperative nausea and vomiting)   . Wears glasses    Review of Systems:   General: Admitted to fevers, night sweats and fatigue. Denies significant weight loss HEENT: Denies acute changes in vision, sore throat, jaw pain Cardiac: Admits to chest pressure, sharp chest pain and DOE. Denied SOB at rest Pulmonary: Admits to cough (at baseline, not productive). Denied wheezing or PND.  Abd: Admits to occasional diarrhea (normal for pt). Denies abdominal pain, changes in bowels Extremities: Denies weakness or swelling  Physical Exam: General: Alert, in no acute distress but does seem worried and fatigued. Pleasant and conversant HEENT: No icterus, injection or ptosis. No hoarseness or dysarthria    Cardiac: RRR. Faint murmur RUSB. No chest wall tenderness to palpation. Pulmonary: ?LLL rales. Speaks in complete sentences. No cough during our exam.  Abd: Soft, non-tender. +bs Extremities: LUE AVG with good thrill. Warm, perfused. No significant pedal edema.   Vitals:   06/17/18 1515 06/17/18 1517  BP: (!) 170/70 (!) 156/66  Pulse: 79 70  Temp: 98.6 F (37 C)   TempSrc: Oral   SpO2: 100%   Weight: 154 lb 11.2 oz (70.2 kg)   Height: 5\' 3"  (1.6 m)    Body mass index is 27.4 kg/m.  Assessment & Plan:   See Encounters Tab for problem based charting.  Patient discussed with Dr. Daryll Drown

## 2018-06-17 NOTE — H&P (Addendum)
Date: 06/17/2018               Patient Name:  [name] MRN: [id number]  DOB: [birth year] Age / Sex: 72 y.o., female   PCP: Lorella Nimrod, MD              Medical Service: Internal Medicine Teaching Service              Attending Physician: Dr. Aldine Contes, MD    First Contact: Tommye Standard, MS4 Pager: [phone]  Second Contact: Dr. Lorella Nimrod Pager: [phone]  Third Contact Dr. Celine Ahr         After Hours (After 5p/  First Contact Pager: [phone]  weekends / holidays): Second Contact Pager: [phone]   Chief Complaint: chest pain  History of Present Illness: Ms. Laforest is a 72 yo female with PMHx significant for HIV, last CD4 170 with undetectable viral load in June 2019, CKD s/p renal tx on chronic immunosupression, T2DM, HTN, reported hx of CHF without records, hx HepC, GERD, and Syphilis s/p treatment in 2013 presenting for chest pain. Pt states he pain started 1 week ago and is located in the center of her chest and radiates to her back. Pain is worse in the morning, but improves throughout the day as she moves around. This morning the pain was 10/10 in severity, so pt called the clinic and was told to come in to the ED. She endorses the pain is dull and associated with heaviness. Pt denies radiation to arms, neck, jaw, abdomen. Denies nausea. Denies cough. Endorses 2 week hx of nightsweats not associated with CP. Endorses "fever" when checked at home below 100. Pt states this feels like when she was "dianosed with CHF." She denies orthopnea, PNA, and LE swelling. Pt endorses chronic diarrhea shes had since cholecystectomy. Pt denies personal hx of heart disease. Endorses hx of PCP, during which she stated she did not have a cough. Pt states her wishes to be DNR.   Meds:  Amlodipine 10mg  po qd ASA 81mg  po qd Calcitriol 0.60mcg po qd Tums PRN CoReg 12.5mg  po BID Vit D3 Cinacalcet 60mg  po qd Voltaren 1% gel 2g topically PRN QID dolutegravir (Tivicay) 50mg  po  qd Fluticasone 12mcg/ACT nasal spray Lasix 80mg  BID Xultrophy (degludec- liraglutide) 100 / 3.6 SQ qAM Lamivudine (Epivir) 100mg  po qd Loratidine (Claritin) 10mg  po qd Mycophenolate (MyFortic 180mg EC) 540mg  po BID KCl 40mEq po qd Prednisone 5mg  po qd Ranitidine (Zantac) 150mg  po BID Rosuvastatin (Crestor) 20mg  po qd Tacrolimus (Prograf) 4mg  po qAM and 3mg  po qHS   Allergies: Allergies as of 06/17/2018 - Review Complete 06/17/2018  Allergen Reaction Noted  . Omeprazole Other (See Comments) Nov 28, 202016  . Influenza virus vacc split pf Other (See Comments) 07/16/2011  . Influenza vaccines Other (See Comments) 06/07/2012   Past Medical History:  Diagnosis Date  . Chronic kidney disease    ESRD secondary to DM, started HD in 2008, Dr. Jimmy Footman is her nephrologist, received renal transplant in 2013, no longer on dialysis  . Diabetes mellitus    Las HbA1C   . Dialysis patient [facility])    T Th Sat  . Early cataracts, bilateral   . GERD (gastroesophageal reflux disease)   . Hepatitis C    untreated. VL 3700000 in 2008  . History of hepatitis C 08/04/2015  . HIV infection ([place])    Dx in 1998 in [place], she presented with PCP pneumonia at that time. Has been tried on multiple regimens by her PCP  in [place] before./ She has been on current ART for years now. Moved to [facility] in 2008 and is following with Dr. Tommy Medal since then.   . Hypertension   . PONV (postoperative nausea and vomiting)   . Wears glasses     Family History: DM - mother HTN - father Colon CA - father Breast CA - daughter  Social History: Tobacco use for 35 years before quitting in 1998. Denies alcohol or drug use.   Review of Systems: A complete ROS was negative except as per HPI.   Physical Exam: T 98.2 BP 165/94 HR 80 RR 22 SpO2 100  Gen: well appearing, NAD, lying comfortably in bed, speaking in full sentences HEENT: PERRL, EOMI, MMM Cardio: RRR, no murmurs, 1+ radial pulses bilaterally Resp: no increased WOB,  LCAB Abdomen: midline sagittal scar, soft, non-distended, non-TTP, no HSM, +BS Extremities: warm, no pitting edema Neuro: A&O x 3, normal speech Psych: cooperative, pleasant, good insight and judgement   EKG: personally reviewed my interpretation is NSR, rate 70, left axis deviation, no ST changes or TWI concerning for ischemia, no LVH  Assessment & Plan by Problem: Ms. Mccary is a 72 yo female with PMHx significant for ESRD on HD, HIV, last CD4 170 with undetectable viral lobe in June 2019, T2DM, HTN, CKD s/p renal tx on chronic immunosupression, GERD, reported hx CHF without records, hx HepC, and Syphilis s/p tx in 2013 presenting for chest pain.  #Atypical Chest Pain -DDx: CAP, PCP, ACS, CHF exacerbation -ACS risk factors include - age, DM, HTN, HIV -pain improves with exertion, 1 week hx -EKG without signs of ischemia -troponin <0.03 Plan: -CXR pending -trend troponin -continue home Lasix 80mg  BID  #CKD s/p renal tx on chronic immunosuppression  -continue renal vitamins -continue MgOxide -continue mycophenolate 540 BID - continue Tacrolimus (Prograf) 4mg  po qAM and 3mg  po qHS -continue prednisone 5mg  -Cr on admission 4.36 -baseline Cr appears inconsistent from 2.2-4  #HIV -continue home ARVT - Tivicay (dolutegravir), Epivir (lamivudine)  #T2DM -BG 212 on arrival  -continue ASA 81mg  -continue Crestor 20mg  -Continue Xultophy (Degludec 100 / Liraglutide 3.6) -Sensitive Sliding Scale novolog  #HTN -continue home amlodipine 10mg  -continue CoReg 12.5 BID  #DVT Ppx: heparin  #Code Status: DNR  #Dispo: Inpatient admission with expected LOS 24-48hrs.   Signed: Christin Bach, Medical Student 06/17/2018, 7:30 PM  Pager: [phone]  Attestation for Student Documentation:  I personally was present and performed or re-performed the history, physical exam and medical decision-making activities of this service and have verified that the service and findings are  accurately documented in the student's note.  Velna Ochs, MD 06/17/2018, 8:02 PM

## 2018-06-18 ENCOUNTER — Encounter: Payer: Self-pay | Admitting: *Deleted

## 2018-06-18 ENCOUNTER — Telehealth: Payer: Self-pay

## 2018-06-18 ENCOUNTER — Observation Stay (HOSPITAL_BASED_OUTPATIENT_CLINIC_OR_DEPARTMENT_OTHER): Payer: Medicare Other

## 2018-06-18 DIAGNOSIS — E1122 Type 2 diabetes mellitus with diabetic chronic kidney disease: Secondary | ICD-10-CM

## 2018-06-18 DIAGNOSIS — Z887 Allergy status to serum and vaccine status: Secondary | ICD-10-CM

## 2018-06-18 DIAGNOSIS — I129 Hypertensive chronic kidney disease with stage 1 through stage 4 chronic kidney disease, or unspecified chronic kidney disease: Secondary | ICD-10-CM

## 2018-06-18 DIAGNOSIS — H269 Unspecified cataract: Secondary | ICD-10-CM | POA: Diagnosis not present

## 2018-06-18 DIAGNOSIS — Z79899 Other long term (current) drug therapy: Secondary | ICD-10-CM

## 2018-06-18 DIAGNOSIS — I5030 Unspecified diastolic (congestive) heart failure: Secondary | ICD-10-CM | POA: Diagnosis not present

## 2018-06-18 DIAGNOSIS — Z888 Allergy status to other drugs, medicaments and biological substances status: Secondary | ICD-10-CM | POA: Diagnosis not present

## 2018-06-18 DIAGNOSIS — N186 End stage renal disease: Secondary | ICD-10-CM | POA: Diagnosis not present

## 2018-06-18 DIAGNOSIS — B20 Human immunodeficiency virus [HIV] disease: Secondary | ICD-10-CM | POA: Diagnosis not present

## 2018-06-18 DIAGNOSIS — Z87891 Personal history of nicotine dependence: Secondary | ICD-10-CM | POA: Diagnosis not present

## 2018-06-18 DIAGNOSIS — I132 Hypertensive heart and chronic kidney disease with heart failure and with stage 5 chronic kidney disease, or end stage renal disease: Secondary | ICD-10-CM | POA: Diagnosis not present

## 2018-06-18 DIAGNOSIS — N189 Chronic kidney disease, unspecified: Secondary | ICD-10-CM | POA: Diagnosis not present

## 2018-06-18 DIAGNOSIS — Z992 Dependence on renal dialysis: Secondary | ICD-10-CM | POA: Diagnosis not present

## 2018-06-18 DIAGNOSIS — Z21 Asymptomatic human immunodeficiency virus [HIV] infection status: Secondary | ICD-10-CM

## 2018-06-18 DIAGNOSIS — B1920 Unspecified viral hepatitis C without hepatic coma: Secondary | ICD-10-CM

## 2018-06-18 DIAGNOSIS — I420 Dilated cardiomyopathy: Secondary | ICD-10-CM | POA: Diagnosis not present

## 2018-06-18 DIAGNOSIS — Z940 Kidney transplant status: Secondary | ICD-10-CM

## 2018-06-18 DIAGNOSIS — Z8619 Personal history of other infectious and parasitic diseases: Secondary | ICD-10-CM | POA: Diagnosis not present

## 2018-06-18 DIAGNOSIS — K219 Gastro-esophageal reflux disease without esophagitis: Secondary | ICD-10-CM | POA: Diagnosis not present

## 2018-06-18 LAB — COMPREHENSIVE METABOLIC PANEL
ALBUMIN: 3.2 g/dL — AB (ref 3.5–5.0)
ALT: 19 U/L (ref 0–44)
ANION GAP: 13 (ref 5–15)
AST: 20 U/L (ref 15–41)
Alkaline Phosphatase: 53 U/L (ref 38–126)
BILIRUBIN TOTAL: 0.6 mg/dL (ref 0.3–1.2)
BUN: 57 mg/dL — AB (ref 8–23)
CALCIUM: 7.8 mg/dL — AB (ref 8.9–10.3)
CO2: 19 mmol/L — ABNORMAL LOW (ref 22–32)
Chloride: 104 mmol/L (ref 98–111)
Creatinine, Ser: 4.05 mg/dL — ABNORMAL HIGH (ref 0.44–1.00)
GFR calc Af Amer: 12 mL/min — ABNORMAL LOW (ref >60)
GFR, EST NON AFRICAN AMERICAN: 10 mL/min — AB (ref >60)
GLUCOSE: 240 mg/dL — AB (ref 70–99)
Potassium: 3.6 mmol/L (ref 3.5–5.1)
Sodium: 136 mmol/L (ref 135–145)
TOTAL PROTEIN: 6.3 g/dL — AB (ref 6.5–8.1)

## 2018-06-18 LAB — CBC
HCT: 33.1 % — ABNORMAL LOW (ref 36.0–46.0)
Hemoglobin: 10.8 g/dL — ABNORMAL LOW (ref 12.0–15.0)
MCH: 28.3 pg (ref 26.0–34.0)
MCHC: 32.6 g/dL (ref 30.0–36.0)
MCV: 86.6 fL (ref 78.0–100.0)
Platelets: 174 10*3/uL (ref 150–400)
RBC: 3.82 MIL/uL — ABNORMAL LOW (ref 3.87–5.11)
RDW: 13.5 % (ref 11.5–15.5)
WBC: 5.7 10*3/uL (ref 4.0–10.5)

## 2018-06-18 LAB — GLUCOSE, CAPILLARY
GLUCOSE-CAPILLARY: 104 mg/dL — AB (ref 70–99)
Glucose-Capillary: 206 mg/dL — ABNORMAL HIGH (ref 70–99)

## 2018-06-18 LAB — TROPONIN I
Troponin I: <0.03 ng/mL (ref <0.03)
Troponin I: <0.03 ng/mL (ref <0.03)

## 2018-06-18 MED ORDER — GI COCKTAIL ~~LOC~~: 30.0000 mL | Freq: Once | ORAL | Status: DC

## 2018-06-18 NOTE — Telephone Encounter (Signed)
Hospital TOC per Dr Reesa Chew, discharge 06/18/2018 ,appt 06/24/2018.

## 2018-06-18 NOTE — Progress Notes (Signed)
  Echocardiogram 2D Echocardiogram has been performed.  [name] 06/18/2018, 1:59 PM

## 2018-06-18 NOTE — Discharge Summary (Addendum)
Name: [name] MRN: [id number] DOB: Jan 04, 1946 72 y.o. PCP: Lorella Nimrod, MD  Date of Admission: 06/17/2018  5:40 PM Date of Discharge: 06/18/18 Attending Physician: Aldine Contes, MD  Discharge Diagnosis: 1. GERD 2. HIV 3. CKD V s/p renal transplant  Discharge Medications: Allergies as of 06/18/2018      Reactions   Omeprazole Other (See Comments)   Interferes with the absorption of rilipivirine   Influenza Virus Vacc Split Pf Other (See Comments)   UNSPECIFIED REACTION  Alleged severe illness in [place]. Do not have documentation from [place] but pt always refuses vaccine on these grounds   Influenza Vaccines Other (See Comments)   Other reaction(s): Hallucination      Medication List    TAKE these medications   amLODipine 10 MG tablet Commonly known as:  NORVASC Take 10 mg by mouth daily.   aspirin 81 MG EC tablet Take 81 mg by mouth daily.   calcitRIOL 0.25 MCG capsule Commonly known as:  ROCALTROL Take 0.25 mcg by mouth daily.   calcium carbonate 500 MG chewable tablet Commonly known as:  TUMS - dosed in mg elemental calcium Chew 1 tablet by mouth daily as needed for indigestion or heartburn.   carvedilol 12.5 MG tablet Commonly known as:  COREG Take 1 tablet (12.5 mg total) 2 (two) times daily with a meal by mouth.   cinacalcet 60 MG tablet Commonly known as:  SENSIPAR Take 60 mg by mouth daily.   diclofenac sodium 1 % Gel Commonly known as:  VOLTAREN Apply 2 g topically 4 (four) times daily.   dolutegravir 50 MG tablet Commonly known as:  TIVICAY Take 1 tablet (50 mg total) by mouth daily.   fluticasone 50 MCG/ACT nasal spray Commonly known as:  FLONASE SHAKE LIQUID WELL AND USE 1 SPRAY IN EACH NOSTRIL DAILY What changed:  See the new instructions.   freestyle lancets Use as instructed   FREESTYLE TEST STRIPS test strip Generic drug:  glucose blood Use with glucometer   furosemide 80 MG tablet Commonly known as:  LASIX Take 80 mg  by mouth 2 (two) times daily.   Insulin Degludec-Liraglutide 100-3.6 UNIT-MG/ML Sopn Commonly known as:  XULTOPHY Inject 16 Units into the skin every morning.   Insulin Pen Needle 32G X 4 MM Misc 16 Units by Does not apply route daily.   lamivudine 100 MG tablet Commonly known as:  EPIVIR Take 1 tablet (100 mg total) by mouth daily.   loratadine 10 MG tablet Commonly known as:  CLARITIN Take 1 tablet (10 mg total) by mouth daily.   magnesium oxide 400 MG tablet Commonly known as:  MAG-OX Take 400 mg by mouth daily.   mycophenolate 180 MG EC tablet Commonly known as:  MYFORTIC Take 540 mg by mouth 2 (two) times daily.   oxyCODONE-acetaminophen 5-325 MG tablet Commonly known as:  PERCOCET Take 1 tablet by mouth every 6 (six) hours as needed for severe pain.   potassium chloride SA 20 MEQ tablet Commonly known as:  K-DUR,KLOR-CON Take 20 mEq by mouth daily.   predniSONE 5 MG tablet Commonly known as:  DELTASONE Take 5 mg by mouth daily with breakfast.   ranitidine 150 MG tablet Commonly known as:  ZANTAC TAKE 1 TABLET(150 MG) BY MOUTH TWICE DAILY   rosuvastatin 20 MG tablet Commonly known as:  CRESTOR Take 20 mg by mouth daily.   tacrolimus 1 MG capsule Commonly known as:  PROGRAF Take 3-4 mg by mouth See admin  instructions. Take 4 mg by mouth in the morning and take 3 mg by mouth in the evening       Disposition and follow-up:   [name] was discharged from [facility] in Good condition.  At the hospital follow up visit please address:  1.  Please follow up treatment of pts GERD keeping in mind that PPis and H2 blockers may interact with some of her HIV medications, see details in ID note from 6/27.  2.  Labs / imaging needed at time of follow-up: NA  3.  Pending labs/ test needing follow-up: TTE  Follow-up Appointments: Follow-up Information    [place]. Go to.   Why:  Please go to your hospital  follow up appointment with Dr. Reesa Chew on July 22 at 2:45pm. Contact information: [address] [place] [facility] Course by problem list: 1. GERD Pt presented with 1week hx of atypical chest pain, worse in the morning that radiates to her back. Pain improved with exertion. Pt was without fever, cough, orthopnea, or PND. Pt was admitted d/t significant ACS risk factors including HTN, DM, and HIV. Pts troponin were negative x 3. CXR was unremarkable for PNA. BNP was elevated to 243, however this was unreliable in the setting of CKD without a baseline comparison. Pt was not grossly volume overloaded on exam. Pt has a history of indigestion, which appears to be the most likely cause of pts atypical chest pain. Pt has had problems in the past with PPis and H2 interacting with her HIV medications per ID note dated 05/30/18. Pt could be considered for cardiac stress test considering her risk factors at the discretion of the PCP.  2. HIV Pt was continued on home Tivicay and Epivir for antiretroviral therapy. Recent CD4 in June 2019 of 170 was concerning for PCP PNA, however with clear CXR and unremarkable findings clinically, no cough, no fever, we do not believe pts chest pani is due to opportunistic infection.  3. CKD V s/p renal transplant Pts Cr on arrival was 4.36. Pts baseline appears to fluctuate between 2.5 and 4 recently with eGFR of about 10. Pt is being prepared in the outpatient setting to once again start hemodialysis. Pt did not require hemodialysis during hospitalization.   Discharge Vitals:   BP (!) 148/71   Pulse 69   Temp 98.8 F (37.1 C) (Oral)   Resp 13   Ht _0  (1.6 m)   Wt 68.8 kg (151 lb 9.6 oz)   SpO2 98%   BMI 26.85 kg/m   Pertinent Labs, Studies, and Procedures:  Troponin <0.03 x 3 BNP 243 TSH 1.2 HbA1c 6.9 Cr 4.36 BUN 63  EKG - unremarkable for ischemia CXR - unremarkable for PNA or other acute  process  Discharge Instructions: Discharge Instructions    Call MD for:  difficulty breathing, headache or visual disturbances   Complete by:  As directed    Call MD for:  temperature >100.4   Complete by:  As directed    Diet - low sodium heart healthy   Complete by:  As directed    Discharge instructions   Complete by:  As directed    Ms. Portillo, you were in the hospital because of chest pain. We made sure that you were not having a heart attack and you do not have pneumonia. We believe your chest pain is indigestion related  to your reflux. Please follow up with Dr. Reesa Chew on July 22 at 2:45pm to discuss treatment of your reflux, which we think will help with your chest pain.   Increase activity slowly   Complete by:  As directed       Signed: Coffer, Earnest Conroy, Medical Student 06/18/2018, 1:18 PM   Pager: [phone]  Attestation for Student Documentation:  I personally was present and performed or re-performed the history, physical exam and medical decision-making activities of this service and have verified that the service and findings are accurately documented in the student's note.  Lorella Nimrod, MD 06/18/2018, 4:34 PM

## 2018-06-18 NOTE — Progress Notes (Signed)
Internal Medicine Clinic Attending  Case discussed with Dr. Molt at the time of the visit.  We reviewed the resident's history and exam and pertinent patient test results.  I agree with the assessment, diagnosis, and plan of care documented in the resident's note. 

## 2018-06-18 NOTE — Progress Notes (Signed)
  Date: 06/18/2018  Patient name: [name]  Medical record number: [id number]  Date of birth: Feb 09, 1946   I have seen and evaluated [name] and discussed their care with the Residency Team.  In brief, patient is 72 year old female with a past medical history of HIV, CKD status post renal transplant, type 2 diabetes, hypertension, hepatitis C, GERD and syphilis status post treatment who presented with chest pain x1 week.  Patient states that chest pain is pressure-like, substernal, radiating to her back.  Pain is worse when she wakes up in the morning but improves throughout the day.  No relation to exertion.  Yesterday morning patient noted that the chest pain was worse and came to the clinic for follow-up.  No nausea or vomiting, no fevers or chills, no lightheadedness, syncope, no focal weakness, no headache, no blurry vision, no palpitations, no shortness of breath.  Today patient states that her chest pain has resolved and she feels well.  PMHx, Fam Hx, and/or Soc Hx : As per resident admit note  Vitals:   06/18/18 0826 06/18/18 1124  BP: (!) 161/77 (!) 148/71  Pulse: 71 69  Resp:  13  Temp:    SpO2:  98%   General: Awake, alert, oriented x3, NAD CVS: Regular rate and rhythm, normal heart sounds Lungs: CTA bilaterally Abdomen: Soft, nontender, nondistended, normoactive bowel sounds Extremities: No edema noted  Assessment and Plan: I have seen and evaluated the patient as outlined above. I agree with the formulated Assessment and Plan as detailed in the residents' note, with the following changes:   1.  Chest pain: -Patient was admitted from [facility] for recurrent chest pain over the last week which was pressure-like and substernal but had no relation to exertion and did not radiate to her jaw or down her arm.  It is unlikely that her chest pain is cardiac in nature given its atypical qualities as well as negative work-up thus far. I suspect that her chest pain is likely  secondary to untreated reflux given that it was worse in the morning and that she has a history of reflux but has been off her medications secondary to possible interactions with her HIV medications -EKG with no acute ST/T wave changes -Troponins are negative x3 sets -Chest x-ray with no acute pathology -No further work-up at this time  -Will discuss with ID pharmacist regarding resumption of possible reflux medications-Patient is stable for discharge home today  Aldine Contes, MD 7/16/201912:35 PM

## 2018-06-18 NOTE — Progress Notes (Signed)
   Subjective: Pt without any chest pain this morning. Also denies SOB, cough, fever, and LE swelling. Pt was sitting up comfortably eating breakfast. Pt states she is ready to go home.   Objective:  Vital signs in last 24 hours: Vitals:   06/18/18 0505 06/18/18 0826 06/18/18 1124 06/18/18 1125  BP:  (!) 161/77 (!) 148/71   Pulse:  71 69   Resp:   13   Temp:      TempSrc:      SpO2:   98%   Weight: 68.8 kg (151 lb 9.6 oz)     Height:    5\' 3"  (1.6 m)    Gen: well appearing, NAD, sitting up eating breakfast Cardio: RRR, no murmurs, 1+ radial pulses bilaterally Resp: no increased WOB, LCAB Abdomen: soft, non-distended, non-TTP, +BS Extremities: warm, no pitting edema Neuro: A&O x 3, normal speech   Assessment/Plan: [name] is a 72 yo female with PMHx significant for ESRD on HD, HIV, last CD4 170 with undetectable viral lobe in June 2019, T2DM, HTN, CKD s/p renal tx on chronic immunosupression, GERD, reported hx CHF without records, hx HepC, and Syphilis s/p tx in 2013 presenting for chest pain.  #Atypical Chest Pain -DDx: CAP, PCP, ACS, GERD, CHF exacerbation -ACS risk factors include - age, DM, HTN, HIV -pain improves with exertion, 1 week hx -EKG without signs of ischemia -troponin <0.03 x 3 -BNP elevated 243, however unreliable in setting of CKD -CKR unremarkable  Plan: -pts chest pain is not d/t ACS or PNA, most likely d/t uncontrolled GERD and indigestion -GERD management complicated by interactions with HIV meds -will FU outpatient for appropriate GERD regimen -consider risk stratifying with stress test per PCP's discretion d/t ACS risk factors -continue home Lasix 80mg  BID  #CKD s/p renal tx on chronic immunosuppression  -continue renal vitamins -continue MgOxide -continue mycophenolate 540 BID - continue Tacrolimus (Prograf) 4mg  po qAM and 3mg  po qHS -continue prednisone 5mg  -Cr on admission 4.36 -baseline Cr appears inconsistent from  2.2-4  #HIV -continue home ARVT - Tivicay (dolutegravir), Epivir (lamivudine)  #T2DM -BG 212 on arrival  -continue ASA 81mg  -continue Crestor 20mg  -Continue Lantus 16u qAM -Sensitive Sliding Scale novolog  #HTN -continue home amlodipine 10mg  -continue CoReg 12.5 BID  #DVT Ppx: heparin  #Code Status: DNR  #Dispo: Inpatient admission with expected LOS <24hrs.    [name], [name], Medical Student 06/18/2018, 1:31 PM  Pager [phone]

## 2018-06-21 NOTE — Telephone Encounter (Signed)
Transition Care Management Follow-up Telephone Call  How have you been since you were released from the hospital? "Doing fine. Yesterday stomach not good and vomited once but nothing today."'  Do you understand why you were in the hospital? yes, "they wanted to check my heart and make sure I didn't have pneumonia."  Do you have a copy of your discharge instructions Yes Do you understand the discharge instrcutions? yes, knows to increase activity slowly.  Where were you discharged to? Home  Do you have support at home? No , lives alone.   Items Reviewed:  Medications obtained No new meds ordered at discharge  Medications reviewed: No, patient taking all meds as she has been  Dietary changes reviewed: yes, following low sodium, heart healthy.  Home Health? N/A  DME ordered at discharge obtained? NA  Medical supplies: NA    Functional Questionnaire:   Activities of Daily Living (ADLs):   She states they are independent in the following: ambulation, bathing and hygiene, feeding, continence, grooming, toileting, dressing and medication management States they require assistance with the following: NA  Any transportation issues/concerns?: no, takes bus or can get rides  Any patient concerns? no, not at this time  Confirmed importance and date/time of follow-up visits scheduled with PCP: Yes, 06/24/2018 at 1445. Asked to bring all meds with her to this appt.  Confirm appointment scheduled with specialist? Yes, aware of August appts with lab and ID.  Confirmed with patient if condition begins to worsen call PCP or If it's emergency go to the ER. Also given main hospital number with instructions to ask for [facility] Resident on call after hours. Hubbard Hartshorn, RN, BSN

## 2018-06-24 ENCOUNTER — Encounter: Payer: Self-pay | Admitting: *Deleted

## 2018-06-24 ENCOUNTER — Encounter: Payer: Self-pay | Admitting: Internal Medicine

## 2018-06-24 ENCOUNTER — Other Ambulatory Visit: Payer: Self-pay

## 2018-06-24 ENCOUNTER — Ambulatory Visit (INDEPENDENT_AMBULATORY_CARE_PROVIDER_SITE_OTHER): Payer: Medicare Other | Admitting: Internal Medicine

## 2018-06-24 DIAGNOSIS — Z794 Long term (current) use of insulin: Secondary | ICD-10-CM

## 2018-06-24 DIAGNOSIS — I10 Essential (primary) hypertension: Secondary | ICD-10-CM

## 2018-06-24 DIAGNOSIS — E1129 Type 2 diabetes mellitus with other diabetic kidney complication: Secondary | ICD-10-CM

## 2018-06-24 DIAGNOSIS — Z87891 Personal history of nicotine dependence: Secondary | ICD-10-CM | POA: Diagnosis not present

## 2018-06-24 DIAGNOSIS — Z79899 Other long term (current) drug therapy: Secondary | ICD-10-CM | POA: Diagnosis not present

## 2018-06-24 DIAGNOSIS — K219 Gastro-esophageal reflux disease without esophagitis: Secondary | ICD-10-CM

## 2018-06-24 DIAGNOSIS — Z21 Asymptomatic human immunodeficiency virus [HIV] infection status: Secondary | ICD-10-CM | POA: Diagnosis not present

## 2018-06-24 DIAGNOSIS — E119 Type 2 diabetes mellitus without complications: Secondary | ICD-10-CM | POA: Diagnosis not present

## 2018-06-24 DIAGNOSIS — R0789 Other chest pain: Secondary | ICD-10-CM

## 2018-06-24 DIAGNOSIS — E785 Hyperlipidemia, unspecified: Secondary | ICD-10-CM

## 2018-06-24 LAB — ECHOCARDIOGRAM COMPLETE
HEIGHTINCHES: 63 in
WEIGHTICAEL: 2425.6 [oz_av]

## 2018-06-24 MED ORDER — AMLODIPINE BESYLATE 10 MG PO TABS: 10.0000 mg | ORAL_TABLET | Freq: Every day | ORAL | 3 refills | Status: DC

## 2018-06-24 MED ORDER — CARVEDILOL 25 MG PO TABS: 25.0000 mg | ORAL_TABLET | Freq: Two times a day (BID) | ORAL | 11 refills | Status: DC

## 2018-06-24 NOTE — Assessment & Plan Note (Signed)
She denies any more GERD symptoms.  She was asking for a refill of her ranitidine  According to ID note on June 27 she should not be taking any PPI or H2 blocker because of an interaction with her HIV meds.  Advised the patient to use Tums if she needs for her symptoms.

## 2018-06-24 NOTE — Patient Instructions (Addendum)
Thank you for visiting clinic today. I am glad you are doing well, as we discussed you can use some Tums for your GERD symptoms as other medications react with your HIV medicines. I will call you once I have your echo results. Pressure was little elevated today-I am increasing the dose of carvedilol to 25 mg twice a day.  You can finish your existing tablets by taking 2 at a time, then take 1 tablet of your new prescription. Please follow-up in 1 month for your blood pressure check.

## 2018-06-24 NOTE — Assessment & Plan Note (Signed)
Did bring her glucometer with average of 109, high of 15 and low of 76.  She remained 85% within the target.  Denies any hypoglycemia symptoms.  Continue Xultophy as it shows marked improvement in her blood sugar.

## 2018-06-24 NOTE — Assessment & Plan Note (Signed)
BP Readings from Last 3 Encounters:  06/24/18 (!) 144/73  06/18/18 137/66  06/17/18 (!) 156/66   Her blood pressure was elevated today. She needs a better control of blood pressure because of her worsening renal function.  Increase Coreg to 25 mg twice daily. Continue amlodipine. I advised patient to check her blood pressure at home when she is relaxed and bring that log with her during next follow-up visit.

## 2018-06-24 NOTE — Progress Notes (Signed)
   CC: For hospital follow-up.  HPI:  [name] is a 72 y.o. with past medical history as listed below came to the clinic for hospital follow-up, she was admitted at [facility] from July 15 till July 16 with atypical chest pain which found to be most likely related to her GERD symptoms.  Patient denies any more pain. She has no other complaints today.  Please see assessment and plan for her chronic conditions.  Past Medical History:  Diagnosis Date  . CHF (congestive heart failure) ([place])   . Chronic kidney disease    ESRD secondary to DM, started HD in 2008, Dr. Jimmy Footman is her nephrologist, received renal transplant in 2013, no longer on dialysis (06/17/2018)  . Dialysis patient [facility])    T Th Sat  . Early cataracts, bilateral   . GERD (gastroesophageal reflux disease)   . Hepatitis C    untreated. VL 3700000 in 2008; pt reports this has been treated" (06/17/2018)  . High cholesterol   . History of blood transfusion 2013   "when I got the kidney" (06/17/2018)  . History of hepatitis C 08/04/2015  . HIV infection ([place]) 1998   Dx in 1998 in [place], she presented with PCP pneumonia at that time. Has been tried on multiple regimens by her PCP in [place] before./ She has been on current ART for years now. Moved to [facility] in 2008 and is following with Dr. Tommy Medal since then.   . Hypertension   . 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 PONV (postoperative nausea and vomiting)   . Type II diabetes mellitus ([place])   . Wears glasses    Review of Systems: Negative except mentioned in HPI.  Physical Exam:  Vitals:   06/24/18 1438  BP: (!) 144/73  Pulse: 83  Temp: 98 F (36.7 C)  TempSrc: Oral  SpO2: 99%  Weight: 149 lb 12.8 oz (67.9 kg)  Height: 5\' 3"  (1.6 m)   General: Vital signs reviewed.  Patient is well-developed and well-nourished, in no acute distress and cooperative with exam.  Head: Normocephalic and atraumatic. Eyes: EOMI, conjunctivae normal, no scleral  icterus.  Neck: Supple, trachea midline, normal ROM, no JVD, masses, thyromegaly, or carotid bruit present.  Cardiovascular: RRR, S1 normal, S2 normal, no murmurs, gallops, or rubs. Pulmonary/Chest: Clear to auscultation bilaterally, no wheezes, rales, or rhonchi. Abdominal: Soft, non-tender, non-distended, BS +, no masses, organomegaly, or guarding present.  Extremities: No lower extremity edema bilaterally,  pulses symmetric and intact bilaterally. No cyanosis or clubbing. Skin: Warm, dry and intact. No rashes or erythema. Psychiatric: Normal mood and affect. speech and behavior is normal. Cognition and memory are normal.  Assessment & Plan:   See Encounters Tab for problem based charting.  Patient discussed with Dr. Daryll Drown.

## 2018-06-24 NOTE — Assessment & Plan Note (Signed)
It has been resolved. No cardiac cause found. Echo with normal ejection fraction, no wall motion abnormalities and grade 1 diastolic dysfunction.  She does not need any further cardiac work-up at this time.

## 2018-06-26 NOTE — Progress Notes (Signed)
Internal Medicine Clinic Attending  Case discussed with Dr. Amin at the time of the visit.  We reviewed the resident's history and exam and pertinent patient test results.  I agree with the assessment, diagnosis, and plan of care documented in the resident's note.    

## 2018-06-27 NOTE — Telephone Encounter (Signed)
Appt kept 06/24/2018

## 2018-07-01 ENCOUNTER — Ambulatory Visit: Payer: Medicare Other

## 2018-07-05 ENCOUNTER — Other Ambulatory Visit: Payer: Medicare Other

## 2018-07-05 DIAGNOSIS — B182 Chronic viral hepatitis C: Secondary | ICD-10-CM | POA: Diagnosis not present

## 2018-07-05 DIAGNOSIS — B20 Human immunodeficiency virus [HIV] disease: Secondary | ICD-10-CM

## 2018-07-08 LAB — HIV-1 RNA QUANT-NO REFLEX-BLD
HIV 1 RNA Quant: <20 copies/mL
HIV-1 RNA Quant, Log: <1.3 Log copies/mL

## 2018-07-19 ENCOUNTER — Encounter: Payer: Self-pay | Admitting: Family

## 2018-07-19 ENCOUNTER — Encounter: Payer: Medicare Other | Admitting: Infectious Disease

## 2018-07-19 ENCOUNTER — Ambulatory Visit (INDEPENDENT_AMBULATORY_CARE_PROVIDER_SITE_OTHER): Payer: Medicare Other | Admitting: Family

## 2018-07-19 DIAGNOSIS — Z940 Kidney transplant status: Secondary | ICD-10-CM

## 2018-07-19 DIAGNOSIS — B20 Human immunodeficiency virus [HIV] disease: Secondary | ICD-10-CM | POA: Diagnosis not present

## 2018-07-19 NOTE — Patient Instructions (Signed)
Nice to meet you.  Continue to take your medications as prescribed.  Plan to follow up with Dr. Tommy Medal in 4 months with blood work 1-2 weeks prior to appointment.   You will be due for your next pneumonia vaccination at your next office visit.  Please schedule an appointment for your flu shot in September.

## 2018-07-19 NOTE — Assessment & Plan Note (Signed)
Stable and followed by [facility] transplant.  Previous biopsy with no evidence of rejection.  Continue medications and follow-up per transplant team.

## 2018-07-19 NOTE — Assessment & Plan Note (Signed)
[name] as well controlled AIDS with a viral load that is undetectable.  CD4 count remains low at 170.  She appears adherent to her medication regimen with no adverse side effects.  All immunizations are up-to-date per recommendations.  Continue current dose of dolutegravir and Descovy.  Follow-up office visit in 4 months or sooner if needed with blood work 1 to 2 weeks prior to appointment.

## 2018-07-19 NOTE — Progress Notes (Signed)
Subjective:    Patient ID: [name], female    DOB: 03/26/1946, 72 y.o.   MRN: [id number]  Chief Complaint  Patient presents with  . HIV Positive/AIDS    HPI:  [name] is a 72 y.o. female who presents today for routine follow up of HIV disease.  [name] was last seen in the office on 05/30/18. Her medication regimen was changed to dolutegravir and lamivudine. She was virologically suppressed with a CD4 count of 170. Continues to be followed by [facility] following kidney transplant. Most recent blood work reviewed from 07/05/18 with a viral load that remains undetectable with no CD4 count available for review.. Her RPR remains at 1:2 with previous elevation of 1:4 with no documentation of treatment recently.  [name] has been taking her dolutegravir and lamivudine as prescribed with no adverse side effects. She previously had indigestion with the rilpivirine which has resolved. She has missed about 1 dose of medication since her last office visit. No problems obtaining her medication. Denies fevers, chills, night sweats, headaches, changes in vision, neck pain/stiffness, nausea, diarrhea, vomiting, lesions or rashes.  She is getting ready to go to visit her sister in [place] in a couple of weeks.    Allergies  Allergen Reactions  . Omeprazole Other (See Comments)    Interferes with the absorption of rilipivirine  . Influenza Virus Vacc Split Pf Other (See Comments)    UNSPECIFIED REACTION  Alleged severe illness in [place]. Do not have documentation from [place] but pt always refuses vaccine on these grounds  . Influenza Vaccines Other (See Comments)    Other reaction(s): Hallucination      Outpatient Medications Prior to Visit  Medication Sig Dispense Refill  . amLODipine (NORVASC) 10 MG tablet Take 1 tablet (10 mg total) by mouth daily. 90 tablet 3  . aspirin 81 MG EC tablet Take 81 mg by mouth daily.      . calcitRIOL (ROCALTROL) 0.25 MCG capsule Take 0.25 mcg by mouth  daily.  6  . calcium carbonate (TUMS - DOSED IN MG ELEMENTAL CALCIUM) 500 MG chewable tablet Chew 1 tablet by mouth daily as needed for indigestion or heartburn.    . carvedilol (COREG) 25 MG tablet Take 1 tablet (25 mg total) by mouth 2 (two) times daily with a meal. 60 tablet 11  . cinacalcet (SENSIPAR) 60 MG tablet Take 60 mg by mouth daily.    . diclofenac sodium (VOLTAREN) 1 % GEL Apply 2 g topically 4 (four) times daily. 100 g 2  . dolutegravir (TIVICAY) 50 MG tablet Take 1 tablet (50 mg total) by mouth daily. 30 tablet 5  . fluticasone (FLONASE) 50 MCG/ACT nasal spray SHAKE LIQUID WELL AND USE 1 SPRAY IN EACH NOSTRIL DAILY (Patient taking differently: Use 1 spray in each nostril daily as needed for allergies) 16 g 0  . FREESTYLE TEST STRIPS test strip Use with glucometer 100 each 9  . furosemide (LASIX) 80 MG tablet Take 80 mg by mouth 2 (two) times daily.  6  . Insulin Degludec-Liraglutide (XULTOPHY) 100-3.6 UNIT-MG/ML SOPN Inject 16 Units into the skin every morning. 4 pen 4  . Insulin Pen Needle 32G X 4 MM MISC 16 Units by Does not apply route daily. 100 each 2  . lamivudine (EPIVIR) 100 MG tablet Take 1 tablet (100 mg total) by mouth daily. 30 tablet 5  . Lancets (FREESTYLE) lancets Use as instructed 100 each 12  . magnesium oxide (MAG-OX) 400  MG tablet Take 400 mg by mouth daily.    . mycophenolate (MYFORTIC) 180 MG EC tablet Take 540 mg by mouth 2 (two) times daily.    Marland Kitchen oxyCODONE-acetaminophen (PERCOCET) 5-325 MG tablet Take 1 tablet by mouth every 6 (six) hours as needed for severe pain. 8 tablet 0  . potassium chloride SA (K-DUR,KLOR-CON) 20 MEQ tablet Take 20 mEq by mouth daily.  10  . predniSONE (DELTASONE) 5 MG tablet Take 5 mg by mouth daily with breakfast.    . rosuvastatin (CRESTOR) 20 MG tablet Take 20 mg by mouth daily.  3  . tacrolimus (PROGRAF) 1 MG capsule Take 3-4 mg by mouth See admin instructions. Take 4 mg by mouth in the morning and take 3 mg by mouth in the evening     . loratadine (CLARITIN) 10 MG tablet Take 1 tablet (10 mg total) by mouth daily. (Patient not taking: Reported on 07/19/2018) 30 tablet 2   No facility-administered medications prior to visit.      Past Medical History:  Diagnosis Date  . CHF (congestive heart failure) ([place])   . Chronic kidney disease    ESRD secondary to DM, started HD in 2008, Dr. Jimmy Footman is her nephrologist, received renal transplant in 2013, no longer on dialysis (06/17/2018)  . Dialysis patient [facility])    T Th Sat  . Early cataracts, bilateral   . GERD (gastroesophageal reflux disease)   . Hepatitis C    untreated. VL 3700000 in 2008; pt reports this has been treated" (06/17/2018)  . High cholesterol   . History of blood transfusion 2013   "when I got the kidney" (06/17/2018)  . History of hepatitis C 08/04/2015  . HIV infection ([place]) 1998   Dx in 1998 in [place], she presented with PCP pneumonia at that time. Has been tried on multiple regimens by her PCP in [place] before./ She has been on current ART for years now. Moved to [facility] in 2008 and is following with Dr. Tommy Medal since then.   . Hypertension   . 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 PONV (postoperative nausea and vomiting)   . Type II diabetes mellitus ([place])   . Wears glasses      Past Surgical History:  Procedure Laterality Date  . ABDOMINAL HYSTERECTOMY     and oopherectomy for fibroids  . BASCILIC VEIN TRANSPOSITION Left 01/29/2018   Procedure: RIGHT BRACIOCEPHALIC AV FISTULA;  Surgeon: Angelia Mould, MD;  Location: [place];  Service: Vascular;  Laterality: Left;  . CHOLECYSTECTOMY OPEN    . KIDNEY TRANSPLANT  2013     Review of Systems  Constitutional: Negative for appetite change, chills, diaphoresis, fatigue, fever and unexpected weight change.  Eyes:       Negative for acute change in vision  Respiratory: Negative for chest tightness, shortness of breath and wheezing.   Cardiovascular: Negative for chest pain.    Gastrointestinal: Negative for diarrhea, nausea and vomiting.  Genitourinary: Negative for dysuria, pelvic pain and vaginal discharge.  Musculoskeletal: Negative for neck pain and neck stiffness.  Skin: Negative for rash.  Neurological: Negative for seizures, syncope, weakness and headaches.  Hematological: Negative for adenopathy. Does not bruise/bleed easily.  Psychiatric/Behavioral: Negative for hallucinations.      Objective:    BP (!) 158/89   Pulse 83   Temp 98.2 F (36.8 C)   Ht 5\' 3"  (1.6 m)   Wt 150 lb (68 kg)   BMI 26.57 kg/m  Nursing note  and vital signs reviewed.  Physical Exam  Constitutional: She is oriented to person, place, and time. She appears well-developed. No distress.  HENT:  Mouth/Throat: Oropharynx is clear and moist.  Eyes: Conjunctivae are normal.  Neck: Neck supple.  Cardiovascular: Normal rate, regular rhythm, normal heart sounds and intact distal pulses. Exam reveals no gallop and no friction rub.  No murmur heard. Pulmonary/Chest: Effort normal and breath sounds normal. No respiratory distress. She has no wheezes. She has no rales. She exhibits no tenderness.  Abdominal: Soft. Bowel sounds are normal. There is no tenderness.  Lymphadenopathy:    She has no cervical adenopathy.  Neurological: She is alert and oriented to person, place, and time.  Skin: Skin is warm and dry. No rash noted.  Psychiatric: She has a normal mood and affect. Her behavior is normal.       Assessment & Plan:   Problem List Items Addressed This Visit      Other   Kidney transplant recipient    Stable and followed by [facility] transplant.  Previous biopsy with no evidence of rejection.  Continue medications and follow-up per transplant team.      AIDS (acquired immune deficiency syndrome) ([place])    [name] as well controlled AIDS with a viral load that is undetectable.  CD4 count remains low at 170.  She appears adherent to her medication  regimen with no adverse side effects.  All immunizations are up-to-date per recommendations.  Continue current dose of dolutegravir and Descovy.  Follow-up office visit in 4 months or sooner if needed with blood work 1 to 2 weeks prior to appointment.      Relevant Orders   T-helper cell (CD4)- ([facility] clinic only)   HIV 1 RNA quant-no reflex-bld   CBC   Comprehensive metabolic panel   RPR   Lipid panel       I am having [name] maintain her aspirin, tacrolimus, loratadine, fluticasone, magnesium oxide, mycophenolate, cinacalcet, calcium carbonate, Insulin Degludec-Liraglutide, predniSONE, oxyCODONE-acetaminophen, dolutegravir, lamivudine, rosuvastatin, furosemide, potassium chloride SA, calcitRIOL, diclofenac sodium, FREESTYLE TEST STRIPS, Insulin Pen Needle, freestyle, amLODipine, and carvedilol.    Follow-up: Return in about 4 months (around 11/18/2018), or if symptoms worsen or fail to improve.   Terri Piedra, MSN, FNP-C Nurse Practitioner [facility] [place] [facility] Office phone: [phone] Pager: [place] number: [phone]

## 2018-08-19 ENCOUNTER — Encounter: Payer: Medicare Other | Admitting: Internal Medicine

## 2018-09-02 DIAGNOSIS — B182 Chronic viral hepatitis C: Secondary | ICD-10-CM | POA: Diagnosis not present

## 2018-09-02 DIAGNOSIS — N2581 Secondary hyperparathyroidism of renal origin: Secondary | ICD-10-CM | POA: Diagnosis not present

## 2018-09-02 DIAGNOSIS — B20 Human immunodeficiency virus [HIV] disease: Secondary | ICD-10-CM | POA: Diagnosis not present

## 2018-09-02 DIAGNOSIS — R809 Proteinuria, unspecified: Secondary | ICD-10-CM | POA: Diagnosis not present

## 2018-09-02 DIAGNOSIS — I120 Hypertensive chronic kidney disease with stage 5 chronic kidney disease or end stage renal disease: Secondary | ICD-10-CM | POA: Diagnosis not present

## 2018-09-02 DIAGNOSIS — R319 Hematuria, unspecified: Secondary | ICD-10-CM | POA: Diagnosis not present

## 2018-09-02 DIAGNOSIS — D631 Anemia in chronic kidney disease: Secondary | ICD-10-CM | POA: Diagnosis not present

## 2018-09-02 DIAGNOSIS — E118 Type 2 diabetes mellitus with unspecified complications: Secondary | ICD-10-CM | POA: Diagnosis not present

## 2018-09-02 DIAGNOSIS — Z940 Kidney transplant status: Secondary | ICD-10-CM | POA: Diagnosis not present

## 2018-09-02 DIAGNOSIS — E1122 Type 2 diabetes mellitus with diabetic chronic kidney disease: Secondary | ICD-10-CM | POA: Diagnosis not present

## 2018-09-02 DIAGNOSIS — E782 Mixed hyperlipidemia: Secondary | ICD-10-CM | POA: Diagnosis not present

## 2018-09-02 DIAGNOSIS — N185 Chronic kidney disease, stage 5: Secondary | ICD-10-CM | POA: Diagnosis not present

## 2018-10-02 DIAGNOSIS — N185 Chronic kidney disease, stage 5: Secondary | ICD-10-CM | POA: Diagnosis not present

## 2018-10-03 ENCOUNTER — Other Ambulatory Visit: Payer: Self-pay | Admitting: Internal Medicine

## 2018-10-03 ENCOUNTER — Other Ambulatory Visit: Payer: Self-pay | Admitting: Infectious Disease

## 2018-10-03 DIAGNOSIS — E785 Hyperlipidemia, unspecified: Secondary | ICD-10-CM

## 2018-10-03 DIAGNOSIS — J069 Acute upper respiratory infection, unspecified: Secondary | ICD-10-CM

## 2018-10-08 ENCOUNTER — Other Ambulatory Visit: Payer: Self-pay | Admitting: Infectious Disease

## 2018-10-08 DIAGNOSIS — B20 Human immunodeficiency virus [HIV] disease: Secondary | ICD-10-CM

## 2018-10-09 ENCOUNTER — Other Ambulatory Visit: Payer: Self-pay

## 2018-10-09 DIAGNOSIS — B20 Human immunodeficiency virus [HIV] disease: Secondary | ICD-10-CM

## 2018-10-09 MED ORDER — DOLUTEGRAVIR SODIUM 50 MG PO TABS: 50.0000 mg | ORAL_TABLET | Freq: Every day | ORAL | 5 refills | Status: DC

## 2018-10-09 MED ORDER — LAMIVUDINE 100 MG PO TABS: 100.0000 mg | ORAL_TABLET | Freq: Every day | ORAL | 5 refills | Status: DC

## 2018-10-09 NOTE — Progress Notes (Signed)
Regimen Clarified per Terri Piedra, NP dolutegravir and lamivudine. Refill request sent to pharmacy

## 2018-10-14 ENCOUNTER — Other Ambulatory Visit: Payer: Self-pay | Admitting: Internal Medicine

## 2018-10-14 DIAGNOSIS — E785 Hyperlipidemia, unspecified: Secondary | ICD-10-CM

## 2018-10-22 ENCOUNTER — Other Ambulatory Visit: Payer: Medicare Other

## 2018-10-22 DIAGNOSIS — B20 Human immunodeficiency virus [HIV] disease: Secondary | ICD-10-CM | POA: Diagnosis not present

## 2018-10-23 LAB — T-HELPER CELL (CD4) - (RCID CLINIC ONLY)
CD4 % Helper T Cell: 22 % — ABNORMAL LOW (ref 33–55)
CD4 T CELL ABS: 190 /uL — AB (ref 400–2700)

## 2018-10-24 LAB — HIV-1 RNA QUANT-NO REFLEX-BLD
HIV 1 RNA Quant: <20 copies/mL — AB
HIV-1 RNA Quant, Log: <1.3 Log copies/mL — AB

## 2018-10-24 LAB — RPR: RPR: REACTIVE — AB

## 2018-10-24 LAB — FLUORESCENT TREPONEMAL AB(FTA)-IGG-BLD: Fluorescent Treponemal ABS: REACTIVE — AB

## 2018-10-24 LAB — COMPREHENSIVE METABOLIC PANEL
AG RATIO: 1.3 (calc) (ref 1.0–2.5)
ALT: 12 U/L (ref 6–29)
AST: 19 U/L (ref 10–35)
Albumin: 3.8 g/dL (ref 3.6–5.1)
Alkaline phosphatase (APISO): 86 U/L (ref 33–130)
BUN / CREAT RATIO: 11 (calc) (ref 6–22)
BUN: 42 mg/dL — ABNORMAL HIGH (ref 7–25)
CO2: 20 mmol/L (ref 20–32)
Calcium: 9 mg/dL (ref 8.6–10.4)
Chloride: 104 mmol/L (ref 98–110)
Creat: 3.71 mg/dL — ABNORMAL HIGH (ref 0.60–0.93)
GLUCOSE: 129 mg/dL — AB (ref 65–99)
Globulin: 3 g/dL (calc) (ref 1.9–3.7)
Potassium: 3.6 mmol/L (ref 3.5–5.3)
Sodium: 138 mmol/L (ref 135–146)
Total Bilirubin: 0.4 mg/dL (ref 0.2–1.2)
Total Protein: 6.8 g/dL (ref 6.1–8.1)

## 2018-10-24 LAB — RPR TITER: RPR Titer: 1:2 {titer} — ABNORMAL HIGH

## 2018-10-24 LAB — LIPID PANEL
CHOL/HDL RATIO: 2.8 (calc) (ref <5.0)
CHOLESTEROL: 150 mg/dL (ref <200)
HDL: 53 mg/dL (ref >50)
LDL CHOLESTEROL (CALC): 76 mg/dL
Non-HDL Cholesterol (Calc): 97 mg/dL (calc) (ref <130)
TRIGLYCERIDES: 125 mg/dL (ref <150)

## 2018-10-24 LAB — CBC
HCT: 31.5 % — ABNORMAL LOW (ref 35.0–45.0)
Hemoglobin: 10.4 g/dL — ABNORMAL LOW (ref 11.7–15.5)
MCH: 29.1 pg (ref 27.0–33.0)
MCHC: 33 g/dL (ref 32.0–36.0)
MCV: 88.2 fL (ref 80.0–100.0)
MPV: 10.5 fL (ref 7.5–12.5)
PLATELETS: 167 10*3/uL (ref 140–400)
RBC: 3.57 10*6/uL — AB (ref 3.80–5.10)
RDW: 13.9 % (ref 11.0–15.0)
WBC: 6.7 10*3/uL (ref 3.8–10.8)

## 2018-11-05 ENCOUNTER — Encounter: Payer: Self-pay | Admitting: Infectious Disease

## 2018-11-05 ENCOUNTER — Ambulatory Visit (INDEPENDENT_AMBULATORY_CARE_PROVIDER_SITE_OTHER): Payer: Medicare Other | Admitting: Infectious Disease

## 2018-11-05 VITALS — BP 128/71 | HR 76 | Wt 142.0 lb

## 2018-11-05 DIAGNOSIS — Z79899 Other long term (current) drug therapy: Secondary | ICD-10-CM | POA: Diagnosis not present

## 2018-11-05 DIAGNOSIS — Z940 Kidney transplant status: Secondary | ICD-10-CM | POA: Diagnosis not present

## 2018-11-05 DIAGNOSIS — E785 Hyperlipidemia, unspecified: Secondary | ICD-10-CM | POA: Diagnosis not present

## 2018-11-05 DIAGNOSIS — Z794 Long term (current) use of insulin: Secondary | ICD-10-CM

## 2018-11-05 DIAGNOSIS — E1129 Type 2 diabetes mellitus with other diabetic kidney complication: Secondary | ICD-10-CM

## 2018-11-05 DIAGNOSIS — K219 Gastro-esophageal reflux disease without esophagitis: Secondary | ICD-10-CM | POA: Diagnosis not present

## 2018-11-05 DIAGNOSIS — Z8619 Personal history of other infectious and parasitic diseases: Secondary | ICD-10-CM | POA: Diagnosis not present

## 2018-11-05 DIAGNOSIS — B20 Human immunodeficiency virus [HIV] disease: Secondary | ICD-10-CM

## 2018-11-05 MED ORDER — DOLUTEGRAVIR SODIUM 50 MG PO TABS: 50.0000 mg | ORAL_TABLET | Freq: Every day | ORAL | 11 refills | Status: AC

## 2018-11-05 MED ORDER — LAMIVUDINE 100 MG PO TABS: 100.0000 mg | ORAL_TABLET | Freq: Every day | ORAL | 11 refills | Status: AC

## 2018-11-05 NOTE — Progress Notes (Signed)
Chief complaint: No chief complaints today.  Subjective:    Patient ID: [name], female    DOB: [birth year], 72 y.o.   MRN: [id number]  HPI  72 year-old woman with past medical history is significant for HIV infection, hypertension, diabetes mellitus type II, sp renal transplant at [facility]. She has maintained perfect virological suppression though she has had worsening renal fx that has caused use to readjust her ARVs.  We eventually opted to change her to Tivicay, Rilpivirine and lamivudine.   She had done extremely well with her virological suppression but in prior months admitted to missing 50% of her meds due to falling asleep at night.  Surprisingly her VL was s<20 after this  I told her that this level of adherence was recipe for certain virological failure over the long run.  A in reviewing her medications it turns out that she has been on high-dose ranitidine for the past 6 months to a year which she absolutely should not of been on due to the fact that ranitidine and omeprazole because reduced levels of ropivacaine.  I also explicitly put these drugs into her allergies and is contraindicated medications but despite this she has been it prescribed H2 blocking agents by her primary care.  Fortunately for Jenasia the dolutegravir that she is taking likely protected her thumb failing with resistance which would have assuredly happened if she was for example on something like ODEFSEY.  I planned on changing her to a 2 drug regimen in the form of TIVICAY and ranitidine.  I would like to do the single tablet regimen DOVATA  but this is not yet covered by her insurance.  Bring her back in 1 month's time to make sure she is suppressed on 2 drug regimen she is effectively been on a 2 drug regimen due to the fact that she has been prescribed high-dose H2 blockers.  We have now changed to [place] and Descovy.  Past Medical History:  Diagnosis Date  . CHF (congestive heart failure)  ([place])   . Chronic kidney disease    ESRD secondary to DM, started HD in 2008, Dr. Jimmy Footman is her nephrologist, received renal transplant in 2013, no longer on dialysis (06/17/2018)  . Dialysis patient [facility])    T Th Sat  . Early cataracts, bilateral   . GERD (gastroesophageal reflux disease)   . Hepatitis C    untreated. VL 3700000 in 2008; pt reports this has been treated" (06/17/2018)  . High cholesterol   . History of blood transfusion 2013   "when I got the kidney" (06/17/2018)  . History of hepatitis C 08/04/2015  . HIV infection ([place]) 1998   Dx in 1998 in [place], she presented with PCP pneumonia at that time. Has been tried on multiple regimens by her PCP in [place] before./ She has been on current ART for years now. Moved to [facility] in 2008 and is following with Dr. Tommy Medal since then.   . Hypertension   . 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 PONV (postoperative nausea and vomiting)   . Type II diabetes mellitus ([place])   . Wears glasses     Past Surgical History:  Procedure Laterality Date  . ABDOMINAL HYSTERECTOMY     and oopherectomy for fibroids  . BASCILIC VEIN TRANSPOSITION Left 01/29/2018   Procedure: RIGHT BRACIOCEPHALIC AV FISTULA;  Surgeon: Angelia Mould, MD;  Location: [place];  Service: Vascular;  Laterality: Left;  . CHOLECYSTECTOMY OPEN    .  KIDNEY TRANSPLANT  2013    Family History  Problem Relation Age of Onset  . Diabetes Mother   . Cancer Father   . Cancer Brother       Social History   Socioeconomic History  . Marital status: Single    Spouse name: Not on file  . Number of children: Not on file  . Years of education: Not on file  . Highest education level: Not on file  Occupational History  . Not on file  Social Needs  . Financial resource strain: Not on file  . Food insecurity:    Worry: Not on file    Inability: Not on file  . Transportation needs:    Medical: Not on file    Non-medical: Not on file  Tobacco Use  . Smoking  status: Former Smoker    Packs/day: 0.12    Years: 33.00    Pack years: 3.96    Types: Cigarettes    Last attempt to quit: 05/22/1997    Years since quitting: 21.4  . Smokeless tobacco: Never Used  Substance and Sexual Activity  . Alcohol use: Not Currently    Alcohol/week: 0.0 standard drinks  . Drug use: Never  . Sexual activity: Not Currently  Lifestyle  . Physical activity:    Days per week: Not on file    Minutes per session: Not on file  . Stress: Not on file  Relationships  . Social connections:    Talks on phone: Not on file    Gets together: Not on file    Attends religious service: Not on file    Active member of club or organization: Not on file    Attends meetings of clubs or organizations: Not on file    Relationship status: Not on file  Other Topics Concern  . Not on file  Social History Narrative  . Not on file    Allergies  Allergen Reactions  . Omeprazole Other (See Comments)    Interferes with the absorption of rilipivirine  . Influenza Virus Vacc Split Pf Other (See Comments)    UNSPECIFIED REACTION  Alleged severe illness in [place]. Do not have documentation from [place] but pt always refuses vaccine on these grounds  . Influenza Vaccines Other (See Comments)    Other reaction(s): Hallucination     Current Outpatient Medications:  .  amLODipine (NORVASC) 10 MG tablet, Take 1 tablet (10 mg total) by mouth daily., Disp: 90 tablet, Rfl: 3 .  aspirin 81 MG EC tablet, Take 81 mg by mouth daily.  , Disp: , Rfl:  .  calcitRIOL (ROCALTROL) 0.25 MCG capsule, Take 0.25 mcg by mouth daily., Disp: , Rfl: 6 .  calcium carbonate (TUMS - DOSED IN MG ELEMENTAL CALCIUM) 500 MG chewable tablet, Chew 1 tablet by mouth daily as needed for indigestion or heartburn., Disp: , Rfl:  .  carvedilol (COREG) 25 MG tablet, Take 1 tablet (25 mg total) by mouth 2 (two) times daily with a meal., Disp: 60 tablet, Rfl: 11 .  cinacalcet (SENSIPAR) 60 MG tablet, Take 60 mg by mouth  daily., Disp: , Rfl:  .  diclofenac sodium (VOLTAREN) 1 % GEL, Apply 2 g topically 4 (four) times daily., Disp: 100 g, Rfl: 2 .  dolutegravir (TIVICAY) 50 MG tablet, Take 1 tablet (50 mg total) by mouth daily., Disp: 30 tablet, Rfl: 5 .  fluticasone (FLONASE) 50 MCG/ACT nasal spray, SHAKE LIQUID WELL AND USE 1 SPRAY IN EACH NOSTRIL DAILY (Patient  taking differently: Use 1 spray in each nostril daily as needed for allergies), Disp: 16 g, Rfl: 0 .  FREESTYLE TEST STRIPS test strip, Use with glucometer, Disp: 100 each, Rfl: 9 .  furosemide (LASIX) 80 MG tablet, Take 80 mg by mouth 2 (two) times daily., Disp: , Rfl: 6 .  Insulin Degludec-Liraglutide (XULTOPHY) 100-3.6 UNIT-MG/ML SOPN, Inject 16 Units into the skin every morning., Disp: 4 pen, Rfl: 4 .  Insulin Pen Needle 32G X 4 MM MISC, 16 Units by Does not apply route daily., Disp: 100 each, Rfl: 2 .  lamivudine (EPIVIR) 100 MG tablet, Take 1 tablet (100 mg total) by mouth daily., Disp: 30 tablet, Rfl: 5 .  Lancets (FREESTYLE) lancets, Use as instructed, Disp: 100 each, Rfl: 12 .  magnesium oxide (MAG-OX) 400 MG tablet, Take 400 mg by mouth daily., Disp: , Rfl:  .  mycophenolate (MYFORTIC) 180 MG EC tablet, Take 540 mg by mouth 2 (two) times daily., Disp: , Rfl:  .  oxyCODONE-acetaminophen (PERCOCET) 5-325 MG tablet, Take 1 tablet by mouth every 6 (six) hours as needed for severe pain., Disp: 8 tablet, Rfl: 0 .  potassium chloride SA (K-DUR,KLOR-CON) 20 MEQ tablet, Take 20 mEq by mouth daily., Disp: , Rfl: 10 .  predniSONE (DELTASONE) 5 MG tablet, Take 5 mg by mouth daily with breakfast., Disp: , Rfl:  .  rosuvastatin (CRESTOR) 20 MG tablet, Take 20 mg by mouth daily., Disp: , Rfl: 3 .  rosuvastatin (CRESTOR) 20 MG tablet, TAKE 1 TABLET(20 MG) BY MOUTH DAILY, Disp: 90 tablet, Rfl: 0 .  tacrolimus (PROGRAF) 1 MG capsule, Take 3-4 mg by mouth See admin instructions. Take 4 mg by mouth in the morning and take 3 mg by mouth in the evening, Disp: , Rfl:   .  loratadine (CLARITIN) 10 MG tablet, Take 1 tablet (10 mg total) by mouth daily. (Patient not taking: Reported on 07/19/2018), Disp: 30 tablet, Rfl: 2     Review of Systems  Constitutional: Negative for activity change, appetite change, chills, diaphoresis, fatigue, fever and unexpected weight change.  HENT: Negative for congestion, rhinorrhea, sinus pressure, sneezing, sore throat and trouble swallowing.   Eyes: Negative for photophobia and visual disturbance.  Respiratory: Negative for cough, chest tightness, shortness of breath, wheezing and stridor.   Cardiovascular: Negative for chest pain, palpitations and leg swelling.  Gastrointestinal: Negative for abdominal distention, anal bleeding, blood in stool, constipation, diarrhea, nausea and vomiting.  Genitourinary: Negative for difficulty urinating, dysuria and hematuria.  Musculoskeletal: Negative for back pain, gait problem, joint swelling and myalgias.  Skin: Negative for color change, pallor, rash and wound.  Neurological: Negative for dizziness, tremors, weakness and light-headedness.  Hematological: Negative for adenopathy. Does not bruise/bleed easily.  Psychiatric/Behavioral: Negative for agitation, behavioral problems, confusion, decreased concentration and dysphoric mood.   + for indigestion     Objective:   Physical Exam  Constitutional: She is oriented to person, place, and time. She appears well-developed and well-nourished. No distress.  HENT:  Head: Normocephalic and atraumatic.  Mouth/Throat: Oropharynx is clear and moist. No oropharyngeal exudate.  Eyes: Conjunctivae and EOM are normal. No scleral icterus.  Neck: Normal range of motion. Neck supple. No JVD present.  Cardiovascular: Normal rate, regular rhythm and normal heart sounds.  Pulmonary/Chest: Effort normal. No respiratory distress. She has no wheezes.  Abdominal: She exhibits no distension.  Musculoskeletal: She exhibits no edema or tenderness.   Lymphadenopathy:    She has no cervical adenopathy.  Neurological: She  is alert and oriented to person, place, and time. Coordination normal.  Skin: Skin is warm and dry. She is not diaphoretic. No erythema. No pallor.  Psychiatric: She has a normal mood and affect. Her behavior is normal. Judgment and thought content normal.          Assessment & Plan:   HIV: Continue DTG and 3TC  DM: being followed by Endocrine, and PCP    HTN: BP abetter controlled Vitals:   11/05/18 1031  BP: 128/71  Pulse: 76     Vitals:   11/05/18 1031  BP: 128/71  Pulse: 76    CKD is renal transplantation: Sure creatinine is relatively stable and she is being followed by Dr. Jimmy Footman  GERD: start PPI ,   I spent greater than 25 minutes with the patient including greater than 50% of time in face to face counsel of the patient correctly take her medications medications are contraindicated at the same time as her medication such as taken calcium magnesium or iron with her antiretrovirals on empty stomach that should not be done at all. And in coordination of her care.  She refused influenza vaccination. Lavell Islam. Tommy Medal, [facility]

## 2018-11-19 DIAGNOSIS — E1122 Type 2 diabetes mellitus with diabetic chronic kidney disease: Secondary | ICD-10-CM | POA: Diagnosis not present

## 2018-11-19 DIAGNOSIS — E118 Type 2 diabetes mellitus with unspecified complications: Secondary | ICD-10-CM | POA: Diagnosis not present

## 2018-11-19 DIAGNOSIS — B20 Human immunodeficiency virus [HIV] disease: Secondary | ICD-10-CM | POA: Diagnosis not present

## 2018-11-19 DIAGNOSIS — N2581 Secondary hyperparathyroidism of renal origin: Secondary | ICD-10-CM | POA: Diagnosis not present

## 2018-11-19 DIAGNOSIS — E669 Obesity, unspecified: Secondary | ICD-10-CM | POA: Diagnosis not present

## 2018-11-19 DIAGNOSIS — Z940 Kidney transplant status: Secondary | ICD-10-CM | POA: Diagnosis not present

## 2018-11-19 DIAGNOSIS — N185 Chronic kidney disease, stage 5: Secondary | ICD-10-CM | POA: Diagnosis not present

## 2018-11-19 DIAGNOSIS — R319 Hematuria, unspecified: Secondary | ICD-10-CM | POA: Diagnosis not present

## 2018-11-19 DIAGNOSIS — I120 Hypertensive chronic kidney disease with stage 5 chronic kidney disease or end stage renal disease: Secondary | ICD-10-CM | POA: Diagnosis not present

## 2018-11-19 DIAGNOSIS — R809 Proteinuria, unspecified: Secondary | ICD-10-CM | POA: Diagnosis not present

## 2018-11-19 DIAGNOSIS — D631 Anemia in chronic kidney disease: Secondary | ICD-10-CM | POA: Diagnosis not present

## 2019-01-11 ENCOUNTER — Other Ambulatory Visit: Payer: Self-pay | Admitting: Internal Medicine

## 2019-01-11 DIAGNOSIS — E785 Hyperlipidemia, unspecified: Secondary | ICD-10-CM

## 2019-01-13 DIAGNOSIS — I120 Hypertensive chronic kidney disease with stage 5 chronic kidney disease or end stage renal disease: Secondary | ICD-10-CM | POA: Diagnosis not present

## 2019-01-13 DIAGNOSIS — Z940 Kidney transplant status: Secondary | ICD-10-CM | POA: Diagnosis not present

## 2019-01-13 DIAGNOSIS — N185 Chronic kidney disease, stage 5: Secondary | ICD-10-CM | POA: Diagnosis not present

## 2019-01-13 DIAGNOSIS — N2581 Secondary hyperparathyroidism of renal origin: Secondary | ICD-10-CM | POA: Diagnosis not present

## 2019-01-13 DIAGNOSIS — E118 Type 2 diabetes mellitus with unspecified complications: Secondary | ICD-10-CM | POA: Diagnosis not present

## 2019-01-13 DIAGNOSIS — D631 Anemia in chronic kidney disease: Secondary | ICD-10-CM | POA: Diagnosis not present

## 2019-01-13 DIAGNOSIS — R809 Proteinuria, unspecified: Secondary | ICD-10-CM | POA: Diagnosis not present

## 2019-01-13 DIAGNOSIS — E782 Mixed hyperlipidemia: Secondary | ICD-10-CM | POA: Diagnosis not present

## 2019-01-13 DIAGNOSIS — E1122 Type 2 diabetes mellitus with diabetic chronic kidney disease: Secondary | ICD-10-CM | POA: Diagnosis not present

## 2019-01-13 DIAGNOSIS — B182 Chronic viral hepatitis C: Secondary | ICD-10-CM | POA: Diagnosis not present

## 2019-01-13 DIAGNOSIS — R319 Hematuria, unspecified: Secondary | ICD-10-CM | POA: Diagnosis not present

## 2019-01-24 ENCOUNTER — Ambulatory Visit: Payer: Medicare Other | Admitting: Podiatry

## 2019-02-03 DIAGNOSIS — Z940 Kidney transplant status: Secondary | ICD-10-CM | POA: Diagnosis not present

## 2019-02-07 ENCOUNTER — Encounter: Payer: Self-pay | Admitting: Podiatry

## 2019-02-07 ENCOUNTER — Ambulatory Visit (INDEPENDENT_AMBULATORY_CARE_PROVIDER_SITE_OTHER): Payer: Medicare Other | Admitting: Podiatry

## 2019-02-07 DIAGNOSIS — B351 Tinea unguium: Secondary | ICD-10-CM | POA: Diagnosis not present

## 2019-02-07 DIAGNOSIS — M79675 Pain in left toe(s): Secondary | ICD-10-CM

## 2019-02-07 DIAGNOSIS — M79674 Pain in right toe(s): Secondary | ICD-10-CM | POA: Diagnosis not present

## 2019-02-07 DIAGNOSIS — L608 Other nail disorders: Secondary | ICD-10-CM

## 2019-02-07 DIAGNOSIS — E119 Type 2 diabetes mellitus without complications: Secondary | ICD-10-CM

## 2019-02-07 NOTE — Progress Notes (Signed)

## 2019-02-08 IMAGING — MR MR HEAD W/O CM
9 of 11 series · 33 of 48 positions shown · non-contrast
Comparison: None.

CLINICAL DATA: Dizziness and right-sided blurred vision with
headache over the last week.

EXAM:
MRI HEAD WITHOUT CONTRAST
TECHNIQUE: Multiplanar, multiecho pulse sequences of the brain and surrounding
structures were obtained without intravenous contrast.

[Series 2: FLAIR · sagittal · 5.0mm · 0.47mm/px · 2 of 23 slices shown (1 of 2)]
[im 1/23]
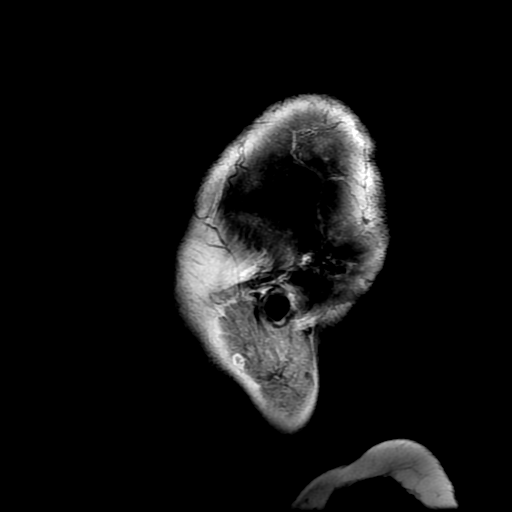
[im 23/23]
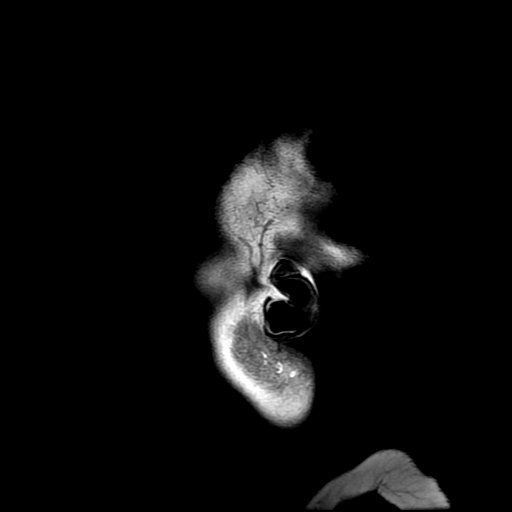

[Series 4: DWI · axial · 3.0mm · 0.94mm/px · z∈[-66,+75]mm · 8 of 96 slices shown (1 of 2)]
[im 1/96]
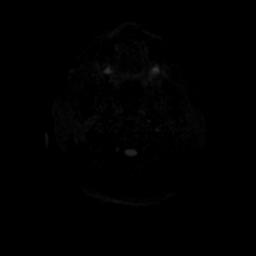
[im 14/96]
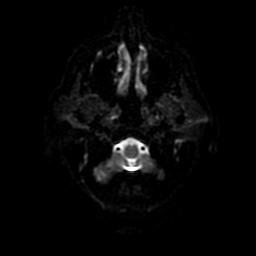
[im 28/96]
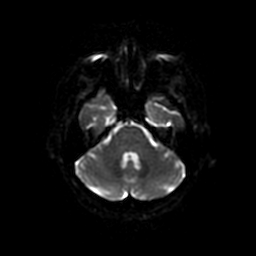
[im 41/96]
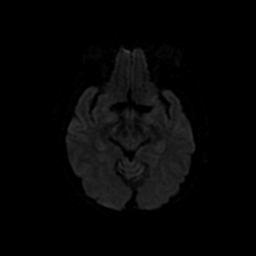
[im 55/96]
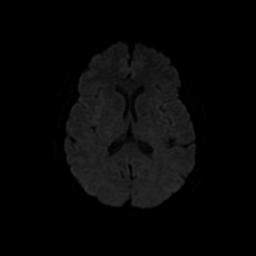
[im 68/96]
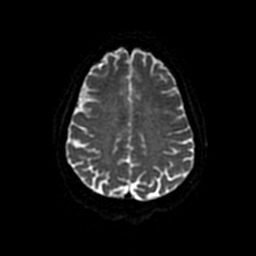
[im 82/96]
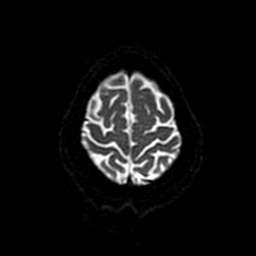
[im 96/96]
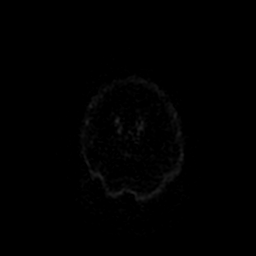

[Series 5: T2 · axial · 5.0mm · 0.47mm/px · z∈[-76,+79]mm · 2 of 27 slices shown (1 of 2)]
[im 1/27]
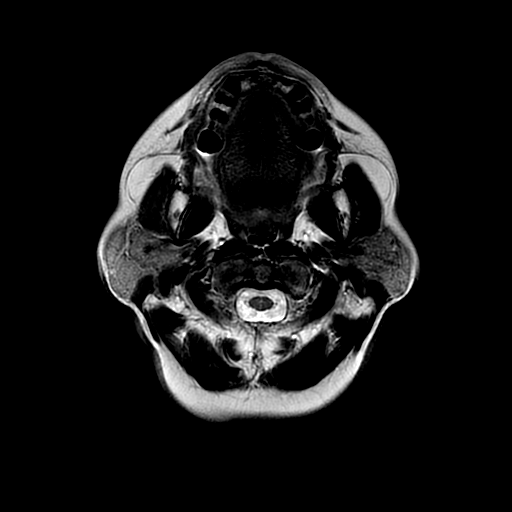
[im 27/27]
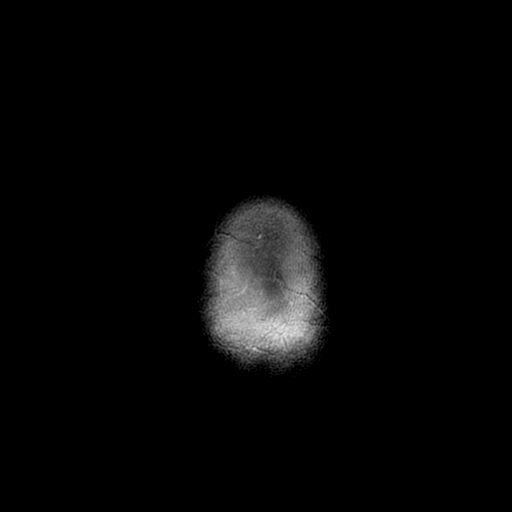

[Series 6: FLAIR · axial · 4.0mm · 0.47mm/px · z∈[-69,+78]mm · 2 of 28 slices shown (2 of 2)]
[im 1/28]
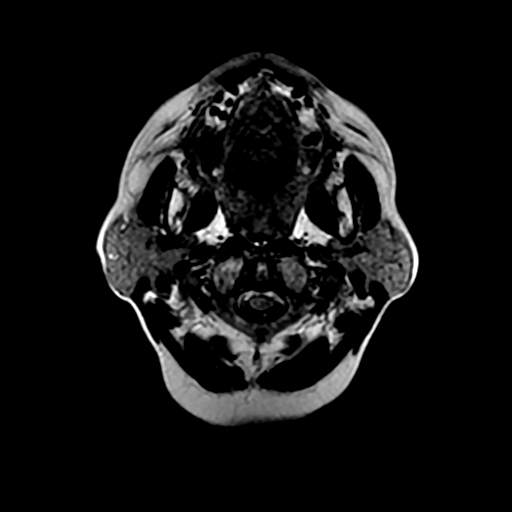
[im 28/28]
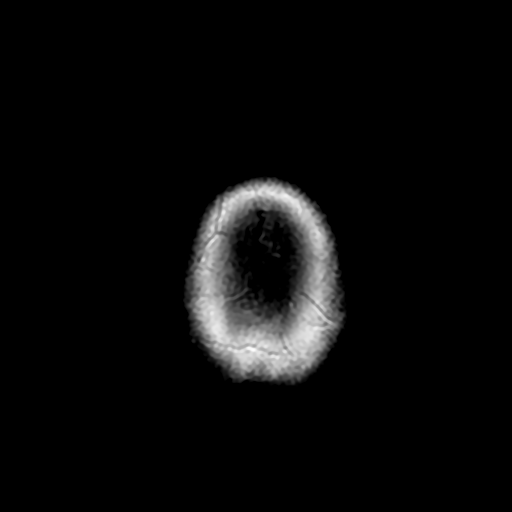

[Series 7: DWI · coronal · 4.0mm · 0.94mm/px · 5 of 66 slices shown (2 of 2)]
[im 1/66]
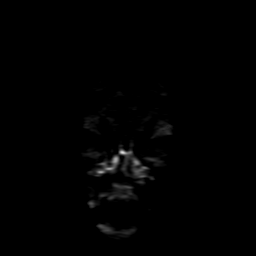
[im 17/66]
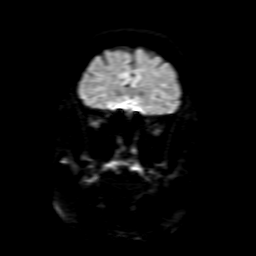
[im 33/66]
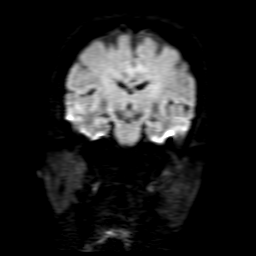
[im 49/66]
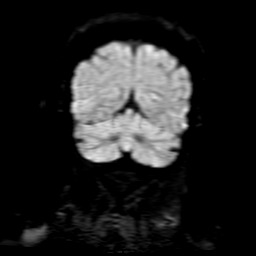
[im 66/66]
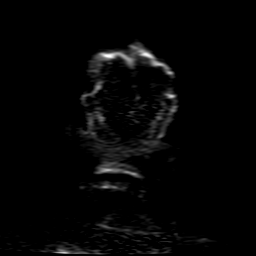

[Series 8: (person_name) · axial · 3.0mm · 0.47mm/px · z∈[-68,+12]mm · 5 of 96 slices shown]
[im 1/96]
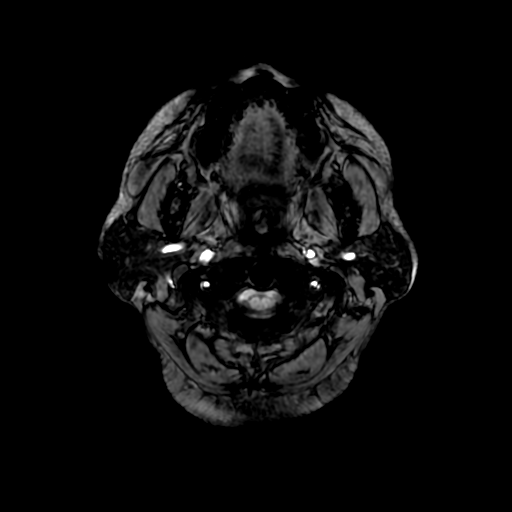
[im 14/96]
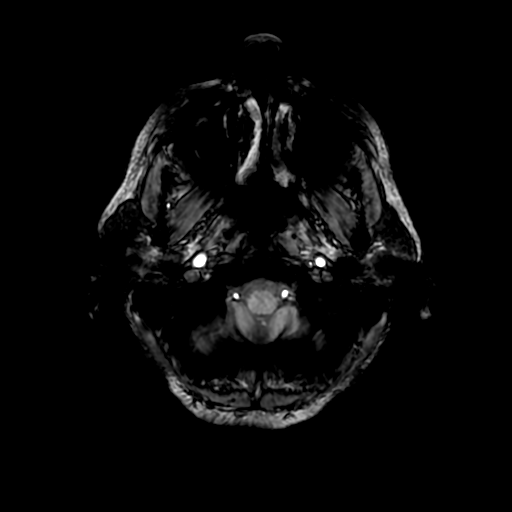
[im 28/96]
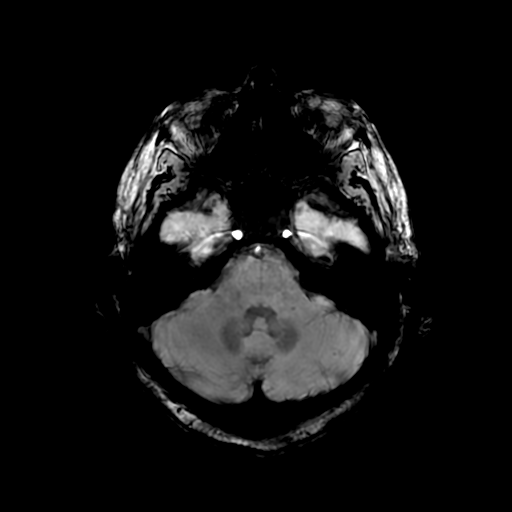
[im 41/96]
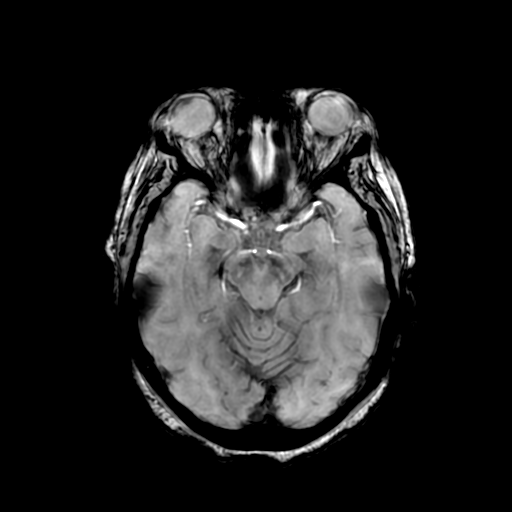
[im 55/96]
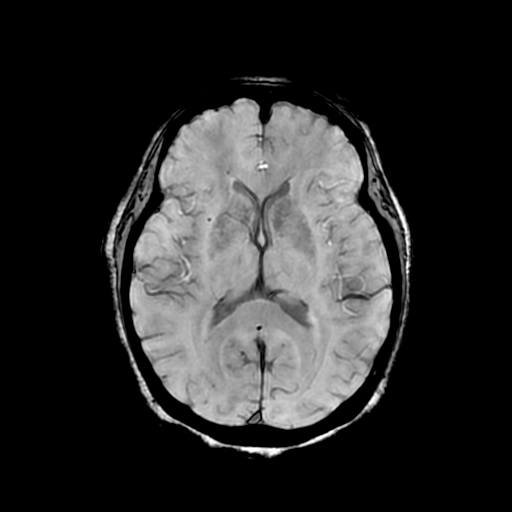

[Series 10: T2 · coronal · 5.0mm · 0.47mm/px · 2 of 28 slices shown (2 of 2)]
[im 1/28]
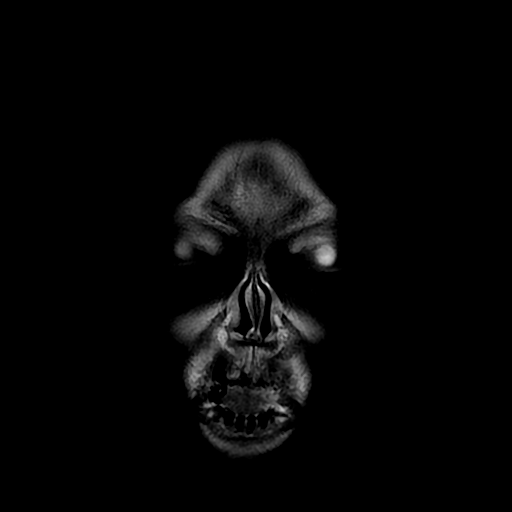
[im 28/28]
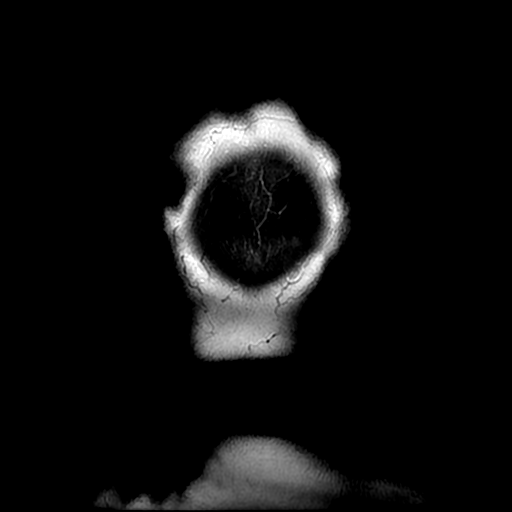

[Series 450: ADC · axial · 3.0mm · 0.94mm/px · z∈[-66,+75]mm · 4 of 48 slices shown (1 of 2)]
[im 1/48]
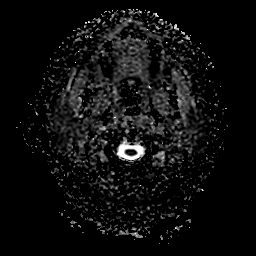
[im 16/48]
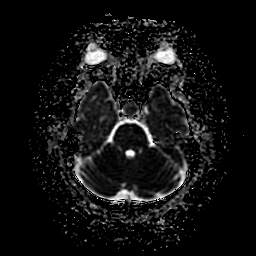
[im 32/48]
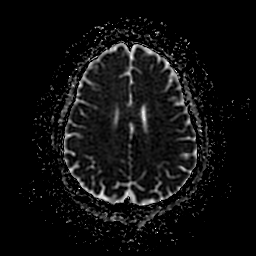
[im 48/48]
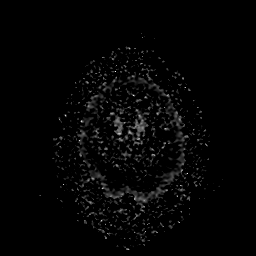

[Series 750: ADC · coronal · 4.0mm · 0.94mm/px · 3 of 33 slices shown (2 of 2)]
[im 1/33]
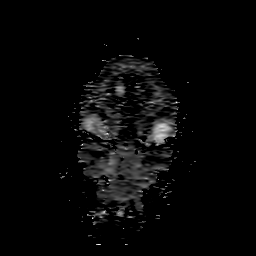
[im 17/33]
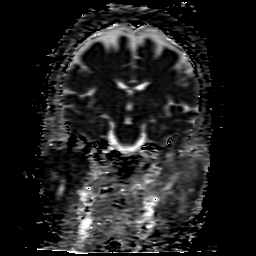
[im 33/33]
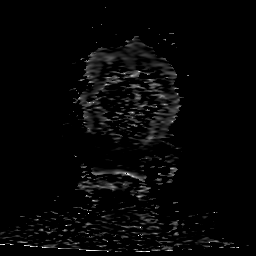

[33 of 48 positions shown; findings below may reference images not displayed]

FINDINGS: Brain: The brain has normal appearance without evidence of
malformation, atrophy, old or acute small or large vessel
infarction, hemorrhage, hydrocephalus or extra-axial collection. No
pituitary abnormality.

Vascular: Major vessels at the base of the brain show flow.

Skull and upper cervical spine: Normal

Sinuses/Orbits: Clear/ normal.

Other: None significant.
IMPRESSION: Normal examination. No cause of the presenting symptoms is
identified.

These results will be called to the ordering clinician or
representative by the Radiologist Assistant, and communication
documented in the PACS or zVision Dashboard.

## 2019-02-10 ENCOUNTER — Other Ambulatory Visit: Payer: Self-pay | Admitting: Internal Medicine

## 2019-02-10 DIAGNOSIS — E1129 Type 2 diabetes mellitus with other diabetic kidney complication: Secondary | ICD-10-CM

## 2019-02-10 DIAGNOSIS — Z794 Long term (current) use of insulin: Principal | ICD-10-CM

## 2019-02-13 DIAGNOSIS — Z940 Kidney transplant status: Secondary | ICD-10-CM | POA: Diagnosis not present

## 2019-03-03 ENCOUNTER — Telehealth: Payer: Self-pay | Admitting: *Deleted

## 2019-03-03 NOTE — Telephone Encounter (Signed)
Patient called in, states 2 days ago she began with H/A (rated 10/10), night sweats and body aches. Temp yesterday and today has been 99.3. Today rates H/A 2/10 and body aches are gone. Has infrequent non productive cough. Denies sore throat, SHOB, chest pain. Has no recent travel, has not been around anyone who has travelled and no sick contacts. Patient has no change in appetite; eating and drinking same as usual. Patient encouraged to get plenty of rest and drink plenty of fluids. States she has been drinking juice. Encouraged to dilute juice with 50% water or just drink water so as not to elevate blood sugars with hx of diabetes. Will route to PCP to see if there are any other recommendations. Hubbard Hartshorn, RN, BSN

## 2019-03-04 ENCOUNTER — Telehealth: Payer: Self-pay | Admitting: Dietician

## 2019-03-04 NOTE — Telephone Encounter (Signed)
[name] is a 73 y.o. female who was contacted on behalf of [facility] Geriatrics Task Force.  Left voicemail for return call in 03/04/19   Diabetes Assessment  DM meds and BS checks -  "What medications are you taking for diabetes?" xultophy 16 units once a day -  "How often do you check your blood sugars at home?" once a day - "What have your blood sugars been?" 98-120/120/143- chocolate  High Blood Pressure Assessment  BP meds & BP checks -  "What medications are you taking for high blood pressure?" amlodipine, coreg - "How often do you check your blood pressure at home?" no - "What have your blood pressure readings been?" na  Coping with DM and BP - What else are you doing to help with your DM and BP -  - Diet? Every now and then cookies or chocolate, mostly drinks water, apple juice, some ginger ale at times.  oranges, eats vegetables 1-2x/day. Discussed goal of 5 fruits/veetable servings/day - Exercise? Walks-at least 2-3x/week not long, Discussed goal of 150 minutes a week and eating sweets around time of exercise may prevent increase in her blood sugar that she usually sees with sweet intake    Medication Access Issues  Medication Issues? -  "Are you getting your medicines refilled on time without skipping any doses?" I am3 - "Are you having any problems getting/taking your meds (cost, timing, transportation)?" no - Do you need any meds refilled? no  Conclusion  Close the call - Date of follow-up visit scheduled- April  6th - "Any other questions or concerns?" has been getting indigestion real bad- she requests a prescription for ranitidine rx  Debera Lat, RD 03/05/2019 10:25 AM.

## 2019-03-05 ENCOUNTER — Other Ambulatory Visit: Payer: Self-pay | Admitting: Internal Medicine

## 2019-03-05 MED ORDER — RANITIDINE HCL 75 MG PO TABS: 75.0000 mg | ORAL_TABLET | Freq: Two times a day (BID) | ORAL | 0 refills | Status: DC

## 2019-03-05 NOTE — Telephone Encounter (Signed)
I dis sent a prescription.

## 2019-03-05 NOTE — Telephone Encounter (Signed)
She can take ranitidine, it's over the counter.

## 2019-03-05 NOTE — Telephone Encounter (Signed)
Sounds good, thank you for taking care.

## 2019-03-07 DIAGNOSIS — N185 Chronic kidney disease, stage 5: Secondary | ICD-10-CM | POA: Diagnosis not present

## 2019-03-07 DIAGNOSIS — Z940 Kidney transplant status: Secondary | ICD-10-CM | POA: Diagnosis not present

## 2019-03-10 ENCOUNTER — Other Ambulatory Visit: Payer: Self-pay

## 2019-03-10 ENCOUNTER — Ambulatory Visit (INDEPENDENT_AMBULATORY_CARE_PROVIDER_SITE_OTHER): Payer: Medicare Other | Admitting: Internal Medicine

## 2019-03-10 DIAGNOSIS — Z794 Long term (current) use of insulin: Secondary | ICD-10-CM | POA: Diagnosis not present

## 2019-03-10 DIAGNOSIS — N186 End stage renal disease: Secondary | ICD-10-CM | POA: Diagnosis not present

## 2019-03-10 DIAGNOSIS — E1129 Type 2 diabetes mellitus with other diabetic kidney complication: Secondary | ICD-10-CM

## 2019-03-10 DIAGNOSIS — K219 Gastro-esophageal reflux disease without esophagitis: Secondary | ICD-10-CM

## 2019-03-10 DIAGNOSIS — Z79899 Other long term (current) drug therapy: Secondary | ICD-10-CM

## 2019-03-10 DIAGNOSIS — Z87891 Personal history of nicotine dependence: Secondary | ICD-10-CM | POA: Diagnosis not present

## 2019-03-10 DIAGNOSIS — I120 Hypertensive chronic kidney disease with stage 5 chronic kidney disease or end stage renal disease: Secondary | ICD-10-CM

## 2019-03-10 DIAGNOSIS — I10 Essential (primary) hypertension: Secondary | ICD-10-CM

## 2019-03-10 DIAGNOSIS — Z21 Asymptomatic human immunodeficiency virus [HIV] infection status: Secondary | ICD-10-CM

## 2019-03-10 DIAGNOSIS — E1122 Type 2 diabetes mellitus with diabetic chronic kidney disease: Secondary | ICD-10-CM | POA: Diagnosis not present

## 2019-03-10 MED ORDER — PANTOPRAZOLE SODIUM 20 MG PO TBEC: 20.0000 mg | DELAYED_RELEASE_TABLET | Freq: Every day | ORAL | 3 refills | Status: DC

## 2019-03-10 NOTE — Progress Notes (Signed)
  This is a telephone encounter between [name] and [name] on 03/10/2019 for CC. The visit was conducted with the patient located at home and [name] at [facility]. The patient's identity was confirmed using their DOB and current address. The patient has consented to being evaluated through a telephone encounter and understands the associated risks (an examination cannot be done and the patient may need to come in for an appointment) / benefits (allows the patient to remain at home, decreasing exposure to coronavirus). I personally spent 15 minutes on medical discussion.   CC: Follow-up of her diabetes, hypertension.  HPI:  [name] is a 73 y.o. with past medical history as listed below was given a call to follow-up on her chronic conditions.  Per patient she is doing well now and has no complaints.  Couple of weeks ago she had some fever and body aches which has been resolved.  She denies any cough or shortness of breath. She continued to experience some GERD symptoms and asking for some help.  Please see assessment and plan for her chronic conditions.  Past Medical History:  Diagnosis Date  . CHF (congestive heart failure) ([place])   . Chronic kidney disease    ESRD secondary to DM, started HD in 2008, Dr. Jimmy Footman is her nephrologist, received renal transplant in 2013, no longer on dialysis (06/17/2018)  . Dialysis patient [facility])    T Th Sat  . Early cataracts, bilateral   . GERD (gastroesophageal reflux disease)   . Hepatitis C    untreated. VL 3700000 in 2008; pt reports this has been treated" (06/17/2018)  . High cholesterol   . History of blood transfusion 2013   "when I got the kidney" (06/17/2018)  . History of hepatitis C 08/04/2015  . HIV infection ([place]) 1998   Dx in 1998 in [place], she presented with PCP pneumonia at that time. Has been tried on multiple regimens by her PCP in [place] before./ She has been on current ART for years now. Moved to [facility] in 2008 and is  following with Dr. Tommy Medal since then.   . Hypertension   . 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 PONV (postoperative nausea and vomiting)   . Type II diabetes mellitus ([place])   . Wears glasses    Review of Systems: Negative except mentioned in HPI.  Physical Exam:  There were no vitals filed for this visit. Not performed as this was a tele-visit.  Assessment & Plan:   See Encounters Tab for problem based charting.  Patient discussed with Dr. Lynnae January.

## 2019-03-10 NOTE — Assessment & Plan Note (Signed)
Patient was happy that her blood sugar is well controlled with Xultrophy 16 units daily. Patient her blood sugar remained between 90-132. It was 95 this morning.  -She will continue with current dose and we will reassess during next follow-up visit.

## 2019-03-10 NOTE — Assessment & Plan Note (Signed)
She is experiencing more GERD symptoms and unable to tolerate Pepcid or Tums. Ranitidine was given to her but it is off the market now. Talked with ID and her current HIV meds can be given with PPIs or H2 blockers.  -Give her a prescription of Protonix 20 mg daily. -We will reassess during next follow-up visit.

## 2019-03-10 NOTE — Assessment & Plan Note (Signed)
Patient was feeling better and does not think that her blood pressure is bothering her.  She denies any headaches, nausea, vomiting, chest pain or change in her vision.  She does not check blood pressure at home but take her medications regularly.  She was advised to continue her Coreg, Lasix, and amlodipine.

## 2019-03-12 NOTE — Progress Notes (Signed)
Internal Medicine Clinic Attending  Case discussed with Dr. Reesa Chew soon after the resident saw the patient.  We reviewed the resident's history and exam and pertinent patient test results.  I agree with the assessment, diagnosis, and plan of care documented in the resident's note.

## 2019-03-17 DIAGNOSIS — E669 Obesity, unspecified: Secondary | ICD-10-CM | POA: Diagnosis not present

## 2019-03-17 DIAGNOSIS — E1122 Type 2 diabetes mellitus with diabetic chronic kidney disease: Secondary | ICD-10-CM | POA: Diagnosis not present

## 2019-03-17 DIAGNOSIS — I120 Hypertensive chronic kidney disease with stage 5 chronic kidney disease or end stage renal disease: Secondary | ICD-10-CM | POA: Diagnosis not present

## 2019-03-17 DIAGNOSIS — R319 Hematuria, unspecified: Secondary | ICD-10-CM | POA: Diagnosis not present

## 2019-03-17 DIAGNOSIS — D631 Anemia in chronic kidney disease: Secondary | ICD-10-CM | POA: Diagnosis not present

## 2019-03-17 DIAGNOSIS — Z940 Kidney transplant status: Secondary | ICD-10-CM | POA: Diagnosis not present

## 2019-03-17 DIAGNOSIS — N2581 Secondary hyperparathyroidism of renal origin: Secondary | ICD-10-CM | POA: Diagnosis not present

## 2019-03-17 DIAGNOSIS — B20 Human immunodeficiency virus [HIV] disease: Secondary | ICD-10-CM | POA: Diagnosis not present

## 2019-03-17 DIAGNOSIS — R809 Proteinuria, unspecified: Secondary | ICD-10-CM | POA: Diagnosis not present

## 2019-03-17 DIAGNOSIS — N185 Chronic kidney disease, stage 5: Secondary | ICD-10-CM | POA: Diagnosis not present

## 2019-03-17 DIAGNOSIS — E118 Type 2 diabetes mellitus with unspecified complications: Secondary | ICD-10-CM | POA: Diagnosis not present

## 2019-03-24 ENCOUNTER — Other Ambulatory Visit: Payer: Self-pay | Admitting: Internal Medicine

## 2019-03-24 DIAGNOSIS — I10 Essential (primary) hypertension: Secondary | ICD-10-CM

## 2019-04-16 ENCOUNTER — Other Ambulatory Visit: Payer: Self-pay | Admitting: Internal Medicine

## 2019-04-16 DIAGNOSIS — Z940 Kidney transplant status: Secondary | ICD-10-CM | POA: Diagnosis not present

## 2019-04-16 DIAGNOSIS — M549 Dorsalgia, unspecified: Secondary | ICD-10-CM

## 2019-04-16 DIAGNOSIS — N185 Chronic kidney disease, stage 5: Secondary | ICD-10-CM | POA: Diagnosis not present

## 2019-04-22 ENCOUNTER — Telehealth: Payer: Self-pay

## 2019-04-22 NOTE — Telephone Encounter (Signed)
Unable to reach.

## 2019-04-23 ENCOUNTER — Other Ambulatory Visit: Payer: Self-pay | Admitting: Internal Medicine

## 2019-04-23 DIAGNOSIS — E785 Hyperlipidemia, unspecified: Secondary | ICD-10-CM

## 2019-04-24 ENCOUNTER — Ambulatory Visit (INDEPENDENT_AMBULATORY_CARE_PROVIDER_SITE_OTHER): Payer: Medicare Other | Admitting: Podiatry

## 2019-04-24 ENCOUNTER — Other Ambulatory Visit: Payer: Self-pay

## 2019-04-24 VITALS — Temp 97.9°F

## 2019-04-24 DIAGNOSIS — M79676 Pain in unspecified toe(s): Secondary | ICD-10-CM

## 2019-04-24 DIAGNOSIS — L600 Ingrowing nail: Secondary | ICD-10-CM

## 2019-04-24 NOTE — Patient Instructions (Signed)

## 2019-04-24 NOTE — Progress Notes (Signed)
Subjective:  Patient ID: [name], female    DOB: [birth year],  MRN: [id number]  Chief Complaint  Patient presents with  . Nail Problem    Pt states left 1st toe is painful. Pt states Dr. Prudence Davidson trimmed her nails and left 1st toe has been painful since, 68mo duration.    73 y.o. female presents with the above complaint. Hx above confirmed with patient.   Review of Systems: Negative except as noted in the HPI. Denies N/V/F/Ch.  Past Medical History:  Diagnosis Date  . CHF (congestive heart failure) ([place])   . Chronic kidney disease    ESRD secondary to DM, started HD in 2008, Dr. Jimmy Footman is her nephrologist, received renal transplant in 2013, no longer on dialysis (06/17/2018)  . Dialysis patient [facility])    T Th Sat  . Early cataracts, bilateral   . GERD (gastroesophageal reflux disease)   . Hepatitis C    untreated. VL 3700000 in 2008; pt reports this has been treated" (06/17/2018)  . High cholesterol   . History of blood transfusion 2013   "when I got the kidney" (06/17/2018)  . History of hepatitis C 08/04/2015  . HIV infection ([place]) 1998   Dx in 1998 in [place], she presented with PCP pneumonia at that time. Has been tried on multiple regimens by her PCP in [place] before./ She has been on current ART for years now. Moved to [facility] in 2008 and is following with Dr. Tommy Medal since then.   . Hypertension   . 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 PONV (postoperative nausea and vomiting)   . Type II diabetes mellitus ([place])   . Wears glasses     Current Outpatient Medications:  .  amLODipine (NORVASC) 10 MG tablet, TAKE 1 TABLET(10 MG) BY MOUTH DAILY, Disp: 90 tablet, Rfl: 3 .  aspirin 81 MG EC tablet, Take 81 mg by mouth daily.  , Disp: , Rfl:  .  calcitRIOL (ROCALTROL) 0.25 MCG capsule, Take 0.25 mcg by mouth daily., Disp: , Rfl: 6 .  carvedilol (COREG) 25 MG tablet, Take 1 tablet (25 mg total) by mouth 2 (two) times daily with a meal., Disp: 60 tablet, Rfl: 11 .  cinacalcet  (SENSIPAR) 60 MG tablet, Take 60 mg by mouth daily., Disp: , Rfl:  .  dolutegravir (TIVICAY) 50 MG tablet, Take 1 tablet (50 mg total) by mouth daily., Disp: 30 tablet, Rfl: 11 .  EDURANT 25 MG TABS tablet, , Disp: , Rfl:  .  fluticasone (FLONASE) 50 MCG/ACT nasal spray, SHAKE LIQUID WELL AND USE 1 SPRAY IN EACH NOSTRIL DAILY (Patient taking differently: Use 1 spray in each nostril daily as needed for allergies), Disp: 16 g, Rfl: 0 .  FREESTYLE TEST STRIPS test strip, Use with glucometer, Disp: 100 each, Rfl: 9 .  furosemide (LASIX) 40 MG tablet, TK 1 T PO D, Disp: , Rfl:  .  Insulin Pen Needle 32G X 4 MM MISC, 16 Units by Does not apply route daily., Disp: 100 each, Rfl: 2 .  lamivudine (EPIVIR) 100 MG tablet, Take 1 tablet (100 mg total) by mouth daily., Disp: 30 tablet, Rfl: 11 .  Lancets (FREESTYLE) lancets, Use as instructed, Disp: 100 each, Rfl: 12 .  loratadine (CLARITIN) 10 MG tablet, Take 1 tablet (10 mg total) by mouth daily. (Patient not taking: Reported on 07/19/2018), Disp: 30 tablet, Rfl: 2 .  magnesium oxide (MAG-OX) 400 MG tablet, Take 400 mg by mouth daily., Disp: , Rfl:  .  Magnesium Oxide 400 (240 Mg) MG TABS, TK 1 T PO BID, Disp: , Rfl: 6 .  mycophenolate (MYFORTIC) 180 MG EC tablet, Take 540 mg by mouth 2 (two) times daily., Disp: , Rfl:  .  pantoprazole (PROTONIX) 20 MG tablet, Take 1 tablet (20 mg total) by mouth daily., Disp: 30 tablet, Rfl: 3 .  potassium chloride SA (K-DUR,KLOR-CON) 20 MEQ tablet, Take 20 mEq by mouth daily., Disp: , Rfl: 10 .  predniSONE (DELTASONE) 5 MG tablet, Take 5 mg by mouth daily with breakfast., Disp: , Rfl:  .  rosuvastatin (CRESTOR) 20 MG tablet, TAKE 1 TABLET(20 MG) BY MOUTH DAILY, Disp: 90 tablet, Rfl: 0 .  tacrolimus (PROGRAF) 1 MG capsule, Take 3-4 mg by mouth See admin instructions. Take 4 mg by mouth in the morning and take 3 mg by mouth in the evening, Disp: , Rfl:  .  VOLTAREN 1 % GEL, APPLY 2 GRAMS EXTERNALLY TO THE AFFECTED AREA FOUR  TIMES DAILY, Disp: 100 g, Rfl: 2 .  XULTOPHY 100-3.6 UNIT-MG/ML SOPN, INJECT 16 UNDER THE SKIN EVERY MORNING, Disp: 12 mL, Rfl: 3  Social History   Tobacco Use  Smoking Status Former Smoker  . Packs/day: 0.12  . Years: 33.00  . Pack years: 3.96  . Types: Cigarettes  . Last attempt to quit: 05/22/1997  . Years since quitting: 21.9  Smokeless Tobacco Never Used    Allergies  Allergen Reactions  . Omeprazole Other (See Comments)    Interferes with the absorption of rilipivirine  . Influenza Virus Vacc Split Pf Other (See Comments)    UNSPECIFIED REACTION  Alleged severe illness in [place]. Do not have documentation from [place] but pt always refuses vaccine on these grounds  . Influenza Vaccines Other (See Comments)    Other reaction(s): Hallucination   Objective:   Vitals:   04/24/19 1105  Temp: 97.9 F (36.6 C)   There is no height or weight on file to calculate BMI. Constitutional Well developed. Well nourished.  Vascular Dorsalis pedis pulses palpable bilaterally. Posterior tibial pulses palpable bilaterally. Capillary refill normal to all digits.  No cyanosis or clubbing noted. Pedal hair growth normal.  Neurologic Normal speech. Oriented to person, place, and time. Epicritic sensation to light touch grossly present bilaterally.  Dermatologic Painful ingrowing nail at both nail borders of the hallux nail right. No other open wounds. No skin lesions.  Orthopedic: Normal joint ROM without pain or crepitus bilaterally. No visible deformities. No bony tenderness.   Radiographs: None Assessment:   1. Ingrown nail   2. Pain around toenail    Plan:  Patient was evaluated and treated and all questions answered.  Ingrown Nail, right -Patient elects to proceed with minor surgery to remove ingrown toenail removal today. Consent reviewed and signed by patient. -Ingrown nail excised. See procedure note. -Educated on post-procedure care including soaking. Written  instructions provided and reviewed. -Patient to follow up in 2 weeks for nail check.  Procedure: Avulsion of toenail Location: Right 1st toe  Anesthesia: Lidocaine 1% plain; 1.5 mL and Marcaine 0.5% plain; 1.5 mL, digital block. Skin Prep: Betadine. Dressing: Silvadene; telfa; dry, sterile, compression dressing. Technique: Following skin prep, the toe was exsanguinated and a tourniquet was secured at the base of the toe. The nail borders were freed and avulsed with a hemostat. The area was cleansed. The tourniquet was then removed and sterile dressing applied. Disposition: Patient tolerated procedure well.     No follow-ups on file.

## 2019-05-07 ENCOUNTER — Other Ambulatory Visit: Payer: Self-pay

## 2019-05-07 ENCOUNTER — Other Ambulatory Visit: Payer: Medicare Other

## 2019-05-07 DIAGNOSIS — B20 Human immunodeficiency virus [HIV] disease: Secondary | ICD-10-CM

## 2019-05-07 DIAGNOSIS — Z79899 Other long term (current) drug therapy: Secondary | ICD-10-CM | POA: Diagnosis not present

## 2019-05-08 ENCOUNTER — Encounter: Payer: Self-pay | Admitting: Internal Medicine

## 2019-05-08 ENCOUNTER — Ambulatory Visit (INDEPENDENT_AMBULATORY_CARE_PROVIDER_SITE_OTHER): Payer: Medicare Other | Admitting: Podiatry

## 2019-05-08 ENCOUNTER — Telehealth: Payer: Self-pay | Admitting: *Deleted

## 2019-05-08 ENCOUNTER — Ambulatory Visit (INDEPENDENT_AMBULATORY_CARE_PROVIDER_SITE_OTHER): Payer: Medicare Other | Admitting: Internal Medicine

## 2019-05-08 VITALS — Temp 97.7°F

## 2019-05-08 DIAGNOSIS — E119 Type 2 diabetes mellitus without complications: Secondary | ICD-10-CM

## 2019-05-08 DIAGNOSIS — Z7952 Long term (current) use of systemic steroids: Secondary | ICD-10-CM | POA: Diagnosis not present

## 2019-05-08 DIAGNOSIS — Z21 Asymptomatic human immunodeficiency virus [HIV] infection status: Secondary | ICD-10-CM

## 2019-05-08 DIAGNOSIS — Z87891 Personal history of nicotine dependence: Secondary | ICD-10-CM

## 2019-05-08 DIAGNOSIS — Z79899 Other long term (current) drug therapy: Secondary | ICD-10-CM

## 2019-05-08 DIAGNOSIS — R509 Fever, unspecified: Secondary | ICD-10-CM

## 2019-05-08 DIAGNOSIS — M79676 Pain in unspecified toe(s): Secondary | ICD-10-CM

## 2019-05-08 DIAGNOSIS — R079 Chest pain, unspecified: Secondary | ICD-10-CM | POA: Diagnosis not present

## 2019-05-08 DIAGNOSIS — Z940 Kidney transplant status: Secondary | ICD-10-CM | POA: Diagnosis not present

## 2019-05-08 DIAGNOSIS — Z20822 Contact with and (suspected) exposure to covid-19: Secondary | ICD-10-CM

## 2019-05-08 DIAGNOSIS — L600 Ingrowing nail: Secondary | ICD-10-CM

## 2019-05-08 LAB — T-HELPER CELL (CD4) - (RCID CLINIC ONLY)
CD4 % Helper T Cell: 23 % — ABNORMAL LOW (ref 33–65)
CD4 T Cell Abs: 214 /uL — ABNORMAL LOW (ref 400–1790)

## 2019-05-08 MED ORDER — SILVER SULFADIAZINE 1 % EX CREA: TOPICAL_CREAM | CUTANEOUS | 0 refills | Status: DC

## 2019-05-08 NOTE — Assessment & Plan Note (Addendum)
HPI: [name] is a 73 y.o. female with a medical history of renal transplant in 2013 on immunosuppressive therapy (mycophenolate, tacrolimus, and prednisone), HIV on HAART (last CD4 214 yesterday, last viral load undetectable 6 months ago), HCV treated with Harvoni in 2015, insulin-dependent DMII and the other medical conditions listed below. This patient called the internal medicine clinic for a three day history of low grade fevers, primarily occurring at night. She took her temperature this morning and it was 100.8. She also endorses a feeling of heaviness in her chest and back when she coughs and walks. She does not have increased cough or sputum production, dyspnea, n/v/d, sore throat, or loss of appetite. She endorses intermittent night sweats at baseline. She is eating and drinking without issue. The patient lives by herself and she has no known sick contacts, but she has been leaving the house to go to the podiatrist, the grocery store, and to have a weekly dinner with church friends. She has no known covid exposures. She has no history of asthma or COPD. Denies urinary symptoms or skin rashes. Please see problem based charting assessment and plan.  Assessment: Patient will benefit from Covid-19 given her measured fever, chest heaviness, and recent social interaction. She would be a high risk patient if positive for Covid given her immunosuppressed status. She is also at risk for other infection, including bacterial pneumonia. No suspicion of UTI, intra-abdominal infection, or skin infection at this time. If Covid-19 testing is negative, she should be evaluated in-person with consideration of a CXR.  Plan - Covid-19 testing at outpatient facility scheduled for tomorrow morning - Patient advised to self-quarantine at home pending results of this test - If Covid negative, patients should have an in-person appointment and CXR if symptoms are still present  ADDENDUM: Covid-19 test was  negative. I called the patient to inform her of the results. She states she's been afebrile since we last spoke and her pain has improved. She has noticed eye redness and I advised her to try warm compresses and to call the clinic if her symptoms worsen.

## 2019-05-08 NOTE — Telephone Encounter (Signed)
-----   Message from Corinne Ports, MD sent at 05/08/2019  4:26 PM EDT ----- Regarding: Covid testing Hello,  I'm referring [name] for Covid-19 testing based on 3 days of fever, measured at 100.8 this morning, chest heaviness, and immunosuppressed status. She seems clinically stable via televisit and only needs testing and home quarantine at this time.   MRN: [id number] DOB: [birth year] Insurance: [employer]

## 2019-05-08 NOTE — Progress Notes (Addendum)
   This is a telephone encounter between [name] and [name] on 05/08/2019 for fever. The visit was conducted with the patient located at home and [name] at Home. The patient's identity was confirmed using their DOB and current address. The patient has consented to being evaluated through a telephone encounter and understands the associated risks/benefits. I personally spent 12 minutes on medical discussion.   HPI:   [name] is a 73 y.o. female with a medical history of renal transplant in 2013 on immunosuppressive therapy (mycophenolate, tacrolimus, and prednisone), HIV on HAART (last CD4 214 yesterday, last viral load undetectable 6 months ago), HCV treated with Harvoni in 2015, insulin-dependent DMII and the other medical conditions listed below. This patient called the internal medicine clinic for a three day history of low grade fevers, primarily occurring at night. She took her temperature this morning and it was 100.8. She also endorses a feeling of heaviness in her chest and back when she coughs and walks. She does not have increased cough or sputum production, dyspnea, n/v/d, sore throat, or loss of appetite. She endorses intermittent night sweats at baseline. She is eating and drinking without issue. The patient lives by herself and she has no known sick contacts, but she has been leaving the house to go to the podiatrist, the grocery store, and to have a weekly dinner with church friends. She has no known covid exposures. She has no history of asthma or COPD. Denies urinary symptoms or skin rashes. Please see problem based charting assessment and plan.   Past Medical History:  Diagnosis Date  . CHF (congestive heart failure) ([place])   . Chronic kidney disease    ESRD secondary to DM, started HD in 2008, Dr. Jimmy Footman is her nephrologist, received renal transplant in 2013, no longer on dialysis (06/17/2018)  . Dialysis patient [facility])    T Th Sat  . Early  cataracts, bilateral   . GERD (gastroesophageal reflux disease)   . Hepatitis C    untreated. VL 3700000 in 2008; pt reports this has been treated" (06/17/2018)  . High cholesterol   . History of blood transfusion 2013   "when I got the kidney" (06/17/2018)  . History of hepatitis C 08/04/2015  . HIV infection ([place]) 1998   Dx in 1998 in [place], she presented with PCP pneumonia at that time. Has been tried on multiple regimens by her PCP in [place] before./ She has been on current ART for years now. Moved to [facility] in 2008 and is following with Dr. Tommy Medal since then.   . Hypertension   . 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 PONV (postoperative nausea and vomiting)   . Type II diabetes mellitus ([place])   . Wears glasses     Review of Systems:   Pertinent positives mentioned in HPI. Remainder of all ROS negative.   Assessment & Plan:   Patient discussed with Dr. Evette Doffing

## 2019-05-08 NOTE — Telephone Encounter (Signed)
Sending to Dr. Annie Paras for a televisit this afternoon

## 2019-05-08 NOTE — Telephone Encounter (Signed)
Patient called and scheduled for testing at [facility] site on 05/09/19 at 8 am. Pt advised to wear a mask and remain in car at appt time. Understanding verbalized.

## 2019-05-08 NOTE — Progress Notes (Signed)
  Subjective:  Patient ID: [name], female    DOB: 1946/10/18,  MRN: [id number]  Chief Complaint  Patient presents with  . Ingrown Toenail    2 week follow up right great toe -  doing about the same   73 y.o. female returns for the above complaint. States the right great toe is doing ok, still having pain. States that she is soaking as directed.  Objective:   General AA&O x3. Normal mood and affect.  Vascular Foot warm and well perfused with good capillary refill.  Neurologic Sensation grossly intact.  Dermatologic Nail avulsion site right hallux - lateral site appears healed. Medial with some fibrosis, no warmth erythema or signs of infection.  Orthopedic: No tenderness to palpation of the toe.   Assessment & Plan:  Patient was evaluated and treated and all questions answered.  S/p Ingrown Toenail Excision, right -Healing without signs of infection. -Rx Silvadene to be applied daily. -F/u in 2 weeks for recheck.

## 2019-05-08 NOTE — Telephone Encounter (Signed)
NURSING TRIAGE NOTE FOR RESPIRATORY SYMPTOMS  Do you have a fever? "yes for about 3 days, the highest its been is 110.3", oral   Do you have a cough?"not really a cough, it's like when you step down heavy and you get something like something is caught in your throat"   Do you have shortness of breath more than normal? "not really" Do you have chest pain?"yes, when I have that feeling in my throat, my chest and back hurt"  Are you able to eat and drink normally? "oh, yes"  Have you seen a physician for these symptoms? "no but im not going to lie, I went to the foot doctor today and didn't tell him cause I wanted to get my procedure done"  Action  1) do not go out until you talk to clinic md or nurse again 2) if someone is in your home stay 6 ft away but if you live alone try to limit visits. 3)drink plenty of fluids and rest 4) most important of all try not to touch your face and wash or sanitize your hands often and keep surfaces clean 5) if you become short of breath or have constant chest pain call 911  I informed patient to expect a phone call from a physician soon and I sent request to front desk to schedule a virtual office appt for patient and arrive the patient.

## 2019-05-09 ENCOUNTER — Other Ambulatory Visit: Payer: Medicare Other

## 2019-05-09 DIAGNOSIS — Z20822 Contact with and (suspected) exposure to covid-19: Secondary | ICD-10-CM

## 2019-05-09 DIAGNOSIS — R6889 Other general symptoms and signs: Secondary | ICD-10-CM | POA: Diagnosis not present

## 2019-05-09 NOTE — Progress Notes (Signed)
Internal Medicine Clinic Attending  Case discussed with Dr. Dorrell at the time of the visit.  We reviewed the resident's history and exam and pertinent patient test results.  I agree with the assessment, diagnosis, and plan of care documented in the resident's note.    

## 2019-05-12 LAB — NOVEL CORONAVIRUS, NAA: SARS-CoV-2, NAA: NOT DETECTED

## 2019-05-13 ENCOUNTER — Telehealth: Payer: Self-pay | Admitting: *Deleted

## 2019-05-13 NOTE — Telephone Encounter (Signed)
I called [name] and asked if the right toe changed back to normal color quickly and [name] states after 1 hour the color returned to normal. I told [name] she should make an appt for Dr. March Rummage to evaluate the circulation and transferred to scheduler.

## 2019-05-13 NOTE — Telephone Encounter (Signed)
Pt states she had an ingrown toenail procedure about 3 weeks ago and her toe became dark blue after a walk to the mailbox and she wanted to know if this is normal.

## 2019-05-15 ENCOUNTER — Other Ambulatory Visit: Payer: Self-pay

## 2019-05-15 ENCOUNTER — Ambulatory Visit (INDEPENDENT_AMBULATORY_CARE_PROVIDER_SITE_OTHER): Payer: Medicare Other | Admitting: Podiatry

## 2019-05-15 VITALS — Temp 97.9°F

## 2019-05-15 DIAGNOSIS — M79676 Pain in unspecified toe(s): Secondary | ICD-10-CM | POA: Diagnosis not present

## 2019-05-15 DIAGNOSIS — L600 Ingrowing nail: Secondary | ICD-10-CM

## 2019-05-22 ENCOUNTER — Ambulatory Visit: Payer: Medicare Other | Admitting: Podiatry

## 2019-05-22 LAB — CBC WITH DIFFERENTIAL/PLATELET
Absolute Monocytes: 742 cells/uL (ref 200–950)
Basophils Absolute: 13 cells/uL (ref 0–200)
Basophils Relative: 0.2 %
Eosinophils Absolute: 198 cells/uL (ref 15–500)
Eosinophils Relative: 3.1 %
HCT: 32.4 % — ABNORMAL LOW (ref 35.0–45.0)
Hemoglobin: 10.7 g/dL — ABNORMAL LOW (ref 11.7–15.5)
Lymphs Abs: 922 cells/uL (ref 850–3900)
MCH: 28.9 pg (ref 27.0–33.0)
MCHC: 33 g/dL (ref 32.0–36.0)
MCV: 87.6 fL (ref 80.0–100.0)
MPV: 10.3 fL (ref 7.5–12.5)
Monocytes Relative: 11.6 %
Neutro Abs: 4525 cells/uL (ref 1500–7800)
Neutrophils Relative %: 70.7 %
Platelets: 181 10*3/uL (ref 140–400)
RBC: 3.7 10*6/uL — ABNORMAL LOW (ref 3.80–5.10)
RDW: 14.4 % (ref 11.0–15.0)
Total Lymphocyte: 14.4 %
WBC: 6.4 10*3/uL (ref 3.8–10.8)

## 2019-05-22 LAB — COMPLETE METABOLIC PANEL WITH GFR
AG Ratio: 1.3 (calc) (ref 1.0–2.5)
ALT: 16 U/L (ref 6–29)
AST: 24 U/L (ref 10–35)
Albumin: 3.8 g/dL (ref 3.6–5.1)
Alkaline phosphatase (APISO): 72 U/L (ref 37–153)
BUN/Creatinine Ratio: 11 (calc) (ref 6–22)
BUN: 38 mg/dL — ABNORMAL HIGH (ref 7–25)
CO2: 19 mmol/L — ABNORMAL LOW (ref 20–32)
Calcium: 9.2 mg/dL (ref 8.6–10.4)
Chloride: 104 mmol/L (ref 98–110)
Creat: 3.5 mg/dL — ABNORMAL HIGH (ref 0.60–0.93)
GFR, Est African American: 15 mL/min/{1.73_m2} — ABNORMAL LOW (ref >=60)
GFR, Est Non African American: 13 mL/min/{1.73_m2} — ABNORMAL LOW (ref >=60)
Globulin: 2.9 g/dL (calc) (ref 1.9–3.7)
Glucose, Bld: 93 mg/dL (ref 65–99)
Potassium: 3.6 mmol/L (ref 3.5–5.3)
Sodium: 139 mmol/L (ref 135–146)
Total Bilirubin: 0.4 mg/dL (ref 0.2–1.2)
Total Protein: 6.7 g/dL (ref 6.1–8.1)

## 2019-05-22 LAB — LIPID PANEL
Cholesterol: 152 mg/dL (ref <200)
HDL: 47 mg/dL — ABNORMAL LOW (ref >=50)
LDL Cholesterol (Calc): 84 mg/dL (calc)
Non-HDL Cholesterol (Calc): 105 mg/dL (calc) (ref <130)
Total CHOL/HDL Ratio: 3.2 (calc) (ref <5.0)
Triglycerides: 117 mg/dL (ref <150)

## 2019-05-22 LAB — FLUORESCENT TREPONEMAL AB(FTA)-IGG-BLD: Fluorescent Treponemal ABS: REACTIVE — AB

## 2019-05-22 LAB — HIV-1 RNA QUANT-NO REFLEX-BLD
HIV 1 RNA Quant: <20 copies/mL
HIV-1 RNA Quant, Log: <1.3 Log copies/mL

## 2019-05-22 LAB — RPR TITER: RPR Titer: 1:2 {titer} — ABNORMAL HIGH

## 2019-05-22 LAB — RPR: RPR Ser Ql: REACTIVE — AB

## 2019-05-23 DIAGNOSIS — E118 Type 2 diabetes mellitus with unspecified complications: Secondary | ICD-10-CM | POA: Diagnosis not present

## 2019-05-23 DIAGNOSIS — I120 Hypertensive chronic kidney disease with stage 5 chronic kidney disease or end stage renal disease: Secondary | ICD-10-CM | POA: Diagnosis not present

## 2019-05-23 DIAGNOSIS — N2581 Secondary hyperparathyroidism of renal origin: Secondary | ICD-10-CM | POA: Diagnosis not present

## 2019-05-23 DIAGNOSIS — E1122 Type 2 diabetes mellitus with diabetic chronic kidney disease: Secondary | ICD-10-CM | POA: Diagnosis not present

## 2019-05-23 DIAGNOSIS — E669 Obesity, unspecified: Secondary | ICD-10-CM | POA: Diagnosis not present

## 2019-05-23 DIAGNOSIS — B20 Human immunodeficiency virus [HIV] disease: Secondary | ICD-10-CM | POA: Diagnosis not present

## 2019-05-23 DIAGNOSIS — D631 Anemia in chronic kidney disease: Secondary | ICD-10-CM | POA: Diagnosis not present

## 2019-05-23 DIAGNOSIS — R809 Proteinuria, unspecified: Secondary | ICD-10-CM | POA: Diagnosis not present

## 2019-05-23 DIAGNOSIS — N185 Chronic kidney disease, stage 5: Secondary | ICD-10-CM | POA: Diagnosis not present

## 2019-05-23 DIAGNOSIS — Z940 Kidney transplant status: Secondary | ICD-10-CM | POA: Diagnosis not present

## 2019-05-23 DIAGNOSIS — R319 Hematuria, unspecified: Secondary | ICD-10-CM | POA: Diagnosis not present

## 2019-05-26 ENCOUNTER — Telehealth: Payer: Self-pay | Admitting: Infectious Disease

## 2019-05-26 NOTE — Telephone Encounter (Signed)
COVID-19 Pre-Screening Questions:05/26/19 Do you currently have a fever (>100 F), chills or unexplained body aches?NO  Are you currently experiencing new cough, shortness of breath, sore throat, runny nose?NO   Have you recently travelled outside the state of [place] in the last 14 days? NO   Have you been in contact with someone that is currently pending confirmation of Covid19 testing or has been confirmed to have the [place] virus?  NO  **If the patient answers NO to ALL questions -  advise the patient to please call the clinic before coming to the office should any symptoms develop.

## 2019-05-27 ENCOUNTER — Encounter: Payer: Self-pay | Admitting: Infectious Disease

## 2019-05-27 ENCOUNTER — Other Ambulatory Visit: Payer: Self-pay

## 2019-05-27 ENCOUNTER — Ambulatory Visit (INDEPENDENT_AMBULATORY_CARE_PROVIDER_SITE_OTHER): Payer: Medicare Other | Admitting: Infectious Disease

## 2019-05-27 VITALS — BP 171/100 | HR 73 | Temp 98.3°F | Ht 63.0 in | Wt 145.0 lb

## 2019-05-27 DIAGNOSIS — I10 Essential (primary) hypertension: Secondary | ICD-10-CM | POA: Diagnosis not present

## 2019-05-27 DIAGNOSIS — Z940 Kidney transplant status: Secondary | ICD-10-CM

## 2019-05-27 DIAGNOSIS — B20 Human immunodeficiency virus [HIV] disease: Secondary | ICD-10-CM

## 2019-05-27 DIAGNOSIS — A539 Syphilis, unspecified: Secondary | ICD-10-CM

## 2019-05-27 DIAGNOSIS — J111 Influenza due to unidentified influenza virus with other respiratory manifestations: Secondary | ICD-10-CM

## 2019-05-27 DIAGNOSIS — Z8619 Personal history of other infectious and parasitic diseases: Secondary | ICD-10-CM | POA: Diagnosis not present

## 2019-05-27 DIAGNOSIS — R69 Illness, unspecified: Secondary | ICD-10-CM | POA: Diagnosis not present

## 2019-05-27 HISTORY — DX: Influenza due to unidentified influenza virus with other respiratory manifestations: J11.1

## 2019-05-27 NOTE — Progress Notes (Signed)
Chief complaint: followup for HIV disease  Subjective:    Patient ID: [name], female    DOB: 1946-07-28, 73 y.o.   MRN: [id number]  HPI  73 year-old woman with past medical history is significant for HIV infection, hypertension, diabetes mellitus type II, sp renal transplant at [facility]. She has maintained perfect virological suppression though she has had worsening renal fx that has caused use to readjust her ARVs.  We eventually opted to change her to Tivicay, Rilpivirine and lamivudine.   She had done extremely well with her virological suppression but in prior months admitted to missing 50% of her meds due to falling asleep at night.  Surprisingly her VL was s<20 after this  I told her that this level of adherence was recipe for certain virological failure over the long run.  A in reviewing her medications it turns out that she has been on high-dose ranitidine for the past 6 months to a year which she absolutely should not of been on due to the fact that ranitidine and omeprazole because reduced levels of ropivacaine.  I also explicitly put these drugs into her allergies and is contraindicated medications but despite this she has been it prescribed H2 blocking agents by her primary care.  Fortunately for [name] the dolutegravir that she is taking likely protected her thumb failing with resistance which would have assuredly happened if she was for example on something like ODEFSEY.  I planned on changing her to a 2 drug regimen in the form of TIVICAY and ranitidine.  I would like to do the single tablet regimen DOVATA  but this is not yet covered by her insurance.    Ultimately we did change her to [facility] with lamivudine but with the components separately and with lamivudine renally dosed.  I find the the latter and unnecessary stop and would prefer to give her the single tablet regimen but she actually just wants to stay on this regimen for now.  She has maintained excellent  control.  She was hypertensive in clinic though blood pressure drawn through arm where there was a sclerosed access site.  She was seen internal medicine clinic with coronavirus 2019-like symptoms of fever cough pleuritic chest pain.  Her test by PCR from nasal swab was negative though I wonder if she indeed did have this infection.  Her symptoms have subsequently resolved.   Past Medical History:  Diagnosis Date  . CHF (congestive heart failure) ([place])   . Chronic kidney disease    ESRD secondary to DM, started HD in 2008, Dr. Jimmy Footman is her nephrologist, received renal transplant in 2013, no longer on dialysis (06/17/2018)  . Dialysis patient [facility])    T Th Sat  . Early cataracts, bilateral   . GERD (gastroesophageal reflux disease)   . Hepatitis C    untreated. VL 3700000 in 2008; pt reports this has been treated" (06/17/2018)  . High cholesterol   . History of blood transfusion 2013   "when I got the kidney" (06/17/2018)  . History of hepatitis C 08/04/2015  . HIV infection ([place]) 1998   Dx in 1998 in [place], she presented with PCP pneumonia at that time. Has been tried on multiple regimens by her PCP in [place] before./ She has been on current ART for years now. Moved to [facility] in 2008 and is following with Dr. Tommy Medal since then.   . Hypertension   . 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 PONV (postoperative nausea and  vomiting)   . Type II diabetes mellitus ([place])   . Wears glasses     Past Surgical History:  Procedure Laterality Date  . ABDOMINAL HYSTERECTOMY     and oopherectomy for fibroids  . BASCILIC VEIN TRANSPOSITION Left 01/29/2018   Procedure: RIGHT BRACIOCEPHALIC AV FISTULA;  Surgeon: Angelia Mould, MD;  Location: [facility];  Service: Vascular;  Laterality: Left;  . CHOLECYSTECTOMY OPEN    . KIDNEY TRANSPLANT  2013    Family History  Problem Relation Age of Onset  . Diabetes Mother   . Cancer Father   . Cancer Brother       Social History    Socioeconomic History  . Marital status: Single    Spouse name: Not on file  . Number of children: Not on file  . Years of education: Not on file  . Highest education level: Not on file  Occupational History  . Not on file  Social Needs  . Financial resource strain: Not on file  . Food insecurity    Worry: Not on file    Inability: Not on file  . Transportation needs    Medical: Not on file    Non-medical: Not on file  Tobacco Use  . Smoking status: Former Smoker    Packs/day: 0.12    Years: 33.00    Pack years: 3.96    Types: Cigarettes    Quit date: 05/22/1997    Years since quitting: 22.0  . Smokeless tobacco: Never Used  Substance and Sexual Activity  . Alcohol use: Not Currently    Alcohol/week: 0.0 standard drinks  . Drug use: Never  . Sexual activity: Not Currently  Lifestyle  . Physical activity    Days per week: Not on file    Minutes per session: Not on file  . Stress: Not on file  Relationships  . Social Herbalist on phone: Not on file    Gets together: Not on file    Attends religious service: Not on file    Active member of club or organization: Not on file    Attends meetings of clubs or organizations: Not on file    Relationship status: Not on file  Other Topics Concern  . Not on file  Social History Narrative  . Not on file    Allergies  Allergen Reactions  . Omeprazole Other (See Comments)    Interferes with the absorption of rilipivirine  . Influenza Virus Vacc Split Pf Other (See Comments)    UNSPECIFIED REACTION  Alleged severe illness in [place]. Do not have documentation from [place] but pt always refuses vaccine on these grounds  . Influenza Vaccines Other (See Comments)    Other reaction(s): Hallucination     Current Outpatient Medications:  .  amLODipine (NORVASC) 10 MG tablet, TAKE 1 TABLET(10 MG) BY MOUTH DAILY, Disp: 90 tablet, Rfl: 3 .  aspirin 81 MG EC tablet, Take 81 mg by mouth daily.  , Disp: , Rfl:  .   calcitRIOL (ROCALTROL) 0.25 MCG capsule, Take 0.25 mcg by mouth daily., Disp: , Rfl: 6 .  carvedilol (COREG) 25 MG tablet, Take 1 tablet (25 mg total) by mouth 2 (two) times daily with a meal., Disp: 60 tablet, Rfl: 11 .  cinacalcet (SENSIPAR) 60 MG tablet, Take 60 mg by mouth daily., Disp: , Rfl:  .  dolutegravir (TIVICAY) 50 MG tablet, Take 1 tablet (50 mg total) by mouth daily., Disp: 30 tablet, Rfl: 11 .  EDURANT 25 MG TABS tablet, , Disp: , Rfl:  .  fluticasone (FLONASE) 50 MCG/ACT nasal spray, SHAKE LIQUID WELL AND USE 1 SPRAY IN EACH NOSTRIL DAILY (Patient taking differently: Use 1 spray in each nostril daily as needed for allergies), Disp: 16 g, Rfl: 0 .  FREESTYLE TEST STRIPS test strip, Use with glucometer, Disp: 100 each, Rfl: 9 .  furosemide (LASIX) 40 MG tablet, TK 1 T PO D, Disp: , Rfl:  .  Insulin Pen Needle 32G X 4 MM MISC, 16 Units by Does not apply route daily., Disp: 100 each, Rfl: 2 .  lamivudine (EPIVIR) 100 MG tablet, Take 1 tablet (100 mg total) by mouth daily., Disp: 30 tablet, Rfl: 11 .  Lancets (FREESTYLE) lancets, Use as instructed, Disp: 100 each, Rfl: 12 .  loratadine (CLARITIN) 10 MG tablet, Take 1 tablet (10 mg total) by mouth daily. (Patient not taking: Reported on 07/19/2018), Disp: 30 tablet, Rfl: 2 .  magnesium oxide (MAG-OX) 400 MG tablet, Take 400 mg by mouth daily., Disp: , Rfl:  .  Magnesium Oxide 400 (240 Mg) MG TABS, TK 1 T PO BID, Disp: , Rfl: 6 .  mycophenolate (MYFORTIC) 180 MG EC tablet, Take 540 mg by mouth 2 (two) times daily., Disp: , Rfl:  .  pantoprazole (PROTONIX) 20 MG tablet, Take 1 tablet (20 mg total) by mouth daily., Disp: 30 tablet, Rfl: 3 .  potassium chloride SA (K-DUR,KLOR-CON) 20 MEQ tablet, Take 20 mEq by mouth daily., Disp: , Rfl: 10 .  predniSONE (DELTASONE) 5 MG tablet, Take 5 mg by mouth daily with breakfast., Disp: , Rfl:  .  rosuvastatin (CRESTOR) 20 MG tablet, TAKE 1 TABLET(20 MG) BY MOUTH DAILY, Disp: 90 tablet, Rfl: 0 .  silver  sulfADIAZINE (SILVADENE) 1 % cream, Apply pea-sized amount to wound daily., Disp: 50 g, Rfl: 0 .  tacrolimus (PROGRAF) 1 MG capsule, Take 3-4 mg by mouth See admin instructions. Take 4 mg by mouth in the morning and take 3 mg by mouth in the evening, Disp: , Rfl:  .  VOLTAREN 1 % GEL, APPLY 2 GRAMS EXTERNALLY TO THE AFFECTED AREA FOUR TIMES DAILY, Disp: 100 g, Rfl: 2 .  XULTOPHY 100-3.6 UNIT-MG/ML SOPN, INJECT 16 UNDER THE SKIN EVERY MORNING, Disp: 12 mL, Rfl: 3     Review of Systems  Constitutional: Negative for activity change, appetite change, chills, diaphoresis, fatigue, fever and unexpected weight change.  HENT: Negative for congestion, rhinorrhea, sinus pressure, sneezing, sore throat and trouble swallowing.   Eyes: Negative for photophobia and visual disturbance.  Respiratory: Negative for cough, chest tightness, shortness of breath, wheezing and stridor.   Cardiovascular: Negative for chest pain, palpitations and leg swelling.  Gastrointestinal: Negative for abdominal distention, anal bleeding, blood in stool, constipation, diarrhea, nausea and vomiting.  Genitourinary: Negative for difficulty urinating, dysuria, hematuria and pelvic pain.  Musculoskeletal: Negative for back pain, gait problem, joint swelling and myalgias.  Skin: Negative for color change, pallor, rash and wound.  Neurological: Negative for dizziness, tremors, weakness and light-headedness.  Hematological: Negative for adenopathy. Does not bruise/bleed easily.  Psychiatric/Behavioral: Negative for agitation, behavioral problems, confusion, decreased concentration and dysphoric mood.   + for indigestion     Objective:   Physical Exam  Constitutional: She is oriented to person, place, and time. She appears well-developed and well-nourished. No distress.  HENT:  Head: Normocephalic and atraumatic.  Mouth/Throat: Oropharynx is clear and moist. No oropharyngeal exudate.  Eyes: Conjunctivae and EOM are normal. No  scleral icterus.  Neck: Normal range of motion. Neck supple. No JVD present.  Cardiovascular: Normal rate, regular rhythm and normal heart sounds.  Pulmonary/Chest: Effort normal. No respiratory distress. She has no wheezes.  Abdominal: She exhibits no distension.  Musculoskeletal:        General: No tenderness or edema.  Lymphadenopathy:    She has no cervical adenopathy.  Neurological: She is alert and oriented to person, place, and time. Coordination normal.  Skin: Skin is warm and dry. She is not diaphoretic. No erythema. No pallor.  Psychiatric: She has a normal mood and affect. Her behavior is normal. Judgment and thought content normal.          Assessment & Plan:   HIV: Continue DTG and 3TC return to clinic in 7 months time  Possible coronavirus 2019 infection: I will check a serum IgG  DM: being followed by Endocrine, and PCP    HTN: Blood pressure quite elevated but could be partly artifactual given where the blood pressure was measured There were no vitals filed for this visit.   There were no vitals filed for this visit.  CKD is renal transplantation: Sure creatinine is relatively stable and she is being followed by Dr. Jimmy Footman  GERD: start PPI ,  I spent greater than 25 minutes with the patient including greater than 50% of time in face to face counsel of the patient Ellyn Hack her antiretroviral regimen, ways that it can be given, regarding need for influenza vaccine that she again refused, but which I hope she will consider in the fall, ways to avoid novel coronavirus 2019 infection and in coordination of her care.   Lavell Islam. Tommy Medal, [facility]

## 2019-05-28 LAB — SAR COV2 SEROLOGY (COVID19)AB(IGG),IA: SARS CoV2 AB IGG: NEGATIVE

## 2019-06-01 ENCOUNTER — Encounter: Payer: Self-pay | Admitting: *Deleted

## 2019-06-02 ENCOUNTER — Other Ambulatory Visit: Payer: Self-pay | Admitting: *Deleted

## 2019-06-02 ENCOUNTER — Other Ambulatory Visit: Payer: Self-pay | Admitting: Internal Medicine

## 2019-06-02 DIAGNOSIS — Z794 Long term (current) use of insulin: Secondary | ICD-10-CM

## 2019-06-02 DIAGNOSIS — E1121 Type 2 diabetes mellitus with diabetic nephropathy: Secondary | ICD-10-CM

## 2019-06-02 MED ORDER — FREESTYLE TEST VI STRP: ORAL_STRIP | 1 refills | Status: DC

## 2019-06-03 NOTE — Progress Notes (Signed)
  Subjective:  Patient ID: LILLYANNE BRADBURN, female    DOB: 04-11-1946,  MRN: [id number]  Chief Complaint  Patient presents with  . Nail Problem    Right 1st ingrown both borders follow up. Pt states improvement but still painful, no drainage. Pt states toe turned blue 1hr duration after walking to the mailbox. Pt states there was no numbness/pain when this occurred.   73 y.o. female returns for the above complaint.  History as above  Objective:   General AA&O x3. Normal mood and affect.  Vascular Foot warm and well perfused with good capillary refill.  Neurologic Sensation grossly intact.  Dermatologic Nail avulsion site right hallux -lateral side healed left side with decreased fibrosis slight scabbing no warmth erythema signs of acute infection  Orthopedic:  Slight tenderness to palpation of the toe.   Assessment & Plan:  Patient was evaluated and treated and all questions answered.  S/p Ingrown Toenail Excision, right -Discussed that I am unsure cysts near the nail discoloration concern however the lateral aspect of the nail appears to be healing well with the medial side appears to be improved since prior.  Continue antibiotic ointment.  Pubic mobility as this will be slow to heal however there is no signs of acute infection.  Continue soaking antibiotic ointment follow-up in several weeks for recheck

## 2019-06-12 ENCOUNTER — Ambulatory Visit (INDEPENDENT_AMBULATORY_CARE_PROVIDER_SITE_OTHER): Payer: Medicare Other | Admitting: Podiatry

## 2019-06-12 ENCOUNTER — Encounter: Payer: Self-pay | Admitting: Podiatry

## 2019-06-12 ENCOUNTER — Other Ambulatory Visit: Payer: Self-pay

## 2019-06-12 VITALS — Temp 97.9°F

## 2019-06-12 DIAGNOSIS — L600 Ingrowing nail: Secondary | ICD-10-CM

## 2019-06-12 DIAGNOSIS — Z9889 Other specified postprocedural states: Secondary | ICD-10-CM

## 2019-06-12 NOTE — Progress Notes (Signed)
  Subjective:  Patient ID: [name], female    DOB: [birth year],  MRN: [id number]  Chief Complaint  Patient presents with  . Ingrown Toenail    Follow up nail procedure hallux right   "Its much better"   73 y.o. female returns for the above complaint.  History as above  Objective:   General AA&O x3. Normal mood and affect.  Vascular Foot warm and well perfused with good capillary refill.  Neurologic Sensation grossly intact.  Dermatologic Nail avulsion sites both healed without warmth erythema or signs of infection.  Orthopedic: No tenderness to palpation of the toe.   Assessment & Plan:  Patient was evaluated and treated and all questions answered.  S/p Ingrown Toenail Excision, right -Both nail borders well healed. Discussed no need for further abx cream or soaking. F/u as needed.

## 2019-06-13 ENCOUNTER — Ambulatory Visit: Payer: Medicare Other | Admitting: Podiatry

## 2019-06-16 DIAGNOSIS — H5213 Myopia, bilateral: Secondary | ICD-10-CM | POA: Diagnosis not present

## 2019-06-16 DIAGNOSIS — H04123 Dry eye syndrome of bilateral lacrimal glands: Secondary | ICD-10-CM | POA: Diagnosis not present

## 2019-06-16 DIAGNOSIS — H524 Presbyopia: Secondary | ICD-10-CM | POA: Diagnosis not present

## 2019-06-16 DIAGNOSIS — H25813 Combined forms of age-related cataract, bilateral: Secondary | ICD-10-CM | POA: Diagnosis not present

## 2019-06-16 DIAGNOSIS — E119 Type 2 diabetes mellitus without complications: Secondary | ICD-10-CM | POA: Diagnosis not present

## 2019-06-16 DIAGNOSIS — H52203 Unspecified astigmatism, bilateral: Secondary | ICD-10-CM | POA: Diagnosis not present

## 2019-07-03 ENCOUNTER — Other Ambulatory Visit: Payer: Self-pay

## 2019-07-03 DIAGNOSIS — E1129 Type 2 diabetes mellitus with other diabetic kidney complication: Secondary | ICD-10-CM

## 2019-07-03 DIAGNOSIS — E785 Hyperlipidemia, unspecified: Secondary | ICD-10-CM

## 2019-07-03 DIAGNOSIS — Z794 Long term (current) use of insulin: Secondary | ICD-10-CM

## 2019-07-03 NOTE — Telephone Encounter (Signed)
carvedilol (COREG) 25 MG tablet    XULTOPHY 100-3.6 UNIT-MG/ML SOPN   REFILL REQUEST @  [id number] - [address], [place] AT [address] [phone] (Phone) [phone] (Fax)

## 2019-07-17 MED ORDER — PANTOPRAZOLE SODIUM 20 MG PO TBEC: 20.0000 mg | DELAYED_RELEASE_TABLET | Freq: Every day | ORAL | 3 refills | Status: AC

## 2019-07-17 MED ORDER — XULTOPHY 100-3.6 UNIT-MG/ML ~~LOC~~ SOPN: 16.0000 [IU] | PEN_INJECTOR | Freq: Every morning | SUBCUTANEOUS | 3 refills | Status: AC

## 2019-07-17 MED ORDER — CARVEDILOL 25 MG PO TABS: 25.0000 mg | ORAL_TABLET | Freq: Two times a day (BID) | ORAL | 11 refills | Status: AC

## 2019-07-23 DIAGNOSIS — N185 Chronic kidney disease, stage 5: Secondary | ICD-10-CM | POA: Diagnosis not present

## 2019-07-23 DIAGNOSIS — Z940 Kidney transplant status: Secondary | ICD-10-CM | POA: Diagnosis not present

## 2019-07-23 DIAGNOSIS — D631 Anemia in chronic kidney disease: Secondary | ICD-10-CM | POA: Diagnosis not present

## 2019-07-23 DIAGNOSIS — B20 Human immunodeficiency virus [HIV] disease: Secondary | ICD-10-CM | POA: Diagnosis not present

## 2019-07-23 DIAGNOSIS — R319 Hematuria, unspecified: Secondary | ICD-10-CM | POA: Diagnosis not present

## 2019-07-23 DIAGNOSIS — N2581 Secondary hyperparathyroidism of renal origin: Secondary | ICD-10-CM | POA: Diagnosis not present

## 2019-07-23 DIAGNOSIS — E782 Mixed hyperlipidemia: Secondary | ICD-10-CM | POA: Diagnosis not present

## 2019-07-23 DIAGNOSIS — E1122 Type 2 diabetes mellitus with diabetic chronic kidney disease: Secondary | ICD-10-CM | POA: Diagnosis not present

## 2019-07-23 DIAGNOSIS — E118 Type 2 diabetes mellitus with unspecified complications: Secondary | ICD-10-CM | POA: Diagnosis not present

## 2019-07-23 DIAGNOSIS — R809 Proteinuria, unspecified: Secondary | ICD-10-CM | POA: Diagnosis not present

## 2019-07-23 DIAGNOSIS — I120 Hypertensive chronic kidney disease with stage 5 chronic kidney disease or end stage renal disease: Secondary | ICD-10-CM | POA: Diagnosis not present

## 2019-07-25 ENCOUNTER — Other Ambulatory Visit: Payer: Self-pay | Admitting: *Deleted

## 2019-07-25 DIAGNOSIS — E785 Hyperlipidemia, unspecified: Secondary | ICD-10-CM

## 2019-07-30 MED ORDER — ROSUVASTATIN CALCIUM 20 MG PO TABS: ORAL_TABLET | ORAL | 1 refills | Status: AC

## 2019-07-30 NOTE — Telephone Encounter (Signed)
Needs appointment with PCP

## 2019-07-31 NOTE — Telephone Encounter (Signed)
Scheduled [facility] appt Thanks

## 2019-07-31 NOTE — Telephone Encounter (Signed)
PCP do not have any appt available, please advised.

## 2019-08-07 ENCOUNTER — Ambulatory Visit: Payer: Medicare Other

## 2019-09-12 ENCOUNTER — Telehealth: Payer: Self-pay | Admitting: Internal Medicine

## 2019-09-12 ENCOUNTER — Inpatient Hospital Stay (HOSPITAL_COMMUNITY)
Admission: EM | Admit: 2019-09-12 | Discharge: 2019-09-19 | DRG: 177 | Disposition: A | Discharge status: Home Health | Payer: Medicare Other | Attending: Internal Medicine | Admitting: Internal Medicine

## 2019-09-12 ENCOUNTER — Other Ambulatory Visit: Payer: Self-pay

## 2019-09-12 ENCOUNTER — Emergency Department (HOSPITAL_COMMUNITY): Payer: Medicare Other

## 2019-09-12 ENCOUNTER — Encounter (HOSPITAL_COMMUNITY): Payer: Self-pay | Admitting: *Deleted

## 2019-09-12 DIAGNOSIS — T8619 Other complication of kidney transplant: Secondary | ICD-10-CM | POA: Diagnosis present

## 2019-09-12 DIAGNOSIS — J129 Viral pneumonia, unspecified: Secondary | ICD-10-CM

## 2019-09-12 DIAGNOSIS — J9601 Acute respiratory failure with hypoxia: Secondary | ICD-10-CM | POA: Diagnosis present

## 2019-09-12 DIAGNOSIS — Z7982 Long term (current) use of aspirin: Secondary | ICD-10-CM

## 2019-09-12 DIAGNOSIS — D649 Anemia, unspecified: Secondary | ICD-10-CM | POA: Diagnosis present

## 2019-09-12 DIAGNOSIS — Z833 Family history of diabetes mellitus: Secondary | ICD-10-CM

## 2019-09-12 DIAGNOSIS — U071 COVID-19: Secondary | ICD-10-CM | POA: Diagnosis not present

## 2019-09-12 DIAGNOSIS — E860 Dehydration: Secondary | ICD-10-CM | POA: Diagnosis present

## 2019-09-12 DIAGNOSIS — Z809 Family history of malignant neoplasm, unspecified: Secondary | ICD-10-CM

## 2019-09-12 DIAGNOSIS — I129 Hypertensive chronic kidney disease with stage 1 through stage 4 chronic kidney disease, or unspecified chronic kidney disease: Secondary | ICD-10-CM | POA: Diagnosis present

## 2019-09-12 DIAGNOSIS — Y830 Surgical operation with transplant of whole organ as the cause of abnormal reaction of the patient, or of later complication, without mention of misadventure at the time of the procedure: Secondary | ICD-10-CM | POA: Diagnosis present

## 2019-09-12 DIAGNOSIS — B182 Chronic viral hepatitis C: Secondary | ICD-10-CM | POA: Diagnosis present

## 2019-09-12 DIAGNOSIS — E1122 Type 2 diabetes mellitus with diabetic chronic kidney disease: Secondary | ICD-10-CM | POA: Diagnosis present

## 2019-09-12 DIAGNOSIS — R05 Cough: Secondary | ICD-10-CM | POA: Diagnosis not present

## 2019-09-12 DIAGNOSIS — A084 Viral intestinal infection, unspecified: Secondary | ICD-10-CM | POA: Diagnosis present

## 2019-09-12 DIAGNOSIS — D84821 Immunodeficiency due to drugs: Secondary | ICD-10-CM | POA: Diagnosis not present

## 2019-09-12 DIAGNOSIS — Z79899 Other long term (current) drug therapy: Secondary | ICD-10-CM

## 2019-09-12 DIAGNOSIS — Z66 Do not resuscitate: Secondary | ICD-10-CM | POA: Diagnosis not present

## 2019-09-12 DIAGNOSIS — R509 Fever, unspecified: Secondary | ICD-10-CM | POA: Diagnosis not present

## 2019-09-12 DIAGNOSIS — N184 Chronic kidney disease, stage 4 (severe): Secondary | ICD-10-CM | POA: Diagnosis present

## 2019-09-12 DIAGNOSIS — B20 Human immunodeficiency virus [HIV] disease: Secondary | ICD-10-CM | POA: Diagnosis present

## 2019-09-12 DIAGNOSIS — Z87891 Personal history of nicotine dependence: Secondary | ICD-10-CM

## 2019-09-12 DIAGNOSIS — Z8619 Personal history of other infectious and parasitic diseases: Secondary | ICD-10-CM | POA: Diagnosis present

## 2019-09-12 DIAGNOSIS — K219 Gastro-esophageal reflux disease without esophagitis: Secondary | ICD-10-CM | POA: Diagnosis present

## 2019-09-12 DIAGNOSIS — E785 Hyperlipidemia, unspecified: Secondary | ICD-10-CM | POA: Diagnosis present

## 2019-09-12 DIAGNOSIS — N179 Acute kidney failure, unspecified: Secondary | ICD-10-CM

## 2019-09-12 DIAGNOSIS — I10 Essential (primary) hypertension: Secondary | ICD-10-CM | POA: Diagnosis present

## 2019-09-12 DIAGNOSIS — Z794 Long term (current) use of insulin: Secondary | ICD-10-CM

## 2019-09-12 DIAGNOSIS — Z887 Allergy status to serum and vaccine status: Secondary | ICD-10-CM

## 2019-09-12 DIAGNOSIS — Z940 Kidney transplant status: Secondary | ICD-10-CM | POA: Diagnosis not present

## 2019-09-12 DIAGNOSIS — E119 Type 2 diabetes mellitus without complications: Secondary | ICD-10-CM

## 2019-09-12 DIAGNOSIS — E872 Acidosis: Secondary | ICD-10-CM | POA: Diagnosis present

## 2019-09-12 DIAGNOSIS — J1289 Other viral pneumonia: Secondary | ICD-10-CM | POA: Diagnosis not present

## 2019-09-12 DIAGNOSIS — Z21 Asymptomatic human immunodeficiency virus [HIV] infection status: Secondary | ICD-10-CM | POA: Diagnosis present

## 2019-09-12 LAB — COMPREHENSIVE METABOLIC PANEL
ALT: 18 U/L (ref 0–44)
AST: 38 U/L (ref 15–41)
Albumin: 2.8 g/dL — ABNORMAL LOW (ref 3.5–5.0)
Alkaline Phosphatase: 39 U/L (ref 38–126)
Anion gap: 18 — ABNORMAL HIGH (ref 5–15)
BUN: 59 mg/dL — ABNORMAL HIGH (ref 8–23)
CO2: 14 mmol/L — ABNORMAL LOW (ref 22–32)
Calcium: 9 mg/dL (ref 8.9–10.3)
Chloride: 101 mmol/L (ref 98–111)
Creatinine, Ser: 5.22 mg/dL — ABNORMAL HIGH (ref 0.44–1.00)
GFR calc Af Amer: 9 mL/min — ABNORMAL LOW (ref >60)
GFR calc non Af Amer: 8 mL/min — ABNORMAL LOW (ref >60)
Glucose, Bld: 157 mg/dL — ABNORMAL HIGH (ref 70–99)
Potassium: 3.7 mmol/L (ref 3.5–5.1)
Sodium: 133 mmol/L — ABNORMAL LOW (ref 135–145)
Total Bilirubin: 0.7 mg/dL (ref 0.3–1.2)
Total Protein: 6.6 g/dL (ref 6.5–8.1)

## 2019-09-12 LAB — CBC WITH DIFFERENTIAL/PLATELET
Abs Immature Granulocytes: 0.03 10*3/uL (ref 0.00–0.07)
Basophils Absolute: 0 10*3/uL (ref 0.0–0.1)
Basophils Relative: 0 %
Eosinophils Absolute: 0 10*3/uL (ref 0.0–0.5)
Eosinophils Relative: 0 %
HCT: 31.2 % — ABNORMAL LOW (ref 36.0–46.0)
Hemoglobin: 10.3 g/dL — ABNORMAL LOW (ref 12.0–15.0)
Immature Granulocytes: 1 %
Lymphocytes Relative: 11 %
Lymphs Abs: 0.5 10*3/uL — ABNORMAL LOW (ref 0.7–4.0)
MCH: 29.4 pg (ref 26.0–34.0)
MCHC: 33 g/dL (ref 30.0–36.0)
MCV: 89.1 fL (ref 80.0–100.0)
Monocytes Absolute: 0.5 10*3/uL (ref 0.1–1.0)
Monocytes Relative: 12 %
Neutro Abs: 3.5 10*3/uL (ref 1.7–7.7)
Neutrophils Relative %: 76 %
Platelets: 152 10*3/uL (ref 150–400)
RBC: 3.5 MIL/uL — ABNORMAL LOW (ref 3.87–5.11)
RDW: 14.1 % (ref 11.5–15.5)
WBC: 4.5 10*3/uL (ref 4.0–10.5)
nRBC: 0 % (ref 0.0–0.2)

## 2019-09-12 LAB — FERRITIN: Ferritin: 579 ng/mL — ABNORMAL HIGH (ref 11–307)

## 2019-09-12 LAB — D-DIMER, QUANTITATIVE: D-Dimer, Quant: 3.08 ug/mL-FEU — ABNORMAL HIGH (ref 0.00–0.50)

## 2019-09-12 LAB — C-REACTIVE PROTEIN: CRP: 14.6 mg/dL — ABNORMAL HIGH (ref <1.0)

## 2019-09-12 LAB — TRIGLYCERIDES: Triglycerides: 153 mg/dL — ABNORMAL HIGH (ref <150)

## 2019-09-12 LAB — LACTIC ACID, PLASMA: Lactic Acid, Venous: 1.2 mmol/L (ref 0.5–1.9)

## 2019-09-12 LAB — FIBRINOGEN: Fibrinogen: 746 mg/dL — ABNORMAL HIGH (ref 210–475)

## 2019-09-12 LAB — LACTATE DEHYDROGENASE: LDH: 306 U/L — ABNORMAL HIGH (ref 98–192)

## 2019-09-12 LAB — PROCALCITONIN: Procalcitonin: 2.02 ng/mL

## 2019-09-12 MED ORDER — SODIUM CHLORIDE 0.9% FLUSH
3.0000 mL | Freq: Once | INTRAVENOUS | Status: AC
  Administered 2019-09-12: 19:00:00 3 mL via INTRAVENOUS

## 2019-09-12 NOTE — ED Triage Notes (Signed)
Pt with neck pain, headache, abdominal pain, diarrhea and cough.

## 2019-09-12 NOTE — Telephone Encounter (Signed)
I agree. Thank you.

## 2019-09-12 NOTE — Telephone Encounter (Signed)
Returned call to patient's daughter. States patient has had diarrhea x 7 days, "barely eating," breathing is shallow and patient becomes SHOB with talking, also with non-productive cough and "bad pain in the back of neck." Denies fever, denies sick contacts. Daughter advised to take patient to ED immediately. States she will. Hubbard Hartshorn, BSN, RN-BC

## 2019-09-12 NOTE — ED Provider Notes (Addendum)
[facility] EMERGENCY DEPARTMENT Provider Note   CSN: [id number] Arrival date & time: 09/12/19  1549     History   Chief Complaint Chief Complaint  Patient presents with  . Neck Pain    fever  . Abdominal Pain    HPI [name] is a 73 y.o. female.     HPI Patient presents with myalgias cough abdominal pain diarrhea headaches chills over the last week.  Unsure if she has had fevers.  Former dialysis patient now post kidney transplant.  Mild abdominal pain.  States she has been feeling worse the last day or 2.  Still has her sense of taste.  Also history of HIV. Past Medical History:  Diagnosis Date  . CHF (congestive heart failure) ([place])   . Chronic kidney disease    ESRD secondary to DM, started HD in 2008, Dr. Jimmy Footman is her nephrologist, received renal transplant in 2013, no longer on dialysis (06/17/2018)  . Dialysis patient [facility])    T Th Sat  . Early cataracts, bilateral   . GERD (gastroesophageal reflux disease)   . Hepatitis C    untreated. VL [id number] in 2008; pt reports this has been treated" (06/17/2018)  . High cholesterol   . History of blood transfusion 2013   "when I got the kidney" (06/17/2018)  . History of hepatitis C 08/04/2015  . HIV infection ([place]) 1998   Dx in 1998 in [place], she presented with PCP pneumonia at that time. Has been tried on multiple regimens by her PCP in [place] before./ She has been on current ART for years now. Moved to [facility] in 2008 and is following with Dr. Tommy Medal since then.   . Hypertension   . Influenza-like illness 05/27/2019  . 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 PONV (postoperative nausea and vomiting)   . Type II diabetes mellitus ([place])   . Wears glasses     Patient Active Problem List   Diagnosis Date Noted  . Influenza-like illness 05/27/2019  . Fever 05/08/2019  . AIDS (acquired immune deficiency syndrome) ([place]) 07/19/2018  . History of hepatitis C 08/04/2015  . Kidney transplant  recipient 10/02/2014  . Tingling in extremities 06/23/2014  . Systemic lupus erythematosus (SLE) inhibitor ([place]) 09/24/2012  . Seasonal allergic rhinitis 09/20/2012  . Health care maintenance 09/20/2012  . GERD (gastroesophageal reflux disease) 08/27/2012  . Hyperlipidemia 08/27/2012  . Hearing loss of both ears 06/26/2012  . Nonspecific abnormal cardiovascular system function study 06/07/2012  . Anemia 05/22/2012  . ACQUIRED ABSENCE OF BOTH CERVIX AND UTERUS 09/21/2010  . UNSPECIFIED SYPHILIS 05/06/2008  . HEPATITIS C 11/12/2007  . Essential hypertension, benign 11/12/2007  . DM2 (diabetes mellitus, type 2) ([place]) 12/04/1993    Past Surgical History:  Procedure Laterality Date  . ABDOMINAL HYSTERECTOMY     and oopherectomy for fibroids  . BASCILIC VEIN TRANSPOSITION Left 01/29/2018   Procedure: RIGHT BRACIOCEPHALIC AV FISTULA;  Surgeon: Angelia Mould, MD;  Location: [facility];  Service: Vascular;  Laterality: Left;  . CHOLECYSTECTOMY OPEN    . KIDNEY TRANSPLANT  2013     OB History   No obstetric history on file.      Home Medications    Prior to Admission medications   Medication Sig Start Date End Date Taking? Authorizing Provider  amLODipine (NORVASC) 10 MG tablet TAKE 1 TABLET(10 MG) BY MOUTH DAILY 03/24/19   Annia Belt, MD  aspirin 81 MG EC tablet Take 81 mg by  mouth daily.      [provider]  calcitRIOL (ROCALTROL) 0.25 MCG capsule Take 0.25 mcg by mouth daily. 05/06/18   [provider]  carvedilol (COREG) 25 MG tablet Take 1 tablet (25 mg total) by mouth 2 (two) times daily with a meal. 07/17/19   Oda Kilts, MD  cinacalcet (SENSIPAR) 60 MG tablet Take 60 mg by mouth daily.    [provider]  dolutegravir (TIVICAY) 50 MG tablet Take 1 tablet (50 mg total) by mouth daily. 11/05/18   Truman Hayward, MD  fluticasone [facility]) 50 MCG/ACT nasal spray SHAKE LIQUID WELL AND USE 1 SPRAY IN EACH NOSTRIL DAILY Patient  taking differently: Use 1 spray in each nostril daily as needed for allergies 01/07/18   Lorella Nimrod, MD  FREESTYLE TEST STRIPS test strip Use to check blood sugar 4 times daily. diag code E11.9. insulin dependent 06/02/19   Oda Kilts, MD  furosemide (LASIX) 80 MG tablet TK 1 T PO D 04/10/19   [provider]  Insulin Degludec-Liraglutide (XULTOPHY) 100-3.6 UNIT-MG/ML SOPN Inject 16 Units into the skin every morning. 07/17/19   Oda Kilts, MD  Insulin Pen Needle 32G X 4 MM MISC 16 Units by Does not apply route daily. 05/17/18   Lorella Nimrod, MD  lamivudine (EPIVIR) 100 MG tablet Take 1 tablet (100 mg total) by mouth daily. 11/05/18   Truman Hayward, MD  Lancets (FREESTYLE) lancets Use as instructed 05/17/18   Lorella Nimrod, MD  loratadine (CLARITIN) 10 MG tablet Take 1 tablet (10 mg total) by mouth daily. Patient not taking: Reported on 07/19/2018 07/20/17 07/20/18  Lorella Nimrod, MD  magnesium oxide (MAG-OX) 400 MG tablet Take 400 mg by mouth daily.    [provider]  mycophenolate (MYFORTIC) 180 MG EC tablet Take 540 mg by mouth 2 (two) times daily.    [provider]  pantoprazole (PROTONIX) 20 MG tablet Take 1 tablet (20 mg total) by mouth daily. 07/17/19   Oda Kilts, MD  potassium chloride SA (K-DUR,KLOR-CON) 20 MEQ tablet Take 20 mEq by mouth daily. 05/13/18   [provider]  predniSONE (DELTASONE) 5 MG tablet Take 5 mg by mouth daily with breakfast.    [provider]  rosuvastatin (CRESTOR) 20 MG tablet TAKE 1 TABLET(20 MG) BY MOUTH DAILY 07/30/19   Sid Falcon, MD  silver sulfADIAZINE (SILVADENE) 1 % cream Apply pea-sized amount to wound daily. 05/08/19   Evelina Bucy, DPM  tacrolimus (PROGRAF) 1 MG capsule Take 3-4 mg by mouth See admin instructions. Take 4 mg by mouth in the morning and take 3 mg by mouth in the evening 10/01/13   [provider]  VOLTAREN 1 % GEL APPLY 2 GRAMS EXTERNALLY TO THE AFFECTED  AREA FOUR TIMES DAILY 04/16/19   Lorella Nimrod, MD    Family History Family History  Problem Relation Age of Onset  . Diabetes Mother   . Cancer Father   . Cancer Brother     Social History Social History   Tobacco Use  . Smoking status: Former Smoker    Packs/day: 0.12    Years: 33.00    Pack years: 3.96    Types: Cigarettes    Quit date: 05/22/1997    Years since quitting: 22.3  . Smokeless tobacco: Never Used  Substance Use Topics  . Alcohol use: Not Currently    Alcohol/week: 0.0 standard drinks  . Drug use: Never  Allergies   Omeprazole, Influenza virus vacc split pf, and Influenza vaccines   Review of Systems Review of Systems  Constitutional: Positive for appetite change and fatigue.  HENT: Negative for congestion.   Respiratory: Positive for cough.   Gastrointestinal: Positive for abdominal pain, diarrhea and nausea.  Genitourinary: Negative for flank pain.  Musculoskeletal: Negative for back pain.  Skin: Negative for rash.  Neurological: Negative for weakness.  Psychiatric/Behavioral: Negative for confusion.     Physical Exam Updated Vital Signs BP (!) 160/74   Pulse 77   Temp 100.3 F (37.9 C) (Oral)   Resp (!) 31   Ht 5\' 3"  (1.6 m)   Wt 64.9 kg   SpO2 91%   BMI 25.33 kg/m   Physical Exam Vitals signs reviewed.  HENT:     Head: Normocephalic.  Eyes:     General: No scleral icterus.    Pupils: Pupils are equal, round, and reactive to light.  Cardiovascular:     Rate and Rhythm: Normal rate and regular rhythm.  Pulmonary:     Effort: No respiratory distress.     Comments: Tachypnea. Abdominal:     General: There is no distension.     Tenderness: There is no abdominal tenderness.     Hernia: No hernia is present.  Skin:    General: Skin is warm.  Neurological:     Mental Status: She is alert.      ED Treatments / Results  Labs (all labs ordered are listed, but only abnormal results are displayed) Labs Reviewed   COMPREHENSIVE METABOLIC PANEL - Abnormal; Notable for the following components:      Result Value   Sodium 133 (*)    CO2 14 (*)    Glucose, Bld 157 (*)    BUN 59 (*)    Creatinine, Ser 5.22 (*)    Albumin 2.8 (*)    GFR calc non Af Amer 8 (*)    GFR calc Af Amer 9 (*)    Anion gap 18 (*)    All other components within normal limits  CBC WITH DIFFERENTIAL/PLATELET - Abnormal; Notable for the following components:   RBC 3.50 (*)    Hemoglobin 10.3 (*)    HCT 31.2 (*)    Lymphs Abs 0.5 (*)    All other components within normal limits  D-DIMER, QUANTITATIVE (NOT AT [facility]) - Abnormal; Notable for the following components:   D-Dimer, Quant 3.08 (*)    All other components within normal limits  LACTATE DEHYDROGENASE - Abnormal; Notable for the following components:   LDH 306 (*)    All other components within normal limits  TRIGLYCERIDES - Abnormal; Notable for the following components:   Triglycerides 153 (*)    All other components within normal limits  FIBRINOGEN - Abnormal; Notable for the following components:   Fibrinogen 746 (*)    All other components within normal limits  CULTURE, BLOOD (ROUTINE X 2)  CULTURE, BLOOD (ROUTINE X 2)  SARS CORONAVIRUS 2 (TAT 6-24 HRS)  LACTIC ACID, PLASMA  PROCALCITONIN  LACTIC ACID, PLASMA  URINALYSIS, ROUTINE W REFLEX MICROSCOPIC  C-REACTIVE PROTEIN  FERRITIN    EKG None  Radiology Dg Chest Portable 1 View  Result Date: 09/12/2019 CLINICAL DATA:  Cough and fever. EXAM: PORTABLE CHEST 1 VIEW COMPARISON:  06/17/2018 FINDINGS: There are patchy faint bilateral peripheral infiltrates in both mid zones and at both lung bases worrisome for viral pneumonia. Heart size and pulmonary vascularity are normal. No effusions. No  significant bone abnormality. IMPRESSION: Patchy bilateral peripheral infiltrates in the mid zones and at both lung bases worrisome for viral pneumonia. No other significant abnormalities. Electronically Signed   By: Lorriane Shire M.D.   On: 09/12/2019 18:16    Procedures Procedures (including critical care time)  Medications Ordered in ED Medications  sodium chloride flush (NS) 0.9 % injection 3 mL (3 mLs Intravenous Given 09/12/19 1845)     Initial Impression / Assessment and Plan / ED Course  I have reviewed the triage vital signs and the nursing notes.  Pertinent labs & imaging results that were available during my care of the patient were reviewed by me and considered in my medical decision making (see chart for details).        Patient with chills.  Myalgias.  Diarrhea and cough.  X-ray shows likely viral pneumonia.  However also is HIV positive and a renal transplant.  Creatinine increased from 3 up to 5.  Pending is likely is a covert infection but test will not be back for few hours.  However sats will dip down to about 89% at rest.  Requiring some nasal cannula oxygen.  Will admit to internal medicine residents  NARUMI FEWELL was evaluated in Emergency Department on 09/12/2019 for the symptoms described in the history of present illness. She was evaluated in the context of the global COVID-19 pandemic, which necessitated consideration that the patient might be at risk for infection with the SARS-CoV-2 virus that causes COVID-19. Institutional protocols and algorithms that pertain to the evaluation of patients at risk for COVID-19 are in a state of rapid change based on information released by regulatory bodies including the CDC and federal and state organizations. These policies and algorithms were followed during the patient's care in the ED.  Discussed with internal medicine residents.  They feels that since she is likely COVID would be better served at [facility] however they do not admit over there.  We cannot get the COVID test back acutely due to a shortage of the test and the delay would be sore between 6 and 24 hours to get the test back.  Will discuss with hospitalist about  admission and transfer.  Discussed with Dr. Hal Hope.  States he cannot transfer patient to [facility] without a COVID test.  I have called the lab and the test will likely be back in around 3 hours.  Care turned over to Dr Leonette Monarch   Final Clinical Impressions(s) / ED Diagnoses   Final diagnoses:  Viral pneumonia  AKI (acute kidney injury) [facility])    ED Discharge Orders    None       Davonna Belling, MD 09/12/19 Patrecia Pour    Davonna Belling, MD 09/12/19 2020    Davonna Belling, MD 09/12/19 2302

## 2019-09-12 NOTE — Telephone Encounter (Signed)
Pt daughter said pt is not eating, she is vomiting, and she is breathing slow; pls contact [phone]

## 2019-09-13 ENCOUNTER — Encounter (HOSPITAL_COMMUNITY): Payer: Self-pay | Admitting: Internal Medicine

## 2019-09-13 DIAGNOSIS — Y830 Surgical operation with transplant of whole organ as the cause of abnormal reaction of the patient, or of later complication, without mention of misadventure at the time of the procedure: Secondary | ICD-10-CM | POA: Diagnosis present

## 2019-09-13 DIAGNOSIS — Z7982 Long term (current) use of aspirin: Secondary | ICD-10-CM | POA: Diagnosis not present

## 2019-09-13 DIAGNOSIS — Z794 Long term (current) use of insulin: Secondary | ICD-10-CM | POA: Diagnosis not present

## 2019-09-13 DIAGNOSIS — E785 Hyperlipidemia, unspecified: Secondary | ICD-10-CM | POA: Diagnosis present

## 2019-09-13 DIAGNOSIS — N179 Acute kidney failure, unspecified: Secondary | ICD-10-CM | POA: Insufficient documentation

## 2019-09-13 DIAGNOSIS — Z809 Family history of malignant neoplasm, unspecified: Secondary | ICD-10-CM | POA: Diagnosis not present

## 2019-09-13 DIAGNOSIS — N184 Chronic kidney disease, stage 4 (severe): Secondary | ICD-10-CM | POA: Diagnosis present

## 2019-09-13 DIAGNOSIS — Z833 Family history of diabetes mellitus: Secondary | ICD-10-CM | POA: Diagnosis not present

## 2019-09-13 DIAGNOSIS — I10 Essential (primary) hypertension: Secondary | ICD-10-CM | POA: Diagnosis not present

## 2019-09-13 DIAGNOSIS — Z66 Do not resuscitate: Secondary | ICD-10-CM | POA: Diagnosis present

## 2019-09-13 DIAGNOSIS — E872 Acidosis: Secondary | ICD-10-CM | POA: Diagnosis present

## 2019-09-13 DIAGNOSIS — A084 Viral intestinal infection, unspecified: Secondary | ICD-10-CM | POA: Diagnosis present

## 2019-09-13 DIAGNOSIS — J9601 Acute respiratory failure with hypoxia: Secondary | ICD-10-CM

## 2019-09-13 DIAGNOSIS — U071 COVID-19: Secondary | ICD-10-CM | POA: Diagnosis present

## 2019-09-13 DIAGNOSIS — J1289 Other viral pneumonia: Secondary | ICD-10-CM | POA: Diagnosis present

## 2019-09-13 DIAGNOSIS — Z21 Asymptomatic human immunodeficiency virus [HIV] infection status: Secondary | ICD-10-CM | POA: Diagnosis present

## 2019-09-13 DIAGNOSIS — D649 Anemia, unspecified: Secondary | ICD-10-CM | POA: Diagnosis present

## 2019-09-13 DIAGNOSIS — K219 Gastro-esophageal reflux disease without esophagitis: Secondary | ICD-10-CM | POA: Diagnosis present

## 2019-09-13 DIAGNOSIS — B182 Chronic viral hepatitis C: Secondary | ICD-10-CM | POA: Diagnosis present

## 2019-09-13 DIAGNOSIS — Z79899 Other long term (current) drug therapy: Secondary | ICD-10-CM | POA: Diagnosis not present

## 2019-09-13 DIAGNOSIS — D84821 Immunodeficiency due to drugs: Secondary | ICD-10-CM | POA: Diagnosis present

## 2019-09-13 DIAGNOSIS — E1122 Type 2 diabetes mellitus with diabetic chronic kidney disease: Secondary | ICD-10-CM | POA: Diagnosis present

## 2019-09-13 DIAGNOSIS — T8619 Other complication of kidney transplant: Secondary | ICD-10-CM | POA: Diagnosis present

## 2019-09-13 DIAGNOSIS — I129 Hypertensive chronic kidney disease with stage 1 through stage 4 chronic kidney disease, or unspecified chronic kidney disease: Secondary | ICD-10-CM | POA: Diagnosis present

## 2019-09-13 DIAGNOSIS — Z87891 Personal history of nicotine dependence: Secondary | ICD-10-CM | POA: Diagnosis not present

## 2019-09-13 DIAGNOSIS — E860 Dehydration: Secondary | ICD-10-CM | POA: Diagnosis present

## 2019-09-13 LAB — CBC WITH DIFFERENTIAL/PLATELET
Abs Immature Granulocytes: 0.02 10*3/uL (ref 0.00–0.07)
Basophils Absolute: 0 10*3/uL (ref 0.0–0.1)
Basophils Relative: 0 %
Eosinophils Absolute: 0 10*3/uL (ref 0.0–0.5)
Eosinophils Relative: 1 %
HCT: 28.6 % — ABNORMAL LOW (ref 36.0–46.0)
Hemoglobin: 9.5 g/dL — ABNORMAL LOW (ref 12.0–15.0)
Immature Granulocytes: 1 %
Lymphocytes Relative: 12 %
Lymphs Abs: 0.5 10*3/uL — ABNORMAL LOW (ref 0.7–4.0)
MCH: 30 pg (ref 26.0–34.0)
MCHC: 33.2 g/dL (ref 30.0–36.0)
MCV: 90.2 fL (ref 80.0–100.0)
Monocytes Absolute: 0.5 10*3/uL (ref 0.1–1.0)
Monocytes Relative: 11 %
Neutro Abs: 3.1 10*3/uL (ref 1.7–7.7)
Neutrophils Relative %: 75 %
Platelets: 146 10*3/uL — ABNORMAL LOW (ref 150–400)
RBC: 3.17 MIL/uL — ABNORMAL LOW (ref 3.87–5.11)
RDW: 14 % (ref 11.5–15.5)
WBC: 4.1 10*3/uL (ref 4.0–10.5)
nRBC: 0 % (ref 0.0–0.2)

## 2019-09-13 LAB — TYPE AND SCREEN
ABO/RH(D): O POS
Antibody Screen: NEGATIVE

## 2019-09-13 LAB — GLUCOSE, CAPILLARY
Glucose-Capillary: 149 mg/dL — ABNORMAL HIGH (ref 70–99)
Glucose-Capillary: 157 mg/dL — ABNORMAL HIGH (ref 70–99)
Glucose-Capillary: 139 mg/dL — ABNORMAL HIGH (ref 70–99)

## 2019-09-13 LAB — D-DIMER, QUANTITATIVE: D-Dimer, Quant: 2.95 ug/mL-FEU — ABNORMAL HIGH (ref 0.00–0.50)

## 2019-09-13 LAB — CBG MONITORING, ED: Glucose-Capillary: 121 mg/dL — ABNORMAL HIGH (ref 70–99)

## 2019-09-13 LAB — COMPREHENSIVE METABOLIC PANEL
ALT: 17 U/L (ref 0–44)
AST: 31 U/L (ref 15–41)
Albumin: 2.3 g/dL — ABNORMAL LOW (ref 3.5–5.0)
Alkaline Phosphatase: 35 U/L — ABNORMAL LOW (ref 38–126)
Anion gap: 14 (ref 5–15)
BUN: 64 mg/dL — ABNORMAL HIGH (ref 8–23)
CO2: 17 mmol/L — ABNORMAL LOW (ref 22–32)
Calcium: 8.9 mg/dL (ref 8.9–10.3)
Chloride: 105 mmol/L (ref 98–111)
Creatinine, Ser: 5.01 mg/dL — ABNORMAL HIGH (ref 0.44–1.00)
GFR calc Af Amer: 9 mL/min — ABNORMAL LOW (ref >60)
GFR calc non Af Amer: 8 mL/min — ABNORMAL LOW (ref >60)
Glucose, Bld: 98 mg/dL (ref 70–99)
Potassium: 3.5 mmol/L (ref 3.5–5.1)
Sodium: 136 mmol/L (ref 135–145)
Total Bilirubin: 0.7 mg/dL (ref 0.3–1.2)
Total Protein: 5.7 g/dL — ABNORMAL LOW (ref 6.5–8.1)

## 2019-09-13 LAB — SARS CORONAVIRUS 2 (TAT 6-24 HRS): SARS Coronavirus 2: POSITIVE — AB

## 2019-09-13 LAB — PROCALCITONIN: Procalcitonin: 2.31 ng/mL

## 2019-09-13 LAB — FERRITIN: Ferritin: 520 ng/mL — ABNORMAL HIGH (ref 11–307)

## 2019-09-13 LAB — C-REACTIVE PROTEIN: CRP: 12.3 mg/dL — ABNORMAL HIGH (ref <1.0)

## 2019-09-13 MED ORDER — PANTOPRAZOLE SODIUM 20 MG PO TBEC
20.0000 mg | DELAYED_RELEASE_TABLET | Freq: Every day | ORAL | Status: DC
  Administered 2019-09-13: 20 mg via ORAL
  Filled 2019-09-13 (×3): qty 1

## 2019-09-13 MED ORDER — ACETAMINOPHEN 325 MG PO TABS
650.0000 mg | ORAL_TABLET | Freq: Four times a day (QID) | ORAL | Status: DC | PRN
  Administered 2019-09-13: 650 mg via ORAL
  Filled 2019-09-13: qty 2

## 2019-09-13 MED ORDER — TACROLIMUS 1 MG PO CAPS
2.0000 mg | ORAL_CAPSULE | ORAL | Status: DC
  Administered 2019-09-14 – 2019-09-17 (×4): 2 mg via ORAL
  Filled 2019-09-13 (×7): qty 2

## 2019-09-13 MED ORDER — MYCOPHENOLATE SODIUM 180 MG PO TBEC
540.0000 mg | DELAYED_RELEASE_TABLET | Freq: Two times a day (BID) | ORAL | Status: DC
  Filled 2019-09-13 (×3): qty 3

## 2019-09-13 MED ORDER — INSULIN DEGLUDEC-LIRAGLUTIDE 100-3.6 UNIT-MG/ML ~~LOC~~ SOPN: 5.0000 [IU] | PEN_INJECTOR | Freq: Every morning | SUBCUTANEOUS | Status: DC

## 2019-09-13 MED ORDER — METHYLPREDNISOLONE SODIUM SUCC 40 MG IJ SOLR
40.0000 mg | Freq: Two times a day (BID) | INTRAMUSCULAR | Status: DC
  Administered 2019-09-13 – 2019-09-15 (×5): 40 mg via INTRAVENOUS
  Filled 2019-09-13 (×5): qty 1

## 2019-09-13 MED ORDER — SODIUM CHLORIDE 0.9 % IV SOLN
500.0000 mg | INTRAVENOUS | Status: DC
  Administered 2019-09-13 – 2019-09-16 (×4): 500 mg via INTRAVENOUS
  Filled 2019-09-13 (×4): qty 500

## 2019-09-13 MED ORDER — DOLUTEGRAVIR SODIUM 50 MG PO TABS
50.0000 mg | ORAL_TABLET | Freq: Every day | ORAL | Status: DC
  Administered 2019-09-13 – 2019-09-19 (×6): 50 mg via ORAL
  Filled 2019-09-13 (×8): qty 1

## 2019-09-13 MED ORDER — BENZONATATE 100 MG PO CAPS
200.0000 mg | ORAL_CAPSULE | Freq: Three times a day (TID) | ORAL | Status: DC | PRN
  Administered 2019-09-13 – 2019-09-17 (×4): 200 mg via ORAL
  Filled 2019-09-13 (×4): qty 2

## 2019-09-13 MED ORDER — ROSUVASTATIN CALCIUM 20 MG PO TABS
20.0000 mg | ORAL_TABLET | Freq: Every day | ORAL | Status: DC
  Administered 2019-09-13 – 2019-09-18 (×4): 20 mg via ORAL
  Filled 2019-09-13 (×6): qty 1

## 2019-09-13 MED ORDER — TACROLIMUS 1 MG PO CAPS
3.0000 mg | ORAL_CAPSULE | ORAL | Status: DC
  Administered 2019-09-14 – 2019-09-16 (×3): 3 mg via ORAL
  Filled 2019-09-13 (×7): qty 3

## 2019-09-13 MED ORDER — POTASSIUM CHLORIDE CRYS ER 20 MEQ PO TBCR
20.0000 meq | EXTENDED_RELEASE_TABLET | Freq: Every day | ORAL | Status: DC
  Administered 2019-09-13 – 2019-09-19 (×6): 20 meq via ORAL
  Filled 2019-09-13 (×7): qty 1

## 2019-09-13 MED ORDER — ONDANSETRON HCL 4 MG/2ML IJ SOLN
4.0000 mg | Freq: Four times a day (QID) | INTRAMUSCULAR | Status: DC | PRN
  Administered 2019-09-13 – 2019-09-17 (×4): 4 mg via INTRAVENOUS
  Filled 2019-09-13 (×5): qty 2

## 2019-09-13 MED ORDER — MAGNESIUM OXIDE 400 (241.3 MG) MG PO TABS
400.0000 mg | ORAL_TABLET | Freq: Every day | ORAL | Status: DC
  Administered 2019-09-13 – 2019-09-19 (×6): 400 mg via ORAL
  Filled 2019-09-13 (×7): qty 1

## 2019-09-13 MED ORDER — CINACALCET HCL 30 MG PO TABS
60.0000 mg | ORAL_TABLET | Freq: Every day | ORAL | Status: DC
  Administered 2019-09-13 – 2019-09-17 (×4): 60 mg via ORAL
  Filled 2019-09-13 (×9): qty 2

## 2019-09-13 MED ORDER — HEPARIN SODIUM (PORCINE) 5000 UNIT/ML IJ SOLN
5000.0000 [IU] | Freq: Three times a day (TID) | INTRAMUSCULAR | Status: DC
  Administered 2019-09-13 – 2019-09-18 (×13): 5000 [IU] via SUBCUTANEOUS
  Filled 2019-09-13 (×15): qty 1

## 2019-09-13 MED ORDER — CARVEDILOL 12.5 MG PO TABS
25.0000 mg | ORAL_TABLET | Freq: Two times a day (BID) | ORAL | Status: DC
  Administered 2019-09-13 – 2019-09-19 (×9): 25 mg via ORAL
  Filled 2019-09-13 (×12): qty 2

## 2019-09-13 MED ORDER — SODIUM CHLORIDE 0.9 % IV SOLN
100.0000 mg | INTRAVENOUS | Status: AC
  Administered 2019-09-14 – 2019-09-17 (×4): 100 mg via INTRAVENOUS
  Filled 2019-09-13 (×4): qty 20

## 2019-09-13 MED ORDER — AMLODIPINE BESYLATE 10 MG PO TABS
10.0000 mg | ORAL_TABLET | Freq: Every day | ORAL | Status: DC
  Administered 2019-09-13 – 2019-09-19 (×6): 10 mg via ORAL
  Filled 2019-09-13: qty 2
  Filled 2019-09-13 (×2): qty 1
  Filled 2019-09-13 (×5): qty 2
  Filled 2019-09-13: qty 1

## 2019-09-13 MED ORDER — DEXAMETHASONE 4 MG PO TABS
6.0000 mg | ORAL_TABLET | Freq: Every day | ORAL | Status: DC
  Administered 2019-09-13: 6 mg via ORAL
  Filled 2019-09-13: qty 2

## 2019-09-13 MED ORDER — TACROLIMUS 1 MG PO CAPS
3.0000 mg | ORAL_CAPSULE | Freq: Once | ORAL | Status: AC
  Administered 2019-09-13: 3 mg via ORAL
  Filled 2019-09-13: qty 3

## 2019-09-13 MED ORDER — ONDANSETRON HCL 4 MG PO TABS: 4.0000 mg | ORAL_TABLET | Freq: Four times a day (QID) | ORAL | Status: DC | PRN

## 2019-09-13 MED ORDER — SODIUM CHLORIDE 0.9 % IV SOLN
200.0000 mg | Freq: Once | INTRAVENOUS | Status: AC
  Administered 2019-09-13: 200 mg via INTRAVENOUS
  Filled 2019-09-13: qty 40

## 2019-09-13 MED ORDER — SODIUM CHLORIDE 0.9 % IV SOLN
INTRAVENOUS | Status: DC
  Administered 2019-09-13: 07:00:00 via INTRAVENOUS

## 2019-09-13 MED ORDER — INSULIN GLARGINE 100 UNIT/ML ~~LOC~~ SOLN
6.0000 [IU] | Freq: Every day | SUBCUTANEOUS | Status: DC
  Administered 2019-09-13 – 2019-09-16 (×4): 6 [IU] via SUBCUTANEOUS
  Filled 2019-09-13 (×4): qty 0.06

## 2019-09-13 MED ORDER — LAMIVUDINE 10 MG/ML PO SOLN
50.0000 mg | Freq: Every day | ORAL | Status: DC
  Administered 2019-09-13 – 2019-09-19 (×5): 50 mg via ORAL
  Filled 2019-09-13 (×8): qty 5

## 2019-09-13 MED ORDER — MYCOPHENOLATE SODIUM 180 MG PO TBEC
540.0000 mg | DELAYED_RELEASE_TABLET | Freq: Two times a day (BID) | ORAL | Status: DC
  Administered 2019-09-13 – 2019-09-19 (×10): 540 mg via ORAL
  Filled 2019-09-13 (×14): qty 3

## 2019-09-13 MED ORDER — LAMIVUDINE 100 MG PO TABS: 50.0000 mg | ORAL_TABLET | Freq: Every day | ORAL | Status: DC

## 2019-09-13 MED ORDER — SODIUM CHLORIDE 0.9 % IV BOLUS
500.0000 mL | Freq: Once | INTRAVENOUS | Status: AC
  Administered 2019-09-13: 500 mL via INTRAVENOUS

## 2019-09-13 MED ORDER — SODIUM CHLORIDE 0.9 % IV SOLN
1.0000 g | INTRAVENOUS | Status: DC
  Administered 2019-09-13 – 2019-09-16 (×4): 1 g via INTRAVENOUS
  Filled 2019-09-13 (×4): qty 10

## 2019-09-13 MED ORDER — SODIUM BICARBONATE 650 MG PO TABS
650.0000 mg | ORAL_TABLET | Freq: Two times a day (BID) | ORAL | Status: DC
  Administered 2019-09-13 – 2019-09-17 (×8): 650 mg via ORAL
  Filled 2019-09-13 (×10): qty 1

## 2019-09-13 MED ORDER — CALCITRIOL 0.25 MCG PO CAPS
0.2500 ug | ORAL_CAPSULE | Freq: Every day | ORAL | Status: DC
  Filled 2019-09-13 (×2): qty 1

## 2019-09-13 MED ORDER — DOLUTEGRAVIR SODIUM 50 MG PO TABS
50.0000 mg | ORAL_TABLET | Freq: Every day | ORAL | Status: DC
  Filled 2019-09-13: qty 1

## 2019-09-13 MED ORDER — SODIUM CHLORIDE 0.9 % IV SOLN
INTRAVENOUS | Status: DC
  Administered 2019-09-13 – 2019-09-14 (×2): via INTRAVENOUS

## 2019-09-13 MED ORDER — ACETAMINOPHEN 650 MG RE SUPP: 650.0000 mg | Freq: Four times a day (QID) | RECTAL | Status: DC | PRN

## 2019-09-13 MED ORDER — INSULIN ASPART 100 UNIT/ML ~~LOC~~ SOLN
0.0000 [IU] | Freq: Three times a day (TID) | SUBCUTANEOUS | Status: DC
  Administered 2019-09-13: 1 [IU] via SUBCUTANEOUS
  Administered 2019-09-13: 2 [IU] via SUBCUTANEOUS
  Administered 2019-09-13: 1 [IU] via SUBCUTANEOUS
  Administered 2019-09-14: 3 [IU] via SUBCUTANEOUS
  Administered 2019-09-14 (×2): 2 [IU] via SUBCUTANEOUS
  Administered 2019-09-15: 3 [IU] via SUBCUTANEOUS
  Administered 2019-09-15: 5 [IU] via SUBCUTANEOUS
  Administered 2019-09-15: 3 [IU] via SUBCUTANEOUS
  Administered 2019-09-16 (×2): 5 [IU] via SUBCUTANEOUS
  Administered 2019-09-16 – 2019-09-17 (×3): 3 [IU] via SUBCUTANEOUS
  Administered 2019-09-17: 2 [IU] via SUBCUTANEOUS
  Administered 2019-09-18: 5 [IU] via SUBCUTANEOUS
  Administered 2019-09-18: 1 [IU] via SUBCUTANEOUS
  Administered 2019-09-19: 3 [IU] via SUBCUTANEOUS

## 2019-09-13 MED ORDER — SODIUM BICARBONATE 8.4 % IV SOLN: INTRAVENOUS | Status: DC

## 2019-09-13 MED ORDER — TACROLIMUS 1 MG PO CAPS: 3.0000 mg | ORAL_CAPSULE | ORAL | Status: DC

## 2019-09-13 MED ORDER — PANTOPRAZOLE SODIUM 40 MG PO TBEC
40.0000 mg | DELAYED_RELEASE_TABLET | Freq: Every day | ORAL | Status: DC
  Administered 2019-09-14 – 2019-09-19 (×5): 40 mg via ORAL
  Filled 2019-09-13 (×6): qty 1

## 2019-09-13 MED ORDER — LOPERAMIDE HCL 2 MG PO CAPS
4.0000 mg | ORAL_CAPSULE | Freq: Once | ORAL | Status: AC
  Administered 2019-09-13: 4 mg via ORAL
  Filled 2019-09-13: qty 2

## 2019-09-13 MED ORDER — ASPIRIN 81 MG PO TBEC
81.0000 mg | DELAYED_RELEASE_TABLET | Freq: Every day | ORAL | Status: DC
  Administered 2019-09-13 – 2019-09-19 (×6): 81 mg via ORAL
  Filled 2019-09-13 (×15): qty 1

## 2019-09-13 MED ORDER — CALCITRIOL 0.25 MCG PO CAPS
0.2500 ug | ORAL_CAPSULE | Freq: Every day | ORAL | Status: DC
  Administered 2019-09-13 – 2019-09-17 (×5): 0.25 ug via ORAL
  Filled 2019-09-13 (×9): qty 1

## 2019-09-13 NOTE — Progress Notes (Signed)
1905 Report received and care assumed from [facility]. Pt sleeping in bed with call bell in reach. NADN No c/o or needs. Will continue to monitor

## 2019-09-13 NOTE — Progress Notes (Addendum)
   Brief note: Patient is a 74 year old female with history of HIV, diabetes type 2, status post failed transplant with baseline creatinine around 3.5, hypertension who presented with cough, diarrhea, nausea, fever and chills from home.  On presentation she was found to be febrile.  Chest x-ray showed bilateral infiltrates concerning for pneumonia.  She was mildly hypoxic requiring oxygen supplementation.  Covid-19 test was found to be positive.  She was found to have acute kidney injury on CKD with creatinine elevated to 5.2.  Patient was admitted for the management of acute respiratory failure with hypoxia secondary to Covid-19 and also acute renal failure. Patient has been started on IV fluids because she was found to be dehydrated, Solu-Medrol, remdesivir.  Her kidney function is improving with IV fluids.  I talked to the patient on phone this morning and she says she is feeling better. She denies dyspnea,cough is getting better. She denies any sick contacts at home or outside.  Currently she is afebrile and requiring 2 to 3 L of oxygen per minute.  Patient will be transferred to [facility].  Patient updated about her condition  And plan and she verbalized understanding. Patient seen by Dr. Hal Hope this morning.

## 2019-09-13 NOTE — ED Notes (Signed)
ED TO INPATIENT HANDOFF REPORT  ED Nurse Name and Phone #:  [phone]  S Name/Age/Gender [name] 73 y.o. female Room/Bed: 030C/030C  Code Status   Code Status: Prior  Home/SNF/Other Home Patient oriented to: self, place, time and situation Is this baseline? Yes   Triage Complete: Triage complete  Chief Complaint N/V/D, headache, cough  Triage Note Pt with neck pain, headache, abdominal pain, diarrhea and cough.     Allergies Allergies  Allergen Reactions  . Omeprazole Other (See Comments)    Interferes with the absorption of rilipivirine  . Influenza Virus Vacc Split Pf Other (See Comments)    UNSPECIFIED REACTION  Alleged severe illness in [place]. Do not have documentation from [place] but pt always refuses vaccine on these grounds  . Influenza Vaccines Other (See Comments)    Other reaction(s): Hallucination    Level of Care/Admitting Diagnosis ED Disposition    ED Disposition Condition [facility] Area: [place] [[id number]]  Level of Care: Telemetry [5]  Covid Evaluation: Confirmed COVID Positive  Diagnosis: [id number] virus infection [id number]  Admitting Physician: Rise Patience [phone]  Attending Physician: Rise Patience [phone]  Estimated length of stay: past midnight tomorrow  Certification:: I certify this patient will need inpatient services for at least 2 midnights  PT Class (Do Not Modify): Inpatient [101]  PT Acc Code (Do Not Modify): Private [1]       B Medical/Surgery History Past Medical History:  Diagnosis Date  . CHF (congestive heart failure) ([place])   . Chronic kidney disease    ESRD secondary to DM, started HD in 2008, Dr. Jimmy Footman is her nephrologist, received renal transplant in 2013, no longer on dialysis (06/17/2018)  . Dialysis patient [facility])    T Th Sat  . Early cataracts, bilateral   . GERD (gastroesophageal reflux disease)   . Hepatitis C    untreated. VL [id number] in 2008; pt  reports this has been treated" (06/17/2018)  . High cholesterol   . History of blood transfusion 2013   "when I got the kidney" (06/17/2018)  . History of hepatitis C 08/04/2015  . HIV infection ([place]) 1998   Dx in 1998 in [place], she presented with PCP pneumonia at that time. Has been tried on multiple regimens by her PCP in [place] before./ She has been on current ART for years now. Moved to [facility] in 2008 and is following with Dr. Tommy Medal since then.   . Hypertension   . Influenza-like illness 05/27/2019  . 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 PONV (postoperative nausea and vomiting)   . Type II diabetes mellitus ([place])   . Wears glasses    Past Surgical History:  Procedure Laterality Date  . ABDOMINAL HYSTERECTOMY     and oopherectomy for fibroids  . BASCILIC VEIN TRANSPOSITION Left 01/29/2018   Procedure: RIGHT BRACIOCEPHALIC AV FISTULA;  Surgeon: Angelia Mould, MD;  Location: [place];  Service: Vascular;  Laterality: Left;  . CHOLECYSTECTOMY OPEN    . KIDNEY TRANSPLANT  2013     A IV Location/Drains/Wounds Patient Lines/Drains/Airways Status   Active Line/Drains/Airways    Name:   Placement date:   Placement time:   Site:   Days:   Peripheral IV 09/12/19 Right Forearm   09/12/19    1837    Forearm   1   Fistula / Graft Left Forearm Arteriovenous fistula   01/29/18    1027  Forearm   592   Incision (Closed) 01/29/18 Arm Left   01/29/18    0957     592          Intake/Output Last 24 hours No intake or output data in the 24 hours ending 09/13/19 0502  Labs/Imaging Results for orders placed or performed during the hospital encounter of 09/12/19 (from the past 48 hour(s))  Lactic acid, plasma     Status: None   Collection Time: 09/12/19  6:48 PM  Result Value Ref Range   Lactic Acid, Venous 1.2 0.5 - 1.9 mmol/L    Comment: Performed at [facility] [address] [place], [place] [address]  Comprehensive metabolic panel     Status: Abnormal   Collection  Time: 09/12/19  6:48 PM  Result Value Ref Range   Sodium 133 (L) 135 - 145 mmol/L   Potassium 3.7 3.5 - 5.1 mmol/L   Chloride 101 98 - 111 mmol/L   CO2 14 (L) 22 - 32 mmol/L   Glucose, Bld 157 (H) 70 - 99 mg/dL   BUN 59 (H) 8 - 23 mg/dL   Creatinine, Ser 5.22 (H) 0.44 - 1.00 mg/dL   Calcium 9.0 8.9 - 10.3 mg/dL   Total Protein 6.6 6.5 - 8.1 g/dL   Albumin 2.8 (L) 3.5 - 5.0 g/dL   AST 38 15 - 41 U/L   ALT 18 0 - 44 U/L   Alkaline Phosphatase 39 38 - 126 U/L   Total Bilirubin 0.7 0.3 - 1.2 mg/dL   GFR calc non Af Amer 8 (L) >60 mL/min   GFR calc Af Amer 9 (L) >60 mL/min   Anion gap 18 (H) 5 - 15    Comment: Performed at [facility], [place] [address] [place], [place] [address]  CBC with Differential     Status: Abnormal   Collection Time: 09/12/19  6:48 PM  Result Value Ref Range   WBC 4.5 4.0 - 10.5 K/uL   RBC 3.50 (L) 3.87 - 5.11 MIL/uL   Hemoglobin 10.3 (L) 12.0 - 15.0 g/dL   HCT 31.2 (L) 36.0 - 46.0 %   MCV 89.1 80.0 - 100.0 fL   MCH 29.4 26.0 - 34.0 pg   MCHC 33.0 30.0 - 36.0 g/dL   RDW 14.1 11.5 - 15.5 %   Platelets 152 150 - 400 K/uL   nRBC 0.0 0.0 - 0.2 %   Neutrophils Relative % 76 %   Neutro Abs 3.5 1.7 - 7.7 K/uL   Lymphocytes Relative 11 %   Lymphs Abs 0.5 (L) 0.7 - 4.0 K/uL   Monocytes Relative 12 %   Monocytes Absolute 0.5 0.1 - 1.0 K/uL   Eosinophils Relative 0 %   Eosinophils Absolute 0.0 0.0 - 0.5 K/uL   Basophils Relative 0 %   Basophils Absolute 0.0 0.0 - 0.1 K/uL   Immature Granulocytes 1 %   Abs Immature Granulocytes 0.03 0.00 - 0.07 K/uL    Comment: Performed at [facility], [place] [address], [place], [place] [address]  SARS CORONAVIRUS 2 (TAT 6-24 HRS) Nasopharyngeal Nasopharyngeal Swab     Status: Abnormal   Collection Time: 09/12/19  6:48 PM   Specimen: Nasopharyngeal Swab  Result Value Ref Range   SARS Coronavirus 2 POSITIVE (A) NEGATIVE    Comment: RESULT CALLED TO, READ BACK BY AND VERIFIED WITH: P. PEREZ,RN 0135 09/13/2019 T.  TYSOR (NOTE) SARS-CoV-2 target nucleic acids are DETECTED. The SARS-CoV-2 RNA is  generally detectable in upper and lower respiratory specimens during the acute phase of infection. Positive results are indicative of active infection with SARS-CoV-2. Clinical  correlation with patient history and other diagnostic information is necessary to determine patient infection status. Positive results do  not rule out bacterial infection or co-infection with other viruses. The expected result is Negative. Fact Sheet for Patients: SugarRoll.be Fact Sheet for Healthcare Providers: https://www.woods-mathews.com/ This test is not yet approved or cleared by the [place] FDA and  has been authorized for detection and/or diagnosis of SARS-CoV-2 by FDA under an Emergency Use Authorization (EUA). This EUA will remain  in effect (meaning this test can be used) for  the duration of the COVID-19 declaration under Section 564(b)(1) of the Act, 21 U.S.C. section 360bbb-3(b)(1), unless the authorization is terminated or revoked sooner. Performed at [facility], [place] [address], [place], [place] [address]   D-dimer, quantitative     Status: Abnormal   Collection Time: 09/12/19  6:48 PM  Result Value Ref Range   D-Dimer, Quant 3.08 (H) 0.00 - 0.50 ug/mL-FEU    Comment: (NOTE) At the manufacturer cut-off of 0.50 ug/mL FEU, this assay has been documented to exclude PE with a sensitivity and negative predictive value of 97 to 99%.  At this time, this assay has not been approved by the FDA to exclude DVT/VTE. Results should be correlated with clinical presentation. Performed at [facility], [place] [address] [place], [place] [address]   Procalcitonin     Status: None   Collection Time: 09/12/19  6:48 PM  Result Value Ref Range   Procalcitonin 2.02 ng/mL    Comment:        Interpretation: PCT > 2 ng/mL: Systemic infection (sepsis) is likely, unless  other causes are known. (NOTE)       Sepsis PCT Algorithm           Lower Respiratory Tract                                      Infection PCT Algorithm    ----------------------------     ----------------------------         PCT < 0.25 ng/mL                PCT < 0.10 ng/mL         Strongly encourage             Strongly discourage   discontinuation of antibiotics    initiation of antibiotics    ----------------------------     -----------------------------       PCT 0.25 - 0.50 ng/mL            PCT 0.10 - 0.25 ng/mL               OR       >80% decrease in PCT            Discourage initiation of                                            antibiotics      Encourage discontinuation           of antibiotics    ----------------------------     -----------------------------  PCT >= 0.50 ng/mL              PCT 0.26 - 0.50 ng/mL               AND       <80% decrease in PCT              Encourage initiation of                                             antibiotics       Encourage continuation           of antibiotics    ----------------------------     -----------------------------        PCT >= 0.50 ng/mL                  PCT > 0.50 ng/mL               AND         increase in PCT                  Strongly encourage                                      initiation of antibiotics    Strongly encourage escalation           of antibiotics                                     -----------------------------                                           PCT <= 0.25 ng/mL                                                 OR                                        > 80% decrease in PCT                                     Discontinue / Do not initiate                                             antibiotics Performed at [facility], [address] [place], [place] [address]   Lactate dehydrogenase     Status: Abnormal   Collection Time: 09/12/19  6:48 PM  Result Value Ref Range   LDH  306 (H) 98 - 192 U/L    Comment: Performed at [facility], [place] [address] [place], [place] [address]  Triglycerides  Status: Abnormal   Collection Time: 09/12/19  6:48 PM  Result Value Ref Range   Triglycerides 153 (H) <150 mg/dL    Comment: Performed at [facility] [address] [place], [place] [address]  Fibrinogen     Status: Abnormal   Collection Time: 09/12/19  6:48 PM  Result Value Ref Range   Fibrinogen 746 (H) 210 - 475 mg/dL    Comment: Performed at [facility] [address], [place], [place] [address]  C-reactive protein     Status: Abnormal   Collection Time: 09/12/19  7:45 PM  Result Value Ref Range   CRP 14.6 (H) <1.0 mg/dL    Comment: Performed at [facility] [address] [place], [place] [address]  Ferritin     Status: Abnormal   Collection Time: 09/12/19  7:45 PM  Result Value Ref Range   Ferritin 579 (H) 11 - 307 ng/mL    Comment: Performed at [facility], [place] [address], [place], [place] [address]   Dg Chest Portable 1 View  Result Date: 09/12/2019 CLINICAL DATA:  Cough and fever. EXAM: PORTABLE CHEST 1 VIEW COMPARISON:  06/17/2018 FINDINGS: There are patchy faint bilateral peripheral infiltrates in both mid zones and at both lung bases worrisome for viral pneumonia. Heart size and pulmonary vascularity are normal. No effusions. No significant bone abnormality. IMPRESSION: Patchy bilateral peripheral infiltrates in the mid zones and at both lung bases worrisome for viral pneumonia. No other significant abnormalities. Electronically Signed   By: Lorriane Shire M.D.   On: 09/12/2019 18:16    Pending Labs Unresulted Labs (From admission, onward)    Start     Ordered   09/12/19 1843  Culture, blood (routine x 2)  BLOOD CULTURE X 2,   STAT     09/12/19 1843   09/12/19 1640  Lactic acid, plasma  Now then every 2 hours,   STAT     09/12/19 1639   09/12/19 1640  Urinalysis, Routine w reflex microscopic  ONCE - STAT,   STAT      09/12/19 1639          Vitals/Pain Today's Vitals   09/13/19 0254 09/13/19 0330 09/13/19 0400 09/13/19 0501  BP:  (!) 146/74 (!) 165/68   Pulse:  71 69   Resp:  13 (!) 27   Temp:      TempSrc:      SpO2:  98% 98%   Weight:      Height:      PainSc: 0-No pain   Asleep    Isolation Precautions Airborne and Contact precautions  Medications Medications  sodium chloride flush (NS) 0.9 % injection 3 mL (3 mLs Intravenous Given 09/12/19 1845)  sodium chloride 0.9 % bolus 500 mL (0 mLs Intravenous Stopped 09/13/19 0440)    Mobility walks Low fall risk   Focused Assessments Cardiac Assessment Handoff:  Cardiac Rhythm: Normal sinus rhythm Lab Results  Component Value Date   CKTOTAL 240 (H) 10/28/2007   CKMB 7.6 (H) 10/28/2007   TROPONINI <0.03 06/18/2018   Lab Results  Component Value Date   DDIMER 3.08 (H) 09/12/2019   Does the Patient currently have chest pain? No  , Pulmonary Assessment Handoff:  Lung sounds: Bilateral Breath Sounds: Diminished, Rhonchi O2 Device: Room Air        R Recommendations: See Admitting Provider Note  Report given to:   Additional Notes:  Lives at home with husband. Placed on  O2 @ 2lpm for comfort. Denies complaints at this time.

## 2019-09-13 NOTE — Progress Notes (Signed)
Pharmacy Antibiotic Note  [name] is a 73 y.o. female admitted on 09/12/2019 with COVID 19.  Pharmacy has been consulted for Remdesivir dosing.  CXR shows Patchy bilateral peripheral infiltrates in the mid zones and at both lung bases worrisome for viral pneumonia Pt requiring supplemental oxygen (2L [place]) ALT 18  Plan: Remdesivir 200mg  IV now then 100mg  IV daily x 4 days Will f/u ALT and pt's clinical condition   Height: 5\' 3"  (160 cm) Weight: 143 lb (64.9 kg) IBW/kg (Calculated) : 52.4  Temp (24hrs), Avg:100.3 F (37.9 C), Min:100.3 F (37.9 C), Max:100.3 F (37.9 C)  Recent Labs  Lab 09/12/19 1848  WBC 4.5  CREATININE 5.22*  LATICACIDVEN 1.2    Estimated Creatinine Clearance: 9.1 mL/min (A) (by C-G formula based on SCr of 5.22 mg/dL (H)).    Allergies  Allergen Reactions  . Omeprazole Other (See Comments)    Interferes with the absorption of rilipivirine  . Influenza Virus Vacc Split Pf Other (See Comments)    UNSPECIFIED REACTION  Alleged severe illness in [place]. Do not have documentation from [place] but pt always refuses vaccine on these grounds  . Influenza Vaccines Other (See Comments)    Other reaction(s): Hallucination    Antimicrobials this admission: 10/10 Remdesivir  >> 10/14  Microbiology results: 10/9 BCx:   Thank you for allowing pharmacy to be a part of this patient's care.  Sherlon Handing, PharmD, BCPS 09/13/2019 6:14 AM

## 2019-09-13 NOTE — Progress Notes (Signed)
Brief transfer note  Brief summary: 73 year old with history of renal transplantation on immunosuppressive's with chronic kidney disease stage IV, HIV on antiretrovirals, chronic hepatitis C, DM-2 on insulin-presenting with 6-7 history of cough-followed by exertional dyspnea and severe diarrhea.  Found to have acute hypoxic respiratory failure secondary COVID-19 pneumonia along with AKI on CKD stage III with metabolic acidosis.  Subsequently transferred to [facility].  Subjective: No vomiting-diarrhea has slowed down-she is only 1-2 loose toes today.  No abdominal pain.  Looks very comfortable.  Objective: Today's Vitals   09/13/19 0930 09/13/19 0957 09/13/19 1215 09/13/19 1227  BP: (!) 139/53 (!) 140/48 (!) 165/75   Pulse: 68  68   Resp: (!) 21  (!) 22   Temp:   99.3 F (37.4 C)   TempSrc:   Oral   SpO2: 95%  96%   Weight:    66.7 kg  Height:    5\' 3"  (1.6 m)  PainSc:   0-No pain    Chest: Bilaterally clear CVS: S1-S2 regular Abdomen: Soft nontender nondistended Extremities no edema Neurology: Nonfocal  COVID-19 Labs:  Lab Results  Component Value Date   CREATININE 5.01 (H) 09/13/2019   CREATININE 5.22 (H) 09/12/2019   CREATININE 3.50 (H) 05/07/2019     Recent Labs    09/12/19 1848 09/12/19 1945 09/13/19 0652 09/13/19 0754  DDIMER 3.08*  --   --  2.95*  FERRITIN  --  579* 520*  --   LDH 306*  --   --   --   CRP  --  14.6* 12.3*  --     Lab Results  Component Value Date   SARSCOV2NAA POSITIVE (A) 09/12/2019   [place] Not Detected 05/09/2019    Assessment and plan: Acute hypoxic respiratory failure secondary to COVID-19 pneumonia/COVID-19 gastroenteritis: Continue steroids-change to Solu-Medrol-continue Remdesivir.  Patient's procalcitonin is elevated-given immunosuppression-prudent to start at least a 5-day course of Rocephin/Zithromax.  Use as needed Imodium for diarrhea-which seems to have already improved.  She is just on 1 L of oxygen-if she  worsens-we will transfuse convalescent plasma.  Given her immunosuppressive history and history of chronic hepatitis C-not a candidate for Actemra.  AKI on CKD stage IV-s/p renal transplant on immunosuppressive's followed by Dr. Jimmy Footman: AKI likely hemodynamically mediated-in the setting of diarrhea.  Plan is to continue with IV fluids-supportive care-avoid nephrotoxic agents.  Discussed with pharmacy-her dosage of immunosuppressive medications appear to be appropriate.  Sending Prograf level.  This a.m.-had spoken with Dr. Rulon Eisenmenger care-if renal function does not improve-to consult nephrology.  Per patient-she has a functioning aVF left arm.  HIV (CD4 count 214 on 05/07/2019): Spoke with pharmacy-continue with ART-but reduce lamivudine to 50 mg daily.  Note-she was initially DNR-but request that she be changed to a full code.   Rest per H&P

## 2019-09-13 NOTE — ED Provider Notes (Signed)
I assumed care of this patient from Dr. Alvino Chapel.  Please see their note for further details of Hx, PE.  Briefly patient is a 73 y.o. female who presented with renal transplant patient here for neck pain, abdominal pain.  Presentation was suspicious for COVID-19 infection.  Pending confirmatory PCR.Marland Kitchen   Cover test positive.  Will need to be transferred to [facility].  Hospitalist admission.    Fatima Blank, MD 09/13/19 [phone]

## 2019-09-13 NOTE — H&P (Signed)
History and Physical    [name] [id number] DOB: 07-17-1946 DOA: 09/12/2019  PCP: Earlene Plater, MD  Patient coming from: Home.  Chief Complaint: Feeling weak and diarrhea.  HPI: [name] is a 73 y.o. female with history of HIV last CD4 count was around 214 in June 2020, diabetes mellitus type 2, renal transplant baseline creatinine is around 3.5 in June 2020, hypertension has been experiencing cough with nonspecific upper back pain with constant diarrhea over the last week and a half.  Had some nausea denies any abdominal pain.  Had subjective feeling of fever chills.  Has been able to take her medications.  But has been having poor appetite.  Given the symptoms patient's daughter brought her to the ER.  ED Course: In the ER patient was mildly febrile.  With temperature 100.3 F.  Chest x-ray showed bilateral infiltrates concerning for pneumonia.  Patient was mildly hypoxic requiring oxygen.  COVID-19 test came positive.  Patient's labs show creatinine has increased from baseline of 3.5-5.2.  Anion gap was 18.  Glucose 157.  Patient's inflammatory markers came positive with ferritin of 579 CRP 14.6 procalcitonin 2 lactic acid 1.2.  CBC count shows WBC of 4.5 hemoglobin 10.3 platelets 152.  Blood cultures were obtained.  EKG shows normal sinus rhythm.  Patient admitted for acute respiratory failure with hypoxia secondary to COVID-19 pneumonia and also acute renal failure.  I ordered 5 cc normal saline bolus in the ER.  Review of Systems: As per HPI, rest all negative.   Past Medical History:  Diagnosis Date  . CHF (congestive heart failure) ([place])   . Chronic kidney disease    ESRD secondary to DM, started HD in 2008, Dr. Jimmy Footman is her nephrologist, received renal transplant in 2013, no longer on dialysis (06/17/2018)  . Dialysis patient [facility])    T Th Sat  . Early cataracts, bilateral   . GERD (gastroesophageal reflux disease)   . Hepatitis C    untreated. VL  [id number] in 2008; pt reports this has been treated" (06/17/2018)  . High cholesterol   . History of blood transfusion 2013   "when I got the kidney" (06/17/2018)  . History of hepatitis C 08/04/2015  . HIV infection ([place]) 1998   Dx in 1998 in [place], she presented with PCP pneumonia at that time. Has been tried on multiple regimens by her PCP in [place] before./ She has been on current ART for years now. Moved to [facility] in 2008 and is following with Dr. Tommy Medal since then.   . Hypertension   . Influenza-like illness 05/27/2019  . 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 PONV (postoperative nausea and vomiting)   . Type II diabetes mellitus ([place])   . Wears glasses     Past Surgical History:  Procedure Laterality Date  . ABDOMINAL HYSTERECTOMY     and oopherectomy for fibroids  . BASCILIC VEIN TRANSPOSITION Left 01/29/2018   Procedure: RIGHT BRACIOCEPHALIC AV FISTULA;  Surgeon: Angelia Mould, MD;  Location: [place];  Service: Vascular;  Laterality: Left;  . CHOLECYSTECTOMY OPEN    . KIDNEY TRANSPLANT  2013     reports that she quit smoking about 22 years ago. Her smoking use included cigarettes. She has a 3.96 pack-year smoking history. She has never used smokeless tobacco. She reports previous alcohol use. She reports that she does not use drugs.  Allergies  Allergen Reactions  . Omeprazole Other (See Comments)    Interferes with  the absorption of rilipivirine  . Influenza Virus Vacc Split Pf Other (See Comments)    UNSPECIFIED REACTION  Alleged severe illness in [place]. Do not have documentation from [place] but pt always refuses vaccine on these grounds  . Influenza Vaccines Other (See Comments)    Other reaction(s): Hallucination    Family History  Problem Relation Age of Onset  . Diabetes Mother   . Cancer Father   . Cancer Brother     Prior to Admission medications   Medication Sig Start Date End Date Taking? Authorizing Provider  amLODipine (NORVASC) 10 MG tablet TAKE 1  TABLET(10 MG) BY MOUTH DAILY 03/24/19   Annia Belt, MD  aspirin 81 MG EC tablet Take 81 mg by mouth daily.      [provider]  calcitRIOL (ROCALTROL) 0.25 MCG capsule Take 0.25 mcg by mouth daily. 05/06/18   [provider]  carvedilol (COREG) 25 MG tablet Take 1 tablet (25 mg total) by mouth 2 (two) times daily with a meal. 07/17/19   Oda Kilts, MD  cinacalcet (SENSIPAR) 60 MG tablet Take 60 mg by mouth daily.    [provider]  dolutegravir (TIVICAY) 50 MG tablet Take 1 tablet (50 mg total) by mouth daily. 11/05/18   Truman Hayward, MD  fluticasone [facility]) 50 MCG/ACT nasal spray SHAKE LIQUID WELL AND USE 1 SPRAY IN EACH NOSTRIL DAILY Patient taking differently: Use 1 spray in each nostril daily as needed for allergies 01/07/18   Lorella Nimrod, MD  FREESTYLE TEST STRIPS test strip Use to check blood sugar 4 times daily. diag code E11.9. insulin dependent 06/02/19   Oda Kilts, MD  furosemide (LASIX) 80 MG tablet TK 1 T PO D 04/10/19   [provider]  Insulin Degludec-Liraglutide (XULTOPHY) 100-3.6 UNIT-MG/ML SOPN Inject 16 Units into the skin every morning. 07/17/19   Oda Kilts, MD  Insulin Pen Needle 32G X 4 MM MISC 16 Units by Does not apply route daily. 05/17/18   Lorella Nimrod, MD  lamivudine (EPIVIR) 100 MG tablet Take 1 tablet (100 mg total) by mouth daily. 11/05/18   Truman Hayward, MD  Lancets (FREESTYLE) lancets Use as instructed 05/17/18   Lorella Nimrod, MD  loratadine (CLARITIN) 10 MG tablet Take 1 tablet (10 mg total) by mouth daily. Patient not taking: Reported on 07/19/2018 07/20/17 07/20/18  Lorella Nimrod, MD  magnesium oxide (MAG-OX) 400 MG tablet Take 400 mg by mouth daily.    [provider]  mycophenolate (MYFORTIC) 180 MG EC tablet Take 540 mg by mouth 2 (two) times daily.    [provider]  pantoprazole (PROTONIX) 20 MG tablet Take 1 tablet (20 mg total) by mouth daily. 07/17/19    Oda Kilts, MD  potassium chloride SA (K-DUR,KLOR-CON) 20 MEQ tablet Take 20 mEq by mouth daily. 05/13/18   [provider]  predniSONE (DELTASONE) 5 MG tablet Take 5 mg by mouth daily with breakfast.    [provider]  rosuvastatin (CRESTOR) 20 MG tablet TAKE 1 TABLET(20 MG) BY MOUTH DAILY 07/30/19   Sid Falcon, MD  silver sulfADIAZINE (SILVADENE) 1 % cream Apply pea-sized amount to wound daily. 05/08/19   Evelina Bucy, DPM  tacrolimus (PROGRAF) 1 MG capsule Take 3-4 mg by mouth See admin instructions. Take 4 mg by mouth in the morning and take 3 mg by mouth in the evening 10/01/13   [provider]  VOLTAREN 1 % GEL  APPLY 2 GRAMS EXTERNALLY TO THE AFFECTED AREA FOUR TIMES DAILY 04/16/19   Lorella Nimrod, MD    Physical Exam: Constitutional: Moderately built and nourished. Vitals:   09/13/19 0245 09/13/19 0330 09/13/19 0400 09/13/19 0500  BP: (!) 147/72 (!) 146/74 (!) 165/68 (!) 158/77  Pulse: 71 71 69 72  Resp: (!) 27 13 (!) 27 (!) 24  Temp:      TempSrc:      SpO2: 97% 98% 98% 94%  Weight:      Height:       Eyes: Anicteric no pallor. ENMT: No discharge from the ears eyes nose or mouth. Neck: No mass felt.  No neck rigidity. Respiratory: No rhonchi or crepitations. Cardiovascular: S1-S2 heard. Abdomen: Soft nontender bowel sounds present. Musculoskeletal: No edema. Skin: No rash. Neurologic: Alert awake oriented to time place and person.  Moves all extremities. Psychiatric: Appears normal per normal affect.   Labs on Admission: I have personally reviewed following labs and imaging studies  CBC: Recent Labs  Lab 09/12/19 1848  WBC 4.5  NEUTROABS 3.5  HGB 10.3*  HCT 31.2*  MCV 89.1  PLT 0000000   Basic Metabolic Panel: Recent Labs  Lab 09/12/19 1848  NA 133*  K 3.7  CL 101  CO2 14*  GLUCOSE 157*  BUN 59*  CREATININE 5.22*  CALCIUM 9.0   GFR: Estimated Creatinine Clearance: 9.1 mL/min (A) (by C-G formula based on SCr of  5.22 mg/dL (H)). Liver Function Tests: Recent Labs  Lab 09/12/19 1848  AST 38  ALT 18  ALKPHOS 39  BILITOT 0.7  PROT 6.6  ALBUMIN 2.8*   No results for input(s): LIPASE, AMYLASE in the last 168 hours. No results for input(s): AMMONIA in the last 168 hours. Coagulation Profile: No results for input(s): INR, PROTIME in the last 168 hours. Cardiac Enzymes: No results for input(s): CKTOTAL, CKMB, CKMBINDEX, TROPONINI in the last 168 hours. BNP (last 3 results) No results for input(s): PROBNP in the last 8760 hours. HbA1C: No results for input(s): HGBA1C in the last 72 hours. CBG: No results for input(s): GLUCAP in the last 168 hours. Lipid Profile: Recent Labs    09/12/19 1848  TRIG 153*   Thyroid Function Tests: No results for input(s): TSH, T4TOTAL, FREET4, T3FREE, THYROIDAB in the last 72 hours. Anemia Panel: Recent Labs    09/12/19 1945  FERRITIN 579*   Urine analysis:    Component Value Date/Time   COLORURINE YELLOW 03/02/2014 [place] 03/02/2014 1441   LABSPEC 1.013 03/02/2014 1441   PHURINE 6.5 03/02/2014 1441   GLUCOSEU NEG 03/02/2014 1441   HGBUR NEG 03/02/2014 1441   BILIRUBINUR NEG 03/02/2014 1441   KETONESUR NEG 03/02/2014 1441   PROTEINUR 100 (A) 03/02/2014 1441   UROBILINOGEN 0.2 03/02/2014 1441   NITRITE NEG 03/02/2014 1441   LEUKOCYTESUR NEG 03/02/2014 1441   Sepsis Labs: @LABRCNTIP (procalcitonin:4,lacticidven:4) ) Recent Results (from the past 240 hour(s))  SARS CORONAVIRUS 2 (TAT 6-24 HRS) Nasopharyngeal Nasopharyngeal Swab     Status: Abnormal   Collection Time: 09/12/19  6:48 PM   Specimen: Nasopharyngeal Swab  Result Value Ref Range Status   SARS Coronavirus 2 POSITIVE (A) NEGATIVE Final    Comment: RESULT CALLED TO, READ BACK BY AND VERIFIED WITH: P. PEREZ,RN 0135 09/13/2019 T. TYSOR (NOTE) SARS-CoV-2 target nucleic acids are DETECTED. The SARS-CoV-2 RNA is generally detectable in upper and lower respiratory  specimens during the acute phase of infection. Positive results are indicative of active infection  with SARS-CoV-2. Clinical  correlation with patient history and other diagnostic information is necessary to determine patient infection status. Positive results do  not rule out bacterial infection or co-infection with other viruses. The expected result is Negative. Fact Sheet for Patients: SugarRoll.be Fact Sheet for Healthcare Providers: https://www.woods-mathews.com/ This test is not yet approved or cleared by the [place] FDA and  has been authorized for detection and/or diagnosis of SARS-CoV-2 by FDA under an Emergency Use Authorization (EUA). This EUA will remain  in effect (meaning this test can be used) for  the duration of the COVID-19 declaration under Section 564(b)(1) of the Act, 21 U.S.C. section 360bbb-3(b)(1), unless the authorization is terminated or revoked sooner. Performed at [facility], [place] [address] [place], [place] [address]      Radiological Exams on Admission: Dg Chest Portable 1 View  Result Date: 09/12/2019 CLINICAL DATA:  Cough and fever. EXAM: PORTABLE CHEST 1 VIEW COMPARISON:  06/17/2018 FINDINGS: There are patchy faint bilateral peripheral infiltrates in both mid zones and at both lung bases worrisome for viral pneumonia. Heart size and pulmonary vascularity are normal. No effusions. No significant bone abnormality. IMPRESSION: Patchy bilateral peripheral infiltrates in the mid zones and at both lung bases worrisome for viral pneumonia. No other significant abnormalities. Electronically Signed   By: Lorriane Shire M.D.   On: 09/12/2019 18:16    EKG: Independently reviewed.  Normal sinus rhythm.  Assessment/Plan Principal Problem:   Acute respiratory failure with hypoxia (HCC) Active Problems:   DM2 (diabetes mellitus, type 2) (HCC)   Essential hypertension, benign   Anemia   GERD (gastroesophageal  reflux disease)   Kidney transplant recipient   History of hepatitis C   AIDS (acquired immune deficiency syndrome) ([place])   COVID-19 virus infection    1. Acute respiratory failure with hypoxia secondary to COVID pneumonia -I have ordered Decadron 6 mg IV daily along with pharmacy consult for Remdesivir.  Closely observe.  Recheck inflammatory markers. 2. Acute renal failure on chronic kidney disease stage V renal transplant on immunosuppressants mycophenolate and tacrolimus.  I had ordered 5 cc normal saline bolus and normal saline infusion.  Check UA FENa and if creatinine does not improve with hydration may need to get nephrology input.  May have to discuss with nephrology about the immunosuppressant dosing.  Presently holding off prednisone since patient is acting Decadron.  I am holding off patient's Lasix for now until creatinine improves.  Patient clinically looks dehydrated. 3. HIV last CD4 count was around 214.  Continue antiretroviral. 4. Hypertension on amlodipine and Coreg. 5. Anemia likely from renal disease follow CBC. 6. Diabetes mellitus type 2 I have decreased patient's long-acting insulin dose from 16 to 5 units due to renal failure.  Follow CBGs closely for any hypoglycemia. 7. History of hepatitis C. 8. Hyperlipidemia on statins.  Patient's lamivudine dose has to be confirmed.  Given that patient has acute hypoxic respiratory failure acute renal failure will need more than 2 midnight stay and inpatient status.   DVT prophylaxis: Heparin. Code Status: DNR confirmed with patient. Family Communication: Need to discuss with patient's family. Disposition Plan: Home. Consults called: None. Admission status: Inpatient.   Rise Patience MD [facility] Pager [phone].  If 7PM-7AM, please contact night-coverage [email or web address] Password [email or web address]  09/13/2019, 5:37 AM

## 2019-09-14 LAB — GLUCOSE, CAPILLARY
Glucose-Capillary: 186 mg/dL — ABNORMAL HIGH (ref 70–99)
Glucose-Capillary: 183 mg/dL — ABNORMAL HIGH (ref 70–99)
Glucose-Capillary: 220 mg/dL — ABNORMAL HIGH (ref 70–99)
Glucose-Capillary: 209 mg/dL — ABNORMAL HIGH (ref 70–99)

## 2019-09-14 LAB — COMPREHENSIVE METABOLIC PANEL
ALT: 19 U/L (ref 0–44)
AST: 38 U/L (ref 15–41)
Albumin: 2.4 g/dL — ABNORMAL LOW (ref 3.5–5.0)
Alkaline Phosphatase: 36 U/L — ABNORMAL LOW (ref 38–126)
Anion gap: 16 — ABNORMAL HIGH (ref 5–15)
BUN: 68 mg/dL — ABNORMAL HIGH (ref 8–23)
CO2: 12 mmol/L — ABNORMAL LOW (ref 22–32)
Calcium: 8.3 mg/dL — ABNORMAL LOW (ref 8.9–10.3)
Chloride: 110 mmol/L (ref 98–111)
Creatinine, Ser: 4.22 mg/dL — ABNORMAL HIGH (ref 0.44–1.00)
GFR calc Af Amer: 12 mL/min — ABNORMAL LOW (ref >60)
GFR calc non Af Amer: 10 mL/min — ABNORMAL LOW (ref >60)
Glucose, Bld: 178 mg/dL — ABNORMAL HIGH (ref 70–99)
Potassium: 4.1 mmol/L (ref 3.5–5.1)
Sodium: 138 mmol/L (ref 135–145)
Total Bilirubin: 0.4 mg/dL (ref 0.3–1.2)
Total Protein: 6.1 g/dL — ABNORMAL LOW (ref 6.5–8.1)

## 2019-09-14 LAB — CBC WITH DIFFERENTIAL/PLATELET
Abs Immature Granulocytes: 0.01 10*3/uL (ref 0.00–0.07)
Basophils Absolute: 0 10*3/uL (ref 0.0–0.1)
Basophils Relative: 0 %
Eosinophils Absolute: 0 10*3/uL (ref 0.0–0.5)
Eosinophils Relative: 0 %
HCT: 28.1 % — ABNORMAL LOW (ref 36.0–46.0)
Hemoglobin: 8.9 g/dL — ABNORMAL LOW (ref 12.0–15.0)
Immature Granulocytes: 0 %
Lymphocytes Relative: 8 %
Lymphs Abs: 0.3 10*3/uL — ABNORMAL LOW (ref 0.7–4.0)
MCH: 28.4 pg (ref 26.0–34.0)
MCHC: 31.7 g/dL (ref 30.0–36.0)
MCV: 89.8 fL (ref 80.0–100.0)
Monocytes Absolute: 0.1 10*3/uL (ref 0.1–1.0)
Monocytes Relative: 4 %
Neutro Abs: 2.9 10*3/uL (ref 1.7–7.7)
Neutrophils Relative %: 88 %
Platelets: 182 10*3/uL (ref 150–400)
RBC: 3.13 MIL/uL — ABNORMAL LOW (ref 3.87–5.11)
RDW: 14.3 % (ref 11.5–15.5)
WBC: 3.3 10*3/uL — ABNORMAL LOW (ref 4.0–10.5)
nRBC: 0 % (ref 0.0–0.2)

## 2019-09-14 LAB — PROCALCITONIN: Procalcitonin: 1.75 ng/mL

## 2019-09-14 LAB — C-REACTIVE PROTEIN: CRP: 10.8 mg/dL — ABNORMAL HIGH (ref <1.0)

## 2019-09-14 LAB — FERRITIN: Ferritin: 540 ng/mL — ABNORMAL HIGH (ref 11–307)

## 2019-09-14 LAB — D-DIMER, QUANTITATIVE: D-Dimer, Quant: 1.91 ug/mL-FEU — ABNORMAL HIGH (ref 0.00–0.50)

## 2019-09-14 NOTE — Progress Notes (Addendum)
PROGRESS NOTE                                                                                                                                                                                                             Patient Demographics:    [name], is a 73 y.o. female, DOB - 1946-11-01, [id number]:[id number]  Outpatient Primary MD for the patient is Earlene Plater, MD    LOS - 1  Admit date - 09/12/2019    Chief Complaint  Patient presents with  . Neck Pain    fever  . Abdominal Pain       Brief Narrative  - 73 year old with history of renal transplantation on immunosuppressive's with chronic kidney disease stage IV, HIV on antiretrovirals, chronic hepatitis C, DM-2 on insulin-presenting with 6-7 history of cough-followed by exertional dyspnea and severe diarrhea.  Found to have acute hypoxic respiratory failure secondary COVID-19 pneumonia along with AKI on CKD stage III with metabolic acidosis.  Subsequently transferred to [facility].   Subjective:    [name] today has, No headache, No chest pain, No abdominal pain - No Nausea, No new weakness tingling or numbness, no Cough - SOB.    Assessment  & Plan :     1. Acute Covid 19 Viral gastroenteritis and pneumonitis during the ongoing 2020 Covid 19 Pandemic - she did have mild acute hypoxic respiratory failure.  Due to her underlying immunocompromised state and elevated procalcitonin she has been adequately started on IV Rocephin, azithromycin combination along with IV steroids and Remdisvir.  She is clinically improving.  Will trend inflammatory markers and procalcitonin.  Encouraged to sit up in chair in the daytime use flutter valve and I-S for pulmonary toiletry and prone in bed at night.  Note she is a poor candidate for Actemra due to elevated procalcitonin and history of chronic hepatitis C.   COVID-19 Labs  Recent Labs    09/12/19 1848  09/12/19 1945 09/13/19 0652 09/13/19 0754 09/14/19 0420  DDIMER 3.08*  --   --  2.95* 1.91*  FERRITIN  --  579* 520*  --  540*  LDH 306*  --   --   --   --   CRP  --  14.6* 12.3*  --  10.8*    Lab Results  Component Value Date   SARSCOV2NAA  POSITIVE (A) 09/12/2019   SARSCOV2NAA Not Detected 05/09/2019     Hepatic Function Latest Ref Rng & Units 09/14/2019 09/13/2019 09/12/2019  Total Protein 6.5 - 8.1 g/dL 6.1(L) 5.7(L) 6.6  Albumin 3.5 - 5.0 g/dL 2.4(L) 2.3(L) 2.8(L)  AST 15 - 41 U/L 38 31 38  ALT 0 - 44 U/L _0 Alk Phosphatase 38 - 126 U/L 36(L) 35(L) 39  Total Bilirubin 0.3 - 1.2 mg/dL 0.4 0.7 0.7  Bilirubin, Direct 0.0 - 0.3 mg/dL - - -        Component Value Date/Time   BNP 242.9 (H) 06/17/2018 1955      2.  AKI in a patient with status post renal transplant on immunosuppressive therapy with a baseline kidney function of CKD 4.  Baseline creatinine close to 3.5.  Under the care of Dr. Jimmy Footman.  AKI due to ATN caused by hypotension, hydrated with improvement in renal function, Prograf level is pending.  Continue home immunomodulators, steroid at a higher than home dose.  3.  History of HIV and chronic hepatitis C.  CD4 count on 05/07/2019 was 214.  Pharmacy dosing ART.  Lamivudine at 50 mg due to AKI.  4.  Essential hypertension currently on combination of Norvasc, Coreg continue and monitor.  5.  Deconditioning.  PT OT eval, may require SNF placement.  6.  GERD.  On PPI.  7.  Dyslipidemia.  Continue statin.  8.  DM type II.  Currently on Lantus and sliding scale will monitor and adjust.  Lab Results  Component Value Date   HGBA1C 6.9 (H) 06/17/2018   CBG (last 3)  Recent Labs    09/13/19 1620 09/13/19 2149 09/14/19 0746  GLUCAP 157* 139* 186*     Condition - Extremely Guarded  Family Communication  : Daughter on 09/14/19  Code Status : Full  Diet :   Diet Order            Diet renal/carb modified with fluid restriction Diet-HS Snack?  Nothing; Fluid restriction: 1200 mL Fluid; Room service appropriate? Yes; Fluid consistency: Thin  Diet effective now               Disposition Plan  :  Inpt  Consults  :  None  Procedures  :    PUD Prophylaxis : PPI  DVT Prophylaxis  :   Heparin    Lab Results  Component Value Date   PLT 182 09/14/2019    Inpatient Medications  Scheduled Meds: . amLODipine  10 mg Oral Daily  . aspirin  81 mg Oral Daily  . calcitRIOL  0.25 mcg Oral Daily  . carvedilol  25 mg Oral BID WC  . cinacalcet  60 mg Oral Daily  . dolutegravir  50 mg Oral Daily  . heparin  5,000 Units Subcutaneous Q8H  . insulin aspart  0-9 Units Subcutaneous TID WC  . insulin glargine  6 Units Subcutaneous QHS  . lamiVUDine  50 mg Oral Daily  . magnesium oxide  400 mg Oral Daily  . methylPREDNISolone (SOLU-MEDROL) injection  40 mg Intravenous BID  . mycophenolate  540 mg Oral BID  . pantoprazole  40 mg Oral Daily  . potassium chloride SA  20 mEq Oral Daily  . rosuvastatin  20 mg Oral q1800  . sodium bicarbonate  650 mg Oral BID  . tacrolimus  2 mg Oral Q24H   And  . tacrolimus  3 mg Oral Q24H   Continuous Infusions: . sodium chloride  100 mL/hr at 09/14/19 0515  . azithromycin Stopped (09/13/19 1800)  . cefTRIAXone (ROCEPHIN)  IV Stopped (09/13/19 1836)  . remdesivir 100 mg in NS 250 mL     PRN Meds:.acetaminophen **OR** acetaminophen, benzonatate, ondansetron **OR** ondansetron (ZOFRAN) IV  Antibiotics  :    Anti-infectives (From admission, onward)   Start     Dose/Rate Route Frequency Ordered Stop   09/14/19 1600  remdesivir 100 mg in sodium chloride 0.9 % 250 mL IVPB     100 mg 500 mL/hr over 30 Minutes Intravenous Every 24 hours 09/13/19 1222 09/18/19 1559   09/13/19 1800  cefTRIAXone (ROCEPHIN) 1 g in sodium chloride 0.9 % 100 mL IVPB     1 g 200 mL/hr over 30 Minutes Intravenous Every 24 hours 09/13/19 1631     09/13/19 1730  azithromycin (ZITHROMAX) 500 mg in sodium chloride 0.9 % 250 mL  IVPB     500 mg 250 mL/hr over 60 Minutes Intravenous Every 24 hours 09/13/19 1631     09/13/19 1700  lamiVUDine (EPIVIR) 10 MG/ML solution 50 mg     50 mg Oral Daily 09/13/19 1621     09/13/19 1615  lamivudine (EPIVIR) tablet 50 mg  Status:  Discontinued     50 mg Oral Daily 09/13/19 1612 09/13/19 1621   09/13/19 1600  dolutegravir (TIVICAY) tablet 50 mg     50 mg Oral Daily 09/13/19 1536     09/13/19 1400  remdesivir 200 mg in sodium chloride 0.9 % 250 mL IVPB     200 mg 500 mL/hr over 30 Minutes Intravenous Once 09/13/19 1222 09/13/19 1558   09/13/19 1000  dolutegravir (TIVICAY) tablet 50 mg  Status:  Discontinued     50 mg Oral Daily 09/13/19 0535 09/13/19 1536       Time Spent in minutes  [place] M.D on 09/14/2019 at 10:35 AM  To page go to [email or web address] - password [email or web address]  [facility] -  Office  [phone]     See all Orders from today for further details    Objective:   Vitals:   09/13/19 1941 09/14/19 0040 09/14/19 0500 09/14/19 0742  BP: (!) 169/89  140/64 (!) 157/74  Pulse:    67  Resp:    (!) 21  Temp: 97.9 F (36.6 C) (!) 97.5 F (36.4 C) 98.1 F (36.7 C) 98 F (36.7 C)  TempSrc: Oral Oral Oral Oral  SpO2:    96%  Weight:   65.2 kg   Height:        Wt Readings from Last 3 Encounters:  09/14/19 65.2 kg  05/27/19 65.8 kg  11/05/18 64.4 kg     Intake/Output Summary (Last 24 hours) at 09/14/2019 1035 Last data filed at 09/14/2019 0400 Gross per 24 hour  Intake 3454.94 ml  Output 6 ml  Net 3448.94 ml     Physical Exam  Awake Alert, Oriented X 3, No new F.N deficits, Normal affect [place].AT,PERRAL Supple Neck,No JVD, No cervical lymphadenopathy appriciated.  Symmetrical Chest wall movement, Good air movement bilaterally, CTAB RRR,No Gallops,Rubs or new Murmurs, No Parasternal Heave +ve B.Sounds, Abd Soft, No tenderness, No organomegaly appriciated, No rebound - guarding or rigidity. No Cyanosis, Clubbing or edema, No new  Rash or bruise       Data Review:    CBC Recent Labs  Lab 09/12/19 1848 09/13/19 0652 09/14/19 0420  WBC 4.5 4.1 3.3*  HGB 10.3* 9.5* 8.9*  HCT 31.2* 28.6* 28.1*  PLT 152 146* 182  MCV 89.1 90.2 89.8  MCH 29.4 30.0 28.4  MCHC 33.0 33.2 31.7  RDW 14.1 14.0 14.3  LYMPHSABS 0.5* 0.5* 0.3*  MONOABS 0.5 0.5 0.1  EOSABS 0.0 0.0 0.0  BASOSABS 0.0 0.0 0.0    Chemistries  Recent Labs  Lab 09/12/19 1848 09/13/19 0652 09/14/19 0420  NA 133* 136 138  K 3.7 3.5 4.1  CL 101 105 110  CO2 14* 17* 12*  GLUCOSE 157* 98 178*  BUN 59* 64* 68*  CREATININE 5.22* 5.01* 4.22*  CALCIUM 9.0 8.9 8.3*  AST 38 31 38  ALT _0 ALKPHOS 39 35* 36*  BILITOT 0.7 0.7 0.4   ------------------------------------------------------------------------------------------------------------------ Recent Labs    09/12/19 1848  TRIG 153*    Lab Results  Component Value Date   HGBA1C 6.9 (H) 06/17/2018   ------------------------------------------------------------------------------------------------------------------ No results for input(s): TSH, T4TOTAL, T3FREE, THYROIDAB in the last 72 hours.  Invalid input(s): FREET3  Cardiac Enzymes No results for input(s): CKMB, TROPONINI, MYOGLOBIN in the last 168 hours.  Invalid input(s): CK ------------------------------------------------------------------------------------------------------------------    Component Value Date/Time   BNP 242.9 (H) 06/17/2018 1955    Micro Results Recent Results (from the past 240 hour(s))  Culture, blood (routine x 2)     Status: None (Preliminary result)   Collection Time: 09/12/19  6:46 PM   Specimen: BLOOD RIGHT HAND  Result Value Ref Range Status   Specimen Description BLOOD RIGHT HAND  Final   Special Requests   Final    BOTTLES DRAWN AEROBIC ONLY Blood Culture adequate volume   Culture   Final    NO GROWTH < 24 HOURS Performed at [facility], [address] [place], [place] [address]     Report Status PENDING  Incomplete  SARS CORONAVIRUS 2 (TAT 6-24 HRS) Nasopharyngeal Nasopharyngeal Swab     Status: Abnormal   Collection Time: 09/12/19  6:48 PM   Specimen: Nasopharyngeal Swab  Result Value Ref Range Status   SARS Coronavirus 2 POSITIVE (A) NEGATIVE Final    Comment: RESULT CALLED TO, READ BACK BY AND VERIFIED WITH: P. PEREZ,RN 0135 09/13/2019 T. TYSOR (NOTE) SARS-CoV-2 target nucleic acids are DETECTED. The SARS-CoV-2 RNA is generally detectable in upper and lower respiratory specimens during the acute phase of infection. Positive results are indicative of active infection with SARS-CoV-2. Clinical  correlation with patient history and other diagnostic information is necessary to determine patient infection status. Positive results do  not rule out bacterial infection or co-infection with other viruses. The expected result is Negative. Fact Sheet for Patients: SugarRoll.be Fact Sheet for Healthcare Providers: https://www.woods-mathews.com/ This test is not yet approved or cleared by the [place] FDA and  has been authorized for detection and/or diagnosis of SARS-CoV-2 by FDA under an Emergency Use Authorization (EUA). This EUA will remain  in effect (meaning this test can be used) for  the duration of the COVID-19 declaration under Section 564(b)(1) of the Act, 21 U.S.C. section 360bbb-3(b)(1), unless the authorization is terminated or revoked sooner. Performed at [facility], [place] [address], [place], [place] [address]   Culture, blood (routine x 2)     Status: None (Preliminary result)   Collection Time: 09/12/19  7:39 PM   Specimen: BLOOD  Result Value Ref Range Status   Specimen Description BLOOD RIGHT ANTECUBITAL  Final   Special Requests   Final    BOTTLES DRAWN AEROBIC AND ANAEROBIC Blood Culture adequate volume  Culture   Final    NO GROWTH < 24 HOURS Performed at [facility], [place]  [address], [place], [place] [address]    Report Status PENDING  Incomplete    Radiology Reports Dg Chest Portable 1 View  Result Date: 09/12/2019 CLINICAL DATA:  Cough and fever. EXAM: PORTABLE CHEST 1 VIEW COMPARISON:  06/17/2018 FINDINGS: There are patchy faint bilateral peripheral infiltrates in both mid zones and at both lung bases worrisome for viral pneumonia. Heart size and pulmonary vascularity are normal. No effusions. No significant bone abnormality. IMPRESSION: Patchy bilateral peripheral infiltrates in the mid zones and at both lung bases worrisome for viral pneumonia. No other significant abnormalities. Electronically Signed   By: Lorriane Shire M.D.   On: 09/12/2019 18:16

## 2019-09-14 NOTE — Evaluation (Signed)
Occupational Therapy Evaluation Patient Details Name: [name] MRN: [id number] DOB: February 03, 1946 Today's Date: 09/14/2019    History of Present Illness 73 yo female presenting with coughing, poor appetite, and diarrhea. Tested COVID +. PMH including HIV, DM type 2, renal transplant (June 2020), and HTN.   Clinical Impression   PTA, pt was living alone and was performing ADLs and IADLs. Pt currently requiring Min A for UB ADLs, Min-Mod A for LB ADLs, and Min Guard A for functional mobility with RW. Pt presenting with significant fatigues and poor activity tolerance requiring seated rest breaks after ~30 seconds of activity (as seen during toileting and mobility). SpO2 >92% on 2L O2; attempted RA and pt dropping to 86% with noted increased in SOB and fatigue so placed back on 2L for remaining ADLs. Pt will require further acute OT to facilitate safe dc. Pt reporting her daughter brought her to the hospital, however, then reports she does not have support at home. Due to decreased support and activity tolerance, recommend dc to SNF for further OT to optimize safety, independence with ADLs, and return to PLOF.      Follow Up Recommendations  SNF;Supervision/Assistance - 24 hour    Equipment Recommendations  3 in 1 bedside commode;Tub/shower seat(defer to next venue)    Recommendations for Other Services PT consult     Precautions / Restrictions Precautions Precautions: Fall;Other (comment)(Quickly fatigues)      Mobility Bed Mobility Overal bed mobility: Needs Assistance Bed Mobility: Sit to Sidelying         Sit to sidelying: Min guard General bed mobility comments: Pt lowering her trunk and elevating BLEs with Min Guard A for safety but no physical A needed. Requesting to stay sidelying  Transfers Overall transfer level: Needs assistance Equipment used: Rolling walker (2 wheeled) Transfers: Sit to/from Stand Sit to Stand: Min guard         General transfer comment:  Min Guard A for safety    Balance Overall balance assessment: Mild deficits observed, not formally tested                                         ADL either performed or assessed with clinical judgement   ADL Overall ADL's : Needs assistance/impaired Eating/Feeding: Set up;Sitting   Grooming: Wash/dry face;Set up;Supervision/safety;Sitting Grooming Details (indicate cue type and reason): Pt washing her face and hands (with hand sanitizer) while seated with supervision Upper Body Bathing: Minimal assistance;Sitting   Lower Body Bathing: Minimal assistance;Sit to/from stand   Upper Body Dressing : Minimal assistance;Sitting   Lower Body Dressing: Moderate assistance;Sit to/from stand   Toilet Transfer: Min guard;Ambulation;RW;BSC Toilet Transfer Details (indicate cue type and reason): Min Guard A for safety with sit<>Stand. Cues for hand placement Toileting- Clothing Manipulation and Hygiene: Min guard;Sit to/from stand Toileting - Clothing Manipulation Details (indicate cue type and reason): Pt performing peri care after BM with Min Guard A for safety. Very fatigues and required several rest breaks. Pt also with water bowels and incontience.     Functional mobility during ADLs: Min guard;Rolling walker(short distance) General ADL Comments: Pt presenting with poor balance, strength, cognition, and activity tolerance all impacting her safe performance of ADLs.     Vision Baseline Vision/History: Wears glasses       Perception     Praxis      Pertinent Vitals/Pain Pain Assessment: Faces Faces Pain  Scale: Hurts even more Pain Location: At bottom; during toileting Pain Descriptors / Indicators: Discomfort;Guarding;Grimacing;Sharp Pain Intervention(s): Monitored during session     Hand Dominance     Extremity/Trunk Assessment Upper Extremity Assessment Upper Extremity Assessment: Generalized weakness   Lower Extremity Assessment Lower Extremity  Assessment: Defer to PT evaluation       Communication Communication Communication: HOH   Cognition Arousal/Alertness: Lethargic(Very fatigued) Behavior During Therapy: WFL for tasks assessed/performed Overall Cognitive Status: No family/caregiver present to determine baseline cognitive functioning Area of Impairment: Attention;Following commands;Problem solving                   Current Attention Level: Sustained   Following Commands: Follows one step commands with increased time;Follows multi-step commands with increased time     Problem Solving: Slow processing;Requires verbal cues General Comments: Feel pt presents with decreased attention, problem solving, and processing due to fatigue and lethargy with illness. Will continue to assess cognition. Pt requiring increased time for answering questions, participating in conversation, or following cues. Pt also closering her eyes and leaning on her forearms (on the RW) due to fatigue and decreased activity tolerance. Pt oriented to place and situation stating "I knew I was really sick because today is the first day I have turned on the TV, and I am a TV fanatic."   General Comments  HR 90s. RR 20s. SpO2 >92% on 2L. Placign pt on RA and she dropped to 86% and noting increased SOB. Returned to 2L as pt fatigues quickly    Exercises     Shoulder Instructions      Home Living Family/patient expects to be discharged to:: Private residence Living Arrangements: Alone Available Help at Discharge: Other (Comment)(When asked about support, pt states "there is no one") Type of Home: Apartment Home Access: Level entry     Home Layout: One level     Bathroom Shower/Tub: Teacher, early years/pre: Standard     Home Equipment: None          Prior Functioning/Environment Level of Independence: Independent        Comments: Pt reports she was performing ADLs and IADLs at home before getting sick        OT  Problem List: Decreased strength;Decreased range of motion;Decreased activity tolerance;Impaired balance (sitting and/or standing);Decreased cognition;Decreased knowledge of use of DME or AE;Decreased knowledge of precautions      OT Treatment/Interventions: Self-care/ADL training;Therapeutic exercise;Energy conservation;DME and/or AE instruction;Therapeutic activities;Patient/family education    OT Goals(Current goals can be found in the care plan section) Acute Rehab OT Goals Patient Stated Goal: "Rest and get better." OT Goal Formulation: With patient Time For Goal Achievement: 09/28/19 Potential to Achieve Goals: Good  OT Frequency: Min 3X/week   Barriers to D/C:            Co-evaluation              AM-PAC OT "6 Clicks" Daily Activity     Outcome Measure Help from another person eating meals?: None Help from another person taking care of personal grooming?: None Help from another person toileting, which includes using toliet, bedpan, or urinal?: A Little Help from another person bathing (including washing, rinsing, drying)?: A Little Help from another person to put on and taking off regular upper body clothing?: A Little Help from another person to put on and taking off regular lower body clothing?: A Lot 6 Click Score: 19   End of Session Equipment Utilized During Treatment:  Rolling walker;Oxygen(2L via [place]) Nurse Communication: Mobility status  Activity Tolerance: Patient limited by fatigue Patient left: in bed;with call bell/phone within reach;with bed alarm set  OT Visit Diagnosis: Unsteadiness on feet (R26.81);Other abnormalities of gait and mobility (R26.89);Muscle weakness (generalized) (M62.81);Pain Pain - part of body: (Bottom)                Time: [id number] OT Time Calculation (min): 34 min Charges:  OT General Charges $OT Visit: 1 Visit OT Evaluation $OT Eval Moderate Complexity: 1 Mod OT Treatments $Self Care/Home Management : 8-22 mins  Suzanna Zahn MSOT, OTR/L Acute Rehab Pager: [phone] Office: [place] 09/14/2019, 3:34 PM

## 2019-09-14 NOTE — Progress Notes (Signed)
Daughter called for updates and would like to speak to MD

## 2019-09-14 NOTE — Progress Notes (Signed)
Called and updated [name]'s daughter, [name], on her condition.  She was very thankful and has no further questions or concerns at this time.

## 2019-09-15 ENCOUNTER — Encounter: Payer: Medicare Other | Admitting: Internal Medicine

## 2019-09-15 LAB — COMPREHENSIVE METABOLIC PANEL
ALT: 16 U/L (ref 0–44)
AST: 32 U/L (ref 15–41)
Albumin: 2.3 g/dL — ABNORMAL LOW (ref 3.5–5.0)
Alkaline Phosphatase: 36 U/L — ABNORMAL LOW (ref 38–126)
Anion gap: 15 (ref 5–15)
BUN: 72 mg/dL — ABNORMAL HIGH (ref 8–23)
CO2: 13 mmol/L — ABNORMAL LOW (ref 22–32)
Calcium: 8.1 mg/dL — ABNORMAL LOW (ref 8.9–10.3)
Chloride: 111 mmol/L (ref 98–111)
Creatinine, Ser: 4.14 mg/dL — ABNORMAL HIGH (ref 0.44–1.00)
GFR calc Af Amer: 12 mL/min — ABNORMAL LOW (ref >60)
GFR calc non Af Amer: 10 mL/min — ABNORMAL LOW (ref >60)
Glucose, Bld: 223 mg/dL — ABNORMAL HIGH (ref 70–99)
Potassium: 4 mmol/L (ref 3.5–5.1)
Sodium: 139 mmol/L (ref 135–145)
Total Bilirubin: 0.3 mg/dL (ref 0.3–1.2)
Total Protein: 5.8 g/dL — ABNORMAL LOW (ref 6.5–8.1)

## 2019-09-15 LAB — CBC WITH DIFFERENTIAL/PLATELET
Abs Immature Granulocytes: 0.03 10*3/uL (ref 0.00–0.07)
Basophils Absolute: 0 10*3/uL (ref 0.0–0.1)
Basophils Relative: 0 %
Eosinophils Absolute: 0 10*3/uL (ref 0.0–0.5)
Eosinophils Relative: 0 %
HCT: 27.4 % — ABNORMAL LOW (ref 36.0–46.0)
Hemoglobin: 8.6 g/dL — ABNORMAL LOW (ref 12.0–15.0)
Immature Granulocytes: 1 %
Lymphocytes Relative: 5 %
Lymphs Abs: 0.3 10*3/uL — ABNORMAL LOW (ref 0.7–4.0)
MCH: 28 pg (ref 26.0–34.0)
MCHC: 31.4 g/dL (ref 30.0–36.0)
MCV: 89.3 fL (ref 80.0–100.0)
Monocytes Absolute: 0.2 10*3/uL (ref 0.1–1.0)
Monocytes Relative: 5 %
Neutro Abs: 4.8 10*3/uL (ref 1.7–7.7)
Neutrophils Relative %: 89 %
Platelets: 191 10*3/uL (ref 150–400)
RBC: 3.07 MIL/uL — ABNORMAL LOW (ref 3.87–5.11)
RDW: 14.5 % (ref 11.5–15.5)
WBC: 5.4 10*3/uL (ref 4.0–10.5)
nRBC: 0 % (ref 0.0–0.2)

## 2019-09-15 LAB — GLUCOSE, CAPILLARY
Glucose-Capillary: 218 mg/dL — ABNORMAL HIGH (ref 70–99)
Glucose-Capillary: 213 mg/dL — ABNORMAL HIGH (ref 70–99)
Glucose-Capillary: 293 mg/dL — ABNORMAL HIGH (ref 70–99)
Glucose-Capillary: 223 mg/dL — ABNORMAL HIGH (ref 70–99)

## 2019-09-15 LAB — PROCALCITONIN: Procalcitonin: 1.41 ng/mL

## 2019-09-15 LAB — MAGNESIUM
Magnesium: 1.9 mg/dL (ref 1.7–2.4)
Magnesium: 1.9 mg/dL (ref 1.7–2.4)

## 2019-09-15 LAB — FERRITIN: Ferritin: 775 ng/mL — ABNORMAL HIGH (ref 11–307)

## 2019-09-15 LAB — C-REACTIVE PROTEIN: CRP: 7.4 mg/dL — ABNORMAL HIGH (ref <1.0)

## 2019-09-15 LAB — D-DIMER, QUANTITATIVE: D-Dimer, Quant: 1.52 ug/mL-FEU — ABNORMAL HIGH (ref 0.00–0.50)

## 2019-09-15 LAB — TACROLIMUS LEVEL: Tacrolimus (FK506) - LabCorp: 30.8 ng/mL — ABNORMAL HIGH (ref 2.0–20.0)

## 2019-09-15 LAB — ABO/RH: ABO/RH(D): O POS

## 2019-09-15 MED ORDER — METHYLPREDNISOLONE SODIUM SUCC 40 MG IJ SOLR
40.0000 mg | Freq: Every day | INTRAMUSCULAR | Status: DC
  Administered 2019-09-16: 40 mg via INTRAVENOUS
  Filled 2019-09-15: qty 1

## 2019-09-15 MED ORDER — SODIUM CHLORIDE 0.9 % IV SOLN
INTRAVENOUS | Status: AC
  Administered 2019-09-15: 10:00:00 via INTRAVENOUS

## 2019-09-15 NOTE — Progress Notes (Signed)
Patient's daughter, Lavella Lemons, updated by telephone.  All questions welcomed and answered.

## 2019-09-15 NOTE — Progress Notes (Signed)
SATURATION QUALIFICATIONS: (This note is used to comply with regulatory documentation for home oxygen)  Patient Saturations on Room Air at Rest = 100%%  Patient Saturations on Room Air while Ambulating = 86% after 5 feet  Patient Saturations on 2 Liters of oxygen while Ambulating = 89-90%  Please briefly explain why patient needs home oxygen:  To maintain oxygen saturation level to support her ability to mobilize and complete functional tasks.     Barry Brunner, PT

## 2019-09-15 NOTE — Plan of Care (Signed)
Ms. Leazenby remains in the PCU and is resting comfortably in bed.  No acute events overnight.  QT is ~500.  See flowsheet for assessment details and MAR for medication administration.  Will continue to monitor.    Problem: Education: Goal: Knowledge of risk factors and measures for prevention of condition will improve Outcome: Progressing   Problem: Coping: Goal: Psychosocial and spiritual needs will be supported Outcome: Progressing   Problem: Respiratory: Goal: Will maintain a patent airway Outcome: Progressing Goal: Complications related to the disease process, condition or treatment will be avoided or minimized Outcome: Progressing   Problem: Education: Goal: Knowledge of disease and its progression will improve Outcome: Progressing   Problem: Health Behavior/Discharge Planning: Goal: Ability to manage health-related needs will improve Outcome: Progressing   Problem: Clinical Measurements: Goal: Complications related to the disease process or treatment will be avoided or minimized Outcome: Progressing Goal: Dialysis access will remain free of complications Outcome: Progressing   Problem: Activity: Goal: Activity intolerance will improve Outcome: Progressing   Problem: Fluid Volume: Goal: Fluid volume balance will be maintained or improved Outcome: Progressing   Problem: Nutritional: Goal: Ability to make appropriate dietary choices will improve Outcome: Progressing   Problem: Respiratory: Goal: Respiratory symptoms related to disease process will be avoided Outcome: Progressing   Problem: Self-Concept: Goal: Body image disturbance will be avoided or minimized Outcome: Progressing   Problem: Urinary Elimination: Goal: Progression of disease will be identified and treated Outcome: Progressing

## 2019-09-15 NOTE — TOC Initial Note (Signed)
Transition of Care [facility]) - Initial/Assessment Note    Patient Details  Name: [name] MRN: [id number] Date of Birth: October 10, 1946  [facility] CM/SW Contact:    Carles Collet, RN Phone Number: 09/15/2019, 3:07 PM  Clinical Narrative:                Patient admitted from home.  Current recommendations are for SNF. Patient does not want SNF, she would prefer to go home. She lives at home alone. We discussed her speaking with her family for assistance after DC. She stated that her daughter will likely be able to stay with her and was going to call her and set that up.  Will likely need home oxygen and HH services. Will need to confirm transportation to home.  CM will continue to follow.     Expected Discharge Plan: (TBD) Barriers to Discharge: Continued Medical Work up   Patient Goals and CMS Choice        Expected Discharge Plan and Services Expected Discharge Plan: (TBD)                                              Prior Living Arrangements/Services                       Activities of Daily Living Home Assistive Devices/Equipment: None ADL Screening (condition at time of admission) Patient's cognitive ability adequate to safely complete daily activities?: Yes Is the patient deaf or have difficulty hearing?: No Does the patient have difficulty seeing, even when wearing glasses/contacts?: No Does the patient have difficulty concentrating, remembering, or making decisions?: No Patient able to express need for assistance with ADLs?: Yes Does the patient have difficulty dressing or bathing?: No Independently performs ADLs?: Yes (appropriate for developmental age) Does the patient have difficulty walking or climbing stairs?: No Weakness of Legs: Both Weakness of Arms/Hands: Both  Permission Sought/Granted                  Emotional Assessment              Admission diagnosis:  AKI (acute kidney injury) ([place])  [N17.9] Viral pneumonia [J12.9] Renal transplant recipient [Z94.0] COVID-19 virus infection [U07.1] Acute respiratory failure with hypoxia ([place]) [J96.01] Patient Active Problem List   Diagnosis Date Noted  . COVID-19 virus infection 09/13/2019  . Acute respiratory failure with hypoxia ([place]) 09/13/2019  . AKI (acute kidney injury) ([place])   . Influenza-like illness 05/27/2019  . Fever 05/08/2019  . AIDS (acquired immune deficiency syndrome) ([place]) 07/19/2018  . History of hepatitis C 08/04/2015  . Kidney transplant recipient 10/02/2014  . Tingling in extremities 06/23/2014  . Systemic lupus erythematosus (SLE) inhibitor ([place]) 09/24/2012  . Seasonal allergic rhinitis 09/20/2012  . Health care maintenance 09/20/2012  . GERD (gastroesophageal reflux disease) 08/27/2012  . Hyperlipidemia 08/27/2012  . Hearing loss of both ears 06/26/2012  . Nonspecific abnormal cardiovascular system function study 06/07/2012  . Anemia 05/22/2012  . ACQUIRED ABSENCE OF BOTH CERVIX AND UTERUS 09/21/2010  . UNSPECIFIED SYPHILIS 05/06/2008  . HEPATITIS C 11/12/2007  . Essential hypertension, benign 11/12/2007  . DM2 (diabetes mellitus, type 2) ([place]) 12/04/1993   PCP:  Earlene Plater, MD Pharmacy:   [facility] [place], [place] - [address] AT [place]  [address] [place] [address] Phone: [phone] Fax: [phone]     Social Determinants of Health (SDOH) Interventions    Readmission Risk Interventions No flowsheet data found.

## 2019-09-15 NOTE — Progress Notes (Signed)
PROGRESS NOTE                                                                                                                                                                                                             Patient Demographics:    [name], is a 73 y.o. female, DOB - Aug 05, 1946, [id number]  Outpatient Primary MD for the patient is Earlene Plater, MD    LOS - 2  Admit date - 09/12/2019    Chief Complaint  Patient presents with  . Neck Pain    fever  . Abdominal Pain       Brief Narrative  - 73 year old with history of renal transplantation on immunosuppressive's with chronic kidney disease stage IV, HIV on antiretrovirals, chronic hepatitis C, DM-2 on insulin-presenting with 6-7 history of cough-followed by exertional dyspnea and severe diarrhea.  Found to have acute hypoxic respiratory failure secondary COVID-19 pneumonia along with AKI on CKD stage III with metabolic acidosis.  Subsequently transferred to [place].   Subjective:   Patient in bed, appears comfortable, denies any headache, no fever, no chest pain or pressure, no shortness of breath , no abdominal pain. No focal weakness.   Assessment  & Plan :     1. Acute Covid 19 Viral gastroenteritis and pneumonitis during the ongoing 2020 Covid 19 Pandemic - she did have mild acute hypoxic respiratory failure.  Due to her underlying immunocompromised state and elevated procalcitonin she has been adequately started on IV Rocephin, azithromycin combination along with IV steroids and Remdisvir.  She is clinically improving.  Will trend inflammatory markers and procalcitonin.  Encouraged to sit up in chair in the daytime use flutter valve and I-S for pulmonary toiletry and prone in bed at night.  Note she is a poor candidate for Actemra due to elevated procalcitonin and history of chronic hepatitis C.   COVID-19 Labs  Recent Labs   09/12/19 1848  09/13/19 0652 09/13/19 0754 09/14/19 0420 09/15/19 0123  DDIMER 3.08*  --   --  2.95* 1.91* 1.52*  FERRITIN  --    < > 520*  --  540* 775*  LDH 306*  --   --   --   --   --   CRP  --    < > 12.3*  --  10.8* 7.4*   < > =  values in this interval not displayed.    Lab Results  Component Value Date   SARSCOV2NAA POSITIVE (A) 09/12/2019   [place] Not Detected 05/09/2019     Hepatic Function Latest Ref Rng & Units 09/15/2019 09/14/2019 09/13/2019  Total Protein 6.5 - 8.1 g/dL 5.8(L) 6.1(L) 5.7(L)  Albumin 3.5 - 5.0 g/dL 2.3(L) 2.4(L) 2.3(L)  AST 15 - 41 U/L 32 38 31  ALT 0 - 44 U/L _0 Alk Phosphatase 38 - 126 U/L 36(L) 36(L) 35(L)  Total Bilirubin 0.3 - 1.2 mg/dL 0.3 0.4 0.7  Bilirubin, Direct 0.0 - 0.3 mg/dL - - -        Component Value Date/Time   BNP 242.9 (H) 06/17/2018 1955      2.  AKI in a patient with status post renal transplant on immunosuppressive therapy with a baseline kidney function of CKD 4.  Baseline creatinine is between 3.5  to 4.  Under the care of Dr. Jimmy Footman.  AKI due to ATN caused by hypotension, hydrated with improvement in renal function, Prograf level is pending.  Continue home immunomodulators, steroid at a higher than home dose.  3.  History of HIV and chronic hepatitis C.  CD4 count on 05/07/2019 was 214.  Pharmacy dosing ART.  Lamivudine at 50 mg due to AKI.  4.  Essential hypertension currently on combination of Norvasc, Coreg continue and monitor.  5.  Deconditioning.  PT OT eval, may require SNF placement.  6.  GERD.  On PPI.  7.  Dyslipidemia.  Continue statin.  8.  DM type II.  Currently on Lantus and sliding scale will monitor and adjust.  Lab Results  Component Value Date   HGBA1C 6.9 (H) 06/17/2018   CBG (last 3)  Recent Labs    09/14/19 1731 09/14/19 2132 09/15/19 0701  GLUCAP 220* 209* 218*     Condition - Fair  Family Communication  : Daughter on 09/14/19  Code Status : Full  Diet :    Diet Order            Diet renal/carb modified with fluid restriction Diet-HS Snack? Nothing; Fluid restriction: 1200 mL Fluid; Room service appropriate? Yes; Fluid consistency: Thin  Diet effective now               Disposition Plan  :  Inpt  Consults  :  None  Procedures  :    PUD Prophylaxis : PPI  DVT Prophylaxis  :   Heparin    Lab Results  Component Value Date   PLT 191 09/15/2019    Inpatient Medications  Scheduled Meds: . amLODipine  10 mg Oral Daily  . aspirin  81 mg Oral Daily  . calcitRIOL  0.25 mcg Oral Daily  . carvedilol  25 mg Oral BID WC  . cinacalcet  60 mg Oral Daily  . dolutegravir  50 mg Oral Daily  . heparin  5,000 Units Subcutaneous Q8H  . insulin aspart  0-9 Units Subcutaneous TID WC  . insulin glargine  6 Units Subcutaneous QHS  . lamiVUDine  50 mg Oral Daily  . magnesium oxide  400 mg Oral Daily  . [START ON 09/16/2019] methylPREDNISolone (SOLU-MEDROL) injection  40 mg Intravenous Daily  . mycophenolate  540 mg Oral BID  . pantoprazole  40 mg Oral Daily  . potassium chloride SA  20 mEq Oral Daily  . rosuvastatin  20 mg Oral q1800  . sodium bicarbonate  650 mg Oral BID  . tacrolimus  2 mg Oral Q24H   And  . tacrolimus  3 mg Oral Q24H   Continuous Infusions: . sodium chloride    . azithromycin 500 mg (09/14/19 1733)  . cefTRIAXone (ROCEPHIN)  IV 1 g (09/14/19 1916)  . remdesivir 100 mg in NS 250 mL Stopped (09/14/19 1557)   PRN Meds:.acetaminophen **OR** acetaminophen, benzonatate, ondansetron **OR** ondansetron (ZOFRAN) IV  Antibiotics  :    Anti-infectives (From admission, onward)   Start     Dose/Rate Route Frequency Ordered Stop   09/14/19 1600  remdesivir 100 mg in sodium chloride 0.9 % 250 mL IVPB     100 mg 500 mL/hr over 30 Minutes Intravenous Every 24 hours 09/13/19 1222 09/18/19 1559   09/13/19 1800  cefTRIAXone (ROCEPHIN) 1 g in sodium chloride 0.9 % 100 mL IVPB     1 g 200 mL/hr over 30 Minutes Intravenous Every 24  hours 09/13/19 1631     09/13/19 1730  azithromycin (ZITHROMAX) 500 mg in sodium chloride 0.9 % 250 mL IVPB     500 mg 250 mL/hr over 60 Minutes Intravenous Every 24 hours 09/13/19 1631     09/13/19 1700  lamiVUDine (EPIVIR) 10 MG/ML solution 50 mg     50 mg Oral Daily 09/13/19 1621     09/13/19 1615  lamivudine (EPIVIR) tablet 50 mg  Status:  Discontinued     50 mg Oral Daily 09/13/19 1612 09/13/19 1621   09/13/19 1600  dolutegravir (TIVICAY) tablet 50 mg     50 mg Oral Daily 09/13/19 1536     09/13/19 1400  remdesivir 200 mg in sodium chloride 0.9 % 250 mL IVPB     200 mg 500 mL/hr over 30 Minutes Intravenous Once 09/13/19 1222 09/13/19 1558   09/13/19 1000  dolutegravir (TIVICAY) tablet 50 mg  Status:  Discontinued     50 mg Oral Daily 09/13/19 0535 09/13/19 1536       Time Spent in minutes  [place] M.D on 09/15/2019 at 10:11 AM  To page go to [email or web address] - password [facility]  [facility] -  Office  [phone]     See all Orders from today for further details    Objective:   Vitals:   09/15/19 0452 09/15/19 0500 09/15/19 0600 09/15/19 0718  BP:    137/63  Pulse:  (!) 59 68 64  Resp:  18 (!) 24 20  Temp:    98.1 F (36.7 C)  TempSrc:    Oral  SpO2:  96% 93% 94%  Weight: 69.1 kg     Height:        Wt Readings from Last 3 Encounters:  09/15/19 69.1 kg  05/27/19 65.8 kg  11/05/18 64.4 kg     Intake/Output Summary (Last 24 hours) at 09/15/2019 1011 Last data filed at 09/15/2019 0400 Gross per 24 hour  Intake 1377.26 ml  Output 300 ml  Net 1077.26 ml     Physical Exam  Awake Alert, Oriented X 3, No new F.N deficits, Normal affect [place].AT,PERRAL Supple Neck,No JVD, No cervical lymphadenopathy appriciated.  Symmetrical Chest wall movement, Good air movement bilaterally, CTAB RRR,No Gallops, Rubs or new Murmurs, No Parasternal Heave +ve B.Sounds, Abd Soft, No tenderness, No organomegaly appriciated, No rebound - guarding or rigidity. No  Cyanosis, Clubbing or edema, No new Rash or bruise    Data Review:    CBC Recent Labs  Lab 09/12/19 1848 09/13/19 0652 09/14/19 0420 09/15/19 0123  WBC 4.5 4.1  3.3* 5.4  HGB 10.3* 9.5* 8.9* 8.6*  HCT 31.2* 28.6* 28.1* 27.4*  PLT 152 146* 182 191  MCV 89.1 90.2 89.8 89.3  MCH 29.4 30.0 28.4 28.0  MCHC 33.0 33.2 31.7 31.4  RDW 14.1 14.0 14.3 14.5  LYMPHSABS 0.5* 0.5* 0.3* 0.3*  MONOABS 0.5 0.5 0.1 0.2  EOSABS 0.0 0.0 0.0 0.0  BASOSABS 0.0 0.0 0.0 0.0    Chemistries  Recent Labs  Lab 09/12/19 1848 09/13/19 0652 09/14/19 0420 09/15/19 0123  NA 133* 136 138 139  K 3.7 3.5 4.1 4.0  CL 101 105 110 111  CO2 14* 17* 12* 13*  GLUCOSE 157* 98 178* 223*  BUN 59* 64* 68* 72*  CREATININE 5.22* 5.01* 4.22* 4.14*  CALCIUM 9.0 8.9 8.3* 8.1*  MG  --   --  1.9 1.9  AST 38 31 38 32  ALT _0 ALKPHOS 39 35* 36* 36*  BILITOT 0.7 0.7 0.4 0.3   ------------------------------------------------------------------------------------------------------------------ Recent Labs    09/12/19 1848  TRIG 153*    Lab Results  Component Value Date   HGBA1C 6.9 (H) 06/17/2018   ------------------------------------------------------------------------------------------------------------------ No results for input(s): TSH, T4TOTAL, T3FREE, THYROIDAB in the last 72 hours.  Invalid input(s): FREET3  Cardiac Enzymes No results for input(s): CKMB, TROPONINI, MYOGLOBIN in the last 168 hours.  Invalid input(s): CK ------------------------------------------------------------------------------------------------------------------    Component Value Date/Time   BNP 242.9 (H) 06/17/2018 1955    Micro Results Recent Results (from the past 240 hour(s))  Culture, blood (routine x 2)     Status: None (Preliminary result)   Collection Time: 09/12/19  6:46 PM   Specimen: BLOOD RIGHT HAND  Result Value Ref Range Status   Specimen Description BLOOD RIGHT HAND  Final   Special Requests    Final    BOTTLES DRAWN AEROBIC ONLY Blood Culture adequate volume   Culture   Final    NO GROWTH 3 DAYS Performed at [facility], [address], [place], [place] [address]    Report Status PENDING  Incomplete  SARS CORONAVIRUS 2 (TAT 6-24 HRS) Nasopharyngeal Nasopharyngeal Swab     Status: Abnormal   Collection Time: 09/12/19  6:48 PM   Specimen: Nasopharyngeal Swab  Result Value Ref Range Status   SARS Coronavirus 2 POSITIVE (A) NEGATIVE Final    Comment: RESULT CALLED TO, READ BACK BY AND VERIFIED WITH: P. PEREZ,RN 0135 09/13/2019 T. TYSOR (NOTE) SARS-CoV-2 target nucleic acids are DETECTED. The SARS-CoV-2 RNA is generally detectable in upper and lower respiratory specimens during the acute phase of infection. Positive results are indicative of active infection with SARS-CoV-2. Clinical  correlation with patient history and other diagnostic information is necessary to determine patient infection status. Positive results do  not rule out bacterial infection or co-infection with other viruses. The expected result is Negative. Fact Sheet for Patients: SugarRoll.be Fact Sheet for Healthcare Providers: https://www.woods-mathews.com/ This test is not yet approved or cleared by the [place] FDA and  has been authorized for detection and/or diagnosis of SARS-CoV-2 by FDA under an Emergency Use Authorization (EUA). This EUA will remain  in effect (meaning this test can be used) for  the duration of the COVID-19 declaration under Section 564(b)(1) of the Act, 21 U.S.C. section 360bbb-3(b)(1), unless the authorization is terminated or revoked sooner. Performed at [facility], [place] [address], [place], [place] [address]   Culture, blood (routine x 2)     Status: None (Preliminary result)  Collection Time: 09/12/19  7:39 PM   Specimen: BLOOD  Result Value Ref Range Status   Specimen Description BLOOD RIGHT ANTECUBITAL  Final    Special Requests   Final    BOTTLES DRAWN AEROBIC AND ANAEROBIC Blood Culture adequate volume   Culture   Final    NO GROWTH 3 DAYS Performed at [facility], [address], [place], [place] [address]    Report Status PENDING  Incomplete    Radiology Reports Dg Chest Portable 1 View  Result Date: 09/12/2019 CLINICAL DATA:  Cough and fever. EXAM: PORTABLE CHEST 1 VIEW COMPARISON:  06/17/2018 FINDINGS: There are patchy faint bilateral peripheral infiltrates in both mid zones and at both lung bases worrisome for viral pneumonia. Heart size and pulmonary vascularity are normal. No effusions. No significant bone abnormality. IMPRESSION: Patchy bilateral peripheral infiltrates in the mid zones and at both lung bases worrisome for viral pneumonia. No other significant abnormalities. Electronically Signed   By: Lorriane Shire M.D.   On: 09/12/2019 18:16

## 2019-09-15 NOTE — Evaluation (Signed)
Physical Therapy Evaluation Patient Details Name: [name] MRN: [id number] DOB: [birth year] Today's Date: 09/15/2019   History of Present Illness  73 yo female presenting with coughing, poor appetite, and diarrhea. Tested COVID +. PMH including HIV, DM type 2, renal transplant (June 2020), and HTN.  Clinical Impression   Pt admitted with above diagnosis. Patient much more alert today compared to OT evaluation 10/11. She does desaturate on room air (86%) and required 2L of O2 with ambulation x 22 ft. She had dyspnea to 31 bpm and had to walk very slowly due to respiratory status with slow velocity negatively impacting her balance. Began education on use of RW. May benefit from rollator to assist with independence in the kitchen. Unlikely she will be able to discharge home with very intermittent assist, however she is not yet wanting to commit to SNF. Pt currently with functional limitations due to the deficits listed below (see PT Problem List). Pt will benefit from skilled PT to increase their independence and safety with mobility to allow discharge to the venue listed below.       Follow Up Recommendations SNF;Supervision - Intermittent(pt currently wants home w/ assist; open to SNF if needed)    Equipment Recommendations  Rolling walker with 5" wheels;Other (comment)(vs rollator)    Recommendations for Other Services       Precautions / Restrictions Precautions Precautions: Fall;Other (comment)(Quickly fatigues) Precaution Comments: watch sats with activity Restrictions Weight Bearing Restrictions: No      Mobility  Bed Mobility                  Transfers Overall transfer level: Needs assistance Equipment used: Rolling walker (2 wheeled);None Transfers: Sit to/from Stand Sit to Stand: Min guard         General transfer comment: Min Guard A for safety and vc  Ambulation/Gait Ambulation/Gait assistance: Min assist;Min guard Gait Distance (Feet): 22 Feet(x  2; seated rest on toilet) Assistive device: Rolling walker (2 wheeled);1 person hand held assist Gait Pattern/deviations: Step-to pattern;Step-through pattern;Decreased stride length;Shuffle     General Gait Details: very slow to control dyspnea; required max cues to use pursed lip breathing  Stairs            Wheelchair Mobility    Modified Rankin (Stroke Patients Only)       Balance Overall balance assessment: Mild deficits observed, not formally tested                                           Pertinent Vitals/Pain Pain Assessment: No/denies pain    Home Living Family/patient expects to be discharged to:: Private residence Living Arrangements: Alone Available Help at Discharge: Other (Comment);Friend(s);Family(dtr lives local, but works; church members) Type of Home: Apartment Home Access: Level entry     Home Layout: One [place]: None      Prior Function Level of Independence: Independent         Comments: Pt reports she was performing ADLs and IADLs at home before getting sick; denied balance problems and then stated she holds onto furniture when she walks     Hand Dominance        Extremity/Trunk Assessment   Upper Extremity Assessment Upper Extremity Assessment: Defer to OT evaluation    Lower Extremity Assessment Lower Extremity Assessment: Generalized weakness    Cervical / Trunk Assessment Cervical / Trunk Assessment:  Kyphotic  Communication   Communication: HOH  Cognition Arousal/Alertness: Awake/alert Behavior During Therapy: WFL for tasks assessed/performed Overall Cognitive Status: No family/caregiver present to determine baseline cognitive functioning                     Current Attention Level: Sustained   Following Commands: Follows one step commands consistently(difficult to assess multiple steps due to severe HOH)       General Comments: no cognitive deficits noted; pt mindful of  all lines, tubes      General Comments General comments (skin integrity, edema, etc.): At rest HR 65, RR 15, SaO2 100% on room air (pt wearing [place], but O2 turned off); with activity decr to 86% on room air, up to 87% on 1L and 90% on 2L while walking; RR max 31 HR 74; after seated on 2L decr to 86% and gradually incr to 90%; gradually returned to RA and sats 100% at end of session    Exercises Other Exercises Other Exercises: ankle pumps x 10 reps    Assessment/Plan    PT Assessment Patient needs continued PT services  PT Problem List Decreased strength;Decreased activity tolerance;Decreased balance;Decreased mobility;Decreased knowledge of use of DME;Cardiopulmonary status limiting activity       PT Treatment Interventions DME instruction;Gait training;Functional mobility training;Therapeutic activities;Therapeutic exercise;Balance training;Patient/family education    PT Goals (Current goals can be found in the Care Plan section)  Acute Rehab PT Goals Patient Stated Goal: get back home where I can take care of myself PT Goal Formulation: With patient Time For Goal Achievement: 09/29/19 Potential to Achieve Goals: Good    Frequency Min 3X/week(pt wants to d/c home with limited support)   Barriers to discharge Decreased caregiver support limited assist (dtr works; church members)    Co-evaluation               AM-PAC PT "6 Clicks" Mobility  Outcome Measure Help needed turning from your back to your side while in a flat bed without using bedrails?: A Little Help needed moving from lying on your back to sitting on the side of a flat bed without using bedrails?: A Little Help needed moving to and from a bed to a chair (including a wheelchair)?: A Little Help needed standing up from a chair using your arms (e.g., wheelchair or bedside chair)?: A Little Help needed to walk in hospital room?: A Little Help needed climbing 3-5 steps with a railing? : A Lot 6 Click Score: 17     End of Session Equipment Utilized During Treatment: Oxygen Activity Tolerance: Treatment limited secondary to medical complications (Comment)(incr RR and decr sats) Patient left: in chair;with call bell/phone within reach Nurse Communication: Mobility status;Other (comment)(desats) PT Visit Diagnosis: Muscle weakness (generalized) (M62.81);Difficulty in walking, not elsewhere classified (R26.2)    Time: [id number] PT Time Calculation (min) (ACUTE ONLY): 42 min   Charges:   PT Evaluation $PT Eval Moderate Complexity: 1 Mod PT Treatments $Gait Training: 8-22 mins $Self Care/Home Management: 8-22          Barry Brunner, PT      [place] [facility] Solveig Fangman 09/15/2019, 10:04 AM

## 2019-09-16 LAB — COMPREHENSIVE METABOLIC PANEL
ALT: 17 U/L (ref 0–44)
AST: 31 U/L (ref 15–41)
Albumin: 2.5 g/dL — ABNORMAL LOW (ref 3.5–5.0)
Alkaline Phosphatase: 39 U/L (ref 38–126)
Anion gap: 17 — ABNORMAL HIGH (ref 5–15)
BUN: 80 mg/dL — ABNORMAL HIGH (ref 8–23)
CO2: 12 mmol/L — ABNORMAL LOW (ref 22–32)
Calcium: 8.7 mg/dL — ABNORMAL LOW (ref 8.9–10.3)
Chloride: 111 mmol/L (ref 98–111)
Creatinine, Ser: 4.3 mg/dL — ABNORMAL HIGH (ref 0.44–1.00)
GFR calc Af Amer: 11 mL/min — ABNORMAL LOW (ref >60)
GFR calc non Af Amer: 10 mL/min — ABNORMAL LOW (ref >60)
Glucose, Bld: 238 mg/dL — ABNORMAL HIGH (ref 70–99)
Potassium: 4.4 mmol/L (ref 3.5–5.1)
Sodium: 140 mmol/L (ref 135–145)
Total Bilirubin: 0.3 mg/dL (ref 0.3–1.2)
Total Protein: 6.3 g/dL — ABNORMAL LOW (ref 6.5–8.1)

## 2019-09-16 LAB — CBC WITH DIFFERENTIAL/PLATELET
Abs Immature Granulocytes: 0.05 10*3/uL (ref 0.00–0.07)
Basophils Absolute: 0 10*3/uL (ref 0.0–0.1)
Basophils Relative: 0 %
Eosinophils Absolute: 0 10*3/uL (ref 0.0–0.5)
Eosinophils Relative: 0 %
HCT: 31.4 % — ABNORMAL LOW (ref 36.0–46.0)
Hemoglobin: 10.1 g/dL — ABNORMAL LOW (ref 12.0–15.0)
Immature Granulocytes: 1 %
Lymphocytes Relative: 3 %
Lymphs Abs: 0.2 10*3/uL — ABNORMAL LOW (ref 0.7–4.0)
MCH: 29 pg (ref 26.0–34.0)
MCHC: 32.2 g/dL (ref 30.0–36.0)
MCV: 90.2 fL (ref 80.0–100.0)
Monocytes Absolute: 0.3 10*3/uL (ref 0.1–1.0)
Monocytes Relative: 5 %
Neutro Abs: 6.1 10*3/uL (ref 1.7–7.7)
Neutrophils Relative %: 91 %
Platelets: 239 10*3/uL (ref 150–400)
RBC: 3.48 MIL/uL — ABNORMAL LOW (ref 3.87–5.11)
RDW: 14.8 % (ref 11.5–15.5)
WBC: 6.7 10*3/uL (ref 4.0–10.5)
nRBC: 0 % (ref 0.0–0.2)

## 2019-09-16 LAB — MAGNESIUM: Magnesium: 2.2 mg/dL (ref 1.7–2.4)

## 2019-09-16 LAB — GLUCOSE, CAPILLARY
Glucose-Capillary: 262 mg/dL — ABNORMAL HIGH (ref 70–99)
Glucose-Capillary: 293 mg/dL — ABNORMAL HIGH (ref 70–99)
Glucose-Capillary: 244 mg/dL — ABNORMAL HIGH (ref 70–99)
Glucose-Capillary: 209 mg/dL — ABNORMAL HIGH (ref 70–99)
Glucose-Capillary: 223 mg/dL — ABNORMAL HIGH (ref 70–99)

## 2019-09-16 LAB — D-DIMER, QUANTITATIVE: D-Dimer, Quant: 2.5 ug/mL-FEU — ABNORMAL HIGH (ref 0.00–0.50)

## 2019-09-16 LAB — C-REACTIVE PROTEIN: CRP: 5.5 mg/dL — ABNORMAL HIGH (ref <1.0)

## 2019-09-16 LAB — PROCALCITONIN: Procalcitonin: 1.01 ng/mL

## 2019-09-16 MED ORDER — FUROSEMIDE 10 MG/ML IJ SOLN
20.0000 mg | Freq: Once | INTRAMUSCULAR | Status: AC
  Administered 2019-09-16: 20 mg via INTRAVENOUS
  Filled 2019-09-16: qty 2

## 2019-09-16 MED ORDER — METHYLPREDNISOLONE SODIUM SUCC 40 MG IJ SOLR
20.0000 mg | Freq: Every day | INTRAMUSCULAR | Status: DC
  Administered 2019-09-17 – 2019-09-19 (×3): 20 mg via INTRAVENOUS
  Filled 2019-09-16 (×3): qty 1

## 2019-09-16 NOTE — Progress Notes (Signed)
PROGRESS NOTE                                                                                                                                                                                                             Patient Demographics:    [name], is a 73 y.o. female, DOB - [birth year], ZNB:[id number]  Outpatient Primary MD for the patient is Earlene Plater, MD    LOS - 3  Admit date - 09/12/2019    Chief Complaint  Patient presents with  . Neck Pain    fever  . Abdominal Pain       Brief Narrative  - 73 year old with history of renal transplantation on immunosuppressive's with chronic kidney disease stage IV, HIV on antiretrovirals, chronic hepatitis C, DM-2 on insulin-presenting with 6-7 history of cough-followed by exertional dyspnea and severe diarrhea.  Found to have acute hypoxic respiratory failure secondary COVID-19 pneumonia along with AKI on CKD stage III with metabolic acidosis.  Subsequently transferred to [facility].   Subjective:   Patient in bed, appears comfortable, denies any headache, no fever, no chest pain or pressure, no shortness of breath , no abdominal pain. No focal weakness.   Assessment  & Plan :     1. Acute Covid 19 Viral gastroenteritis and pneumonitis during the ongoing 2020 Covid 19 Pandemic - she did have mild acute hypoxic respiratory failure.  Due to her underlying immunocompromised state and elevated procalcitonin she has been adequately started on IV Rocephin, azithromycin combination along with IV steroids and Remdisvir.  She is clinically improving.  Will trend inflammatory markers and procalcitonin.  Encouraged to sit up in chair in the daytime use flutter valve and I-S for pulmonary toiletry and prone in bed at night.  Note she is a poor candidate for Actemra due to elevated procalcitonin and history of chronic hepatitis C.   COVID-19 Labs  Recent Labs   09/14/19 0420 09/15/19 0123 09/16/19 0405  DDIMER 1.91* 1.52* 2.50*  FERRITIN 540* 775*  --   CRP 10.8* 7.4* 5.5*    Lab Results  Component Value Date   SARSCOV2NAA POSITIVE (A) 09/12/2019   [place] Not Detected 05/09/2019     Hepatic Function Latest Ref Rng & Units 09/16/2019 09/15/2019 09/14/2019  Total Protein 6.5 - 8.1 g/dL 6.3(L) 5.8(L) 6.1(L)  Albumin 3.5 - 5.0  g/dL 2.5(L) 2.3(L) 2.4(L)  AST 15 - 41 U/L 31 32 38  ALT 0 - 44 U/L 17 16 19   Alk Phosphatase 38 - 126 U/L 39 36(L) 36(L)  Total Bilirubin 0.3 - 1.2 mg/dL 0.3 0.3 0.4  Bilirubin, Direct 0.0 - 0.3 mg/dL - - -        Component Value Date/Time   BNP 242.9 (H) 06/17/2018 1955      2.  AKI in a patient with status post renal transplant on immunosuppressive therapy with a baseline kidney function of CKD 4.  Baseline creatinine is between 3.5  to 4.  Under the care of Dr. Jimmy Footman.  AKI due to ATN caused by hypotension, she has been adequately hydrated and has received about 3 L of fluid thus far, creatinine has uptake to little bit on 09/16/2019, she takes 80 mg of Lasix at home at this time will hold her fluids and give her 20 of IV Lasix on 09/16/2019 and repeat BMP tomorrow, Prograf level is pending.  Continue home immunomodulators, steroid at a higher than home dose.  3.  History of HIV and chronic hepatitis C.  CD4 count on 05/07/2019 was 214.  Pharmacy dosing ART.  Lamivudine at 50 mg due to AKI.  4.  Essential hypertension currently on combination of Norvasc, Coreg continue and monitor.  5.  Deconditioning.  PT OT eval, may require SNF placement.  6.  GERD.  On PPI.  7.  Dyslipidemia.  Continue statin.  8.  DM type II.  Currently on Lantus and sliding scale will monitor and adjust.  Lab Results  Component Value Date   HGBA1C 6.9 (H) 06/17/2018   CBG (last 3)  Recent Labs    09/15/19 1640 09/15/19 2010 09/16/19 0739  GLUCAP 293* 223* 262*     Condition - Fair  Family Communication  :  Daughter on 09/14/19, 09/16/19  Code Status : Full  Diet :   Diet Order            Diet renal/carb modified with fluid restriction Diet-HS Snack? Nothing; Fluid restriction: 1200 mL Fluid; Room service appropriate? Yes; Fluid consistency: Thin  Diet effective now               Disposition Plan  :  Inpt  Consults  :  None  Procedures  :    PUD Prophylaxis : PPI  DVT Prophylaxis  :   Heparin    Lab Results  Component Value Date   PLT 239 09/16/2019    Inpatient Medications  Scheduled Meds: . amLODipine  10 mg Oral Daily  . aspirin  81 mg Oral Daily  . calcitRIOL  0.25 mcg Oral Daily  . carvedilol  25 mg Oral BID WC  . cinacalcet  60 mg Oral Daily  . dolutegravir  50 mg Oral Daily  . furosemide  20 mg Intravenous Once  . heparin  5,000 Units Subcutaneous Q8H  . insulin aspart  0-9 Units Subcutaneous TID WC  . insulin glargine  6 Units Subcutaneous QHS  . lamiVUDine  50 mg Oral Daily  . magnesium oxide  400 mg Oral Daily  . [START ON 09/17/2019] methylPREDNISolone (SOLU-MEDROL) injection  20 mg Intravenous Daily  . mycophenolate  540 mg Oral BID  . pantoprazole  40 mg Oral Daily  . potassium chloride SA  20 mEq Oral Daily  . rosuvastatin  20 mg Oral q1800  . sodium bicarbonate  650 mg Oral BID  . tacrolimus  2  mg Oral Q24H   And  . tacrolimus  3 mg Oral Q24H   Continuous Infusions: . azithromycin Stopped (09/16/19 0201)  . cefTRIAXone (ROCEPHIN)  IV Stopped (09/16/19 0428)  . remdesivir 100 mg in NS 250 mL Stopped (09/16/19 0201)   PRN Meds:.acetaminophen **OR** [DISCONTINUED] acetaminophen, benzonatate, [DISCONTINUED] ondansetron **OR** ondansetron (ZOFRAN) IV  Antibiotics  :    Anti-infectives (From admission, onward)   Start     Dose/Rate Route Frequency Ordered Stop   09/14/19 1600  remdesivir 100 mg in sodium chloride 0.9 % 250 mL IVPB     100 mg 500 mL/hr over 30 Minutes Intravenous Every 24 hours 09/13/19 1222 09/18/19 1559   09/13/19 1800   cefTRIAXone (ROCEPHIN) 1 g in sodium chloride 0.9 % 100 mL IVPB     1 g 200 mL/hr over 30 Minutes Intravenous Every 24 hours 09/13/19 1631     09/13/19 1730  azithromycin (ZITHROMAX) 500 mg in sodium chloride 0.9 % 250 mL IVPB     500 mg 250 mL/hr over 60 Minutes Intravenous Every 24 hours 09/13/19 1631     09/13/19 1700  lamiVUDine (EPIVIR) 10 MG/ML solution 50 mg     50 mg Oral Daily 09/13/19 1621     09/13/19 1615  lamivudine (EPIVIR) tablet 50 mg  Status:  Discontinued     50 mg Oral Daily 09/13/19 1612 09/13/19 1621   09/13/19 1600  dolutegravir (TIVICAY) tablet 50 mg     50 mg Oral Daily 09/13/19 1536     09/13/19 1400  remdesivir 200 mg in sodium chloride 0.9 % 250 mL IVPB     200 mg 500 mL/hr over 30 Minutes Intravenous Once 09/13/19 1222 09/13/19 1558   09/13/19 1000  dolutegravir (TIVICAY) tablet 50 mg  Status:  Discontinued     50 mg Oral Daily 09/13/19 0535 09/13/19 1536       Time Spent in minutes  Edgar M.D on 09/16/2019 at 9:59 AM  To page go to [email or web address] - password [place]  [facility] -  Office  [phone]     See all Orders from today for further details    Objective:   Vitals:   09/15/19 2038 09/15/19 2315 09/16/19 0421 09/16/19 0700  BP:   (!) 146/62 (!) 142/62  Pulse:   (!) 58 63  Resp: 20 20 20  (!) 25  Temp: 97.9 F (36.6 C) 97.7 F (36.5 C) 98.1 F (36.7 C) 97.8 F (36.6 C)  TempSrc:    Oral  SpO2:   93% 94%  Weight:      Height:        Wt Readings from Last 3 Encounters:  09/15/19 69.1 kg  05/27/19 65.8 kg  11/05/18 64.4 kg     Intake/Output Summary (Last 24 hours) at 09/16/2019 0959 Last data filed at 09/16/2019 0428 Gross per 24 hour  Intake 1122.63 ml  Output 350 ml  Net 772.63 ml     Physical Exam  Awake Alert, Oriented X 3, No new F.N deficits, Normal affect [place].AT,PERRAL Supple Neck,No JVD, No cervical lymphadenopathy appriciated.  Symmetrical Chest wall movement, Good air movement  bilaterally, CTAB RRR,No Gallops, Rubs or new Murmurs, No Parasternal Heave +ve B.Sounds, Abd Soft, No tenderness, No organomegaly appriciated, No rebound - guarding or rigidity. No Cyanosis, Clubbing or edema, No new Rash or bruise     Data Review:    CBC Recent Labs  Lab 09/12/19 1848 09/13/19 8101 09/14/19 0420 09/15/19 0123  09/16/19 0405  WBC 4.5 4.1 3.3* 5.4 6.7  HGB 10.3* 9.5* 8.9* 8.6* 10.1*  HCT 31.2* 28.6* 28.1* 27.4* 31.4*  PLT 152 146* 182 191 239  MCV 89.1 90.2 89.8 89.3 90.2  MCH 29.4 30.0 28.4 28.0 29.0  MCHC 33.0 33.2 31.7 31.4 32.2  RDW 14.1 14.0 14.3 14.5 14.8  LYMPHSABS 0.5* 0.5* 0.3* 0.3* 0.2*  MONOABS 0.5 0.5 0.1 0.2 0.3  EOSABS 0.0 0.0 0.0 0.0 0.0  BASOSABS 0.0 0.0 0.0 0.0 0.0    Chemistries  Recent Labs  Lab 09/12/19 1848 09/13/19 0652 09/14/19 0420 09/15/19 0123 09/16/19 0405  NA 133* 136 138 139 140  K 3.7 3.5 4.1 4.0 4.4  CL 101 105 110 111 111  CO2 14* 17* 12* 13* 12*  GLUCOSE 157* 98 178* 223* 238*  BUN 59* 64* 68* 72* 80*  CREATININE 5.22* 5.01* 4.22* 4.14* 4.30*  CALCIUM 9.0 8.9 8.3* 8.1* 8.7*  MG  --   --  1.9 1.9 2.2  AST 38 31 38 32 31  ALT 18 17 19 16 17   ALKPHOS 39 35* 36* 36* 39  BILITOT 0.7 0.7 0.4 0.3 0.3   ------------------------------------------------------------------------------------------------------------------ No results for input(s): CHOL, HDL, LDLCALC, TRIG, CHOLHDL, LDLDIRECT in the last 72 hours.  Lab Results  Component Value Date   HGBA1C 6.9 (H) 06/17/2018   ------------------------------------------------------------------------------------------------------------------ No results for input(s): TSH, T4TOTAL, T3FREE, THYROIDAB in the last 72 hours.  Invalid input(s): FREET3  Cardiac Enzymes No results for input(s): CKMB, TROPONINI, MYOGLOBIN in the last 168 hours.  Invalid input(s): CK  ------------------------------------------------------------------------------------------------------------------    Component Value Date/Time   BNP 242.9 (H) 06/17/2018 1955    Micro Results Recent Results (from the past 240 hour(s))  Culture, blood (routine x 2)     Status: None (Preliminary result)   Collection Time: 09/12/19  6:46 PM   Specimen: BLOOD RIGHT HAND  Result Value Ref Range Status   Specimen Description BLOOD RIGHT HAND  Final   Special Requests   Final    BOTTLES DRAWN AEROBIC ONLY Blood Culture adequate volume   Culture   Final    NO GROWTH 3 DAYS Performed at [facility], [address] [place], [place] [address]    Report Status PENDING  Incomplete  SARS CORONAVIRUS 2 (TAT 6-24 HRS) Nasopharyngeal Nasopharyngeal Swab     Status: Abnormal   Collection Time: 09/12/19  6:48 PM   Specimen: Nasopharyngeal Swab  Result Value Ref Range Status   SARS Coronavirus 2 POSITIVE (A) NEGATIVE Final    Comment: RESULT CALLED TO, READ BACK BY AND VERIFIED WITH: P. PEREZ,RN 0135 09/13/2019 T. TYSOR (NOTE) SARS-CoV-2 target nucleic acids are DETECTED. The SARS-CoV-2 RNA is generally detectable in upper and lower respiratory specimens during the acute phase of infection. Positive results are indicative of active infection with SARS-CoV-2. Clinical  correlation with patient history and other diagnostic information is necessary to determine patient infection status. Positive results do  not rule out bacterial infection or co-infection with other viruses. The expected result is Negative. Fact Sheet for Patients: SugarRoll.be Fact Sheet for Healthcare Providers: https://www.woods-mathews.com/ This test is not yet approved or cleared by the [place] FDA and  has been authorized for detection and/or diagnosis of SARS-CoV-2 by FDA under an Emergency Use Authorization (EUA). This EUA will remain  in effect (meaning this test  can be used) for  the duration of the COVID-19 declaration under Section 564(b)(1) of the Act, 21 U.S.C. section 360bbb-3(b)(1),  unless the authorization is terminated or revoked sooner. Performed at [facility], [place] [address], [place], [place] [address]   Culture, blood (routine x 2)     Status: None (Preliminary result)   Collection Time: 09/12/19  7:39 PM   Specimen: BLOOD  Result Value Ref Range Status   Specimen Description BLOOD RIGHT ANTECUBITAL  Final   Special Requests   Final    BOTTLES DRAWN AEROBIC AND ANAEROBIC Blood Culture adequate volume   Culture   Final    NO GROWTH 3 DAYS Performed at [facility], [place] [address] [place], [place] [address]    Report Status PENDING  Incomplete    Radiology Reports Dg Chest Portable 1 View  Result Date: 09/12/2019 CLINICAL DATA:  Cough and fever. EXAM: PORTABLE CHEST 1 VIEW COMPARISON:  06/17/2018 FINDINGS: There are patchy faint bilateral peripheral infiltrates in both mid zones and at both lung bases worrisome for viral pneumonia. Heart size and pulmonary vascularity are normal. No effusions. No significant bone abnormality. IMPRESSION: Patchy bilateral peripheral infiltrates in the mid zones and at both lung bases worrisome for viral pneumonia. No other significant abnormalities. Electronically Signed   By: Lorriane Shire M.D.   On: 09/12/2019 18:16

## 2019-09-16 NOTE — Progress Notes (Signed)
Inpatient Diabetes Program Recommendations  AACE/ADA: New Consensus Statement on Inpatient Glycemic Control   Target Ranges:  Prepandial:   less than 140 mg/dL      Peak postprandial:   less than 180 mg/dL (1-2 hours)      Critically ill patients:  140 - 180 mg/dL   Results for MIXTLI, TOWLES (MRN [id number]) as of 09/16/2019 12:58  Ref. Range 09/15/2019 07:01 09/15/2019 11:23 09/15/2019 16:40 09/15/2019 20:10 09/16/2019 07:39 09/16/2019 11:27  Glucose-Capillary Latest Ref Range: 70 - 99 mg/dL 218 (H) 213 (H) 293 (H) 223 (H) 262 (H) 293 (H)   Review of Glycemic Control  Diabetes history: DM2 Outpatient Diabetes medications: Xultophy 100-3.6 16 units QAM Current orders for Inpatient glycemic control: Lantus 6 units QHS, Novolog 0-9 units TID with meals; Solumedorl 20 mg daily  Inpatient Diabetes Program Recommendations:   Insulin-Basal: Please consider increasing Lantus to 10 units QHS.  Insulin-Meal Coverage: If steroids are continued, please consider ordering Novolog 3 units TID with meals for meal coverage if patient eats at least 50% of meals.  Insulin-Correction: Please consider ordering Novolog 0-5 units QHS for bedtime correction.  Thanks, Barnie Alderman, RN, MSN, CDE Diabetes Coordinator Inpatient Diabetes Program [phone] (Team Pager from 8am to 5pm)

## 2019-09-16 NOTE — Progress Notes (Signed)
MEDICATION RELATED CONSULT NOTE - INITIAL   Pharmacy Consult for Tikosyn/Mycophenolate management  Indication: Renal transplant in 2013   Allergies  Allergen Reactions  . Omeprazole Other (See Comments)    Interferes with the absorption of rilipivirine  . Eggs Or Egg-Derived Products Nausea And Vomiting  . Influenza Vaccines Other (See Comments)    Hallucination    Patient Measurements: Height: 5\' 3"  (160 cm) Weight: 152 lb 5.4 oz (69.1 kg) IBW/kg (Calculated) : 52.4  Vital Signs: Temp: 97.8 F (36.6 C) (10/13 1128) Temp Source: Oral (10/13 1128) BP: 155/75 (10/13 1128) Pulse Rate: 62 (10/13 1128) Intake/Output from previous day: 10/12 0701 - 10/13 0700 In: 1122.6 [I.V.:397.6; IV Piggyback:725] Out: 350 [Urine:350] Intake/Output from this shift: Total I/O In: 240 [P.O.:240] Out: -   Labs: Recent Labs    09/14/19 0420 09/15/19 0123 09/16/19 0405  WBC 3.3* 5.4 6.7  HGB 8.9* 8.6* 10.1*  HCT 28.1* 27.4* 31.4*  PLT 182 191 239  CREATININE 4.22* 4.14* 4.30*  MG 1.9 1.9 2.2  ALBUMIN 2.4* 2.3* 2.5*  PROT 6.1* 5.8* 6.3*  AST 38 32 31  ALT 19 16 17   ALKPHOS 36* 36* 39  BILITOT 0.4 0.3 0.3   Estimated Creatinine Clearance: 11.4 mL/min (A) (by C-G formula based on SCr of 4.3 mg/dL (H)).   Microbiology: Recent Results (from the past 720 hour(s))  Culture, blood (routine x 2)     Status: None (Preliminary result)   Collection Time: 09/12/19  6:46 PM   Specimen: BLOOD RIGHT HAND  Result Value Ref Range Status   Specimen Description BLOOD RIGHT HAND  Final   Special Requests   Final    BOTTLES DRAWN AEROBIC ONLY Blood Culture adequate volume   Culture   Final    NO GROWTH 3 DAYS Performed at [facility], [address] [place], [place] [address]    Report Status PENDING  Incomplete  SARS CORONAVIRUS 2 (TAT 6-24 HRS) Nasopharyngeal Nasopharyngeal Swab     Status: Abnormal   Collection Time: 09/12/19  6:48 PM   Specimen: Nasopharyngeal Swab  Result Value  Ref Range Status   SARS Coronavirus 2 POSITIVE (A) NEGATIVE Final    Comment: RESULT CALLED TO, READ BACK BY AND VERIFIED WITH: P. PEREZ,RN 0135 09/13/2019 T. TYSOR (NOTE) SARS-CoV-2 target nucleic acids are DETECTED. The SARS-CoV-2 RNA is generally detectable in upper and lower respiratory specimens during the acute phase of infection. Positive results are indicative of active infection with SARS-CoV-2. Clinical  correlation with patient history and other diagnostic information is necessary to determine patient infection status. Positive results do  not rule out bacterial infection or co-infection with other viruses. The expected result is Negative. Fact Sheet for Patients: SugarRoll.be Fact Sheet for Healthcare Providers: https://www.woods-mathews.com/ This test is not yet approved or cleared by the [place] FDA and  has been authorized for detection and/or diagnosis of SARS-CoV-2 by FDA under an Emergency Use Authorization (EUA). This EUA will remain  in effect (meaning this test can be used) for  the duration of the COVID-19 declaration under Section 564(b)(1) of the Act, 21 U.S.C. section 360bbb-3(b)(1), unless the authorization is terminated or revoked sooner. Performed at [facility], [place] [address] [place], [place] [address]   Culture, blood (routine x 2)     Status: None (Preliminary result)   Collection Time: 09/12/19  7:39 PM   Specimen: BLOOD  Result Value Ref Range Status   Specimen Description BLOOD RIGHT ANTECUBITAL  Final   Special Requests   Final    BOTTLES DRAWN AEROBIC AND ANAEROBIC Blood Culture adequate volume   Culture   Final    NO GROWTH 3 DAYS Performed at [facility], [address], [place], [place] [address]    Report Status PENDING  Incomplete    Medical History: Past Medical History:  Diagnosis Date  . CHF (congestive heart failure) ([place])   . Chronic kidney disease    ESRD  secondary to DM, started HD in 2008, Dr. Jimmy Footman is her nephrologist, received renal transplant in 2013, no longer on dialysis (06/17/2018)  . Dialysis patient [facility])    T Th Sat  . Early cataracts, bilateral   . GERD (gastroesophageal reflux disease)   . Hepatitis C    untreated. VL 3700000 in 2008; pt reports this has been treated" (06/17/2018)  . High cholesterol   . History of blood transfusion 2013   "when I got the kidney" (06/17/2018)  . History of hepatitis C 08/04/2015  . HIV infection ([place]) 1998   Dx in 1998 in [place], she presented with PCP pneumonia at that time. Has been tried on multiple regimens by her PCP in [place] before./ She has been on current ART for years now. Moved to [facility] in 2008 and is following with Dr. Tommy Medal since then.   . Hypertension   . Influenza-like illness 05/27/2019  . 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 PONV (postoperative nausea and vomiting)   . Type II diabetes mellitus ([place])   . Wears glasses     Medications:  Medications Prior to Admission  Medication Sig Dispense Refill Last Dose  . amLODipine (NORVASC) 10 MG tablet TAKE 1 TABLET(10 MG) BY MOUTH DAILY (Patient taking differently: Take 10 mg by mouth daily. ) 90 tablet 3 Past Week at Unknown time  . aspirin 81 MG EC tablet Take 81 mg by mouth daily.     Past Week at Unknown time  . calcitRIOL (ROCALTROL) 0.25 MCG capsule Take 0.25 mcg by mouth daily.  6 Past Week at Unknown time  . carvedilol (COREG) 25 MG tablet Take 1 tablet (25 mg total) by mouth 2 (two) times daily with a meal. 60 tablet 11 09/10/2019 at 0800  . cinacalcet (SENSIPAR) 60 MG tablet Take 60 mg by mouth daily.   Past Week at Unknown time  . dolutegravir (TIVICAY) 50 MG tablet Take 1 tablet (50 mg total) by mouth daily. 30 tablet 11 Past Week at Unknown time  . furosemide (LASIX) 80 MG tablet Take 80 mg by mouth daily.    Past Week at Unknown time  . Insulin Degludec-Liraglutide (XULTOPHY) 100-3.6 UNIT-MG/ML SOPN Inject 16  Units into the skin every morning. 12 mL 3 Past Week at Unknown time  . lamivudine (EPIVIR) 100 MG tablet Take 1 tablet (100 mg total) by mouth daily. 30 tablet 11 Past Week at Unknown time  . magnesium oxide (MAG-OX) 400 MG tablet Take 400 mg by mouth daily.   Past Week at Unknown time  . mycophenolate (MYFORTIC) 180 MG EC tablet Take 540 mg by mouth 2 (two) times daily.   Past Week at Unknown time  . pantoprazole (PROTONIX) 20 MG tablet Take 1 tablet (20 mg total) by mouth daily. 30 tablet 3 Past Week at Unknown time  . potassium chloride SA (K-DUR,KLOR-CON) 20 MEQ tablet Take 20 mEq by mouth daily.  10 Past Week at Unknown time  . predniSONE (DELTASONE) 5 MG tablet  Take 5 mg by mouth daily with breakfast.   Past Week at Unknown time  . rosuvastatin (CRESTOR) 20 MG tablet TAKE 1 TABLET(20 MG) BY MOUTH DAILY (Patient taking differently: Take 20 mg by mouth daily. ) 90 tablet 1 Past Week at Unknown time  . tacrolimus (PROGRAF) 1 MG capsule Take 2-3 mg by mouth See admin instructions. Take 2 mg by mouth at 0400 and take 3 mg by mouth at 1500.   Past Week at Unknown time  . VOLTAREN 1 % GEL APPLY 2 GRAMS EXTERNALLY TO THE AFFECTED AREA FOUR TIMES DAILY (Patient taking differently: Apply 2 g topically 4 (four) times daily. ) 100 g 2 Past Week at Unknown time  . fluticasone (FLONASE) 50 MCG/ACT nasal spray SHAKE LIQUID WELL AND USE 1 SPRAY IN EACH NOSTRIL DAILY (Patient not taking: No sig reported) 16 g 0 Not Taking at Unknown time  . FREESTYLE TEST STRIPS test strip Use to check blood sugar 4 times daily. diag code E11.9. insulin dependent 375 each 1   . Insulin Pen Needle 32G X 4 MM MISC 16 Units by Does not apply route daily. 100 each 2   . Lancets (FREESTYLE) lancets Use as instructed 100 each 12   . loratadine (CLARITIN) 10 MG tablet Take 1 tablet (10 mg total) by mouth daily. (Patient not taking: Reported on 07/19/2018) 30 tablet 2 Not Taking at Unknown time    Assessment: 79 YOF with h/o renal  transplant in 2013 on antirejection meds with tacrolimus, myfortic and prednisone. Her last tacrolimus level was 5.7 on 03/07/19. Per most recent record, her goal level is 5.   Her BL SCr is around 3.5 with acute rise in serum creatinine during this admission peaking at 5.22. SCr is 4.3 today. Of note, MD has ordered a tacrolimus level given AKI and this level remains pending.  Home tacrolimus dose: 2 mg in AM, 3 mg in PM Home Myfortic dose: 540 mg twice daily     Plan:  -Continuing current tacrolimus and myfortic doses until tacro level returns.  -Monitor SCr. Will readdress plan if renal fx worsens.   Albertina Parr, PharmD., BCPS Clinical Pharmacist

## 2019-09-17 ENCOUNTER — Inpatient Hospital Stay (HOSPITAL_COMMUNITY): Payer: Medicare Other

## 2019-09-17 LAB — TACROLIMUS LEVEL: Tacrolimus (FK506) - LabCorp: 6.9 ng/mL (ref 2.0–20.0)

## 2019-09-17 LAB — CBC WITH DIFFERENTIAL/PLATELET
Abs Immature Granulocytes: 0.09 10*3/uL — ABNORMAL HIGH (ref 0.00–0.07)
Basophils Absolute: 0 10*3/uL (ref 0.0–0.1)
Basophils Relative: 0 %
Eosinophils Absolute: 0 10*3/uL (ref 0.0–0.5)
Eosinophils Relative: 0 %
HCT: 29.7 % — ABNORMAL LOW (ref 36.0–46.0)
Hemoglobin: 9.7 g/dL — ABNORMAL LOW (ref 12.0–15.0)
Immature Granulocytes: 1 %
Lymphocytes Relative: 3 %
Lymphs Abs: 0.3 10*3/uL — ABNORMAL LOW (ref 0.7–4.0)
MCH: 28.5 pg (ref 26.0–34.0)
MCHC: 32.7 g/dL (ref 30.0–36.0)
MCV: 87.4 fL (ref 80.0–100.0)
Monocytes Absolute: 0.6 10*3/uL (ref 0.1–1.0)
Monocytes Relative: 6 %
Neutro Abs: 8.1 10*3/uL — ABNORMAL HIGH (ref 1.7–7.7)
Neutrophils Relative %: 90 %
Platelets: 266 10*3/uL (ref 150–400)
RBC: 3.4 MIL/uL — ABNORMAL LOW (ref 3.87–5.11)
RDW: 14.7 % (ref 11.5–15.5)
WBC: 9.1 10*3/uL (ref 4.0–10.5)
nRBC: 0 % (ref 0.0–0.2)

## 2019-09-17 LAB — COMPREHENSIVE METABOLIC PANEL
ALT: 18 U/L (ref 0–44)
AST: 29 U/L (ref 15–41)
Albumin: 2.6 g/dL — ABNORMAL LOW (ref 3.5–5.0)
Alkaline Phosphatase: 43 U/L (ref 38–126)
Anion gap: 16 — ABNORMAL HIGH (ref 5–15)
BUN: 94 mg/dL — ABNORMAL HIGH (ref 8–23)
CO2: 14 mmol/L — ABNORMAL LOW (ref 22–32)
Calcium: 9.1 mg/dL (ref 8.9–10.3)
Chloride: 113 mmol/L — ABNORMAL HIGH (ref 98–111)
Creatinine, Ser: 4.7 mg/dL — ABNORMAL HIGH (ref 0.44–1.00)
GFR calc Af Amer: 10 mL/min — ABNORMAL LOW (ref >60)
GFR calc non Af Amer: 9 mL/min — ABNORMAL LOW (ref >60)
Glucose, Bld: 224 mg/dL — ABNORMAL HIGH (ref 70–99)
Potassium: 4.4 mmol/L (ref 3.5–5.1)
Sodium: 143 mmol/L (ref 135–145)
Total Bilirubin: 0.3 mg/dL (ref 0.3–1.2)
Total Protein: 6 g/dL — ABNORMAL LOW (ref 6.5–8.1)

## 2019-09-17 LAB — GLUCOSE, CAPILLARY
Glucose-Capillary: 247 mg/dL — ABNORMAL HIGH (ref 70–99)
Glucose-Capillary: 173 mg/dL — ABNORMAL HIGH (ref 70–99)
Glucose-Capillary: 234 mg/dL — ABNORMAL HIGH (ref 70–99)
Glucose-Capillary: 205 mg/dL — ABNORMAL HIGH (ref 70–99)

## 2019-09-17 LAB — C-REACTIVE PROTEIN: CRP: 3.8 mg/dL — ABNORMAL HIGH (ref <1.0)

## 2019-09-17 LAB — D-DIMER, QUANTITATIVE: D-Dimer, Quant: 2.51 ug/mL-FEU — ABNORMAL HIGH (ref 0.00–0.50)

## 2019-09-17 LAB — CREATININE, URINE, RANDOM: Creatinine, Urine: 103.1 mg/dL

## 2019-09-17 LAB — SODIUM, URINE, RANDOM: Sodium, Ur: 27 mmol/L

## 2019-09-17 LAB — OSMOLALITY: Osmolality: 335 mOsm/kg (ref 275–295)

## 2019-09-17 LAB — MAGNESIUM: Magnesium: 2.4 mg/dL (ref 1.7–2.4)

## 2019-09-17 MED ORDER — INSULIN GLARGINE 100 UNIT/ML ~~LOC~~ SOLN
10.0000 [IU] | Freq: Every day | SUBCUTANEOUS | Status: DC
  Administered 2019-09-18: 10 [IU] via SUBCUTANEOUS
  Filled 2019-09-17 (×3): qty 0.1

## 2019-09-17 MED ORDER — LOPERAMIDE HCL 2 MG PO CAPS
2.0000 mg | ORAL_CAPSULE | Freq: Four times a day (QID) | ORAL | Status: DC | PRN
  Administered 2019-09-18 (×2): 2 mg via ORAL
  Filled 2019-09-17 (×2): qty 1

## 2019-09-17 MED ORDER — LACTATED RINGERS IV SOLN
INTRAVENOUS | Status: AC
  Administered 2019-09-17: 12:00:00 via INTRAVENOUS

## 2019-09-17 NOTE — Progress Notes (Addendum)
PROGRESS NOTE                                                                                                                                                                                                             Patient Demographics:    [name], is a 73 y.o. female, DOB - [birth year], XBL:[id number]  Outpatient Primary MD for the patient is Earlene Plater, MD    LOS - 4  Admit date - 09/12/2019    Chief Complaint  Patient presents with  . Neck Pain    fever  . Abdominal Pain       Brief Narrative  - 73 year old with history of renal transplantation on immunosuppressive's with chronic kidney disease stage IV, HIV on antiretrovirals, chronic hepatitis C, DM-2 on insulin-presenting with 6-7 history of cough-followed by exertional dyspnea and severe diarrhea.  Found to have acute hypoxic respiratory failure secondary COVID-19 pneumonia along with AKI on CKD stage III with metabolic acidosis.  Subsequently transferred to [facility].   Subjective:   Patient in bed, appears comfortable, denies any headache, no fever, no chest pain or pressure, no shortness of breath , no abdominal pain. No focal weakness.   Assessment  & Plan :     1. Acute Covid 19 Viral gastroenteritis and pneumonitis during the ongoing 2020 Covid 19 Pandemic - she did have mild acute hypoxic respiratory failure.  Due to her underlying immunocompromised state and elevated procalcitonin she has been adequately started on IV Rocephin, azithromycin combination along with IV steroids and Remdisvir.  She is clinically improving.  Will trend inflammatory markers and procalcitonin.  Encouraged to sit up in chair in the daytime use flutter valve and I-S for pulmonary toiletry and prone in bed at night.   Note she is a poor candidate for Actemra due to elevated procalcitonin and history of chronic hepatitis C.   COVID-19 Labs  Recent Labs    09/15/19 0123 09/16/19 0405 09/17/19 0225 09/17/19 0255  DDIMER 1.52* 2.50* 2.51*  --   FERRITIN 775*  --   --   --   CRP 7.4* 5.5*  --  3.8*    Lab Results  Component Value Date   SARSCOV2NAA POSITIVE (A) 09/12/2019   [place] Not Detected 05/09/2019     Hepatic Function Latest Ref Rng & Units 09/17/2019 09/16/2019 09/15/2019  Total Protein 6.5 - 8.1 g/dL 6.0(L) 6.3(L) 5.8(L)  Albumin 3.5 - 5.0 g/dL 2.6(L) 2.5(L) 2.3(L)  AST 15 - 41 U/L 29 31 32  ALT 0 - 44 U/L 18 17 16  Alk Phosphatase 38 - 126 U/L 43 39 36(L)  Total Bilirubin 0.3 - 1.2 mg/dL 0.3 0.3 0.3  Bilirubin, Direct 0.0 - 0.3 mg/dL - - -        Component Value Date/Time   BNP 242.9 (H) 06/17/2018 1955      2.  AKI in a patient with status post renal transplant on immunosuppressive therapy with a baseline kidney function of CKD 4, ? Mild Prograf toxicity along with ATN due to hypotension and dehydration.  Adequately hydrated, renal function had come down somewhat however it is trending up again on 09/17/2019.  Have ordered renal ultrasound along with urine electrolytes, resume hydration which was stopped yesterday after 3 days of IV fluids.  Continue to hold Lasix for now and monitor.  Prograf levels which were initially high upon admission have now trended down.  This was initially discussed by previous physician with nephrologist Dr. Goldsborough, I discussed her case with Dr. Webb and Colad[place]ato on 09/17/2019.  Currently continued on IV steroids along with home immunomodulators, pharmacy monitoring the doses.    Ref Range & Units 2d ago 4d ago  Tacrolimus (FK506) - [facility] 2.0 - 20.0 ng/mL 6.9  30.8High  CM      3.  History of HIV and chronic hepatitis C.  CD4 count on 05/07/2019 was 214.  Pharmacy dosing ART.  Lamivudine at 50 mg due to AKI.  4.  Essential hypertension currently on combination of Norvasc, Coreg continue and monitor.  5.  Deconditioning.  PT OT eval, may require SNF placement.  6.  GERD.   On PPI.  7.  Dyslipidemia.  Continue statin.  8.  DM type II.  Currently on Lantus and sliding scale will monitor and adjust.  Lantus dose increased on 09/17/2019 for better control.  Lab Results  Component Value Date   HGBA1C 6.9 (H) 06/17/2018   CBG (last 3)  Recent Labs    09/16/19 1710 09/16/19 2018 09/16/19 2041  GLUCAP 244* 209* 223*     Condition - Fair  Family Communication  : Daughter on 09/14/19, 09/16/19, 09/17/19  Code Status : Full  Diet :   Diet Order            Diet renal/carb modified with fluid restriction Diet-HS Snack? Nothing; Fluid restriction: 1200 mL Fluid; Room service appropriate? Yes; Fluid consistency: Thin  Diet effective now               Disposition Plan  :  Inpt  Consults  :  None  Procedures  :    PUD Prophylaxis : PPI  DVT Prophylaxis  :   Heparin    Lab Results  Component Value Date   PLT 266 09/17/2019    Inpatient Medications  Scheduled Meds: . amLODipine  10 mg Oral Daily  . aspirin  81 mg Oral Daily  . calcitRIOL  0.25 mcg Oral Daily  . carvedilol  25 mg Oral BID WC  . cinacalcet  60 mg Oral Daily  . dolutegravir  50 mg Oral Daily  . heparin  5,000 Units Subcutaneous Q8H  . insulin aspart  0-9 Units Subcutaneous TID WC  . insulin glargine  6 Units Subcutaneous QHS  . lamiVUDine  50 mg Oral Daily  . magnesium oxide  400   mg Oral Daily  . methylPREDNISolone (SOLU-MEDROL) injection  20 mg Intravenous Daily  . mycophenolate  540 mg Oral BID  . pantoprazole  40 mg Oral Daily  . potassium chloride SA  20 mEq Oral Daily  . rosuvastatin  20 mg Oral q1800  . sodium bicarbonate  650 mg Oral BID  . tacrolimus  2 mg Oral Q24H   And  . tacrolimus  3 mg Oral Q24H   Continuous Infusions: . lactated ringers    . remdesivir 100 mg in NS 250 mL Stopped (09/17/19 0005)   PRN Meds:.acetaminophen **OR** [DISCONTINUED] acetaminophen, benzonatate, [DISCONTINUED] ondansetron **OR** ondansetron (ZOFRAN) IV  Antibiotics  :     Anti-infectives (From admission, onward)   Start     Dose/Rate Route Frequency Ordered Stop   09/14/19 1600  remdesivir 100 mg in sodium chloride 0.9 % 250 mL IVPB     100 mg 500 mL/hr over 30 Minutes Intravenous Every 24 hours 09/13/19 1222 09/18/19 1559   09/13/19 1800  cefTRIAXone (ROCEPHIN) 1 g in sodium chloride 0.9 % 100 mL IVPB  Status:  Discontinued     1 g 200 mL/hr over 30 Minutes Intravenous Every 24 hours 09/13/19 1631 09/17/19 0751   09/13/19 1730  azithromycin (ZITHROMAX) 500 mg in sodium chloride 0.9 % 250 mL IVPB  Status:  Discontinued     500 mg 250 mL/hr over 60 Minutes Intravenous Every 24 hours 09/13/19 1631 09/17/19 0745   09/13/19 1700  lamiVUDine (EPIVIR) 10 MG/ML solution 50 mg     50 mg Oral Daily 09/13/19 1621     09/13/19 1615  lamivudine (EPIVIR) tablet 50 mg  Status:  Discontinued     50 mg Oral Daily 09/13/19 1612 09/13/19 1621   09/13/19 1600  dolutegravir (TIVICAY) tablet 50 mg     50 mg Oral Daily 09/13/19 1536     09/13/19 1400  remdesivir 200 mg in sodium chloride 0.9 % 250 mL IVPB     200 mg 500 mL/hr over 30 Minutes Intravenous Once 09/13/19 1222 09/13/19 1558   09/13/19 1000  dolutegravir (TIVICAY) tablet 50 mg  Status:  Discontinued     50 mg Oral Daily 09/13/19 0535 09/13/19 1536       Time Spent in minutes  30   Prashant Singh M.D on 09/17/2019 at 10:21 AM  To page go to [email or web address] - password [email or web address]  [facility] -  Office  [phone]     See all Orders from today for further details    Objective:   Vitals:   09/16/19 2022 09/17/19 0000 09/17/19 0438 09/17/19 0741  BP: (!) 146/62 (!) 141/67 (!) 161/76 (!) 159/82  Pulse: (!) 58 (!) 59 61 77  Resp: 17 19 20 17  Temp: 97.7 F (36.5 C) 98.1 F (36.7 C) 98.2 F (36.8 C) 98 F (36.7 C)  TempSrc:    Oral  SpO2: 96% 93% 94% 90%  Weight:      Height:        Wt Readings from Last 3 Encounters:  09/15/19 69.1 kg  05/27/19 65.8 kg  11/05/18 64.4 kg      Intake/Output Summary (Last 24 hours) at 09/17/2019 1021 Last data filed at 09/17/2019 0742 Gross per 24 hour  Intake 1084.83 ml  Output 250 ml  Net 834.83 ml     Physical Exam  Awake Alert, Oriented X 3, No new F.N deficits, Normal affect [place].AT,PERRAL Supple Neck,No JVD, No cervical lymphadenopathy appriciated.  Symmetrical Chest wall   movement, Good air movement bilaterally, CTAB RRR,No Gallops, Rubs or new Murmurs, No Parasternal Heave +ve B.Sounds, Abd Soft, No tenderness, No organomegaly appriciated, No rebound - guarding or rigidity. No Cyanosis, Clubbing or edema, No new Rash or bruise    Data Review:    CBC   Recent Labs  Lab 09/13/19 0652 09/14/19 0420 09/15/19 0123 09/16/19 0405 09/17/19 0225  WBC 4.1 3.3* 5.4 6.7 9.1  HGB 9.5* 8.9* 8.6* 10.1* 9.7*  HCT 28.6* 28.1* 27.4* 31.4* 29.7*  PLT 146* 182 191 239 266  MCV 90.2 89.8 89.3 90.2 87.4  MCH 30.0 28.4 28.0 29.0 28.5  MCHC 33.2 31.7 31.4 32.2 32.7  RDW 14.0 14.3 14.5 14.8 14.7  LYMPHSABS 0.5* 0.3* 0.3* 0.2* 0.3*  MONOABS 0.5 0.1 0.2 0.3 0.6  EOSABS 0.0 0.0 0.0 0.0 0.0  BASOSABS 0.0 0.0 0.0 0.0 0.0    Chemistries   Recent Labs  Lab 09/13/19 0652 09/14/19 0420 09/15/19 0123 09/16/19 0405 09/17/19 0225  NA 136 138 139 140 143  K 3.5 4.1 4.0 4.4 4.4  CL 105 110 111 111 113*  CO2 17* 12* 13* 12* 14*  GLUCOSE 98 178* 223* 238* 224*  BUN 64* 68* 72* 80* 94*  CREATININE 5.01* 4.22* 4.14* 4.30* 4.70*  CALCIUM 8.9 8.3* 8.1* 8.7* 9.1  MG  --  1.9 1.9 2.2 2.4  AST 31 38 32 31 29  ALT _0 ALKPHOS 35* 36* 36* 39 43  BILITOT 0.7 0.4 0.3 0.3 0.3   ------------------------------------------------------------------------------------------------------------------ No results for input(s): CHOL, HDL, LDLCALC, TRIG, CHOLHDL, LDLDIRECT in the last 72 hours.  Lab Results  Component Value Date   HGBA1C 6.9 (H) 06/17/2018    ------------------------------------------------------------------------------------------------------------------ No results for input(s): TSH, T4TOTAL, T3FREE, THYROIDAB in the last 72 hours.  Invalid input(s): FREET3  Cardiac Enzymes No results for input(s): CKMB, TROPONINI, MYOGLOBIN in the last 168 hours.  Invalid input(s): CK ------------------------------------------------------------------------------------------------------------------    Component Value Date/Time   BNP 242.9 (H) 06/17/2018 1955    Micro Results Recent Results (from the past 240 hour(s))  Culture, blood (routine x 2)     Status: None (Preliminary result)   Collection Time: 09/12/19  6:46 PM   Specimen: BLOOD RIGHT HAND  Result Value Ref Range Status   Specimen Description BLOOD RIGHT HAND  Final   Special Requests   Final    BOTTLES DRAWN AEROBIC ONLY Blood Culture adequate volume   Culture   Final    NO GROWTH 4 DAYS Performed at [facility], [address], [place], [place] [address]    Report Status PENDING  Incomplete  SARS CORONAVIRUS 2 (TAT 6-24 HRS) Nasopharyngeal Nasopharyngeal Swab     Status: Abnormal   Collection Time: 09/12/19  6:48 PM   Specimen: Nasopharyngeal Swab  Result Value Ref Range Status   SARS Coronavirus 2 POSITIVE (A) NEGATIVE Final    Comment: RESULT CALLED TO, READ BACK BY AND VERIFIED WITH: P. PEREZ,RN 0135 09/13/2019 T. TYSOR (NOTE) SARS-CoV-2 target nucleic acids are DETECTED. The SARS-CoV-2 RNA is generally detectable in upper and lower respiratory specimens during the acute phase of infection. Positive results are indicative of active infection with SARS-CoV-2. Clinical  correlation with patient history and other diagnostic information is necessary to determine patient infection status. Positive results do  not rule out bacterial infection or co-infection with other viruses. The expected result is Negative. Fact Sheet for Patients:  SugarRoll.be Fact Sheet for Healthcare Providers: https://www.woods-mathews.com/ This  test is not yet approved or cleared by the [place] and  has been authorized for detection and/or diagnosis of SARS-CoV-2 by FDA under an Emergency Use Authorization (EUA). This EUA will remain  in effect (meaning this test can be used) for  the duration of the COVID-19 declaration under Section 564(b)(1) of the Act, 21 U.S.C. section 360bbb-3(b)(1), unless the authorization is terminated or revoked sooner. Performed at [facility], [place] [address] [place], [place] [address]   Culture, blood (routine x 2)     Status: None (Preliminary result)   Collection Time: 09/12/19  7:39 PM   Specimen: BLOOD  Result Value Ref Range Status   Specimen Description BLOOD RIGHT ANTECUBITAL  Final   Special Requests   Final    BOTTLES DRAWN AEROBIC AND ANAEROBIC Blood Culture adequate volume   Culture   Final    NO GROWTH 4 DAYS Performed at [facility], [place] [address] [place], [place] [address]    Report Status PENDING  Incomplete    Radiology Reports Dg Chest Portable 1 View  Result Date: 09/12/2019 CLINICAL DATA:  Cough and fever. EXAM: PORTABLE CHEST 1 VIEW COMPARISON:  06/17/2018 FINDINGS: There are patchy faint bilateral peripheral infiltrates in both mid zones and at both lung bases worrisome for viral pneumonia. Heart size and pulmonary vascularity are normal. No effusions. No significant bone abnormality. IMPRESSION: Patchy bilateral peripheral infiltrates in the mid zones and at both lung bases worrisome for viral pneumonia. No other significant abnormalities. Electronically Signed   By: Lorriane Shire M.D.   On: 09/12/2019 18:16

## 2019-09-17 NOTE — Progress Notes (Signed)
MEDICATION RELATED CONSULT NOTE - INITIAL   Pharmacy Consult for Tikosyn/Mycophenolate management  Indication: Renal transplant in 2013   Allergies  Allergen Reactions  . Omeprazole Other (See Comments)    Interferes with the absorption of rilipivirine  . Eggs Or Egg-Derived Products Nausea And Vomiting  . Influenza Vaccines Other (See Comments)    Hallucination    Patient Measurements: Height: 5\' 3"  (160 cm) Weight: 152 lb 5.4 oz (69.1 kg) IBW/kg (Calculated) : 52.4  Vital Signs: Temp: 98 F (36.7 C) (10/14 0741) Temp Source: Oral (10/14 0741) BP: 159/82 (10/14 0741) Pulse Rate: 77 (10/14 0741) Intake/Output from previous day: 10/13 0701 - 10/14 0700 In: 1324.8 [P.O.:480; IV Piggyback:844.8] Out: -  Intake/Output from this shift: Total I/O In: -  Out: 250 [Urine:250]  Labs: Recent Labs    09/15/19 0123 09/16/19 0405 09/17/19 0225 09/17/19 1055  WBC 5.4 6.7 9.1  --   HGB 8.6* 10.1* 9.7*  --   HCT 27.4* 31.4* 29.7*  --   PLT 191 239 266  --   CREATININE 4.14* 4.30* 4.70*  --   LABCREA  --   --   --  103.10  MG 1.9 2.2 2.4  --   ALBUMIN 2.3* 2.5* 2.6*  --   PROT 5.8* 6.3* 6.0*  --   AST 32 31 29  --   ALT 16 17 18   --   ALKPHOS 36* 39 43  --   BILITOT 0.3 0.3 0.3  --    Estimated Creatinine Clearance: 10.4 mL/min (A) (by C-G formula based on SCr of 4.7 mg/dL (H)).   Microbiology: Recent Results (from the past 720 hour(s))  Culture, blood (routine x 2)     Status: None (Preliminary result)   Collection Time: 09/12/19  6:46 PM   Specimen: BLOOD RIGHT HAND  Result Value Ref Range Status   Specimen Description BLOOD RIGHT HAND  Final   Special Requests   Final    BOTTLES DRAWN AEROBIC ONLY Blood Culture adequate volume   Culture   Final    NO GROWTH 4 DAYS Performed at [facility], [address], [place], [place] [address]    Report Status PENDING  Incomplete  SARS CORONAVIRUS 2 (TAT 6-24 HRS) Nasopharyngeal Nasopharyngeal Swab     Status:  Abnormal   Collection Time: 09/12/19  6:48 PM   Specimen: Nasopharyngeal Swab  Result Value Ref Range Status   SARS Coronavirus 2 POSITIVE (A) NEGATIVE Final    Comment: RESULT CALLED TO, READ BACK BY AND VERIFIED WITH: P. PEREZ,RN 0135 09/13/2019 T. TYSOR (NOTE) SARS-CoV-2 target nucleic acids are DETECTED. The SARS-CoV-2 RNA is generally detectable in upper and lower respiratory specimens during the acute phase of infection. Positive results are indicative of active infection with SARS-CoV-2. Clinical  correlation with patient history and other diagnostic information is necessary to determine patient infection status. Positive results do  not rule out bacterial infection or co-infection with other viruses. The expected result is Negative. Fact Sheet for Patients: SugarRoll.be Fact Sheet for Healthcare Providers: https://www.woods-mathews.com/ This test is not yet approved or cleared by the [place] FDA and  has been authorized for detection and/or diagnosis of SARS-CoV-2 by FDA under an Emergency Use Authorization (EUA). This EUA will remain  in effect (meaning this test can be used) for  the duration of the COVID-19 declaration under Section 564(b)(1) of the Act, 21 U.S.C. section 360bbb-3(b)(1), unless the authorization is terminated or revoked sooner. Performed at [facility], [place] [address] [place], [place] [address]   Culture, blood (routine x 2)     Status: None (Preliminary result)   Collection Time: 09/12/19  7:39 PM   Specimen: BLOOD  Result Value Ref Range Status   Specimen Description BLOOD RIGHT ANTECUBITAL  Final   Special Requests   Final    BOTTLES DRAWN AEROBIC AND ANAEROBIC Blood Culture adequate volume   Culture   Final    NO GROWTH 4 DAYS Performed at [facility], [place] [address], [place], [place] [address]    Report Status PENDING  Incomplete    Medical History: Past Medical History:   Diagnosis Date  . CHF (congestive heart failure) ([place])   . Chronic kidney disease    ESRD secondary to DM, started HD in 2008, Dr. Jimmy Footman is her nephrologist, received renal transplant in 2013, no longer on dialysis (06/17/2018)  . Dialysis patient [facility])    T Th Sat  . Early cataracts, bilateral   . GERD (gastroesophageal reflux disease)   . Hepatitis C    untreated. VL 3700000 in 2008; pt reports this has been treated" (06/17/2018)  . High cholesterol   . History of blood transfusion 2013   "when I got the kidney" (06/17/2018)  . History of hepatitis C 08/04/2015  . HIV infection ([place]) 1998   Dx in 1998 in [place], she presented with PCP pneumonia at that time. Has been tried on multiple regimens by her PCP in [place] before./ She has been on current ART for years now. Moved to [facility] in 2008 and is following with Dr. Tommy Medal since then.   . Hypertension   . Influenza-like illness 05/27/2019  . 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 PONV (postoperative nausea and vomiting)   . Type II diabetes mellitus ([place])   . Wears glasses     Medications:  Medications Prior to Admission  Medication Sig Dispense Refill Last Dose  . amLODipine (NORVASC) 10 MG tablet TAKE 1 TABLET(10 MG) BY MOUTH DAILY (Patient taking differently: Take 10 mg by mouth daily. ) 90 tablet 3 Past Week at Unknown time  . aspirin 81 MG EC tablet Take 81 mg by mouth daily.     Past Week at Unknown time  . calcitRIOL (ROCALTROL) 0.25 MCG capsule Take 0.25 mcg by mouth daily.  6 Past Week at Unknown time  . carvedilol (COREG) 25 MG tablet Take 1 tablet (25 mg total) by mouth 2 (two) times daily with a meal. 60 tablet 11 09/10/2019 at 0800  . cinacalcet (SENSIPAR) 60 MG tablet Take 60 mg by mouth daily.   Past Week at Unknown time  . dolutegravir (TIVICAY) 50 MG tablet Take 1 tablet (50 mg total) by mouth daily. 30 tablet 11 Past Week at Unknown time  . furosemide (LASIX) 80 MG tablet Take 80 mg by mouth daily.    Past Week at  Unknown time  . Insulin Degludec-Liraglutide (XULTOPHY) 100-3.6 UNIT-MG/ML SOPN Inject 16 Units into the skin every morning. 12 mL 3 Past Week at Unknown time  . lamivudine (EPIVIR) 100 MG tablet Take 1 tablet (100 mg total) by mouth daily. 30 tablet 11 Past Week at Unknown time  . magnesium oxide (MAG-OX) 400 MG tablet Take 400 mg by mouth daily.   Past Week at Unknown time  . mycophenolate (MYFORTIC) 180 MG EC tablet Take 540 mg by mouth 2 (two) times daily.   Past Week at Unknown time  . pantoprazole (  PROTONIX) 20 MG tablet Take 1 tablet (20 mg total) by mouth daily. 30 tablet 3 Past Week at Unknown time  . potassium chloride SA (K-DUR,KLOR-CON) 20 MEQ tablet Take 20 mEq by mouth daily.  10 Past Week at Unknown time  . predniSONE (DELTASONE) 5 MG tablet Take 5 mg by mouth daily with breakfast.   Past Week at Unknown time  . rosuvastatin (CRESTOR) 20 MG tablet TAKE 1 TABLET(20 MG) BY MOUTH DAILY (Patient taking differently: Take 20 mg by mouth daily. ) 90 tablet 1 Past Week at Unknown time  . tacrolimus (PROGRAF) 1 MG capsule Take 2-3 mg by mouth See admin instructions. Take 2 mg by mouth at 0400 and take 3 mg by mouth at 1500.   Past Week at Unknown time  . VOLTAREN 1 % GEL APPLY 2 GRAMS EXTERNALLY TO THE AFFECTED AREA FOUR TIMES DAILY (Patient taking differently: Apply 2 g topically 4 (four) times daily. ) 100 g 2 Past Week at Unknown time  . fluticasone (FLONASE) 50 MCG/ACT nasal spray SHAKE LIQUID WELL AND USE 1 SPRAY IN EACH NOSTRIL DAILY (Patient not taking: No sig reported) 16 g 0 Not Taking at Unknown time  . FREESTYLE TEST STRIPS test strip Use to check blood sugar 4 times daily. diag code E11.9. insulin dependent 375 each 1   . Insulin Pen Needle 32G X 4 MM MISC 16 Units by Does not apply route daily. 100 each 2   . Lancets (FREESTYLE) lancets Use as instructed 100 each 12   . loratadine (CLARITIN) 10 MG tablet Take 1 tablet (10 mg total) by mouth daily. (Patient not taking: Reported on  07/19/2018) 30 tablet 2 Not Taking at Unknown time    Assessment: 58 YOF with h/o renal transplant in 2013 on antirejection meds with tacrolimus, myfortic and prednisone. Her last tacrolimus level was 30.8 on 10/10 but this was a peak level as it was drawn soon after a dose.  A repeat level was drawn around 11 AM on 10/12 and has resulted at 6.9. This is not a true trough as it was drawn 6 hours after morning dose. I anticipate true trough to be closer to her goal level of 5.   SCr today continues to trend up slowly   Home tacrolimus dose: 2 mg in AM, 3 mg in PM Home Myfortic dose: 540 mg twice daily     Plan:  -Continuing current tacrolimus and myfortic doses -Monitor SCr. Will readdress plan if renal fx worsens.    Albertina Parr, PharmD., BCPS Clinical Pharmacist

## 2019-09-17 NOTE — Progress Notes (Signed)
Patient's daughter, Lavella Lemons, updated by telephone.  Patient condition and plan of care reviewed.  All questions welcomed and answered.

## 2019-09-17 NOTE — Progress Notes (Signed)
Physical Therapy Treatment Patient Details Name: [name] MRN: [id number] DOB: [birth year] Today's Date: 09/17/2019    History of Present Illness 73 yo female presenting with coughing, poor appetite, and diarrhea. Tested COVID +. PMH including HIV, DM type 2, renal transplant (June 2020), and HTN.    PT Comments     Pt did well with tx, was slightly emotional towards end of session but was able to complete all tasks given. Pt now on room air and sats remain in high 90s throughout. Noted HR in 100s with activity. Sats measured via finger probe.     Follow Up Recommendations  SNF;Supervision - Intermittent     Equipment Recommendations  Rolling walker with 5" wheels;Other (comment)    Recommendations for Other Services       Precautions / Restrictions Precautions Precautions: Fall;Other (comment) Precaution Comments: watch sats with activity Restrictions Weight Bearing Restrictions: No    Mobility  Bed Mobility               General bed mobility comments: Pt recieved sitting in recliner, states she is very cold  Transfers Overall transfer level: Needs assistance Equipment used: Rolling walker (2 wheeled) Transfers: Sit to/from Stand Sit to Stand: Supervision            Ambulation/Gait Ambulation/Gait assistance: Min assist Gait Distance (Feet): 44 Feet Assistive device: Rolling walker (2 wheeled) Gait Pattern/deviations: Step-through pattern     General Gait Details: noticed LLD and pt states that LLE is shorted than RLE. gait waddling sec to LLD, very slow, no LObs noted   Stairs             Wheelchair Mobility    Modified Rankin (Stroke Patients Only)       Balance Overall balance assessment: Mild deficits observed, not formally tested                                          Cognition Arousal/Alertness: Awake/alert Behavior During Therapy: WFL for tasks assessed/performed Overall Cognitive Status: No  family/caregiver present to determine baseline cognitive functioning Area of Impairment: Attention;Following commands;Problem solving                       Following Commands: Follows one step commands consistently     Problem Solving: Slow processing;Requires verbal cues General Comments: able to give hx, which seems correct  but not sure of accuracy. takes increased time to complete answering very simple questions      Exercises      General Comments General comments (skin integrity, edema, etc.): Pt was found to be on room air, during tx remained on room air and 02 sats remained in high 90s. Did not elevation of HR with activity into 100s (bpm) but pt did not appear to be in any distress      Pertinent Vitals/Pain Pain Assessment: No/denies pain    Home Living                      Prior Function            PT Goals (current goals can now be found in the care plan section) Acute Rehab PT Goals Patient Stated Goal: return home at [facility] Time For Goal Achievement: 09/29/19 Potential to Achieve Goals: Good    Frequency    Min 3X/week  PT Plan Current plan remains appropriate    Co-evaluation              AM-PAC PT "6 Clicks" Mobility   Outcome Measure    Help needed moving from lying on your back to sitting on the side of a flat bed without using bedrails?: A Little Help needed moving to and from a bed to a chair (including a wheelchair)?: A Little Help needed standing up from a chair using your arms (e.g., wheelchair or bedside chair)?: A Little Help needed to walk in hospital room?: A Little Help needed climbing 3-5 steps with a railing? : A Little 6 Click Score: 15    End of Session   Activity Tolerance: Treatment limited secondary to medical complications (Comment) Patient left: in chair;with call bell/phone within reach Nurse Communication: Mobility status PT Visit Diagnosis: Muscle weakness (generalized) (M62.81);Difficulty  in walking, not elsewhere classified (R26.2)     Time: [id number] PT Time Calculation (min) (ACUTE ONLY): 27 min  Charges:  $Gait Training: 8-22 mins $Therapeutic Activity: 8-22 mins                     Horald Chestnut, PT      Delford Field 09/17/2019, 1:54 PM

## 2019-09-18 LAB — CULTURE, BLOOD (ROUTINE X 2)
Culture: NO GROWTH
Culture: NO GROWTH
Special Requests: ADEQUATE
Special Requests: ADEQUATE

## 2019-09-18 LAB — GLUCOSE, CAPILLARY
Glucose-Capillary: 195 mg/dL — ABNORMAL HIGH (ref 70–99)
Glucose-Capillary: 180 mg/dL — ABNORMAL HIGH (ref 70–99)
Glucose-Capillary: 255 mg/dL — ABNORMAL HIGH (ref 70–99)
Glucose-Capillary: 265 mg/dL — ABNORMAL HIGH (ref 70–99)

## 2019-09-18 LAB — COMPREHENSIVE METABOLIC PANEL
ALT: 17 U/L (ref 0–44)
AST: 25 U/L (ref 15–41)
Albumin: 2.6 g/dL — ABNORMAL LOW (ref 3.5–5.0)
Alkaline Phosphatase: 44 U/L (ref 38–126)
Anion gap: 14 (ref 5–15)
BUN: 93 mg/dL — ABNORMAL HIGH (ref 8–23)
CO2: 14 mmol/L — ABNORMAL LOW (ref 22–32)
Calcium: 9.1 mg/dL (ref 8.9–10.3)
Chloride: 113 mmol/L — ABNORMAL HIGH (ref 98–111)
Creatinine, Ser: 4.54 mg/dL — ABNORMAL HIGH (ref 0.44–1.00)
GFR calc Af Amer: 11 mL/min — ABNORMAL LOW (ref >60)
GFR calc non Af Amer: 9 mL/min — ABNORMAL LOW (ref >60)
Glucose, Bld: 191 mg/dL — ABNORMAL HIGH (ref 70–99)
Potassium: 4.1 mmol/L (ref 3.5–5.1)
Sodium: 141 mmol/L (ref 135–145)
Total Bilirubin: 0.3 mg/dL (ref 0.3–1.2)
Total Protein: 5.9 g/dL — ABNORMAL LOW (ref 6.5–8.1)

## 2019-09-18 LAB — CBC WITH DIFFERENTIAL/PLATELET
Abs Immature Granulocytes: 0.15 10*3/uL — ABNORMAL HIGH (ref 0.00–0.07)
Basophils Absolute: 0 10*3/uL (ref 0.0–0.1)
Basophils Relative: 0 %
Eosinophils Absolute: 0 10*3/uL (ref 0.0–0.5)
Eosinophils Relative: 0 %
HCT: 28.6 % — ABNORMAL LOW (ref 36.0–46.0)
Hemoglobin: 9.2 g/dL — ABNORMAL LOW (ref 12.0–15.0)
Immature Granulocytes: 2 %
Lymphocytes Relative: 3 %
Lymphs Abs: 0.3 10*3/uL — ABNORMAL LOW (ref 0.7–4.0)
MCH: 28.1 pg (ref 26.0–34.0)
MCHC: 32.2 g/dL (ref 30.0–36.0)
MCV: 87.5 fL (ref 80.0–100.0)
Monocytes Absolute: 0.8 10*3/uL (ref 0.1–1.0)
Monocytes Relative: 8 %
Neutro Abs: 8.6 10*3/uL — ABNORMAL HIGH (ref 1.7–7.7)
Neutrophils Relative %: 87 %
Platelets: 268 10*3/uL (ref 150–400)
RBC: 3.27 MIL/uL — ABNORMAL LOW (ref 3.87–5.11)
RDW: 14.9 % (ref 11.5–15.5)
WBC: 9.9 10*3/uL (ref 4.0–10.5)
nRBC: 0 % (ref 0.0–0.2)

## 2019-09-18 LAB — C-REACTIVE PROTEIN: CRP: 3.2 mg/dL — ABNORMAL HIGH (ref <1.0)

## 2019-09-18 LAB — MAGNESIUM: Magnesium: 2.5 mg/dL — ABNORMAL HIGH (ref 1.7–2.4)

## 2019-09-18 LAB — D-DIMER, QUANTITATIVE: D-Dimer, Quant: 2.3 ug/mL-FEU — ABNORMAL HIGH (ref 0.00–0.50)

## 2019-09-18 MED ORDER — SODIUM BICARBONATE 650 MG PO TABS
650.0000 mg | ORAL_TABLET | Freq: Three times a day (TID) | ORAL | Status: DC
  Administered 2019-09-18: 650 mg via ORAL
  Filled 2019-09-18 (×8): qty 1

## 2019-09-18 MED ORDER — LACTATED RINGERS IV SOLN
INTRAVENOUS | Status: DC
  Administered 2019-09-18: 10:00:00 via INTRAVENOUS

## 2019-09-18 NOTE — Progress Notes (Signed)
Occupational Therapy Treatment Patient Details Name: [name] MRN: [id number] DOB: [birth year] Today's Date: 09/18/2019    History of present illness 73 yo female presenting with coughing, poor appetite, and diarrhea. Tested COVID +. PMH including HIV, DM type 2, renal transplant (June 2020), and HTN.   OT comments  Pt seen for OT treatment session, emotional at start of session due to toileting needs needing to be met. Throughout session pt intermittently lethargic and demonstrating impaired cognition, with delayed processing/responses to therapist and notable confusion/disorientation. Pt requiring maxA for LB bathing/pericare and minA for seated UB ADL. Pt requesting/agreeable to get OOB to recliner and performing functional transfers with minA using RW. Pt on RA with lowest SpO2 noted 88%, DOE 3/4 post transfer to recliner, remaining vitals stable (RR and HR). Given pt's current functional status and fluctuating cognition continue to recommend SNF level therapies at time of discharge. Will continue to follow while acutely admitted.    Follow Up Recommendations  SNF;Supervision/Assistance - 24 hour    Equipment Recommendations  3 in 1 bedside commode;Tub/shower seat(defer to next venue)          Precautions / Restrictions Precautions Precautions: Fall;Other (comment) Precaution Comments: watch sats with activity Restrictions Weight Bearing Restrictions: No       Mobility Bed Mobility Overal bed mobility: Needs Assistance Bed Mobility: Supine to Sit     Supine to sit: Min guard     General bed mobility comments: minguard for safety, pt requiring significant time/effort to initiate/carry out mobility task with verbal cues provided throughout  Transfers Overall transfer level: Needs assistance Equipment used: Rolling walker (2 wheeled) Transfers: Sit to/from [facility] Sit to Stand: Min assist Stand pivot transfers: Min assist       General  transfer comment: pt required boosting and steadying assist at RW, increased time and verbal cues to initiate standing; steadying assist to take steps towards recliner    Balance Overall balance assessment: Needs assistance   Sitting balance-Leahy Scale: Fair     Standing balance support: Bilateral upper extremity supported Standing balance-Leahy Scale: Poor Standing balance comment: required external assist/UE support today                           ADL either performed or assessed with clinical judgement   ADL Overall ADL's : Needs assistance/impaired   Eating/Feeding Details (indicate cue type and reason): encouraged PO intake as breakfast untouched on table, pt also with meds on her bedside table, RN in to encourage taking meds and therapist attempting as well, pt reports feeling like she will "throw them back up" if she tries to take them (denies nausea however)  Grooming: Wash/dry face;Set up;Sitting Grooming Details (indicate cue type and reason): seated EOB       Lower Body Bathing Details (indicate cue type and reason): pt refused to perform on her own, provided assist for buttocks region as pt had soiled her bed Upper Body Dressing : Minimal assistance;Sitting Upper Body Dressing Details (indicate cue type and reason): donning new gown         Toileting- Clothing Manipulation and Hygiene: Maximal assistance;Sit to/from stand Toileting - Clothing Manipulation Details (indicate cue type and reason): assist for pericare as pt refusing to attempt to perform on her own     Functional mobility during ADLs: Minimal assistance;Rolling walker General ADL Comments: pt with lethargy, impaired cognition (RN made aware as unsure if this is pt baseline); pt did  wish to get OOB to recliner so assisted with transfer post ADL completion                        Cognition Arousal/Alertness: Lethargic Behavior During Therapy: WFL for tasks assessed/performed Overall  Cognitive Status: No family/caregiver present to determine baseline cognitive functioning Area of Impairment: Attention;Following commands;Problem solving;Orientation;Memory;Safety/judgement;Awareness                 Orientation Level: Disoriented to;Situation;Place Current Attention Level: Focused   Following Commands: Follows one step commands inconsistently;Follows one step commands with increased time Safety/Judgement: Decreased awareness of deficits Awareness: Intellectual Problem Solving: Slow processing;Requires verbal cues;Requires tactile cues;Decreased initiation General Comments: pt lethargic this session, emotional at start due to no one coming in to assist with toileting needs (unsure if pt had called or not); pt quite selective in what she reponds to/commands followed (question whether partly due to pt being [facility]?), significant delay when following commands; when attempting to ask orientation questions and asking where she was pt responding "I have no idea"        Exercises     Shoulder Instructions       General Comments      Pertinent Vitals/ Pain       Pain Assessment: No/denies pain  Home Living                                          Prior Functioning/Environment              Frequency  Min 2X/week(3x/wk if pt refusing SNF)        Progress Toward Goals  OT Goals(current goals can now be found in the care plan section)  Progress towards OT goals: Not progressing toward goals - comment;OT to reassess next treatment(increased lethargy, cognitive impairments today)  Acute Rehab OT Goals Patient Stated Goal: return home at [facility] OT Goal Formulation: With patient Time For Goal Achievement: 09/28/19 Potential to Achieve Goals: Good ADL Goals Pt Will Perform Grooming: with set-up;with supervision;standing Pt Will Perform Lower Body Dressing: with set-up;with supervision;sit to/from stand Pt Will Transfer to Toilet: with  set-up;with supervision;ambulating;bedside commode Pt Will Perform Toileting - Clothing Manipulation and hygiene: with set-up;with supervision;sit to/from stand;sitting/lateral leans Additional ADL Goal #1: Pt will demonstrate increased activity tolerance to perform three ADLs in standing with Min Guard A  Plan Discharge plan remains appropriate    Co-evaluation                 AM-PAC OT "6 Clicks" Daily Activity     Outcome Measure   Help from another person eating meals?: A Little Help from another person taking care of personal grooming?: A Little Help from another person toileting, which includes using toliet, bedpan, or urinal?: A Lot Help from another person bathing (including washing, rinsing, drying)?: A Lot Help from another person to put on and taking off regular upper body clothing?: A Lot Help from another person to put on and taking off regular lower body clothing?: A Lot 6 Click Score: 14    End of Session Equipment Utilized During Treatment: Rolling walker  OT Visit Diagnosis: Unsteadiness on feet (R26.81);Other abnormalities of gait and mobility (R26.89);Muscle weakness (generalized) (M62.81);Pain   Activity Tolerance Patient limited by fatigue;Patient limited by lethargy   Patient Left in chair;with call bell/phone within reach;with chair alarm set  Nurse Communication Mobility status        Time: [id number] OT Time Calculation (min): 34 min  Charges: OT General Charges $OT Visit: 1 Visit OT Treatments $Self Care/Home Management : 23-37 mins  Lou Cal, OT Supplemental Rehabilitation Services Pager [phone] Office [place] 09/18/2019, 1:54 PM

## 2019-09-18 NOTE — TOC Transition Note (Addendum)
Transition of Care [facility]) - CM/SW Discharge Note   Patient Details  Name: [name] MRN: [id number] Date of Birth: Mar 05, 1946  [facility] CM/SW Contact:  Ninfa Meeker, RN Phone Number: [phone] (working remotely) 09/18/2019, 2:30 PM   Clinical Narrative: Patient is a  73 year old female  with history of renal transplantation on immunosuppressive 's with chronic kidney disease stage IV, HIV on antiretrovirals, chronic hepatitis C, DM-2 on insulin-presenting with 6-7 history of cough-followed by exertional dyspnea and severe diarrhea. Found to have acute hypoxic respiratory failure secondary COVID-19 pneumonia along with AKI on CKD stage III with metabolic acidosis.Subsequently transferred to [facility].  Patient has refused SNF at discharge and wants to go home. Case Manager spoke with patient's daughter, [name], concerning her mom's  Discharge plan. [name] said her mom lives alone and Tanya's sister in law lives next door. They will be checking in on patient. Choice for Home Health offered, referral called to Adela Lank, [facility] Liaison. Patient will need RW and 3in1, and CM has requested that  [place] deliver to patient's room. Patient is saturating 96% on RA, no oxygen needed for discharge. [name] said she will pick her mom up tomorrow when discharged.           Final next level of care: [facility] Barriers to Discharge: No Barriers Identified   Patient Goals and CMS Choice Patient states their goals for this hospitalization and ongoing recovery are:: to get better CMS Medicare.gov Compare Post Acute Care list provided to:: Patient Represenative (must comment)(daughter [name])    Discharge Placement                       Discharge Plan and Services In-house Referral: NA Discharge Planning Services: CM Consult Post Acute Care Choice: Durable Medical Equipment, Home Health          DME Arranged:  3-N-1, [facility]ile DME Agency: [place]                  Social Determinants of Health (SDOH) Interventions     Readmission Risk Interventions No flowsheet data found.

## 2019-09-18 NOTE — Progress Notes (Signed)
Spoke with daughter. Update given. No further questions at this time. All concerns addressed.

## 2019-09-18 NOTE — Progress Notes (Signed)
PROGRESS NOTE                                                                                                                                                                                                             Patient Demographics:    [name], is a 73 y.o. female, DOB - 1946-03-30, GTX:[id number]  Outpatient Primary MD for the patient is Earlene Plater, MD    LOS - 5  Admit date - 09/12/2019    Chief Complaint  Patient presents with  . Neck Pain    fever  . Abdominal Pain       Brief Narrative  - 73 year old with history of renal transplantation on immunosuppressive's with chronic kidney disease stage IV, HIV on antiretrovirals, chronic hepatitis C, DM-2 on insulin-presenting with 6-7 history of cough-followed by exertional dyspnea and severe diarrhea.  Found to have acute hypoxic respiratory failure secondary COVID-19 pneumonia along with AKI on CKD stage III with metabolic acidosis.  Subsequently transferred to [facility].   Subjective:   Patient in bed, appears comfortable, denies any headache, no fever, no chest pain or pressure, no shortness of breath , no abdominal pain. No focal weakness.   Assessment  & Plan :     1. Acute Covid 19 Viral gastroenteritis and pneumonitis during the ongoing 2020 Covid 19 Pandemic - she did have mild acute hypoxic respiratory failure.  Due to her underlying immunocompromised state and elevated procalcitonin she has been adequately started on IV Rocephin, azithromycin combination along with IV steroids and Remdisvir.  She is clinically improving.  Will trend inflammatory markers and procalcitonin.  Encouraged to sit up in chair in the daytime use flutter valve and I-S for pulmonary toiletry and prone in bed at night.   Note she is a poor candidate for Actemra due to elevated procalcitonin and history of chronic hepatitis C.   COVID-19 Labs  Recent Labs    09/16/19 0405 09/17/19 0225 09/17/19 0255 09/18/19 0320  DDIMER 2.50* 2.51*  --  2.30*  CRP 5.5*  --  3.8* 3.2*    Lab Results  Component Value Date   SARSCOV2NAA POSITIVE (A) 09/12/2019   [place] Not Detected 05/09/2019     Hepatic Function Latest Ref Rng & Units 09/18/2019 09/17/2019 09/16/2019  Total Protein 6.5 - 8.1 g/dL 5.9(L) 6.0(L) 6.3(L)  Albumin  3.5 - 5.0 g/dL 2.6(L) 2.6(L) 2.5(L)  AST 15 - 41 U/L 25 29 31   ALT 0 - 44 U/L 17 18 17   Alk Phosphatase 38 - 126 U/L 44 43 39  Total Bilirubin 0.3 - 1.2 mg/dL 0.3 0.3 0.3  Bilirubin, Direct 0.0 - 0.3 mg/dL - - -        Component Value Date/Time   BNP 242.9 (H) 06/17/2018 1955      2.  AKI in a patient with status post renal transplant on immunosuppressive therapy with a baseline kidney function of CKD 4, ? Mild Prograf toxicity along with ATN due to hypotension and dehydration.  Adequately hydrated, renal function had come down somewhat however it is trending up again on 09/17/2019.  Have ordered renal ultrasound along with urine electrolytes, resume hydration which was stopped yesterday after 3 days of IV fluids.  Continue to hold Lasix for now and monitor.  Prograf levels which were initially high upon admission have now trended down.  This was initially discussed by previous physician with nephrologist Dr. Moshe Cipro, I discussed her case with Dr. Justin Mend and [facility] on 09/17/2019.  Currently continued on IV steroids along with home immunomodulators, pharmacy monitoring the doses.    Ref Range & Units 2d ago 4d ago  Tacrolimus (FK506) - [facility] 2.0 - 20.0 ng/mL 6.9  30.8High  CM      3.  History of HIV and chronic hepatitis C.  CD4 count on 05/07/2019 was 214.  Pharmacy dosing ART.  Lamivudine at 50 mg due to AKI.  4.  Essential hypertension currently on combination of Norvasc, Coreg continue and monitor.  5.  Deconditioning.  PT OT eval, may require SNF placement.  6.  GERD.  On PPI.  7.  Dyslipidemia.   Continue statin.  8.  DM type II.  Currently on Lantus and sliding scale will monitor and adjust.  Lantus dose increased on 09/17/2019 for better control.  Lab Results  Component Value Date   HGBA1C 6.9 (H) 06/17/2018   CBG (last 3)  Recent Labs    09/17/19 1659 09/17/19 2017 09/18/19 0737  GLUCAP 234* 205* 195*     Condition - Fair  Family Communication  : Daughter on 09/14/19, 09/16/19, 09/17/19, 09/18/19  Per daughter patient is non complaint with Meds at home.  Code Status : Full  Diet :   Diet Order            Diet renal/carb modified with fluid restriction Diet-HS Snack? Nothing; Fluid restriction: 1200 mL Fluid; Room service appropriate? Yes; Fluid consistency: Thin  Diet effective now               Disposition Plan  :  Inpt  Consults  :  None  Procedures  :    PUD Prophylaxis : PPI  DVT Prophylaxis  :   Heparin    Lab Results  Component Value Date   PLT 268 09/18/2019    Inpatient Medications  Scheduled Meds: . amLODipine  10 mg Oral Daily  . aspirin  81 mg Oral Daily  . calcitRIOL  0.25 mcg Oral Daily  . carvedilol  25 mg Oral BID WC  . cinacalcet  60 mg Oral Daily  . dolutegravir  50 mg Oral Daily  . heparin  5,000 Units Subcutaneous Q8H  . insulin aspart  0-9 Units Subcutaneous TID WC  . insulin glargine  10 Units Subcutaneous QHS  . lamiVUDine  50 mg Oral Daily  . magnesium oxide  400 mg Oral Daily  . methylPREDNISolone (SOLU-MEDROL) injection  20 mg Intravenous Daily  . mycophenolate  540 mg Oral BID  . pantoprazole  40 mg Oral Daily  . potassium chloride SA  20 mEq Oral Daily  . rosuvastatin  20 mg Oral q1800  . sodium bicarbonate  650 mg Oral TID  . tacrolimus  2 mg Oral Q24H   And  . tacrolimus  3 mg Oral Q24H   Continuous Infusions: . lactated ringers 75 mL/hr at 09/18/19 0953   PRN Meds:.acetaminophen **OR** [DISCONTINUED] acetaminophen, benzonatate, loperamide, [DISCONTINUED] ondansetron **OR** ondansetron (ZOFRAN) IV   Antibiotics  :    Anti-infectives (From admission, onward)   Start     Dose/Rate Route Frequency Ordered Stop   09/14/19 1600  remdesivir 100 mg in sodium chloride 0.9 % 250 mL IVPB     100 mg 500 mL/hr over 30 Minutes Intravenous Every 24 hours 09/13/19 1222 09/18/19 0219   09/13/19 1800  cefTRIAXone (ROCEPHIN) 1 g in sodium chloride 0.9 % 100 mL IVPB  Status:  Discontinued     1 g 200 mL/hr over 30 Minutes Intravenous Every 24 hours 09/13/19 1631 09/17/19 0751   09/13/19 1730  azithromycin (ZITHROMAX) 500 mg in sodium chloride 0.9 % 250 mL IVPB  Status:  Discontinued     500 mg 250 mL/hr over 60 Minutes Intravenous Every 24 hours 09/13/19 1631 09/17/19 0745   09/13/19 1700  lamiVUDine (EPIVIR) 10 MG/ML solution 50 mg     50 mg Oral Daily 09/13/19 1621     09/13/19 1615  lamivudine (EPIVIR) tablet 50 mg  Status:  Discontinued     50 mg Oral Daily 09/13/19 1612 09/13/19 1621   09/13/19 1600  dolutegravir (TIVICAY) tablet 50 mg     50 mg Oral Daily 09/13/19 1536     09/13/19 1400  remdesivir 200 mg in sodium chloride 0.9 % 250 mL IVPB     200 mg 500 mL/hr over 30 Minutes Intravenous Once 09/13/19 1222 09/13/19 1558   09/13/19 1000  dolutegravir (TIVICAY) tablet 50 mg  Status:  Discontinued     50 mg Oral Daily 09/13/19 0535 09/13/19 1536       Time Spent in minutes  30   Lala Lund M.D on 09/18/2019 at 10:58 AM  To page go to [email or web address] - password [facility]  [facility] -  Office  [phone]     See all Orders from today for further details    Objective:   Vitals:   09/17/19 2341 09/18/19 0423 09/18/19 0500 09/18/19 0729  BP:  (!) 172/73  (!) 167/78  Pulse:  67  72  Resp: 20 20  (!) 24  Temp:  98.2 F (36.8 C)  98 F (36.7 C)  TempSrc:    Oral  SpO2:  94%  97%  Weight:   68.9 kg   Height:        Wt Readings from Last 3 Encounters:  09/18/19 68.9 kg  05/27/19 65.8 kg  11/05/18 64.4 kg     Intake/Output Summary (Last 24 hours) at 09/18/2019  1058 Last data filed at 09/18/2019 0400 Gross per 24 hour  Intake 2000.79 ml  Output 302 ml  Net 1698.79 ml     Physical Exam  Awake Alert, Oriented X 3, No new F.N deficits, Normal affect [place].AT,PERRAL Supple Neck,No JVD, No cervical lymphadenopathy appriciated.  Symmetrical Chest wall movement, Good air movement bilaterally, CTAB RRR,No Gallops, Rubs or new Murmurs, No Parasternal Heave +  ve B.Sounds, Abd Soft, No tenderness, No organomegaly appriciated, No rebound - guarding or rigidity. No Cyanosis, Clubbing or edema, No new Rash or bruise   Data Review:    CBC   Recent Labs  Lab 09/14/19 0420 09/15/19 0123 09/16/19 0405 09/17/19 0225 09/18/19 0320  WBC 3.3* 5.4 6.7 9.1 9.9  HGB 8.9* 8.6* 10.1* 9.7* 9.2*  HCT 28.1* 27.4* 31.4* 29.7* 28.6*  PLT 182 191 239 266 268  MCV 89.8 89.3 90.2 87.4 87.5  MCH 28.4 28.0 29.0 28.5 28.1  MCHC 31.7 31.4 32.2 32.7 32.2  RDW 14.3 14.5 14.8 14.7 14.9  LYMPHSABS 0.3* 0.3* 0.2* 0.3* 0.3*  MONOABS 0.1 0.2 0.3 0.6 0.8  EOSABS 0.0 0.0 0.0 0.0 0.0  BASOSABS 0.0 0.0 0.0 0.0 0.0    Chemistries   Recent Labs  Lab 09/14/19 0420 09/15/19 0123 09/16/19 0405 09/17/19 0225 09/18/19 0320  NA 138 139 140 143 141  K 4.1 4.0 4.4 4.4 4.1  CL 110 111 111 113* 113*  CO2 12* 13* 12* 14* 14*  GLUCOSE 178* 223* 238* 224* 191*  BUN 68* 72* 80* 94* 93*  CREATININE 4.22* 4.14* 4.30* 4.70* 4.54*  CALCIUM 8.3* 8.1* 8.7* 9.1 9.1  MG 1.9 1.9 2.2 2.4 2.5*  AST 38 32 31 29 25   ALT 19 16 17 18 17   ALKPHOS 36* 36* 39 43 44  BILITOT 0.4 0.3 0.3 0.3 0.3   ------------------------------------------------------------------------------------------------------------------ No results for input(s): CHOL, HDL, LDLCALC, TRIG, CHOLHDL, LDLDIRECT in the last 72 hours.  Lab Results  Component Value Date   HGBA1C 6.9 (H) 06/17/2018   ------------------------------------------------------------------------------------------------------------------ No  results for input(s): TSH, T4TOTAL, T3FREE, THYROIDAB in the last 72 hours.  Invalid input(s): FREET3  Cardiac Enzymes No results for input(s): CKMB, TROPONINI, MYOGLOBIN in the last 168 hours.  Invalid input(s): CK ------------------------------------------------------------------------------------------------------------------    Component Value Date/Time   BNP 242.9 (H) 06/17/2018 1955    Micro Results Recent Results (from the past 240 hour(s))  Culture, blood (routine x 2)     Status: None   Collection Time: 09/12/19  6:46 PM   Specimen: BLOOD RIGHT HAND  Result Value Ref Range Status   Specimen Description BLOOD RIGHT HAND  Final   Special Requests   Final    BOTTLES DRAWN AEROBIC ONLY Blood Culture adequate volume   Culture   Final    NO GROWTH 6 DAYS Performed at [facility], [address], [place], [place] [address]    Report Status 09/18/2019 FINAL  Final  SARS CORONAVIRUS 2 (TAT 6-24 HRS) Nasopharyngeal Nasopharyngeal Swab     Status: Abnormal   Collection Time: 09/12/19  6:48 PM   Specimen: Nasopharyngeal Swab  Result Value Ref Range Status   SARS Coronavirus 2 POSITIVE (A) NEGATIVE Final    Comment: RESULT CALLED TO, READ BACK BY AND VERIFIED WITH: P. PEREZ,RN 0135 09/13/2019 T. TYSOR (NOTE) SARS-CoV-2 target nucleic acids are DETECTED. The SARS-CoV-2 RNA is generally detectable in upper and lower respiratory specimens during the acute phase of infection. Positive results are indicative of active infection with SARS-CoV-2. Clinical  correlation with patient history and other diagnostic information is necessary to determine patient infection status. Positive results do  not rule out bacterial infection or co-infection with other viruses. The expected result is Negative. Fact Sheet for Patients: SugarRoll.be Fact Sheet for Healthcare Providers: https://www.woods-mathews.com/ This test is not yet approved or  cleared by the [place] FDA and  has been authorized for detection  and/or diagnosis of SARS-CoV-2 by FDA under an Emergency Use Authorization (EUA). This EUA will remain  in effect (meaning this test can be used) for  the duration of the COVID-19 declaration under Section 564(b)(1) of the Act, 21 U.S.C. section 360bbb-3(b)(1), unless the authorization is terminated or revoked sooner. Performed at [facility], [place] [address], [place], [place] [address]   Culture, blood (routine x 2)     Status: None   Collection Time: 09/12/19  7:39 PM   Specimen: BLOOD  Result Value Ref Range Status   Specimen Description BLOOD RIGHT ANTECUBITAL  Final   Special Requests   Final    BOTTLES DRAWN AEROBIC AND ANAEROBIC Blood Culture adequate volume   Culture   Final    NO GROWTH 6 DAYS Performed at [facility], [place] [address] [place], [place] [address]    Report Status 09/18/2019 FINAL  Final    Radiology Reports US Renal  Result Date: 09/17/2019 CLINICAL DATA:  Acute kidney injury EXAM: RENAL / URINARY TRACT ULTRASOUND COMPLETE COMPARISON:  None. FINDINGS: Right Kidney: Renal measurements: 9.2 x 4.7 x 4.6 cm = volume: 104 mL. Markedly increased echotexture and cortical thinning. 1.2 cm cyst in the midpole. No hydronephrosis. Left Kidney: Renal measurements: 9.1 x 4.8 x 4.7 cm = volume: 108 mL. Markedly increased echotexture and cortical thinning. No mass or hydronephrosis. Bladder: Appears normal for degree of bladder distention. Incidentally noted is ascites in the lower abdomen/pelvis. IMPRESSION: Markedly increased echotexture within the kidneys with cortical thinning compatible with chronic medical renal disease. No hydronephrosis. Ascites. Electronically Signed   By: Rolm Baptise M.D.   On: 09/17/2019 12:05   Dg Chest Portable 1 View  Result Date: 09/12/2019 CLINICAL DATA:  Cough and fever. EXAM: PORTABLE CHEST 1 VIEW COMPARISON:  06/17/2018 FINDINGS: There are patchy faint  bilateral peripheral infiltrates in both mid zones and at both lung bases worrisome for viral pneumonia. Heart size and pulmonary vascularity are normal. No effusions. No significant bone abnormality. IMPRESSION: Patchy bilateral peripheral infiltrates in the mid zones and at both lung bases worrisome for viral pneumonia. No other significant abnormalities. Electronically Signed   By: Lorriane Shire M.D.   On: 09/12/2019 18:16

## 2019-09-19 LAB — BASIC METABOLIC PANEL
Anion gap: 12 (ref 5–15)
BUN: 97 mg/dL — ABNORMAL HIGH (ref 8–23)
CO2: 15 mmol/L — ABNORMAL LOW (ref 22–32)
Calcium: 9 mg/dL (ref 8.9–10.3)
Chloride: 117 mmol/L — ABNORMAL HIGH (ref 98–111)
Creatinine, Ser: 4.6 mg/dL — ABNORMAL HIGH (ref 0.44–1.00)
GFR calc Af Amer: 10 mL/min — ABNORMAL LOW (ref >60)
GFR calc non Af Amer: 9 mL/min — ABNORMAL LOW (ref >60)
Glucose, Bld: 272 mg/dL — ABNORMAL HIGH (ref 70–99)
Potassium: 4.3 mmol/L (ref 3.5–5.1)
Sodium: 144 mmol/L (ref 135–145)

## 2019-09-19 LAB — GLUCOSE, CAPILLARY
Glucose-Capillary: 227 mg/dL — ABNORMAL HIGH (ref 70–99)
Glucose-Capillary: 222 mg/dL — ABNORMAL HIGH (ref 70–99)

## 2019-09-19 MED ORDER — SODIUM BICARBONATE 650 MG PO TABS: 650.0000 mg | ORAL_TABLET | Freq: Three times a day (TID) | ORAL | 0 refills | Status: AC

## 2019-09-19 MED ORDER — ONDANSETRON HCL 4 MG PO TABS: 4.0000 mg | ORAL_TABLET | Freq: Three times a day (TID) | ORAL | 0 refills | Status: AC | PRN

## 2019-09-19 MED ORDER — FUROSEMIDE 80 MG PO TABS: 80.0000 mg | ORAL_TABLET | Freq: Every day | ORAL | Status: AC

## 2019-09-19 MED ORDER — LOPERAMIDE HCL 2 MG PO TABS: 2.0000 mg | ORAL_TABLET | Freq: Three times a day (TID) | ORAL | 0 refills | Status: AC | PRN

## 2019-09-19 MED ORDER — LACTATED RINGERS IV SOLN: INTRAVENOUS | Status: DC

## 2019-09-19 NOTE — Discharge Summary (Signed)
[name] [id number] DOB: [birth year] DOA: 09/12/2019  PCP: Earlene Plater, MD  Admit date: 09/12/2019  Discharge date: 09/19/2019  Admitted From: Home   Disposition:  Home   Recommendations for Outpatient Follow-up:   Follow up with PCP in 1-2 weeks  PCP Please obtain BMP/CBC, 2 view CXR in 1week,  (see Discharge instructions)   PCP Please follow up on the following pending results: Monitor blood pressure, BMP closely.  Needs close nephrology follow-up.  May require dialysis soon.  Very noncompliant with medications.   Home Health: Pt, RN Equipment/Devices: Rolling walker  Consultations: None  Discharge Condition: Stable    CODE STATUS: Full    Diet Recommendation: Renal diet with 1.5 L/day fluid restriction  Chief Complaint  Patient presents with  . Neck Pain    fever  . Abdominal Pain     Brief history of present illness from the day of admission and additional interim summary    73 year old with history of renal transplantation on immunosuppressive's with chronic kidney disease stage IV, HIV on antiretrovirals, chronic hepatitis C, DM-2 on insulin-presenting with 6-7 history of cough-followed by exertional dyspnea and severe diarrhea. Found to have acute hypoxic respiratory failure secondary COVID-19 pneumonia along with AKI on CKD stage III with metabolic acidosis.Subsequently transferred to [facility]     1. Acute Covid 19 Viral gastroenteritis and pneumonitis during the ongoing 2020 Covid 19 Pandemic - she did have mild acute hypoxic respiratory failure.  Due to her underlying immunocompromised state and elevated procalcitonin she treated with combination of Rocephin, azithromycin for possible  underlying bacterial pneumonia chances of which appear low, along with that she received COVID specific treatment with IV steroids at [facility] both of which she has now finished, she is now on her home dose of steroids only.  Antibiotic course is also been completed.  She is now symptom-free on room air for the last 2 days.  Will be discharged home with outpatient PCP follow-up   COVID-19 Labs  Recent Labs    09/17/19 0225 09/17/19 0255 09/18/19 0320  DDIMER 2.51*  --  2.30*  CRP  --  3.8* 3.2*    Lab Results  Component Value Date   SARSCOV2NAA POSITIVE (A) 09/12/2019   [place] Not Detected 05/09/2019     2.  AKI in a patient with status post renal transplant on immunosuppressive therapy with a baseline kidney function of CKD 4, ? Mild Prograf toxicity along with ATN due to hypotension and dehydration.    Fractional excretion of sodium was less than 1, she was adequately hydrated with IV fluids and her Lasix was held, renal ultrasound was  nonacute.  Her Prograf levels were high but this was likely her peak and not trough.  Pharmacy was consulted who continued her home medications at home dose including Prograf.  She has good urine output and renal function has plateaued, case was discussed with nephrologist Dr. Justin Mend.  She has been requested to hold her Lasix for another 2 days and then follow-up with PCP.  She also has some metabolic acidosis due to worsening renal function for which she has been placed on oral sodium bicarb.  Throughout her hospital stay patient was noncompliant with medications and lab draws, continues to refuse intermittently despite counseling.  I discussed this with patient's daughter who states that patient does this at home as well.  They both have been warned that if she continues to do this her renal function could soon decline to a point that she will require dialysis.  I have requested home PT and RN for follow-up as well     Ref Range & Units 2d ago 4d ago   Tacrolimus (FK506) - [facility] 2.0 - 20.0 ng/mL 6.9  30.8High  CM      3.  History of HIV and chronic hepatitis C.  CD4 count on 05/07/2019 was 214.  Pharmacy dosing ART.  Lamivudine at 50 mg due to AKI.  4.  Essential hypertension currently on combination of Norvasc, Coreg continue and monitor.  Counseled on compliance.  Refuses medications here frequently.  5.  Deconditioning.  PT OT eval, may require SNF placement.  6.  GERD.  On PPI.  7.  Dyslipidemia.  Continue statin.  8.  DM type II.  Stable outpatient control continue home medications.  Lab Results  Component Value Date   HGBA1C 6.9 (H) 06/17/2018    9.  Chronic intermittent diarrhea and nausea vomiting.  Likely diabetic gastroparesis or autonomic insufficiency.  PRN Zofran and Imodium.  May benefit from outpatient GI follow-up.  Discharge diagnosis     Principal Problem:   Acute respiratory failure with hypoxia (HCC) Active Problems:   DM2 (diabetes mellitus, type 2) (HCC)   Essential hypertension, benign   Anemia   GERD (gastroesophageal reflux disease)   Kidney transplant recipient   History of hepatitis C   AIDS (acquired immune deficiency syndrome) ([place])   COVID-19 virus infection    Discharge instructions    Discharge Instructions    Discharge instructions   Complete by: As directed    Follow with Primary MD Earlene Plater, MD in 7 days   Get CBC, CMP, 2 view Chest X ray -  checked next visit within 1 week by Primary MD    Activity: As tolerated with Full fall precautions use walker/cane & assistance as needed  Disposition Home    Diet: Renal, 1.5lit/day fluid restriction  Accuchecks 4 times/day, Once in AM empty stomach and then before each meal. Log in all results and show them to your Prim.MD in 3 days. If any glucose reading is under 80 or above 300 call your Prim MD immidiately. Follow Low glucose instructions for glucose under 80 as instructed.  Special Instructions: If you have  smoked or chewed Tobacco  in the last 2 yrs please stop smoking, stop any regular Alcohol  and or any Recreational drug use.  On your next visit with your primary care physician please Get Medicines reviewed and adjusted.  Please request your Prim.MD to go over all Hospital Tests and Procedure/Radiological results at the follow up, please get all Hospital records sent to your  Prim MD by signing hospital release before you go home.  If you experience worsening of your admission symptoms, develop shortness of breath, life threatening emergency, suicidal or homicidal thoughts you must seek medical attention immediately by calling 911 or calling your MD immediately  if symptoms less severe.  You Must read complete instructions/literature along with all the possible adverse reactions/side effects for all the Medicines you take and that have been prescribed to you. Take any new Medicines after you have completely understood and accpet all the possible adverse reactions/side effects.   Increase activity slowly   Complete by: As directed    MyChart COVID-19 home monitoring program   Complete by: Sep 19, 2019    Is the patient willing to use the [place] for home monitoring?: Yes   Temperature monitoring   Complete by: Sep 19, 2019    After how many days would you like to receive a notification of this patient's flowsheet entries?: 1      Discharge Medications   Allergies as of 09/19/2019      Reactions   Omeprazole Other (See Comments)   Interferes with the absorption of rilipivirine   Eggs Or Egg-derived Products Nausea And Vomiting   Influenza Vaccines Other (See Comments)   Hallucination      Medication List    STOP taking these medications   loratadine 10 MG tablet Commonly known as: Claritin     TAKE these medications   amLODipine 10 MG tablet Commonly known as: NORVASC TAKE 1 TABLET(10 MG) BY MOUTH DAILY What changed: See the new instructions.   aspirin 81 MG EC  tablet Take 81 mg by mouth daily.   calcitRIOL 0.25 MCG capsule Commonly known as: ROCALTROL Take 0.25 mcg by mouth daily.   carvedilol 25 MG tablet Commonly known as: COREG Take 1 tablet (25 mg total) by mouth 2 (two) times daily with a meal.   cinacalcet 60 MG tablet Commonly known as: SENSIPAR Take 60 mg by mouth daily.   dolutegravir 50 MG tablet Commonly known as: Tivicay Take 1 tablet (50 mg total) by mouth daily.   fluticasone 50 MCG/ACT nasal spray Commonly known as: FLONASE SHAKE LIQUID WELL AND USE 1 SPRAY IN EACH NOSTRIL DAILY   freestyle lancets Use as instructed   FREESTYLE TEST STRIPS test strip Generic drug: glucose blood Use to check blood sugar 4 times daily. diag code E11.9. insulin dependent   furosemide 80 MG tablet Commonly known as: LASIX Take 1 tablet (80 mg total) by mouth daily. Start taking on: September 20, 2019   Insulin Pen Needle 32G X 4 MM Misc 16 Units by Does not apply route daily.   lamivudine 100 MG tablet Commonly known as: EPIVIR Take 1 tablet (100 mg total) by mouth daily.   loperamide 2 MG tablet Commonly known as: Imodium A-D Take 1 tablet (2 mg total) by mouth 3 (three) times daily as needed for diarrhea or loose stools.   magnesium oxide 400 MG tablet Commonly known as: MAG-OX Take 400 mg by mouth daily.   mycophenolate 180 MG EC tablet Commonly known as: MYFORTIC Take 540 mg by mouth 2 (two) times daily.   ondansetron 4 MG tablet Commonly known as: Zofran Take 1 tablet (4 mg total) by mouth every 8 (eight) hours as needed for nausea or vomiting.   pantoprazole 20 MG tablet Commonly known as: PROTONIX Take 1 tablet (20 mg total) by mouth daily.   potassium chloride SA 20 MEQ tablet  Commonly known as: KLOR-CON Take 20 mEq by mouth daily.   predniSONE 5 MG tablet Commonly known as: DELTASONE Take 5 mg by mouth daily with breakfast.   rosuvastatin 20 MG tablet Commonly known as: CRESTOR TAKE 1 TABLET(20 MG)  BY MOUTH DAILY What changed:   how much to take  how to take this  when to take this  additional instructions   sodium bicarbonate 650 MG tablet Take 1 tablet (650 mg total) by mouth 3 (three) times daily.   tacrolimus 1 MG capsule Commonly known as: PROGRAF Take 2-3 mg by mouth See admin instructions. Take 2 mg by mouth at 0400 and take 3 mg by mouth at 1500.   Voltaren 1 % Gel Generic drug: diclofenac sodium APPLY 2 GRAMS EXTERNALLY TO THE AFFECTED AREA FOUR TIMES DAILY What changed: See the new instructions.   Xultophy 100-3.6 UNIT-MG/ML Sopn Generic drug: Insulin Degludec-Liraglutide Inject 16 Units into the skin every morning.            Durable Medical Equipment  (From admission, onward)         Start     Ordered   09/18/19 1428  For home use only DME 3 n 1  Once     09/18/19 1428   09/18/19 1428  For home use only DME Walker rolling  Once    Question:  Patient needs a walker to treat with the following condition  Answer:  Generalized weakness   09/18/19 1428   09/16/19 1006  For home use only DME Walker rolling  Once    Comments: 5 wheel  Question:  Patient needs a walker to treat with the following condition  Answer:  Weakness   09/16/19 1006          Follow-up Information    [facility] Follow up.   Specialty: [place] Why: A representative from [facility] will contact you to arrange start date and time for your therapy.  Contact information: [place] [address] [phone]        Mauricia Area, MD. Schedule an appointment as soon as possible for a visit in 1 week(s).   Specialty: Nephrology Contact information: [place] [address] [phone]        Tommy Medal, Lavell Islam, MD. Schedule an appointment as soon as possible for a visit in 1 week(s).   Specialty: Infectious Diseases Contact information: [address] [address]  [phone]        Earlene Plater, MD. Schedule an appointment as soon as possible for a visit in 1 week(s).   Specialty: Internal Medicine Contact information: [address] [place] [address] [phone]           Major procedures and Radiology Reports - PLEASE review detailed and final reports thoroughly  -        US Renal  Result Date: 09/17/2019 CLINICAL DATA:  Acute kidney injury EXAM: RENAL / URINARY TRACT ULTRASOUND COMPLETE COMPARISON:  None. FINDINGS: Right Kidney: Renal measurements: 9.2 x 4.7 x 4.6 cm = volume: 104 mL. Markedly increased echotexture and cortical thinning. 1.2 cm cyst in the midpole. No hydronephrosis. Left Kidney: Renal measurements: 9.1 x 4.8 x 4.7 cm = volume: 108 mL. Markedly increased echotexture and cortical thinning. No mass or hydronephrosis. Bladder: Appears normal for degree of bladder distention. Incidentally noted is ascites in the lower abdomen/pelvis. IMPRESSION: Markedly increased echotexture within the kidneys  with cortical thinning compatible with chronic medical renal disease. No hydronephrosis. Ascites. Electronically Signed   By: Rolm Baptise M.D.   On: 09/17/2019 12:05   Dg Chest Portable 1 View  Result Date: 09/12/2019 CLINICAL DATA:  Cough and fever. EXAM: PORTABLE CHEST 1 VIEW COMPARISON:  06/17/2018 FINDINGS: There are patchy faint bilateral peripheral infiltrates in both mid zones and at both lung bases worrisome for viral pneumonia. Heart size and pulmonary vascularity are normal. No effusions. No significant bone abnormality. IMPRESSION: Patchy bilateral peripheral infiltrates in the mid zones and at both lung bases worrisome for viral pneumonia. No other significant abnormalities. Electronically Signed   By: Lorriane Shire M.D.   On: 09/12/2019 18:16    Micro Results     Recent Results (from the past 240 hour(s))  Culture, blood (routine x 2)     Status: None   Collection Time: 09/12/19  6:46 PM    Specimen: BLOOD RIGHT HAND  Result Value Ref Range Status   Specimen Description BLOOD RIGHT HAND  Final   Special Requests   Final    BOTTLES DRAWN AEROBIC ONLY Blood Culture adequate volume   Culture   Final    NO GROWTH 6 DAYS Performed at [facility], [address], [place], [place] [address]    Report Status 09/18/2019 FINAL  Final  SARS CORONAVIRUS 2 (TAT 6-24 HRS) Nasopharyngeal Nasopharyngeal Swab     Status: Abnormal   Collection Time: 09/12/19  6:48 PM   Specimen: Nasopharyngeal Swab  Result Value Ref Range Status   SARS Coronavirus 2 POSITIVE (A) NEGATIVE Final    Comment: RESULT CALLED TO, READ BACK BY AND VERIFIED WITH: P. PEREZ,RN 0135 09/13/2019 T. TYSOR (NOTE) SARS-CoV-2 target nucleic acids are DETECTED. The SARS-CoV-2 RNA is generally detectable in upper and lower respiratory specimens during the acute phase of infection. Positive results are indicative of active infection with SARS-CoV-2. Clinical  correlation with patient history and other diagnostic information is necessary to determine patient infection status. Positive results do  not rule out bacterial infection or co-infection with other viruses. The expected result is Negative. Fact Sheet for Patients: SugarRoll.be Fact Sheet for Healthcare Providers: https://www.woods-mathews.com/ This test is not yet approved or cleared by the [place] FDA and  has been authorized for detection and/or diagnosis of SARS-CoV-2 by FDA under an Emergency Use Authorization (EUA). This EUA will remain  in effect (meaning this test can be used) for  the duration of the COVID-19 declaration under Section 564(b)(1) of the Act, 21 U.S.C. section 360bbb-3(b)(1), unless the authorization is terminated or revoked sooner. Performed at [facility], [place] [address] [place], [place] [address]   Culture, blood (routine x 2)     Status: None   Collection Time: 09/12/19   7:39 PM   Specimen: BLOOD  Result Value Ref Range Status   Specimen Description BLOOD RIGHT ANTECUBITAL  Final   Special Requests   Final    BOTTLES DRAWN AEROBIC AND ANAEROBIC Blood Culture adequate volume   Culture   Final    NO GROWTH 6 DAYS Performed at [facility], [place] [address], [place], [place] [address]    Report Status 09/18/2019 FINAL  Final    Today   Subjective    [name] today has no headache,no chest abdominal pain,no new weakness tingling or numbness, feels much better wants to go home today.    Objective   Blood pressure (!) 175/80, pulse 62, temperature  97.8 F (36.6 C), temperature source Oral, resp. rate (!) 22, height 5\' 3"  (1.6 m), weight 68.9 kg, SpO2 92 %.   Intake/Output Summary (Last 24 hours) at 09/19/2019 1002 Last data filed at 09/19/2019 0820 Gross per 24 hour  Intake 860 ml  Output 850 ml  Net 10 ml    Exam Awake Alert, Oriented x 3, No new F.N deficits, flat affect [place].AT,PERRAL Supple Neck,No JVD, No cervical lymphadenopathy appriciated.  Symmetrical Chest wall movement, Good air movement bilaterally, CTAB RRR,No Gallops,Rubs or new Murmurs, No Parasternal Heave +ve B.Sounds, Abd Soft, Non tender, No organomegaly appriciated, No rebound -guarding or rigidity. No Cyanosis, Clubbing or edema, No new Rash or bruise   Data Review   CBC w Diff:  Lab Results  Component Value Date   WBC 9.9 09/18/2019   HGB 9.2 (L) 09/18/2019   HCT 28.6 (L) 09/18/2019   PLT 268 09/18/2019   LYMPHOPCT 3 09/18/2019   MONOPCT 8 09/18/2019   EOSPCT 0 09/18/2019   BASOPCT 0 09/18/2019    CMP:  Lab Results  Component Value Date   NA 144 09/19/2019   K 4.3 09/19/2019   CL 117 (H) 09/19/2019   CO2 15 (L) 09/19/2019   BUN 97 (H) 09/19/2019   CREATININE 4.60 (H) 09/19/2019   CREATININE 3.50 (H) 05/07/2019   PROT 5.9 (L) 09/18/2019   ALBUMIN 2.6 (L) 09/18/2019   BILITOT 0.3 09/18/2019   ALKPHOS 44 09/18/2019   AST 25 09/18/2019   ALT  17 09/18/2019  .   Total Time in preparing paper work, data evaluation and todays exam - 53 minutes  Lala Lund M.D on 09/19/2019 at 10:02 AM  [facility]   Office  [phone]

## 2019-09-19 NOTE — Discharge Instructions (Signed)
Follow with Primary MD Earlene Plater, MD in 7 days   Get CBC, CMP, 2 view Chest X ray -  checked next visit within 1 week by Primary MD    Activity: As tolerated with Full fall precautions use walker/cane & assistance as needed  Disposition Home    Diet: Renal, 1.5lit/day fluid restriction  Accuchecks 4 times/day, Once in AM empty stomach and then before each meal. Log in all results and show them to your Prim.MD in 3 days. If any glucose reading is under 80 or above 300 call your Prim MD immidiately. Follow Low glucose instructions for glucose under 80 as instructed.  Special Instructions: If you have smoked or chewed Tobacco  in the last 2 yrs please stop smoking, stop any regular Alcohol  and or any Recreational drug use.  On your next visit with your primary care physician please Get Medicines reviewed and adjusted.  Please request your Prim.MD to go over all Hospital Tests and Procedure/Radiological results at the follow up, please get all Hospital records sent to your Prim MD by signing hospital release before you go home.  If you experience worsening of your admission symptoms, develop shortness of breath, life threatening emergency, suicidal or homicidal thoughts you must seek medical attention immediately by calling 911 or calling your MD immediately  if symptoms less severe.  You Must read complete instructions/literature along with all the possible adverse reactions/side effects for all the Medicines you take and that have been prescribed to you. Take any new Medicines after you have completely understood and accpet all the possible adverse reactions/side effects.       Person Under Monitoring Name: [name]  Location: [place] [address]   Infection Prevention Recommendations for Individuals Confirmed to have, or Being Evaluated for, 2019 Novel Coronavirus (COVID-19) Infection Who Receive Care at Home  Individuals who are  confirmed to have, or are being evaluated for, COVID-19 should follow the prevention steps below until a healthcare provider or local or state health department says they can return to normal activities.  Stay home except to get medical care You should restrict activities outside your home, except for getting medical care. Do not go to work, school, or public areas, and do not use public transportation or taxis.  Call ahead before visiting your doctor Before your medical appointment, call the healthcare provider and tell them that you have, or are being evaluated for, COVID-19 infection. This will help the healthcare providers office take steps to keep other people from getting infected. Ask your healthcare provider to call the local or state health department.  Monitor your symptoms Seek prompt medical attention if your illness is worsening (e.g., difficulty breathing). Before going to your medical appointment, call the healthcare provider and tell them that you have, or are being evaluated for, COVID-19 infection. Ask your healthcare provider to call the local or state health department.  Wear a facemask You should wear a facemask that covers your nose and mouth when you are in the same room with other people and when you visit a healthcare provider. People who live with or visit you should also wear a facemask while they are in the same room with you.  Separate yourself from other people in your home As much as possible, you should stay in a different room from other people in your home. Also, you should use a separate bathroom, if available.  Avoid sharing household items You should not  share dishes, drinking glasses, cups, eating utensils, towels, bedding, or other items with other people in your home. After using these items, you should wash them thoroughly with soap and water.  Cover your coughs and sneezes Cover your mouth and nose with a tissue when you cough or sneeze, or  you can cough or sneeze into your sleeve. Throw used tissues in a lined trash can, and immediately wash your hands with soap and water for at least 20 seconds or use an alcohol-based hand rub.  Wash your [facility] your hands often and thoroughly with soap and water for at least 20 seconds. You can use an alcohol-based hand sanitizer if soap and water are not available and if your hands are not visibly dirty. Avoid touching your eyes, nose, and mouth with unwashed hands.   Prevention Steps for Caregivers and Household Members of Individuals Confirmed to have, or Being Evaluated for, COVID-19 Infection Being Cared for in the Home  If you live with, or provide care at home for, a person confirmed to have, or being evaluated for, COVID-19 infection please follow these guidelines to prevent infection:  Follow healthcare providers instructions Make sure that you understand and can help the patient follow any healthcare provider instructions for all care.  Provide for the patients basic needs You should help the patient with basic needs in the home and provide support for getting groceries, prescriptions, and other personal needs.  Monitor the patients symptoms If they are getting sicker, call his or her medical provider and tell them that the patient has, or is being evaluated for, COVID-19 infection. This will help the healthcare providers office take steps to keep other people from getting infected. Ask the healthcare provider to call the local or state health department.  Limit the number of people who have contact with the patient  If possible, have only one caregiver for the patient.  Other household members should stay in another home or place of residence. If this is not possible, they should stay  in another room, or be separated from the patient as much as possible. Use a separate bathroom, if available.  Restrict visitors who do not have an essential need to be in the  home.  Keep older adults, very young children, and other sick people away from the patient Keep older adults, very young children, and those who have compromised immune systems or chronic health conditions away from the patient. This includes people with chronic heart, lung, or kidney conditions, diabetes, and cancer.  Ensure good ventilation Make sure that shared spaces in the home have good air flow, such as from an air conditioner or an opened window, weather permitting.  Wash your hands often  Wash your hands often and thoroughly with soap and water for at least 20 seconds. You can use an alcohol based hand sanitizer if soap and water are not available and if your hands are not visibly dirty.  Avoid touching your eyes, nose, and mouth with unwashed hands.  Use disposable paper towels to dry your hands. If not available, use dedicated cloth towels and replace them when they become wet.  Wear a facemask and gloves  Wear a disposable facemask at all times in the room and gloves when you touch or have contact with the patients blood, body fluids, and/or secretions or excretions, such as sweat, saliva, sputum, nasal mucus, vomit, urine, or feces.  Ensure the mask fits over your nose and mouth tightly, and do not touch it  during use.  Throw out disposable facemasks and gloves after using them. Do not reuse.  Wash your hands immediately after removing your facemask and gloves.  If your personal clothing becomes contaminated, carefully remove clothing and launder. Wash your hands after handling contaminated clothing.  Place all used disposable facemasks, gloves, and other waste in a lined container before disposing them with other household waste.  Remove gloves and wash your hands immediately after handling these items.  Do not share dishes, glasses, or other household items with the patient  Avoid sharing household items. You should not share dishes, drinking glasses, cups, eating  utensils, towels, bedding, or other items with a patient who is confirmed to have, or being evaluated for, COVID-19 infection.  After the person uses these items, you should wash them thoroughly with soap and water.  Wash laundry thoroughly  Immediately remove and wash clothes or bedding that have blood, body fluids, and/or secretions or excretions, such as sweat, saliva, sputum, nasal mucus, vomit, urine, or feces, on them.  Wear gloves when handling laundry from the patient.  Read and follow directions on labels of laundry or clothing items and detergent. In general, wash and dry with the warmest temperatures recommended on the label.  Clean all areas the individual has used often  Clean all touchable surfaces, such as counters, tabletops, doorknobs, bathroom fixtures, toilets, phones, keyboards, tablets, and bedside tables, every day. Also, clean any surfaces that may have blood, body fluids, and/or secretions or excretions on them.  Wear gloves when cleaning surfaces the patient has come in contact with.  Use a diluted bleach solution (e.g., dilute bleach with 1 part bleach and 10 parts water) or a household disinfectant with a label that says EPA-registered for coronaviruses. To make a bleach solution at home, add 1 tablespoon of bleach to 1 quart (4 cups) of water. For a larger supply, add  cup of bleach to 1 gallon (16 cups) of water.  Read labels of cleaning products and follow recommendations provided on product labels. Labels contain instructions for safe and effective use of the cleaning product including precautions you should take when applying the product, such as wearing gloves or eye protection and making sure you have good ventilation during use of the product.  Remove gloves and wash hands immediately after cleaning.  Monitor yourself for signs and symptoms of illness Caregivers and household members are considered close contacts, should monitor their health, and will be  asked to limit movement outside of the home to the extent possible. Follow the monitoring steps for close contacts listed on the symptom monitoring form.   ? If you have additional questions, contact your local health department or call the epidemiologist on call at [phone] (available 24/7). ? This guidance is subject to change. For the most up-to-date guidance from [facility], please refer to their website: [email or web address]

## 2019-09-19 NOTE — Progress Notes (Signed)
Daughter brought belongings...clothing, coat, pants, shirt, underwear, mask. Placed in paper bag labeled with name and room. RN said she would give to pt. Placed on nurses station desk.

## 2019-09-22 DIAGNOSIS — Z21 Asymptomatic human immunodeficiency virus [HIV] infection status: Secondary | ICD-10-CM | POA: Diagnosis not present

## 2019-09-22 DIAGNOSIS — Z87891 Personal history of nicotine dependence: Secondary | ICD-10-CM | POA: Diagnosis not present

## 2019-09-22 DIAGNOSIS — E860 Dehydration: Secondary | ICD-10-CM | POA: Diagnosis not present

## 2019-09-22 DIAGNOSIS — N170 Acute kidney failure with tubular necrosis: Secondary | ICD-10-CM | POA: Diagnosis not present

## 2019-09-22 DIAGNOSIS — E785 Hyperlipidemia, unspecified: Secondary | ICD-10-CM | POA: Diagnosis not present

## 2019-09-22 DIAGNOSIS — K219 Gastro-esophageal reflux disease without esophagitis: Secondary | ICD-10-CM | POA: Diagnosis not present

## 2019-09-22 DIAGNOSIS — I110 Hypertensive heart disease with heart failure: Secondary | ICD-10-CM | POA: Diagnosis not present

## 2019-09-22 DIAGNOSIS — I509 Heart failure, unspecified: Secondary | ICD-10-CM | POA: Diagnosis not present

## 2019-09-22 DIAGNOSIS — I9589 Other hypotension: Secondary | ICD-10-CM | POA: Diagnosis not present

## 2019-09-22 DIAGNOSIS — U071 COVID-19: Secondary | ICD-10-CM | POA: Diagnosis not present

## 2019-09-22 DIAGNOSIS — A0839 Other viral enteritis: Secondary | ICD-10-CM | POA: Diagnosis not present

## 2019-09-22 DIAGNOSIS — Z9181 History of falling: Secondary | ICD-10-CM | POA: Diagnosis not present

## 2019-09-22 DIAGNOSIS — E1122 Type 2 diabetes mellitus with diabetic chronic kidney disease: Secondary | ICD-10-CM | POA: Diagnosis not present

## 2019-09-22 DIAGNOSIS — J1289 Other viral pneumonia: Secondary | ICD-10-CM | POA: Diagnosis not present

## 2019-09-22 DIAGNOSIS — Z940 Kidney transplant status: Secondary | ICD-10-CM | POA: Diagnosis not present

## 2019-09-22 DIAGNOSIS — Z7982 Long term (current) use of aspirin: Secondary | ICD-10-CM | POA: Diagnosis not present

## 2019-09-22 DIAGNOSIS — Z7952 Long term (current) use of systemic steroids: Secondary | ICD-10-CM | POA: Diagnosis not present

## 2019-09-22 DIAGNOSIS — N184 Chronic kidney disease, stage 4 (severe): Secondary | ICD-10-CM | POA: Diagnosis not present

## 2019-09-22 DIAGNOSIS — B182 Chronic viral hepatitis C: Secondary | ICD-10-CM | POA: Diagnosis not present

## 2019-09-22 DIAGNOSIS — D631 Anemia in chronic kidney disease: Secondary | ICD-10-CM | POA: Diagnosis not present

## 2019-09-22 DIAGNOSIS — T451X5D Adverse effect of antineoplastic and immunosuppressive drugs, subsequent encounter: Secondary | ICD-10-CM | POA: Diagnosis not present

## 2019-09-22 DIAGNOSIS — J9601 Acute respiratory failure with hypoxia: Secondary | ICD-10-CM | POA: Diagnosis not present

## 2019-09-23 DIAGNOSIS — T451X5D Adverse effect of antineoplastic and immunosuppressive drugs, subsequent encounter: Secondary | ICD-10-CM | POA: Diagnosis not present

## 2019-09-23 DIAGNOSIS — U071 COVID-19: Secondary | ICD-10-CM | POA: Diagnosis not present

## 2019-09-23 DIAGNOSIS — J9601 Acute respiratory failure with hypoxia: Secondary | ICD-10-CM | POA: Diagnosis not present

## 2019-09-23 DIAGNOSIS — N170 Acute kidney failure with tubular necrosis: Secondary | ICD-10-CM | POA: Diagnosis not present

## 2019-09-23 DIAGNOSIS — I9589 Other hypotension: Secondary | ICD-10-CM | POA: Diagnosis not present

## 2019-09-23 DIAGNOSIS — J1289 Other viral pneumonia: Secondary | ICD-10-CM | POA: Diagnosis not present

## 2019-09-24 ENCOUNTER — Other Ambulatory Visit: Payer: Self-pay

## 2019-09-24 ENCOUNTER — Telehealth: Payer: Self-pay | Admitting: *Deleted

## 2019-09-24 ENCOUNTER — Encounter: Payer: Self-pay | Admitting: Internal Medicine

## 2019-09-24 ENCOUNTER — Ambulatory Visit (INDEPENDENT_AMBULATORY_CARE_PROVIDER_SITE_OTHER): Payer: Medicare Other | Admitting: Internal Medicine

## 2019-09-24 DIAGNOSIS — N189 Chronic kidney disease, unspecified: Secondary | ICD-10-CM

## 2019-09-24 DIAGNOSIS — U071 COVID-19: Secondary | ICD-10-CM

## 2019-09-24 DIAGNOSIS — Z7952 Long term (current) use of systemic steroids: Secondary | ICD-10-CM

## 2019-09-24 DIAGNOSIS — N185 Chronic kidney disease, stage 5: Secondary | ICD-10-CM

## 2019-09-24 DIAGNOSIS — N179 Acute kidney failure, unspecified: Secondary | ICD-10-CM | POA: Insufficient documentation

## 2019-09-24 DIAGNOSIS — R410 Disorientation, unspecified: Secondary | ICD-10-CM | POA: Diagnosis not present

## 2019-09-24 DIAGNOSIS — E1121 Type 2 diabetes mellitus with diabetic nephropathy: Secondary | ICD-10-CM

## 2019-09-24 DIAGNOSIS — J1289 Other viral pneumonia: Secondary | ICD-10-CM

## 2019-09-24 DIAGNOSIS — Z940 Kidney transplant status: Secondary | ICD-10-CM | POA: Diagnosis not present

## 2019-09-24 MED ORDER — FREESTYLE TEST VI STRP: ORAL_STRIP | 1 refills | Status: AC

## 2019-09-24 NOTE — Assessment & Plan Note (Addendum)
During recent admission for COVID-19 pneumonitis and gastro enteritis patient was noted to have AKI with Cr of 5.2, up from baseline of ~3.5. 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 She does follow with nephrology. She was treated with IVF and supportive care. Her Cr improved to 4.6 by discharge. 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   Her daughter has been giving the patient her immunosuppressive medications since discharge. She did not start all of her mothers medication's immediately as her mother refuses medications at times and she wanted to make sure her mother was getting her most important medications. The patient was restarted on her Lasix in the last couple of days after her daughter noticed increased edema. She has not yet picked up her bicarb prescription. She was advised to pick this up and have the patient take her bicarb. - Will need repeat BMP, Patient to follow up this week, tomorrow if possible - Follow up with nephrology

## 2019-09-24 NOTE — Assessment & Plan Note (Addendum)
Patient was admitted for 9 days earlier this month due to COVID-19 infection including hypoxic respiratory failure. She was treated with of Rocephin and azithromycin for possible underlying bacterial pneumonia (due immunosuppression for renal transplant) and IV steroids and Remdesivir for COVID. She improved during her admission and was discharge on home prednisone. She has been confused since discharge (see separate note for delirium). She has been breathing comfortably per daughter. She has participated with PT this week; she did desaturate to about 88% while working with them, but this improved with rest per her daughter. Patient's appetite is still significantly suppressed. - Patient is to follow up for in-person visit as soon as her and her daughter are able this week.

## 2019-09-24 NOTE — Progress Notes (Signed)
  This is a telephone encounter between [employer] daughter, Doreen Beam and Neva Seat on 09/24/2019. The visit was conducted with the patient located at home and Neva Seat at [facility]. The patient's identity was confirmed using their DOB and current address. The patient's daughter has consented to being evaluated through a telephone encounter and understands the associated risks (an examination cannot be done and the patient may need to come in for an appointment) / benefits (allows the patient to remain at home, decreasing exposure to coronavirus). I personally spent 23 minutes on medical discussion.  CC: Covid Pneumonitis, CKD, Delirium, Hospital follow up  HPI:  Ms.Haivyn A Devereux is a 73 y.o. F with PMHx listed below presenting for Covid Pneumonitis, CKD, Delirium, Hospital follow up. Please see the A&P for the status of the patient's chronic medical problems.   Past Medical History:  Diagnosis Date  . CHF (congestive heart failure) ([place])   . Chronic kidney disease    ESRD secondary to DM, started HD in 2008, Dr. Jimmy Footman is her nephrologist, received renal transplant in 2013, no longer on dialysis (06/17/2018)  . Dialysis patient [facility])    T Th Sat  . Early cataracts, bilateral   . GERD (gastroesophageal reflux disease)   . Hepatitis C    untreated. VL 3700000 in 2008; pt reports this has been treated" (06/17/2018)  . High cholesterol   . History of blood transfusion 2013   "when I got the kidney" (06/17/2018)  . History of hepatitis C 08/04/2015  . HIV infection ([place]) 1998   Dx in 1998 in [place], she presented with PCP pneumonia at that time. Has been tried on multiple regimens by her PCP in [place] before./ She has been on current ART for years now. Moved to [facility] in 2008 and is following with Dr. Tommy Medal since then.   . Hypertension   . Influenza-like illness 05/27/2019  . 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 PONV (postoperative nausea and vomiting)   . Type II  diabetes mellitus ([place])   . Wears glasses    Review of Systems:  Performed and all others negative.  Physical Exam:   There were no vitals filed for this visit. Not performed for tele-health vist  Assessment & Plan:   See Encounters Tab for problem based charting.  Patient discussed with Dr. Lynnae January

## 2019-09-24 NOTE — Assessment & Plan Note (Signed)
Patient's daughter reports she has been confused in a waxing and waning manner during the last day or two of her admission and ever since returning home. She has remained alert and oriented per her daughter (unclear if oriented x2 or x3). She is able to answer yes or no questions and some other questions, but she will have difficulty holding a conversation.  This is significantly different from the patient's baseline. Will have patient come in for lab work and further evaluation due to concern for medical sources of delirium beyond prolonged hospitalization in an elderly patient with decreased PO intake. She has been slowly improving since discharge per her daughter, but she has not been taking all of her medications (due to her daughter easing them in as her mother has a history of intermittent adherence) and she is at risk for worsening renal failure given recent Acute on Chronic renal failure. - In person follow up ASAP - BMP and consider other lab work at follow up

## 2019-09-24 NOTE — Telephone Encounter (Signed)
Call from pt's daughter -stated she has just talked to Dr Trilby Drummer about her mother per telehealth call and was informed to schedule an appt for her mother. Pt was tested + for covid 109 on 10/9. Daughter denied pt having cp, cough,fever; stated pt has no desire to eat (not new) and no sob except walking long distances. Appt given for Friday 10/23 in [facility].

## 2019-09-25 ENCOUNTER — Encounter: Payer: Self-pay | Admitting: Internal Medicine

## 2019-09-25 ENCOUNTER — Inpatient Hospital Stay (HOSPITAL_COMMUNITY)
Admission: EM | Admit: 2019-09-25 | Discharge: 2019-11-04 | DRG: 974 | Disposition: E | Payer: Medicare Other | Source: Ambulatory Visit | Attending: Internal Medicine | Admitting: Internal Medicine

## 2019-09-25 ENCOUNTER — Ambulatory Visit (INDEPENDENT_AMBULATORY_CARE_PROVIDER_SITE_OTHER): Payer: Medicare Other | Admitting: Internal Medicine

## 2019-09-25 ENCOUNTER — Other Ambulatory Visit: Payer: Self-pay

## 2019-09-25 ENCOUNTER — Emergency Department (HOSPITAL_COMMUNITY): Payer: Medicare Other

## 2019-09-25 DIAGNOSIS — R54 Age-related physical debility: Secondary | ICD-10-CM | POA: Diagnosis present

## 2019-09-25 DIAGNOSIS — Z79899 Other long term (current) drug therapy: Secondary | ICD-10-CM

## 2019-09-25 DIAGNOSIS — J159 Unspecified bacterial pneumonia: Secondary | ICD-10-CM | POA: Diagnosis present

## 2019-09-25 DIAGNOSIS — Z515 Encounter for palliative care: Secondary | ICD-10-CM | POA: Diagnosis not present

## 2019-09-25 DIAGNOSIS — Z833 Family history of diabetes mellitus: Secondary | ICD-10-CM

## 2019-09-25 DIAGNOSIS — R579 Shock, unspecified: Secondary | ICD-10-CM

## 2019-09-25 DIAGNOSIS — I10 Essential (primary) hypertension: Secondary | ICD-10-CM | POA: Diagnosis not present

## 2019-09-25 DIAGNOSIS — G253 Myoclonus: Secondary | ICD-10-CM | POA: Diagnosis present

## 2019-09-25 DIAGNOSIS — B20 Human immunodeficiency virus [HIV] disease: Secondary | ICD-10-CM

## 2019-09-25 DIAGNOSIS — R627 Adult failure to thrive: Secondary | ICD-10-CM | POA: Diagnosis present

## 2019-09-25 DIAGNOSIS — R4182 Altered mental status, unspecified: Secondary | ICD-10-CM | POA: Diagnosis not present

## 2019-09-25 DIAGNOSIS — E1110 Type 2 diabetes mellitus with ketoacidosis without coma: Secondary | ICD-10-CM | POA: Diagnosis present

## 2019-09-25 DIAGNOSIS — E785 Hyperlipidemia, unspecified: Secondary | ICD-10-CM | POA: Diagnosis not present

## 2019-09-25 DIAGNOSIS — J9601 Acute respiratory failure with hypoxia: Secondary | ICD-10-CM | POA: Diagnosis present

## 2019-09-25 DIAGNOSIS — E860 Dehydration: Secondary | ICD-10-CM | POA: Diagnosis present

## 2019-09-25 DIAGNOSIS — N189 Chronic kidney disease, unspecified: Secondary | ICD-10-CM

## 2019-09-25 DIAGNOSIS — I82621 Acute embolism and thrombosis of deep veins of right upper extremity: Secondary | ICD-10-CM | POA: Diagnosis not present

## 2019-09-25 DIAGNOSIS — E1122 Type 2 diabetes mellitus with diabetic chronic kidney disease: Secondary | ICD-10-CM | POA: Diagnosis present

## 2019-09-25 DIAGNOSIS — E870 Hyperosmolality and hypernatremia: Secondary | ICD-10-CM | POA: Diagnosis present

## 2019-09-25 DIAGNOSIS — E43 Unspecified severe protein-calorie malnutrition: Secondary | ICD-10-CM | POA: Diagnosis present

## 2019-09-25 DIAGNOSIS — S0990XA Unspecified injury of head, initial encounter: Secondary | ICD-10-CM | POA: Diagnosis not present

## 2019-09-25 DIAGNOSIS — G9340 Encephalopathy, unspecified: Secondary | ICD-10-CM

## 2019-09-25 DIAGNOSIS — E876 Hypokalemia: Secondary | ICD-10-CM | POA: Diagnosis not present

## 2019-09-25 DIAGNOSIS — J1289 Other viral pneumonia: Secondary | ICD-10-CM | POA: Diagnosis not present

## 2019-09-25 DIAGNOSIS — B182 Chronic viral hepatitis C: Secondary | ICD-10-CM

## 2019-09-25 DIAGNOSIS — Z940 Kidney transplant status: Secondary | ICD-10-CM

## 2019-09-25 DIAGNOSIS — Z87891 Personal history of nicotine dependence: Secondary | ICD-10-CM

## 2019-09-25 DIAGNOSIS — D631 Anemia in chronic kidney disease: Secondary | ICD-10-CM | POA: Diagnosis present

## 2019-09-25 DIAGNOSIS — N179 Acute kidney failure, unspecified: Secondary | ICD-10-CM | POA: Diagnosis present

## 2019-09-25 DIAGNOSIS — I129 Hypertensive chronic kidney disease with stage 1 through stage 4 chronic kidney disease, or unspecified chronic kidney disease: Secondary | ICD-10-CM | POA: Diagnosis present

## 2019-09-25 DIAGNOSIS — Z01818 Encounter for other preprocedural examination: Secondary | ICD-10-CM

## 2019-09-25 DIAGNOSIS — U071 COVID-19: Secondary | ICD-10-CM | POA: Diagnosis not present

## 2019-09-25 DIAGNOSIS — Y830 Surgical operation with transplant of whole organ as the cause of abnormal reaction of the patient, or of later complication, without mention of misadventure at the time of the procedure: Secondary | ICD-10-CM | POA: Diagnosis present

## 2019-09-25 DIAGNOSIS — Z66 Do not resuscitate: Secondary | ICD-10-CM | POA: Diagnosis not present

## 2019-09-25 DIAGNOSIS — J1282 Pneumonia due to coronavirus disease 2019: Secondary | ICD-10-CM | POA: Diagnosis present

## 2019-09-25 DIAGNOSIS — R1310 Dysphagia, unspecified: Secondary | ICD-10-CM | POA: Diagnosis present

## 2019-09-25 DIAGNOSIS — Z4659 Encounter for fitting and adjustment of other gastrointestinal appliance and device: Secondary | ICD-10-CM

## 2019-09-25 DIAGNOSIS — I749 Embolism and thrombosis of unspecified artery: Secondary | ICD-10-CM

## 2019-09-25 DIAGNOSIS — K219 Gastro-esophageal reflux disease without esophagitis: Secondary | ICD-10-CM | POA: Diagnosis not present

## 2019-09-25 DIAGNOSIS — G9341 Metabolic encephalopathy: Secondary | ICD-10-CM | POA: Diagnosis present

## 2019-09-25 DIAGNOSIS — D6959 Other secondary thrombocytopenia: Secondary | ICD-10-CM | POA: Diagnosis present

## 2019-09-25 DIAGNOSIS — E1169 Type 2 diabetes mellitus with other specified complication: Secondary | ICD-10-CM | POA: Diagnosis not present

## 2019-09-25 DIAGNOSIS — R5381 Other malaise: Secondary | ICD-10-CM | POA: Diagnosis not present

## 2019-09-25 DIAGNOSIS — Z6825 Body mass index (BMI) 25.0-25.9, adult: Secondary | ICD-10-CM

## 2019-09-25 DIAGNOSIS — N185 Chronic kidney disease, stage 5: Secondary | ICD-10-CM

## 2019-09-25 DIAGNOSIS — T8619 Other complication of kidney transplant: Secondary | ICD-10-CM | POA: Diagnosis present

## 2019-09-25 DIAGNOSIS — R410 Disorientation, unspecified: Secondary | ICD-10-CM

## 2019-09-25 DIAGNOSIS — Z9289 Personal history of other medical treatment: Secondary | ICD-10-CM

## 2019-09-25 DIAGNOSIS — J17 Pneumonia in diseases classified elsewhere: Secondary | ICD-10-CM | POA: Diagnosis not present

## 2019-09-25 DIAGNOSIS — M79A11 Nontraumatic compartment syndrome of right upper extremity: Secondary | ICD-10-CM | POA: Diagnosis not present

## 2019-09-25 DIAGNOSIS — R600 Localized edema: Secondary | ICD-10-CM | POA: Diagnosis not present

## 2019-09-25 DIAGNOSIS — N184 Chronic kidney disease, stage 4 (severe): Secondary | ICD-10-CM | POA: Diagnosis present

## 2019-09-25 DIAGNOSIS — Z794 Long term (current) use of insulin: Secondary | ICD-10-CM

## 2019-09-25 DIAGNOSIS — R570 Cardiogenic shock: Secondary | ICD-10-CM | POA: Diagnosis present

## 2019-09-25 LAB — CBC WITH DIFFERENTIAL/PLATELET
Abs Immature Granulocytes: 0.04 10*3/uL (ref 0.00–0.07)
Basophils Absolute: 0 10*3/uL (ref 0.0–0.1)
Basophils Relative: 0 %
Eosinophils Absolute: 0.1 10*3/uL (ref 0.0–0.5)
Eosinophils Relative: 1 %
HCT: 31.6 % — ABNORMAL LOW (ref 36.0–46.0)
Hemoglobin: 9.9 g/dL — ABNORMAL LOW (ref 12.0–15.0)
Immature Granulocytes: 1 %
Lymphocytes Relative: 6 %
Lymphs Abs: 0.4 10*3/uL — ABNORMAL LOW (ref 0.7–4.0)
MCH: 28.9 pg (ref 26.0–34.0)
MCHC: 31.3 g/dL (ref 30.0–36.0)
MCV: 92.1 fL (ref 80.0–100.0)
Monocytes Absolute: 0.6 10*3/uL (ref 0.1–1.0)
Monocytes Relative: 9 %
Neutro Abs: 5.4 10*3/uL (ref 1.7–7.7)
Neutrophils Relative %: 83 %
Platelets: 162 10*3/uL (ref 150–400)
RBC: 3.43 MIL/uL — ABNORMAL LOW (ref 3.87–5.11)
RDW: 15 % (ref 11.5–15.5)
WBC: 6.5 10*3/uL (ref 4.0–10.5)
nRBC: 0 % (ref 0.0–0.2)

## 2019-09-25 LAB — COMPREHENSIVE METABOLIC PANEL WITH GFR
ALT: 14 U/L (ref 0–44)
AST: 14 U/L — ABNORMAL LOW (ref 15–41)
Albumin: 2.3 g/dL — ABNORMAL LOW (ref 3.5–5.0)
Alkaline Phosphatase: 57 U/L (ref 38–126)
Anion gap: 15 (ref 5–15)
BUN: 39 mg/dL — ABNORMAL HIGH (ref 8–23)
CO2: 14 mmol/L — ABNORMAL LOW (ref 22–32)
Calcium: 9.4 mg/dL (ref 8.9–10.3)
Chloride: 120 mmol/L — ABNORMAL HIGH (ref 98–111)
Creatinine, Ser: 3.05 mg/dL — ABNORMAL HIGH (ref 0.44–1.00)
GFR calc Af Amer: 17 mL/min — ABNORMAL LOW
GFR calc non Af Amer: 15 mL/min — ABNORMAL LOW
Glucose, Bld: 346 mg/dL — ABNORMAL HIGH (ref 70–99)
Potassium: 4.3 mmol/L (ref 3.5–5.1)
Sodium: 149 mmol/L — ABNORMAL HIGH (ref 135–145)
Total Bilirubin: 1 mg/dL (ref 0.3–1.2)
Total Protein: 5.8 g/dL — ABNORMAL LOW (ref 6.5–8.1)

## 2019-09-25 LAB — CBG MONITORING, ED: Glucose-Capillary: 319 mg/dL — ABNORMAL HIGH (ref 70–99)

## 2019-09-25 LAB — LACTIC ACID, PLASMA: Lactic Acid, Venous: 1.9 mmol/L (ref 0.5–1.9)

## 2019-09-25 MED ORDER — HYDRALAZINE HCL 25 MG PO TABS
25.0000 mg | ORAL_TABLET | Freq: Once | ORAL | Status: AC
  Administered 2019-09-25: 25 mg via ORAL
  Filled 2019-09-25: qty 1

## 2019-09-25 NOTE — ED Notes (Signed)
X-ray at bedside

## 2019-09-25 NOTE — ED Provider Notes (Signed)
  Physical Exam  BP (!) 223/60   Pulse 66   Temp 97.8 F (36.6 C) (Oral)   Resp (!) 31   Ht 5\' 3"  (1.6 m)   Wt 65.8 kg   SpO2 98%   BMI 25.69 kg/m   Physical Exam  Physical exam and history as documented in previous providers note.  No significant changes noted on my exam.  Patient's blood pressure was being taken by her forearm and had an elevated systolic blood pressure with a low diastolic pressure.  This was manipulated and more consistent blood pressures were obtained.   ED Course/Procedures     Procedures  MDM  At the time I assumed care for this patient she is recently tested positive for COVID-19 and was hospitalized for same.  Labs and imaging pending at the time of transition of care.  Labs demonstrated mild hypernatremia at 149, glucose elevated at 346, creatinine elevated at 3.05, anion gap not elevated.  Lactic acid 1.9.  Patient is tachypneic in room air although her oxygen saturation is 98 to 100%.  She has belly breathing and mild intercostal retractions therefore plan to admit to inpatient service.       Romona Curls, MD 09/26/19 Ovid Curd    Gareth Morgan, MD 09/27/19 1212

## 2019-09-25 NOTE — ED Triage Notes (Signed)
Per daughter, pt released Friday from hospital for 7-day [place] admission (diagnosed on 10/9), status improving, began deteriorating last night.

## 2019-09-25 NOTE — ED Notes (Addendum)
This RN called CT to inquire about head CT, was told they are trying to plan scan around other Covid positive pt to decrease shut down times of CT rooms.

## 2019-09-25 NOTE — ED Notes (Signed)
Daughter at bedside. Pt resting comfortably, will continue to monitor

## 2019-09-25 NOTE — ED Provider Notes (Signed)
[facility] EMERGENCY DEPARTMENT Provider Note   CSN: [id number] Arrival date & time: 09/07/2019  1401     History   Chief Complaint Chief Complaint  Patient presents with  . Altered Mental Status  . Shortness of Breath  . Nausea    HPI [name] is a 73 y.o. female.     HPI  Patient has a past medical history of recent Covid positive result and 9-day hospital admission presenting today for worsening mental status.  Patient's daughter is in the room, and history is primarily obtained from her.  Patient's daughter reports that patient was discharged on a decreased mental status than her usual baseline (normal talking and walking, lives alone).  Reportedly, patient left the hospital saying yes or no, one-word answers.  He seemed to have improved by Monday and was talking in sentences, but then she worsened until from Monday.  She reportedly has been more confused than ever.  They were seen at internal medicine clinic earlier today, where providers were concerned for possible AKI versus hyperglycemia due to patient not taking her insulin and diabetic medications over the past week.  No medications were tried prior to arrival than usual prescribed medications.  Denies any fevers, chills, sweats, new cough.  Nothing seems to make symptoms better or worse.  Past Medical History:  Diagnosis Date  . CHF (congestive heart failure) ([place])   . Chronic kidney disease    ESRD secondary to DM, started HD in 2008, Dr. Jimmy Footman is her nephrologist, received renal transplant in 2013, no longer on dialysis (06/17/2018)  . Dialysis patient [facility])    T Th Sat  . Early cataracts, bilateral   . GERD (gastroesophageal reflux disease)   . Hepatitis C    untreated. VL [id number] in 2008; pt reports this has been treated" (06/17/2018)  . High cholesterol   . History of blood transfusion 2013   "when I got the kidney" (06/17/2018)  . History of hepatitis C 08/04/2015  . HIV infection  ([place]) 1998   Dx in 1998 in [place], she presented with PCP pneumonia at that time. Has been tried on multiple regimens by her PCP in [place] before./ She has been on current ART for years now. Moved to [facility] in 2008 and is following with Dr. Tommy Medal since then.   . Hypertension   . Influenza-like illness 05/27/2019  . 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 PONV (postoperative nausea and vomiting)   . Type II diabetes mellitus ([place])   . Wears glasses     Patient Active Problem List   Diagnosis Date Noted  . Acute renal failure superimposed on chronic kidney disease ([place]) 09/24/2019  . Delirium 09/24/2019  . COVID-19 virus infection 09/13/2019  . Influenza-like illness 05/27/2019  . Fever 05/08/2019  . AIDS (acquired immune deficiency syndrome) ([place]) 07/19/2018  . History of hepatitis C 08/04/2015  . Kidney transplant recipient 10/02/2014  . Tingling in extremities 06/23/2014  . Systemic lupus erythematosus (SLE) inhibitor ([place]) 09/24/2012  . Seasonal allergic rhinitis 09/20/2012  . Health care maintenance 09/20/2012  . GERD (gastroesophageal reflux disease) 08/27/2012  . Hyperlipidemia 08/27/2012  . Hearing loss of both ears 06/26/2012  . Nonspecific abnormal cardiovascular system function study 06/07/2012  . Anemia 05/22/2012  . ACQUIRED ABSENCE OF BOTH CERVIX AND UTERUS 09/21/2010  . UNSPECIFIED SYPHILIS 05/06/2008  . HEPATITIS C 11/12/2007  . Essential hypertension, benign 11/12/2007  . DM2 (diabetes mellitus, type 2) ([place]) 12/04/1993  Past Surgical History:  Procedure Laterality Date  . ABDOMINAL HYSTERECTOMY     and oopherectomy for fibroids  . BASCILIC VEIN TRANSPOSITION Left 01/29/2018   Procedure: RIGHT BRACIOCEPHALIC AV FISTULA;  Surgeon: Angelia Mould, MD;  Location: [place];  Service: Vascular;  Laterality: Left;  . CHOLECYSTECTOMY OPEN    . KIDNEY TRANSPLANT  2013     OB History   No obstetric history on file.      Home Medications    Prior to  Admission medications   Medication Sig Start Date End Date Taking? Authorizing Provider  amLODipine (NORVASC) 10 MG tablet TAKE 1 TABLET(10 MG) BY MOUTH DAILY Patient taking differently: Take 10 mg by mouth daily.  03/24/19  Yes Annia Belt, MD  aspirin 81 MG EC tablet Take 81 mg by mouth daily.     Yes [provider]  calcitRIOL (ROCALTROL) 0.25 MCG capsule Take 0.25 mcg by mouth daily. 05/06/18  Yes [provider]  carvedilol (COREG) 25 MG tablet Take 1 tablet (25 mg total) by mouth 2 (two) times daily with a meal. 07/17/19  Yes Oda Kilts, MD  cinacalcet (SENSIPAR) 60 MG tablet Take 60 mg by mouth daily.   Yes [provider]  dolutegravir (TIVICAY) 50 MG tablet Take 1 tablet (50 mg total) by mouth daily. 11/05/18  Yes Tommy Medal, Lavell Islam, MD  FREESTYLE TEST STRIPS test strip Use to check blood sugar 4 times daily. diag code E11.9. insulin dependent 09/24/19  Yes Neva Seat, MD  furosemide (LASIX) 80 MG tablet Take 1 tablet (80 mg total) by mouth daily. 09/20/19  Yes Thurnell Lose, MD  Insulin Degludec-Liraglutide (XULTOPHY) 100-3.6 UNIT-MG/ML SOPN Inject 16 Units into the skin every morning. 07/17/19  Yes Oda Kilts, MD  Insulin Pen Needle 32G X 4 MM MISC 16 Units by Does not apply route daily. 05/17/18  Yes Lorella Nimrod, MD  lamivudine (EPIVIR) 100 MG tablet Take 1 tablet (100 mg total) by mouth daily. 11/05/18  Yes Tommy Medal, Lavell Islam, MD  Lancets (FREESTYLE) lancets Use as instructed 05/17/18  Yes Lorella Nimrod, MD  loperamide (IMODIUM A-D) 2 MG tablet Take 1 tablet (2 mg total) by mouth 3 (three) times daily as needed for diarrhea or loose stools. 09/19/19  Yes Thurnell Lose, MD  magnesium oxide (MAG-OX) 400 MG tablet Take 400 mg by mouth daily.   Yes [provider]  mycophenolate (MYFORTIC) 180 MG EC tablet Take 540 mg by mouth 2 (two) times daily.   Yes [provider]  ondansetron (ZOFRAN) 4 MG tablet  Take 1 tablet (4 mg total) by mouth every 8 (eight) hours as needed for nausea or vomiting. 09/19/19  Yes Thurnell Lose, MD  pantoprazole (PROTONIX) 20 MG tablet Take 1 tablet (20 mg total) by mouth daily. 07/17/19  Yes Oda Kilts, MD  potassium chloride SA (K-DUR,KLOR-CON) 20 MEQ tablet Take 20 mEq by mouth daily. 05/13/18  Yes [provider]  predniSONE (DELTASONE) 5 MG tablet Take 5 mg by mouth daily with breakfast.   Yes [provider]  rosuvastatin (CRESTOR) 20 MG tablet TAKE 1 TABLET(20 MG) BY MOUTH DAILY Patient taking differently: Take 20 mg by mouth daily.  07/30/19  Yes Sid Falcon, MD  sodium bicarbonate 650 MG tablet Take 1 tablet (650 mg total) by mouth 3 (three) times daily. 09/19/19  Yes Thurnell Lose, MD  tacrolimus (PROGRAF) 1 MG capsule Take 2-3 mg by mouth See  admin instructions. Take 2 mg by mouth in the morning, then take 3 mg by mouth in the afternoon 10/01/13  Yes [provider]  VOLTAREN 1 % GEL APPLY 2 GRAMS EXTERNALLY TO THE AFFECTED AREA FOUR TIMES DAILY Patient taking differently: Apply 2 g topically daily as needed. For pain 04/16/19  Yes Lorella Nimrod, MD    Family History Family History  Problem Relation Age of Onset  . Diabetes Mother   . Cancer Father   . Cancer Brother     Social History Social History   Tobacco Use  . Smoking status: Former Smoker    Packs/day: 0.12    Years: 33.00    Pack years: 3.96    Types: Cigarettes    Quit date: 05/22/1997    Years since quitting: 22.3  . Smokeless tobacco: Never Used  Substance Use Topics  . Alcohol use: Not Currently    Alcohol/week: 0.0 standard drinks  . Drug use: Never     Allergies   Omeprazole, Eggs or egg-derived products, and Influenza vaccines   Review of Systems Review of Systems  Unable to perform ROS: Acuity of condition  Constitutional: Negative for fever.  Cardiovascular: Negative for chest pain.  Gastrointestinal: Positive for  nausea. Negative for abdominal pain and vomiting.  Skin: Negative for wound.  Neurological: Negative for syncope.  All other systems reviewed and are negative.    Physical Exam Updated Vital Signs BP (!) 205/80   Pulse 79   Temp 97.8 F (36.6 C) (Oral)   Resp (!) 40   Ht 5\' 3"  (1.6 m)   Wt 65.8 kg   SpO2 97%   BMI 25.69 kg/m   Physical Exam Vitals signs and nursing note reviewed.  Constitutional:      Appearance: She is well-developed. She is not diaphoretic.  HENT:     Head: Normocephalic and atraumatic.  Eyes:     Extraocular Movements: Extraocular movements intact.     Conjunctiva/sclera: Conjunctivae normal.  Neck:     Musculoskeletal: Neck supple.  Cardiovascular:     Rate and Rhythm: Normal rate and regular rhythm.     Heart sounds: No murmur.  Pulmonary:     Effort: Pulmonary effort is normal. No respiratory distress.     Breath sounds: Normal breath sounds.  Chest:     Chest wall: No tenderness.  Abdominal:     Palpations: Abdomen is soft.     Tenderness: There is no abdominal tenderness.     Comments: No peritoneal signs of the abdomen  Musculoskeletal:     Right lower leg: Edema present.     Left lower leg: Edema present.     Comments: Mild 1+ edema in bilateral lower extremities.  Skin:    General: Skin is warm and dry.  Neurological:     Motor: Weakness (generalized global weakness) present.     Comments: Patient gives yes or no answers, but cannot speak in sentences.      ED Treatments / Results  Labs (all labs ordered are listed, but only abnormal results are displayed) Labs Reviewed  CBG MONITORING, ED - Abnormal; Notable for the following components:      Result Value   Glucose-Capillary 319 (*)    All other components within normal limits  CBC WITH DIFFERENTIAL/PLATELET  COMPREHENSIVE METABOLIC PANEL  LACTIC ACID, PLASMA  LACTIC ACID, PLASMA  URINALYSIS, ROUTINE W REFLEX MICROSCOPIC  I-STAT VENOUS BLOOD GAS, ED    EKG None   Radiology Dg Chest  Portable 1 View  Result Date: 10/03/2019 CLINICAL DATA:  Recent change in mental status. Known COVID positive. EXAM: PORTABLE CHEST 1 VIEW COMPARISON:  09/12/2019 FINDINGS: Worsening of bilateral opacities with multifocal areas of nodular opacity and peripheral changes. Also with left lower lobe consolidation since prior study. Cardiomediastinal contours are enlarged. No acute bone process. IMPRESSION: Worsening of multifocal pneumonia in the setting of known COVID infection. No other interval changes. Electronically Signed   By: Zetta Bills M.D.   On: 09/12/2019 15:10    Procedures Procedures (including critical care time)  Medications Ordered in ED Medications - No data to display   Initial Impression / Assessment and Plan / ED Course  I have reviewed the triage vital signs and the nursing notes.  Pertinent labs & imaging results that were available during my care of the patient were reviewed by me and considered in my medical decision making (see chart for details).        TRAMEKA KOOI is a 73 y.o. female with a past medical history of recent Covid positive result and 9-day hospital admission presents today for worsening mental status. Patient arrives hemodynamically stable, slightly hypertensive, afebrile, on room air, mildly tachypneic.  Differential diagnosis at this time includes worsening pneumonia, sepsis, electrolyte abnormality, hyperglycemia versus DKA, AKI.  Labs ordered, chest x-ray ordered.  Chest x-ray showed worsening pneumonia.  In setting of known Covid positive, will not give antibiotics at this time.  Anion gap was 12, potassium is 4.3. Doubt DKA. Handoff given to the next provider.  Briefly, remaining labs to be sent for likely admission.  Final Clinical Impressions(s) / ED Diagnoses   Final diagnoses:  Altered mental status, unspecified altered mental status type  COVID-19    ED Discharge Orders    None       Julianne Rice,  MD 09/28/2019 1625    Dorie Rank, MD 09/26/19 [phone]

## 2019-09-25 NOTE — ED Notes (Signed)
Patient transported to CT 

## 2019-09-25 NOTE — ED Notes (Signed)
This nurse went in to put BP on pt's leg to check pressure to administer hydralazine. This RN moved the sheet to place cuff and noticed pt's sheet was saturated with urine. This RN then requested the help of Jeri NT to change the sheet and chuck pad underneath her and re-position PureWick. This RN then checked BP and administered hydralazine per MD orders

## 2019-09-25 NOTE — H&P (Addendum)
History and Physical    [name] [id number] DOB: [birth year] DOA: 09/18/2019  PCP: Earlene Plater, MD  Patient coming from: Home, daughter at bedside  I have personally briefly reviewed patient's old medical records in [place]  Chief Complaint: AMS, vomiting  HPI: [name] is a 73 y.o. 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 Patient was discharged home 10/16 last week from [facility] for COVID-19 viral gastroenteritis, AKI on CKD, and pneumonitis with acute hypoxic respiratory failure.  She was treated with a course of Rocephin, azithromycin as well as IV steroids and remdesivir.  Daughter at bedside reports that patient initially improved some since returning home.  However patient continued to have generalized weakness and unable to communicate more than a sentence each time which is unlike her baseline. Increase somnolent.  Also continues have decreased appetite but able to stay hydrated.  Then last night she began to have nausea and vomiting and daughter also noticed more labored breathing.   ED Course: She was afebrile and hypertensive at times up to [id number] systolic.  CBC showed no leukocytosis and stable hemoglobin of 9.9.  CMP showed elevated sodium of 149, glucose of 346, creatinine of 3.05 from 4.606 days ago.  Mildly elevated AST of 14.  Lactic acid of 1.2.  CT head negative for acute finding.  Chest x-ray showed worsening of multifocal pneumonia in the setting of known Covid infection.  Review of Systems:  Unable to fully obtain due to patient's altered mental status but she denies any frank pain.    Past Medical History:  Diagnosis Date  . CHF  (congestive heart failure) ([place])   . Chronic kidney disease    ESRD secondary to DM, started HD in 2008, Dr. Jimmy Footman is her nephrologist, received renal transplant in 2013, no longer on dialysis (06/17/2018)  . Dialysis patient [facility])    T Th Sat  . Early cataracts, bilateral   . GERD (gastroesophageal reflux disease)   . Hepatitis C    untreated. VL [id number] in 2008; pt reports this has been treated" (06/17/2018)  . High cholesterol   . History of blood transfusion 2013   "when I got the kidney" (06/17/2018)  . History of hepatitis C 08/04/2015  . HIV infection ([place]) 1998   Dx in 1998 in [place], she presented with PCP pneumonia at that time. Has been tried on multiple regimens by her PCP in [place] before./ She has been on current ART for years now. Moved to [facility] in 2008 and is following with Dr. Tommy Medal since then.   . Hypertension   . Influenza-like illness 05/27/2019  . 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 PONV (postoperative nausea and vomiting)   . Type II diabetes mellitus ([place])   . Wears glasses     Past Surgical History:  Procedure Laterality Date  . ABDOMINAL HYSTERECTOMY     and oopherectomy for fibroids  . BASCILIC VEIN TRANSPOSITION Left 01/29/2018   Procedure: RIGHT BRACIOCEPHALIC AV FISTULA;  Surgeon: Angelia Mould, MD;  Location: [place];  Service: Vascular;  Laterality: Left;  . CHOLECYSTECTOMY OPEN    . KIDNEY TRANSPLANT  2013     reports that she quit smoking about 22 years ago. Her smoking  use included cigarettes. She has a 3.96 pack-year smoking history. She has never used smokeless tobacco. She reports previous alcohol use. She reports that she does not use drugs.  Allergies  Allergen Reactions  . Omeprazole Other (See Comments)    Interferes with the absorption of rilipivirine  . Eggs Or Egg-Derived Products Nausea And Vomiting  . Influenza Vaccines Other (See Comments)    Hallucination    Family History  Problem Relation Age of Onset  .  Diabetes Mother   . Cancer Father   . Cancer Brother    Family history reviewed and not pertinent   Prior to Admission medications   Medication Sig Start Date End Date Taking? Authorizing Provider  amLODipine (NORVASC) 10 MG tablet TAKE 1 TABLET(10 MG) BY MOUTH DAILY Patient taking differently: Take 10 mg by mouth daily.  03/24/19  Yes Annia Belt, MD  aspirin 81 MG EC tablet Take 81 mg by mouth daily.     Yes [provider]  calcitRIOL (ROCALTROL) 0.25 MCG capsule Take 0.25 mcg by mouth daily. 05/06/18  Yes [provider]  carvedilol (COREG) 25 MG tablet Take 1 tablet (25 mg total) by mouth 2 (two) times daily with a meal. 07/17/19  Yes Oda Kilts, MD  cinacalcet (SENSIPAR) 60 MG tablet Take 60 mg by mouth daily.   Yes [provider]  dolutegravir (TIVICAY) 50 MG tablet Take 1 tablet (50 mg total) by mouth daily. 11/05/18  Yes Tommy Medal, Lavell Islam, MD  FREESTYLE TEST STRIPS test strip Use to check blood sugar 4 times daily. diag code E11.9. insulin dependent 09/24/19  Yes Neva Seat, MD  furosemide (LASIX) 80 MG tablet Take 1 tablet (80 mg total) by mouth daily. 09/20/19  Yes Thurnell Lose, MD  Insulin Degludec-Liraglutide (XULTOPHY) 100-3.6 UNIT-MG/ML SOPN Inject 16 Units into the skin every morning. 07/17/19  Yes Oda Kilts, MD  Insulin Pen Needle 32G X 4 MM MISC 16 Units by Does not apply route daily. 05/17/18  Yes Lorella Nimrod, MD  lamivudine (EPIVIR) 100 MG tablet Take 1 tablet (100 mg total) by mouth daily. 11/05/18  Yes Tommy Medal, Lavell Islam, MD  Lancets (FREESTYLE) lancets Use as instructed 05/17/18  Yes Lorella Nimrod, MD  loperamide (IMODIUM A-D) 2 MG tablet Take 1 tablet (2 mg total) by mouth 3 (three) times daily as needed for diarrhea or loose stools. 09/19/19  Yes Thurnell Lose, MD  magnesium oxide (MAG-OX) 400 MG tablet Take 400 mg by mouth daily.   Yes [provider]  mycophenolate (MYFORTIC) 180 MG EC  tablet Take 540 mg by mouth 2 (two) times daily.   Yes [provider]  ondansetron (ZOFRAN) 4 MG tablet Take 1 tablet (4 mg total) by mouth every 8 (eight) hours as needed for nausea or vomiting. 09/19/19  Yes Thurnell Lose, MD  pantoprazole (PROTONIX) 20 MG tablet Take 1 tablet (20 mg total) by mouth daily. 07/17/19  Yes Oda Kilts, MD  potassium chloride SA (K-DUR,KLOR-CON) 20 MEQ tablet Take 20 mEq by mouth daily. 05/13/18  Yes [provider]  predniSONE (DELTASONE) 5 MG tablet Take 5 mg by mouth daily with breakfast.   Yes [provider]  rosuvastatin (CRESTOR) 20 MG tablet TAKE 1 TABLET(20 MG) BY MOUTH DAILY Patient taking differently: Take 20 mg by mouth daily.  07/30/19  Yes Sid Falcon, MD  sodium bicarbonate 650 MG tablet Take 1 tablet (650 mg total) by mouth 3 (three)  times daily. 09/19/19  Yes Thurnell Lose, MD  tacrolimus (PROGRAF) 1 MG capsule Take 2-3 mg by mouth See admin instructions. Take 2 mg by mouth in the morning, then take 3 mg by mouth in the afternoon 10/01/13  Yes [provider]  VOLTAREN 1 % GEL APPLY 2 GRAMS EXTERNALLY TO THE AFFECTED AREA FOUR TIMES DAILY Patient taking differently: Apply 2 g topically daily as needed. For pain 04/16/19  Ezekiel Slocumb, MD    Physical Exam: Vitals:   09/20/2019 2215 09/14/2019 2245 09/06/2019 2315 09/24/2019 2330  BP: (!) 170/43 (!) 156/51 (!) 160/46 (!) 141/47  Pulse: 72 72 72 69  Resp: (!) 28 (!) 32 (!) 33 (!) 32  Temp:      TempSrc:      SpO2: 100% 100% 100% 100%  Weight:      Height:        Constitutional: NAD, calm, comfortable, thin cachectic appearing female laying flat in bed Vitals:   09/21/2019 2215 09/15/2019 2245 09/15/2019 2315 09/10/2019 2330  BP: (!) 170/43 (!) 156/51 (!) 160/46 (!) 141/47  Pulse: 72 72 72 69  Resp: (!) 28 (!) 32 (!) 33 (!) 32  Temp:      TempSrc:      SpO2: 100% 100% 100% 100%  Weight:      Height:       Eyes: PERRL, lids and conjunctivae  normal ENMT: Mucous membranes are moist. Posterior pharynx clear of any exudate or lesions. Neck: normal, supple, no masses,  Respiratory: clear to auscultation bilaterally, no wheezing, no crackles. Normal respiratory effort.  100% oxygen saturation on 2 L via nasal cannula.  No accessory muscle use.  Cardiovascular: Regular rate and rhythm, no murmurs / rubs / gallops. No extremity edema. 2+ pedal pulses.  Abdomen: no tenderness, no masses palpated. Bowel sounds positive.  Musculoskeletal: no clubbing / cyanosis. No joint deformity upper and lower extremities. Good ROM, no contractures. Normal muscle tone.  Skin: no rashes, lesions, ulcers. No induration Neurologic: CN 2-12 grossly intact. Sensation intact. Strength 4/5 in all lower extremities.  Patient able to answer some questions but other times would not answer appropriately. Psychiatric:  Alert and oriented x 2. Normal mood.     Labs on Admission: I have personally reviewed following labs and imaging studies  CBC: Recent Labs  Lab 09/07/2019 1634  WBC 6.5  NEUTROABS 5.4  HGB 9.9*  HCT 31.6*  MCV 92.1  PLT 0000000   Basic Metabolic Panel: Recent Labs  Lab 09/19/19 0815 10/03/2019 1634  NA 144 149*  K 4.3 4.3  CL 117* 120*  CO2 15* 14*  GLUCOSE 272* 346*  BUN 97* 39*  CREATININE 4.60* 3.05*  CALCIUM 9.0 9.4   GFR: Estimated Creatinine Clearance: 15.7 mL/min (A) (by C-G formula based on SCr of 3.05 mg/dL (H)). Liver Function Tests: Recent Labs  Lab 10/04/2019 1634  AST 14*  ALT 14  ALKPHOS 57  BILITOT 1.0  PROT 5.8*  ALBUMIN 2.3*   No results for input(s): LIPASE, AMYLASE in the last 168 hours. No results for input(s): AMMONIA in the last 168 hours. Coagulation Profile: No results for input(s): INR, PROTIME in the last 168 hours. Cardiac Enzymes: No results for input(s): CKTOTAL, CKMB, CKMBINDEX, TROPONINI in the last 168 hours. BNP (last 3 results) No results for input(s): PROBNP in the last 8760  hours. HbA1C: No results for input(s): HGBA1C in the last 72 hours. CBG: Recent Labs  Lab 09/19/19 0757 09/19/19 1218  09/18/2019 1428  GLUCAP 227* 222* 319*   Lipid Profile: No results for input(s): CHOL, HDL, LDLCALC, TRIG, CHOLHDL, LDLDIRECT in the last 72 hours. Thyroid Function Tests: No results for input(s): TSH, T4TOTAL, FREET4, T3FREE, THYROIDAB in the last 72 hours. Anemia Panel: No results for input(s): VITAMINB12, FOLATE, FERRITIN, TIBC, IRON, RETICCTPCT in the last 72 hours. Urine analysis:    Component Value Date/Time   COLORURINE YELLOW 03/02/2014 1441   APPEARANCEUR CLEAR 03/02/2014 1441   LABSPEC 1.013 03/02/2014 1441   PHURINE 6.5 03/02/2014 1441   GLUCOSEU NEG 03/02/2014 1441   HGBUR NEG 03/02/2014 1441   BILIRUBINUR NEG 03/02/2014 1441   KETONESUR NEG 03/02/2014 1441   PROTEINUR 100 (A) 03/02/2014 1441   UROBILINOGEN 0.2 03/02/2014 1441   NITRITE NEG 03/02/2014 1441   LEUKOCYTESUR NEG 03/02/2014 1441    Radiological Exams on Admission: Ct Head Wo Contrast  Result Date: 09/27/2019 CLINICAL DATA:  Head trauma worsening mental status EXAM: CT HEAD WITHOUT CONTRAST TECHNIQUE: Contiguous axial images were obtained from the base of the skull through the vertex without intravenous contrast. COMPARISON:  MRI 01/29/2017 FINDINGS: Brain: No acute territorial infarction, hemorrhage or intracranial mass. The ventricles are nonenlarged. Vascular: No hyperdense vessels.  Carotid vascular calcification Skull: Normal. Negative for fracture or focal lesion. Sinuses/Orbits: Mucosal thickening in the ethmoid sinuses Other: None IMPRESSION: Negative non contrasted CT appearance of the brain Electronically Signed   By: Donavan Foil M.D.   On: 09/24/2019 22:29   Dg Chest Portable 1 View  Result Date: 09/30/2019 CLINICAL DATA:  Recent change in mental status. Known COVID positive. EXAM: PORTABLE CHEST 1 VIEW COMPARISON:  09/12/2019 FINDINGS: Worsening of bilateral opacities with  multifocal areas of nodular opacity and peripheral changes. Also with left lower lobe consolidation since prior study. Cardiomediastinal contours are enlarged. No acute bone process. IMPRESSION: Worsening of multifocal pneumonia in the setting of known COVID infection. No other interval changes. Electronically Signed   By: Zetta Bills M.D.   On: 10/03/2019 15:10      Assessment/Plan  Acute hypoxic respiratory failure secondary to COVID pneumonia with superimposed left sided bacterial pneumonia - Pt recently discharged last week for the same.  She was treated with a course of Rocephin and azithromycin, IV steroids and Remdesvir -will start on vancomycin and cefepime for possible hospital-acquired pneumonia. -Check procalcitonin level to help with de-escalation of any antibiotics -Start IV Decadron 6 mg -Spoke with pharmacy and since patient has already completed a course of remdesivir recently, low utility in restarting. -will obtain inflammatory labs  History of renal transplant -Creatinine stable at 3.4 and is improved from previous hospitalization - continue Prograf and mycophenolate - hold home steroids while on high dose IV decadron  - continue bicarb - avoid nephrotoxic agents  History of HIV/Chronic Hepatitis C - CD4 count on 05/07/2019 of 214  - continue ART ,Tivicay and Lamivudine   Hypertension -Continue Norvasc, Coreg, Lasix  Type 2 diabetes - 10 units daily and sensitive sliding scale  Dyslipidemia - continue Crestor   GERD -Continue PPI  Deconditioning -PT/OT eval    Royal Piedra T Kailee Essman DO [facility]  If 7PM-7AM, please contact night-coverage [email or web address] Password [email or web address]  09/06/2019, 11:51 PM

## 2019-09-25 NOTE — Progress Notes (Signed)
Patient seen briefly in her rooms. As noted in yesterday's telephone visit; she has altered mental status. A&O x2 on brief questioning, but unable to hold conversation or provided multi-word answers. She was reportedly altered since returning home from the hospital and may have been so while admitted, but his is unclear. She was improving somewhat since returning home, but has since worsened in the last day or two.  She has also had minimal PO intake, eating only several spoonfuls at a time. She has had increased respiratory rate for the past day per her daughter, which was witnessed on exam.  She also has not been getting all of her discharge medications. Primarily she only started her Lasix in the last couple of days and has not started her bicarb for her acidosis (likely related to CKD).  Given her AMS and concern for worsening CKD in the setting of not being on all of her medications. Will send patient to the ED for further evaluation to expedite her workup and any imaging or IV medications she may require.  ED notified of her immunocompromised status and recent Covid hospitalization to expedite her care.  Case discussed with Dr. Daryll Drown.

## 2019-09-25 NOTE — ED Notes (Signed)
Pt back from CT

## 2019-09-26 ENCOUNTER — Inpatient Hospital Stay (HOSPITAL_COMMUNITY): Payer: Medicare Other

## 2019-09-26 ENCOUNTER — Ambulatory Visit: Payer: Medicare Other

## 2019-09-26 ENCOUNTER — Encounter (HOSPITAL_COMMUNITY): Payer: Self-pay

## 2019-09-26 DIAGNOSIS — U071 COVID-19: Secondary | ICD-10-CM | POA: Diagnosis not present

## 2019-09-26 DIAGNOSIS — E785 Hyperlipidemia, unspecified: Secondary | ICD-10-CM | POA: Diagnosis present

## 2019-09-26 DIAGNOSIS — R140 Abdominal distension (gaseous): Secondary | ICD-10-CM | POA: Diagnosis not present

## 2019-09-26 DIAGNOSIS — R401 Stupor: Secondary | ICD-10-CM | POA: Diagnosis not present

## 2019-09-26 DIAGNOSIS — G9340 Encephalopathy, unspecified: Secondary | ICD-10-CM | POA: Diagnosis not present

## 2019-09-26 DIAGNOSIS — Z6825 Body mass index (BMI) 25.0-25.9, adult: Secondary | ICD-10-CM | POA: Diagnosis not present

## 2019-09-26 DIAGNOSIS — R570 Cardiogenic shock: Secondary | ICD-10-CM | POA: Diagnosis present

## 2019-09-26 DIAGNOSIS — J1282 Pneumonia due to coronavirus disease 2019: Secondary | ICD-10-CM | POA: Diagnosis present

## 2019-09-26 DIAGNOSIS — B20 Human immunodeficiency virus [HIV] disease: Secondary | ICD-10-CM | POA: Diagnosis present

## 2019-09-26 DIAGNOSIS — E870 Hyperosmolality and hypernatremia: Secondary | ICD-10-CM | POA: Diagnosis present

## 2019-09-26 DIAGNOSIS — Z66 Do not resuscitate: Secondary | ICD-10-CM | POA: Diagnosis not present

## 2019-09-26 DIAGNOSIS — M2550 Pain in unspecified joint: Secondary | ICD-10-CM | POA: Diagnosis not present

## 2019-09-26 DIAGNOSIS — M7989 Other specified soft tissue disorders: Secondary | ICD-10-CM | POA: Diagnosis not present

## 2019-09-26 DIAGNOSIS — J90 Pleural effusion, not elsewhere classified: Secondary | ICD-10-CM | POA: Diagnosis not present

## 2019-09-26 DIAGNOSIS — G9341 Metabolic encephalopathy: Secondary | ICD-10-CM | POA: Diagnosis present

## 2019-09-26 DIAGNOSIS — R4020 Unspecified coma: Secondary | ICD-10-CM | POA: Diagnosis not present

## 2019-09-26 DIAGNOSIS — N179 Acute kidney failure, unspecified: Secondary | ICD-10-CM | POA: Diagnosis present

## 2019-09-26 DIAGNOSIS — G253 Myoclonus: Secondary | ICD-10-CM | POA: Diagnosis not present

## 2019-09-26 DIAGNOSIS — R579 Shock, unspecified: Secondary | ICD-10-CM | POA: Diagnosis not present

## 2019-09-26 DIAGNOSIS — J1289 Other viral pneumonia: Secondary | ICD-10-CM

## 2019-09-26 DIAGNOSIS — Y830 Surgical operation with transplant of whole organ as the cause of abnormal reaction of the patient, or of later complication, without mention of misadventure at the time of the procedure: Secondary | ICD-10-CM | POA: Diagnosis present

## 2019-09-26 DIAGNOSIS — R918 Other nonspecific abnormal finding of lung field: Secondary | ICD-10-CM | POA: Diagnosis not present

## 2019-09-26 DIAGNOSIS — J9601 Acute respiratory failure with hypoxia: Secondary | ICD-10-CM | POA: Diagnosis not present

## 2019-09-26 DIAGNOSIS — R0989 Other specified symptoms and signs involving the circulatory and respiratory systems: Secondary | ICD-10-CM | POA: Diagnosis not present

## 2019-09-26 DIAGNOSIS — D631 Anemia in chronic kidney disease: Secondary | ICD-10-CM | POA: Diagnosis not present

## 2019-09-26 DIAGNOSIS — B182 Chronic viral hepatitis C: Secondary | ICD-10-CM | POA: Diagnosis present

## 2019-09-26 DIAGNOSIS — I82621 Acute embolism and thrombosis of deep veins of right upper extremity: Secondary | ICD-10-CM | POA: Diagnosis not present

## 2019-09-26 DIAGNOSIS — K219 Gastro-esophageal reflux disease without esophagitis: Secondary | ICD-10-CM | POA: Diagnosis present

## 2019-09-26 DIAGNOSIS — R400 Somnolence: Secondary | ICD-10-CM | POA: Diagnosis not present

## 2019-09-26 DIAGNOSIS — R627 Adult failure to thrive: Secondary | ICD-10-CM | POA: Diagnosis present

## 2019-09-26 DIAGNOSIS — J159 Unspecified bacterial pneumonia: Secondary | ICD-10-CM | POA: Diagnosis present

## 2019-09-26 DIAGNOSIS — I959 Hypotension, unspecified: Secondary | ICD-10-CM | POA: Diagnosis not present

## 2019-09-26 DIAGNOSIS — Z515 Encounter for palliative care: Secondary | ICD-10-CM | POA: Diagnosis not present

## 2019-09-26 DIAGNOSIS — R7989 Other specified abnormal findings of blood chemistry: Secondary | ICD-10-CM | POA: Diagnosis not present

## 2019-09-26 DIAGNOSIS — Z940 Kidney transplant status: Secondary | ICD-10-CM | POA: Diagnosis not present

## 2019-09-26 DIAGNOSIS — Z7189 Other specified counseling: Secondary | ICD-10-CM | POA: Diagnosis not present

## 2019-09-26 DIAGNOSIS — I82A11 Acute embolism and thrombosis of right axillary vein: Secondary | ICD-10-CM | POA: Diagnosis not present

## 2019-09-26 DIAGNOSIS — T8619 Other complication of kidney transplant: Secondary | ICD-10-CM | POA: Diagnosis present

## 2019-09-26 DIAGNOSIS — Z7401 Bed confinement status: Secondary | ICD-10-CM | POA: Diagnosis not present

## 2019-09-26 DIAGNOSIS — Z01818 Encounter for other preprocedural examination: Secondary | ICD-10-CM | POA: Diagnosis not present

## 2019-09-26 DIAGNOSIS — R54 Age-related physical debility: Secondary | ICD-10-CM | POA: Diagnosis present

## 2019-09-26 DIAGNOSIS — Z4682 Encounter for fitting and adjustment of non-vascular catheter: Secondary | ICD-10-CM | POA: Diagnosis not present

## 2019-09-26 DIAGNOSIS — R4182 Altered mental status, unspecified: Secondary | ICD-10-CM | POA: Diagnosis not present

## 2019-09-26 DIAGNOSIS — E1110 Type 2 diabetes mellitus with ketoacidosis without coma: Secondary | ICD-10-CM | POA: Diagnosis present

## 2019-09-26 DIAGNOSIS — M79A11 Nontraumatic compartment syndrome of right upper extremity: Secondary | ICD-10-CM | POA: Diagnosis not present

## 2019-09-26 DIAGNOSIS — R509 Fever, unspecified: Secondary | ICD-10-CM | POA: Diagnosis not present

## 2019-09-26 DIAGNOSIS — D649 Anemia, unspecified: Secondary | ICD-10-CM | POA: Diagnosis not present

## 2019-09-26 DIAGNOSIS — I749 Embolism and thrombosis of unspecified artery: Secondary | ICD-10-CM | POA: Diagnosis not present

## 2019-09-26 DIAGNOSIS — J189 Pneumonia, unspecified organism: Secondary | ICD-10-CM | POA: Diagnosis not present

## 2019-09-26 DIAGNOSIS — N184 Chronic kidney disease, stage 4 (severe): Secondary | ICD-10-CM | POA: Diagnosis present

## 2019-09-26 DIAGNOSIS — N183 Chronic kidney disease, stage 3 unspecified: Secondary | ICD-10-CM | POA: Diagnosis not present

## 2019-09-26 DIAGNOSIS — R58 Hemorrhage, not elsewhere classified: Secondary | ICD-10-CM | POA: Diagnosis not present

## 2019-09-26 DIAGNOSIS — E43 Unspecified severe protein-calorie malnutrition: Secondary | ICD-10-CM | POA: Diagnosis present

## 2019-09-26 LAB — RESPIRATORY PANEL BY PCR

## 2019-09-26 LAB — CBC
HCT: 32.8 % — ABNORMAL LOW (ref 36.0–46.0)
Hemoglobin: 10.4 g/dL — ABNORMAL LOW (ref 12.0–15.0)
MCH: 28.3 pg (ref 26.0–34.0)
MCHC: 31.7 g/dL (ref 30.0–36.0)
MCV: 89.1 fL (ref 80.0–100.0)
Platelets: 147 10*3/uL — ABNORMAL LOW (ref 150–400)
RBC: 3.68 MIL/uL — ABNORMAL LOW (ref 3.87–5.11)
RDW: 15.2 % (ref 11.5–15.5)
WBC: 6.6 10*3/uL (ref 4.0–10.5)
nRBC: 0 % (ref 0.0–0.2)

## 2019-09-26 LAB — GLUCOSE, CAPILLARY
Glucose-Capillary: 276 mg/dL — ABNORMAL HIGH (ref 70–99)
Glucose-Capillary: 304 mg/dL — ABNORMAL HIGH (ref 70–99)
Glucose-Capillary: 224 mg/dL — ABNORMAL HIGH (ref 70–99)
Glucose-Capillary: 185 mg/dL — ABNORMAL HIGH (ref 70–99)
Glucose-Capillary: 181 mg/dL — ABNORMAL HIGH (ref 70–99)

## 2019-09-26 LAB — COMPREHENSIVE METABOLIC PANEL
ALT: 13 U/L (ref 0–44)
AST: 14 U/L — ABNORMAL LOW (ref 15–41)
Albumin: 2.8 g/dL — ABNORMAL LOW (ref 3.5–5.0)
Alkaline Phosphatase: 59 U/L (ref 38–126)
Anion gap: 16 — ABNORMAL HIGH (ref 5–15)
BUN: 42 mg/dL — ABNORMAL HIGH (ref 8–23)
CO2: 10 mmol/L — ABNORMAL LOW (ref 22–32)
Calcium: 9.6 mg/dL (ref 8.9–10.3)
Chloride: 122 mmol/L — ABNORMAL HIGH (ref 98–111)
Creatinine, Ser: 2.75 mg/dL — ABNORMAL HIGH (ref 0.44–1.00)
GFR calc Af Amer: 19 mL/min — ABNORMAL LOW (ref >60)
GFR calc non Af Amer: 17 mL/min — ABNORMAL LOW (ref >60)
Glucose, Bld: 349 mg/dL — ABNORMAL HIGH (ref 70–99)
Potassium: 4.7 mmol/L (ref 3.5–5.1)
Sodium: 148 mmol/L — ABNORMAL HIGH (ref 135–145)
Total Bilirubin: 1.2 mg/dL (ref 0.3–1.2)
Total Protein: 7.1 g/dL (ref 6.5–8.1)

## 2019-09-26 LAB — D-DIMER, QUANTITATIVE: D-Dimer, Quant: 9.32 ug/mL-FEU — ABNORMAL HIGH (ref 0.00–0.50)

## 2019-09-26 LAB — C-REACTIVE PROTEIN: CRP: 3.7 mg/dL — ABNORMAL HIGH (ref <1.0)

## 2019-09-26 LAB — LACTATE DEHYDROGENASE: LDH: 293 U/L — ABNORMAL HIGH (ref 98–192)

## 2019-09-26 LAB — PROCALCITONIN: Procalcitonin: 0.18 ng/mL

## 2019-09-26 LAB — MRSA PCR SCREENING: MRSA by PCR: NEGATIVE

## 2019-09-26 MED ORDER — INSULIN ASPART 100 UNIT/ML ~~LOC~~ SOLN
0.0000 [IU] | Freq: Three times a day (TID) | SUBCUTANEOUS | Status: DC
  Administered 2019-09-26: 7 [IU] via SUBCUTANEOUS
  Administered 2019-09-26: 2 [IU] via SUBCUTANEOUS
  Administered 2019-09-26: 3 [IU] via SUBCUTANEOUS

## 2019-09-26 MED ORDER — MAGNESIUM OXIDE 400 (241.3 MG) MG PO TABS
400.0000 mg | ORAL_TABLET | Freq: Every day | ORAL | Status: DC
  Administered 2019-09-26: 400 mg via ORAL
  Filled 2019-09-26 (×2): qty 1

## 2019-09-26 MED ORDER — HEPARIN SODIUM (PORCINE) 5000 UNIT/ML IJ SOLN
7500.0000 [IU] | Freq: Three times a day (TID) | INTRAMUSCULAR | Status: DC
  Administered 2019-09-26 – 2019-09-29 (×9): 7500 [IU] via SUBCUTANEOUS
  Filled 2019-09-26 (×9): qty 2

## 2019-09-26 MED ORDER — SODIUM BICARBONATE 650 MG PO TABS
650.0000 mg | ORAL_TABLET | Freq: Three times a day (TID) | ORAL | Status: DC
  Administered 2019-09-26: 650 mg via ORAL
  Filled 2019-09-26 (×8): qty 1

## 2019-09-26 MED ORDER — FUROSEMIDE 20 MG PO TABS
80.0000 mg | ORAL_TABLET | Freq: Every day | ORAL | Status: DC
  Administered 2019-09-26: 80 mg via ORAL
  Filled 2019-09-26: qty 4

## 2019-09-26 MED ORDER — TACROLIMUS 1 MG PO CAPS
2.0000 mg | ORAL_CAPSULE | Freq: Every day | ORAL | Status: DC
  Administered 2019-09-26: 2 mg via ORAL
  Filled 2019-09-26 (×3): qty 2

## 2019-09-26 MED ORDER — LOPERAMIDE HCL 2 MG PO CAPS
2.0000 mg | ORAL_CAPSULE | Freq: Three times a day (TID) | ORAL | Status: DC | PRN
  Administered 2019-09-28: 21:00:00 2 mg via ORAL
  Filled 2019-09-26 (×2): qty 1

## 2019-09-26 MED ORDER — ROSUVASTATIN CALCIUM 20 MG PO TABS
20.0000 mg | ORAL_TABLET | Freq: Every day | ORAL | Status: DC
  Administered 2019-09-26: 20 mg via ORAL
  Filled 2019-09-26 (×2): qty 1

## 2019-09-26 MED ORDER — CINACALCET HCL 30 MG PO TABS
60.0000 mg | ORAL_TABLET | Freq: Every day | ORAL | Status: DC
  Administered 2019-09-28 – 2019-10-04 (×6): 60 mg via ORAL
  Filled 2019-09-26 (×15): qty 2

## 2019-09-26 MED ORDER — AMLODIPINE BESYLATE 5 MG PO TABS
10.0000 mg | ORAL_TABLET | Freq: Every day | ORAL | Status: DC
  Administered 2019-09-26: 10 mg via ORAL
  Filled 2019-09-26: qty 1

## 2019-09-26 MED ORDER — POTASSIUM CHLORIDE CRYS ER 20 MEQ PO TBCR
20.0000 meq | EXTENDED_RELEASE_TABLET | Freq: Every day | ORAL | Status: DC
  Administered 2019-09-26: 20 meq via ORAL
  Filled 2019-09-26: qty 1

## 2019-09-26 MED ORDER — INSULIN ASPART 100 UNIT/ML ~~LOC~~ SOLN
4.0000 [IU] | Freq: Three times a day (TID) | SUBCUTANEOUS | Status: DC
  Administered 2019-09-26: 4 [IU] via SUBCUTANEOUS

## 2019-09-26 MED ORDER — CARVEDILOL 25 MG PO TABS
25.0000 mg | ORAL_TABLET | Freq: Two times a day (BID) | ORAL | Status: DC
  Administered 2019-09-26: 09:00:00 25 mg via ORAL
  Filled 2019-09-26: qty 2
  Filled 2019-09-26: qty 1
  Filled 2019-09-26: qty 2
  Filled 2019-09-26 (×2): qty 1

## 2019-09-26 MED ORDER — PANTOPRAZOLE SODIUM 20 MG PO TBEC
20.0000 mg | DELAYED_RELEASE_TABLET | Freq: Every day | ORAL | Status: DC
  Administered 2019-09-26: 20 mg via ORAL
  Filled 2019-09-26 (×3): qty 1

## 2019-09-26 MED ORDER — INSULIN GLARGINE 100 UNIT/ML ~~LOC~~ SOLN
10.0000 [IU] | Freq: Every day | SUBCUTANEOUS | Status: DC
  Administered 2019-09-26: 10 [IU] via SUBCUTANEOUS
  Filled 2019-09-26: qty 0.1

## 2019-09-26 MED ORDER — MYCOPHENOLATE SODIUM 180 MG PO TBEC
540.0000 mg | DELAYED_RELEASE_TABLET | Freq: Two times a day (BID) | ORAL | Status: DC
  Administered 2019-09-26: 540 mg via ORAL
  Filled 2019-09-26 (×5): qty 3

## 2019-09-26 MED ORDER — HEPARIN SODIUM (PORCINE) 5000 UNIT/ML IJ SOLN: 5000.0000 [IU] | Freq: Three times a day (TID) | INTRAMUSCULAR | Status: DC

## 2019-09-26 MED ORDER — CHLORHEXIDINE GLUCONATE 0.12 % MT SOLN
15.0000 mL | Freq: Two times a day (BID) | OROMUCOSAL | Status: DC
  Administered 2019-09-26 – 2019-09-27 (×2): 15 mL via OROMUCOSAL
  Filled 2019-09-26 (×2): qty 15

## 2019-09-26 MED ORDER — VANCOMYCIN HCL IN DEXTROSE 1-5 GM/200ML-% IV SOLN
1000.0000 mg | INTRAVENOUS | Status: DC
  Administered 2019-09-28: 05:00:00 1000 mg via INTRAVENOUS
  Filled 2019-09-26: qty 200

## 2019-09-26 MED ORDER — METHYLPREDNISOLONE SODIUM SUCC 40 MG IJ SOLR
30.0000 mg | Freq: Two times a day (BID) | INTRAMUSCULAR | Status: DC
  Administered 2019-09-26 – 2019-09-27 (×2): 30 mg via INTRAVENOUS
  Filled 2019-09-26 (×2): qty 1

## 2019-09-26 MED ORDER — INSULIN GLARGINE 100 UNIT/ML ~~LOC~~ SOLN: 20.0000 [IU] | Freq: Every day | SUBCUTANEOUS | Status: DC

## 2019-09-26 MED ORDER — TACROLIMUS 1 MG PO CAPS
3.0000 mg | ORAL_CAPSULE | Freq: Every day | ORAL | Status: DC
  Filled 2019-09-26 (×2): qty 3

## 2019-09-26 MED ORDER — LAMIVUDINE 10 MG/ML PO SOLN
100.0000 mg | Freq: Every day | ORAL | Status: DC
  Administered 2019-09-26: 100 mg via ORAL
  Filled 2019-09-26 (×4): qty 10

## 2019-09-26 MED ORDER — SODIUM CHLORIDE 0.9 % IV SOLN
2.0000 g | Freq: Every day | INTRAVENOUS | Status: DC
  Administered 2019-09-26 – 2019-09-30 (×6): 2 g via INTRAVENOUS
  Filled 2019-09-26 (×8): qty 2

## 2019-09-26 MED ORDER — TACROLIMUS 1 MG PO CAPS: 2.0000 mg | ORAL_CAPSULE | ORAL | Status: DC

## 2019-09-26 MED ORDER — CALCITRIOL 0.25 MCG PO CAPS
0.2500 ug | ORAL_CAPSULE | Freq: Every day | ORAL | Status: DC
  Administered 2019-09-26: 0.25 ug via ORAL
  Filled 2019-09-26 (×4): qty 1

## 2019-09-26 MED ORDER — FAMOTIDINE 20 MG PO TABS: 20.0000 mg | ORAL_TABLET | Freq: Every day | ORAL | Status: DC | PRN

## 2019-09-26 MED ORDER — DEXAMETHASONE SODIUM PHOSPHATE 10 MG/ML IJ SOLN
6.0000 mg | Freq: Every day | INTRAMUSCULAR | Status: DC
  Administered 2019-09-26: 6 mg via INTRAVENOUS
  Filled 2019-09-26 (×2): qty 1

## 2019-09-26 MED ORDER — INSULIN GLARGINE 100 UNIT/ML ~~LOC~~ SOLN
20.0000 [IU] | Freq: Every day | SUBCUTANEOUS | Status: DC
  Administered 2019-09-26: 20 [IU] via SUBCUTANEOUS
  Filled 2019-09-26: qty 0.2

## 2019-09-26 MED ORDER — DOLUTEGRAVIR SODIUM 50 MG PO TABS
50.0000 mg | ORAL_TABLET | Freq: Every day | ORAL | Status: DC
  Administered 2019-09-26 – 2019-10-04 (×8): 50 mg via ORAL
  Filled 2019-09-26 (×14): qty 1

## 2019-09-26 MED ORDER — PANTOPRAZOLE SODIUM 40 MG PO TBEC: 40.0000 mg | DELAYED_RELEASE_TABLET | Freq: Every day | ORAL | Status: DC

## 2019-09-26 MED ORDER — SODIUM BICARBONATE 8.4 % IV SOLN
INTRAVENOUS | Status: DC
  Administered 2019-09-26: 15:00:00 via INTRAVENOUS
  Filled 2019-09-26 (×2): qty 900

## 2019-09-26 MED ORDER — ASPIRIN EC 81 MG PO TBEC
81.0000 mg | DELAYED_RELEASE_TABLET | Freq: Every day | ORAL | Status: DC
  Administered 2019-09-26: 81 mg via ORAL
  Filled 2019-09-26: qty 1

## 2019-09-26 MED ORDER — ORAL CARE MOUTH RINSE: 15.0000 mL | Freq: Two times a day (BID) | OROMUCOSAL | Status: DC

## 2019-09-26 MED ORDER — VANCOMYCIN HCL 10 G IV SOLR
1250.0000 mg | Freq: Once | INTRAVENOUS | Status: AC
  Administered 2019-09-26: 1250 mg via INTRAVENOUS
  Filled 2019-09-26: qty 1250

## 2019-09-26 NOTE — Progress Notes (Signed)
Inpatient Diabetes Program Recommendations  AACE/ADA: New Consensus Statement on Inpatient Glycemic Control (2015)  Target Ranges:  Prepandial:   less than 140 mg/dL      Peak postprandial:   less than 180 mg/dL (1-2 hours)      Critically ill patients:  140 - 180 mg/dL   Lab Results  Component Value Date   GLUCAP 304 (H) 09/26/2019   HGBA1C 6.9 (H) 06/17/2018    Review of Glycemic Control Results for [name], [name] (MRN [id number]) as of 09/26/2019 09:45  Ref. Range 09/19/2019 12:18 09/20/2019 14:28 09/26/2019 06:18 09/26/2019 08:04  Glucose-Capillary Latest Ref Range: 70 - 99 mg/dL 222 (H) 319 (H) 276 (H) 304 (H)   Diabetes history: Type 2 DM Outpatient Diabetes medications: Xultophy 100-3.6 16 units QAM Current orders for Inpatient glycemic control: Novolog 0-9 units TID, Lantus 10 units QHS Decadron 6 mg QD  Inpatient Diabetes Program Recommendations:    Consider increasing Lantus to 10 units BID,  adding Novolog 0-5 units QHS and adding Novolog 2 units TID (assuming patient is consuming >50% of meal).  Thanks, Bronson Curb, MSN, RNC-OB Diabetes Coordinator [phone] (8a-5p)

## 2019-09-26 NOTE — Plan of Care (Signed)
  Problem: Education: Goal: Knowledge of General Education information will improve Description: Including pain rating scale, medication(s)/side effects and non-pharmacologic comfort measures Outcome: Not Progressing Variance Did not return to baseline function   Problem: Health Behavior/Discharge Planning: Goal: Ability to manage health-related needs will improve Outcome: Not Progressing Variance Did not return to baseline function   Problem: Nutrition: Goal: Adequate nutrition will be maintained Outcome: Not Progressing Variance Did not return to baseline function   Problem: Coping: Goal: Level of anxiety will decrease Outcome: Not Progressing Variance Did not return to baseline function

## 2019-09-26 NOTE — Progress Notes (Addendum)
PROGRESS NOTE                                                                                                                                                                                                             Patient Demographics:    [name], is a 73 y.o. female, DOB - December 29, 1945, [id number]  Outpatient Primary MD for the patient is Earlene Plater, MD   Admit date - 10/04/2019   LOS - 0  Chief Complaint  Patient presents with   Altered Mental Status   Shortness of Breath   Nausea       Brief Narrative: Patient is a 73 y.o. 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  Upon further evaluation in the emergency room-she was found to have worsening infiltrates on chest x-ray, she was subsequently transferred to the hospitalist service at [facility] for further evaluation and treatment.   Subjective:    [name] today looks very frail-she is awake-alert-able to answer most of my questions appropriately, however at times-she was repeating herself.  While talking with the nursing staff-patient was able to complete a sentence-as she was complaining about being moved in bed.   Assessment  & Plan :   Pneumonia: Completed a course of remdesivir last admission-has worsening infiltrates on chest x-ray-severely immunocompromised-given history of HIV and immunosuppression for renal transplantation-hence differential is broad-but her inflammatory markers are not terribly elevated-hence do not think this is ongoing Covid pneumonia.  Thankfully she is not  hypoxic-procalcitonin is not elevated. We will go ahead and obtain a respiratory virus panel as well.  We will get a noncontrast CT chest to delineate lung parenchyma better.  In the meantime-we will continue with cefepime and vancomycin.  Once CT chest is obtained-we will touch base with pulmonology and discuss further.             Fever: afebrile  O2 requirements: On RA  COVID-19 Labs: Recent Labs    09/26/19 0827  DDIMER 9.32*  LDH 293*  CRP 3.7*    Lab Results  Component Value Date   SARSCOV2NAA POSITIVE (A) 09/12/2019   [facility] Not Detected 05/09/2019     COVID-19 Medications: Steroids:10/22>> Remdesivir:10/10>>10/14 Actemra:not a candidate given severe immunosuppression Convalescent Plasma:N/A  Research Studies:N/A  Other medications: Diuretics:Euvolemic Antibiotics: 10/22>> vancomycin/cefepime  Prone/Incentive Spirometry:  incentive spirometry use 3-4/hour.  DVT Prophylaxis  :  Heparin (given elevated D-dimer-we will change to higher prophylactic dose)  Acute metabolic encephalopathy with occasional myoclonic jerk: Suspect this is from pneumonia, hypernatremia-however she is severely immunocompromised.  CT head without any abnormalities.  We will go ahead and obtain a MRI brain-if her mentation does not clear-we will order a EEG and may need neurology evaluation.  Spoke with Dr. Edrick Kins sure if myoclonic jerks can be attributed to any of immunosuppressive's-obtaining a Prograf level tomorrow morning.  Suggests we continue with the current dose of immunosuppressive still further work-up has been obtained.  DM-2 with hyperglycemia: CBGs elevated as patient on steroids-will be started on D5W for hypernatremia as well-increase Lantus to 20 units, add 4 units of NovoLog with meals.  Avoid very strict glycemic control in this frail patient.  Hypernatremia: Appears very frail-somewhat dehydrated-stop Lasix-start gentle hydration.  Repeat electrolytes  tomorrow.  Metabolic acidosis: Continue with oral bicarb supplementation-we will hydrate with D5W and add IV bicarb as well.  Recheck electrolytes tomorrow.  Per daughter-patient was not taking bicarb supplementation since her most recent discharge.  HIV (CD4 count on 05/07/2019 214): Spoke with pharmacy-ART dosage appears appropriate.  History of renal transplantation with CKD stage IV: Continue Prograf, mycophenolate-on IV steroids.  Obtain Prograf level tomorrow morning.  HTN: Stable-continue Norvasc-Coreg-hold Lasix  Dyslipidemia: Continue Crestor  GERD: Continue PPI  Failure to thrive/deconditioning/debility: Obtain PT/OT and nutrition eval.  May require SNF.  She is incredibly frail and deconditioned.   ABG:    Component Value Date/Time   HCO3 17.5 (L) 10/26/2007 1805   TCO2 19 10/26/2007 1805   ACIDBASEDEF 10.0 (H) 10/26/2007 1805    Vent Settings: N/A   Condition - -very tenuous with risk for further deterioration  Family Communication  :  Daughter updated over the phone  Code Status :  Full Code  Diet :  Diet Order            Diet full liquid Room service appropriate? Yes; Fluid consistency: Thin  Diet effective now               Disposition Plan  :  Remain hospitalized  Barriers to discharge: Treatment for pneumonia with IV antibiotics  Consults  :  None  Procedures  :  None  Antibiotics  :    Anti-infectives (From admission, onward)   Start     Dose/Rate Route Frequency Ordered Stop   09/28/19 0600  vancomycin (VANCOCIN) IVPB 1000 mg/200 mL premix     1,000 mg 200 mL/hr over 60 Minutes Intravenous Every 48 hours 09/26/19 0024     09/26/19 1000  dolutegravir (TIVICAY) tablet 50 mg     50 mg Oral Daily 09/26/19 0024     09/26/19 1000  lamiVUDine (EPIVIR) 10 MG/ML solution 100 mg     100 mg Oral Daily 09/26/19 0024     09/26/19 0030  vancomycin (VANCOCIN) 1,250 mg in sodium chloride 0.9 % 250 mL IVPB     1,250 mg 166.7 mL/hr over 90 Minutes  Intravenous  Once 09/26/19 0024 09/26/19 0404   09/26/19 0030  ceFEPIme (MAXIPIME) 2 g in sodium chloride 0.9 % 100 mL IVPB     2  g 200 mL/hr over 30 Minutes Intravenous Daily at bedtime 09/26/19 0024        Inpatient Medications  Scheduled Meds:  amLODipine  10 mg Oral Daily   aspirin EC  81 mg Oral Daily   calcitRIOL  0.25 mcg Oral Daily   carvedilol  25 mg Oral BID WC   cinacalcet  60 mg Oral Daily   dexamethasone (DECADRON) injection  6 mg Intravenous Daily   dolutegravir  50 mg Oral Daily   furosemide  80 mg Oral Daily   heparin  5,000 Units Subcutaneous Q8H   insulin aspart  0-9 Units Subcutaneous TID WC   insulin glargine  10 Units Subcutaneous QHS   lamiVUDine  100 mg Oral Daily   magnesium oxide  400 mg Oral Daily   mycophenolate  540 mg Oral BID   pantoprazole  20 mg Oral Daily   potassium chloride SA  20 mEq Oral Daily   rosuvastatin  20 mg Oral Daily   sodium bicarbonate  650 mg Oral TID   tacrolimus  2 mg Oral Daily   tacrolimus  3 mg Oral QHS   Continuous Infusions:  ceFEPime (MAXIPIME) IV Stopped (09/26/19 0234)   [START ON 09/28/2019] vancomycin     PRN Meds:.loperamide   Time Spent in minutes  35  See all Orders from today for further details   Oren Binet M.D on 09/26/2019 at 12:00 PM  To page go to [email or web address] - use universal password  [facility] -  Office  [phone]    Objective:   Vitals:   09/26/19 0330 09/26/19 0520 09/26/19 0807 09/26/19 0915  BP: (!) 156/71 (!) 142/79 (!) 170/60   Pulse: 68 78  72  Resp: (!) 33 (!) 25 (!) 29 20  Temp:  98 F (36.7 C) 98 F (36.7 C)   TempSrc:  Oral Oral   SpO2: 100% 93%  97%  Weight:  64.2 kg    Height:        Wt Readings from Last 3 Encounters:  09/26/19 64.2 kg  09/18/19 68.9 kg  05/27/19 65.8 kg     Intake/Output Summary (Last 24 hours) at 09/26/2019 1200 Last data filed at 09/26/2019 1000 Gross per 24 hour  Intake 240 ml  Output --  Net 240  ml     Physical Exam Gen Exam: Very frail-debilitated-chronically sick appearing.  But not in distress. HEENT:atraumatic, normocephalic Chest: B/L clear to auscultation anteriorly CVS:S1S2 regular Abdomen:soft non tender, non distended Extremities:no edema Neurology: Non focal-but has significant generalized weakness. Skin: no rash   Data Review:    CBC Recent Labs  Lab 09/27/2019 1634 09/26/19 0827  WBC 6.5 6.6  HGB 9.9* 10.4*  HCT 31.6* 32.8*  PLT 162 147*  MCV 92.1 89.1  MCH 28.9 28.3  MCHC 31.3 31.7  RDW 15.0 15.2  LYMPHSABS 0.4*  --   MONOABS 0.6  --   EOSABS 0.1  --   BASOSABS 0.0  --     Chemistries  Recent Labs  Lab 09/21/2019 1634 09/26/19 0827  NA 149* 148*  K 4.3 4.7  CL 120* 122*  CO2 14* 10*  GLUCOSE 346* 349*  BUN 39* 42*  CREATININE 3.05* 2.75*  CALCIUM 9.4 9.6  AST 14* 14*  ALT 14 13  ALKPHOS 57 59  BILITOT 1.0 1.2   ------------------------------------------------------------------------------------------------------------------ No results for input(s): CHOL, HDL, LDLCALC, TRIG, CHOLHDL, LDLDIRECT in the last 72 hours.  Lab Results  Component Value Date   HGBA1C  6.9 (H) 06/17/2018   ------------------------------------------------------------------------------------------------------------------ No results for input(s): TSH, T4TOTAL, T3FREE, THYROIDAB in the last 72 hours.  Invalid input(s): FREET3 ------------------------------------------------------------------------------------------------------------------ No results for input(s): VITAMINB12, FOLATE, FERRITIN, TIBC, IRON, RETICCTPCT in the last 72 hours.  Coagulation profile No results for input(s): INR, PROTIME in the last 168 hours.  Recent Labs    09/26/19 0827  DDIMER 9.32*    Cardiac Enzymes No results for input(s): CKMB, TROPONINI, MYOGLOBIN in the last 168 hours.  Invalid input(s):  CK ------------------------------------------------------------------------------------------------------------------    Component Value Date/Time   BNP 242.9 (H) 06/17/2018 1955    Micro Results No results found for this or any previous visit (from the past 240 hour(s)).  Radiology Reports Ct Head Wo Contrast  Result Date: 09/11/2019 CLINICAL DATA:  Head trauma worsening mental status EXAM: CT HEAD WITHOUT CONTRAST TECHNIQUE: Contiguous axial images were obtained from the base of the skull through the vertex without intravenous contrast. COMPARISON:  MRI 01/29/2017 FINDINGS: Brain: No acute territorial infarction, hemorrhage or intracranial mass. The ventricles are nonenlarged. Vascular: No hyperdense vessels.  Carotid vascular calcification Skull: Normal. Negative for fracture or focal lesion. Sinuses/Orbits: Mucosal thickening in the ethmoid sinuses Other: None IMPRESSION: Negative non contrasted CT appearance of the brain Electronically Signed   By: Donavan Foil M.D.   On: 09/06/2019 22:29   US Renal  Result Date: 09/17/2019 CLINICAL DATA:  Acute kidney injury EXAM: RENAL / URINARY TRACT ULTRASOUND COMPLETE COMPARISON:  None. FINDINGS: Right Kidney: Renal measurements: 9.2 x 4.7 x 4.6 cm = volume: 104 mL. Markedly increased echotexture and cortical thinning. 1.2 cm cyst in the midpole. No hydronephrosis. Left Kidney: Renal measurements: 9.1 x 4.8 x 4.7 cm = volume: 108 mL. Markedly increased echotexture and cortical thinning. No mass or hydronephrosis. Bladder: Appears normal for degree of bladder distention. Incidentally noted is ascites in the lower abdomen/pelvis. IMPRESSION: Markedly increased echotexture within the kidneys with cortical thinning compatible with chronic medical renal disease. No hydronephrosis. Ascites. Electronically Signed   By: Rolm Baptise M.D.   On: 09/17/2019 12:05   Dg Chest Portable 1 View  Result Date: 09/06/2019 CLINICAL DATA:  Recent change in mental  status. Known COVID positive. EXAM: PORTABLE CHEST 1 VIEW COMPARISON:  09/12/2019 FINDINGS: Worsening of bilateral opacities with multifocal areas of nodular opacity and peripheral changes. Also with left lower lobe consolidation since prior study. Cardiomediastinal contours are enlarged. No acute bone process. IMPRESSION: Worsening of multifocal pneumonia in the setting of known COVID infection. No other interval changes. Electronically Signed   By: Zetta Bills M.D.   On: 09/24/2019 15:10   Dg Chest Portable 1 View  Result Date: 09/12/2019 CLINICAL DATA:  Cough and fever. EXAM: PORTABLE CHEST 1 VIEW COMPARISON:  06/17/2018 FINDINGS: There are patchy faint bilateral peripheral infiltrates in both mid zones and at both lung bases worrisome for viral pneumonia. Heart size and pulmonary vascularity are normal. No effusions. No significant bone abnormality. IMPRESSION: Patchy bilateral peripheral infiltrates in the mid zones and at both lung bases worrisome for viral pneumonia. No other significant abnormalities. Electronically Signed   By: Lorriane Shire M.D.   On: 09/12/2019 18:16

## 2019-09-26 NOTE — Progress Notes (Signed)
Spoke with [facility] Pam about finding Rn coverage for pt so that they can be scanned at [employer]. Per Pam she was unable to find any coverage for tonight. She emailed [employer] and they also had no RN to cover.

## 2019-09-26 NOTE — Progress Notes (Signed)
NAME:  [name], MRN:  [id number], DOB:  [birth year], LOS: 0 ADMISSION DATE:  09/12/2019, CONSULTATION DATE:  10/23 REFERRING MD:  Sloan Leiter, CHIEF COMPLAINT:  Dyspnea   Brief History   73 y/o female admitted on 10/22 to [facility] for lethargy and pulmonary new pulmonary infiltrates that developed one week after she was treated for COVID pneumonia.  History of present illness   73 year old female with a past medical history significant for renal transplant and HIV was admitted to this facility yesterday for confusion, lethargy, and patchy bilateral infiltrates.  She was treated here for 6 days earlier in the month and discharged on October 16 after being treated with steroids and remdesivir.  She returned to the hospital with lethargy, fatigue, and some increased work of breathing.  She is too confused to provide a history for me so history is obtained by chart review.  She tells me that she thinks she is in a church right now.  During her hospitalization she was treated with remdesivir for 5 days and relatively low doses of methylprednisolone, 20 mg daily.  Her tacrolimus and mycophenolate were continued.  She continued on these medications at home.  There was a concern for possible tacrolimus toxicity during her hospitalization.  Past Medical History  DM2 PCP pneumonia 1998 HTN HIV Hep C GERD ESRD s/p renal transplant CHF > LV normal, moderate concentric LVH, LVEF 60-65%, left atrium moderately dilated  Significant Hospital Events   10/22 admission  Consults:  Pulmonary   Procedures:    Significant Diagnostic Tests:  10/23 CT chest images personally reviewed> patchy and nodular consolidation bilaterally predominantly in upper lobes and periphery but upper lobe predeominant bronchovascular distribution some patchy ggo in bases with pleural effusions bilaterally, ? Cavitary lesion RLL 10/22 CT head > NAICP  Micro Data:  10/23 respiratory viral panel > negative  Antimicrobials:   10/22 vanc >  10/22 cefepime >   Interim history/subjective:  As above  Objective   Blood pressure (!) 155/50, pulse 72, temperature (!) 97.2 F (36.2 C), temperature source Oral, resp. rate 20, height 5\' 3"  (1.6 m), weight 64.2 kg, SpO2 94 %.        Intake/Output Summary (Last 24 hours) at 09/26/2019 1646 Last data filed at 09/26/2019 1455 Gross per 24 hour  Intake 480 ml  Output 400 ml  Net 80 ml   Filed Weights   09/13/2019 1421 09/26/19 0520  Weight: 65.8 kg 64.2 kg    Examination:  General:  Resting comfortably in bed HENT: NCAT OP clear PULM: CTA B, normal effort CV: RRR, no mgr GI: BS+, soft, nontender MSK: normal bulk and tone Neuro: somnolent but opens eyes to voice, not oriented to place or situation, moves all four extremities well, no myoclonus for my exam   Resolved Hospital Problem list     Assessment & Plan:  73 year old female with a complicated medical history including being immunocompromised from HIV status as well as receiving a kidney transplant returns to our facility for evaluation of lethargy, delirium, some myoclonic jerking, increased shortness of breath.  Objectively she has patchy consolidation in the periphery and in a bronchovascular distribution in the upper lobes as well as a question of a cavitary lesion in the right lower lobe.  She is encephalopathic and her kidney function appears to be at baseline.  The differential diagnosis is broad, it is difficult to know if this is due to ongoing Covid infection or post inflammatory/immune mediated injury.  Because she  is immunocompromised it is reasonable to consider healthcare associated pneumonia, opportunistic organisms like nocardia, actinomycosis, less likely PJP, CMV, or an invasive mold. Doesn't look like PCP to me.  If she has an abnormal MRI brain with mass lesions then certainly a fungal or neoplastic process.  Possible inflammatory explanations of what is going on her lung could be organizing  pneumonia, acute eosinophilic pneumonia, less likely cryoglobulinemia vasculitis.  Neoplastic causes would include B-cell lymphoma given her immunocompromised state.  I think the most likely etiology of her lung pathology is organizing pneumonia though I do not understand why this would explain her confusion and why it would develop on cellcept and tacrolimus.  Plan: Agree with vancomycin and cefepime for now Agree with MRI brain Agree with solumedrol as you are doing Her family has given verbal consent (daughter Lavella Lemons) for bronchoscopy, plan that for 10/24 AM Keep NPO after midnight Agree with checking tacrolimus levels Will add CMV pcr, serum crypto antigen, b-d glucan, galactomannan   Best practice:  Diet: NPO after midnight Pain/Anxiety/Delirium protocol (if indicated): n/a VAP protocol (if indicated): n/a DVT prophylaxis: sub q heparin GI prophylaxis: n/a Glucose control: per TRH Mobility: bed rest Code Status: full Family Communication: I updated her daughter by phone, she voiced understanding and gave consent for bronchoscopy after I explained the risks and benefits.  Disposition:   Labs   CBC: Recent Labs  Lab 09/07/2019 1634 09/26/19 0827  WBC 6.5 6.6  NEUTROABS 5.4  --   HGB 9.9* 10.4*  HCT 31.6* 32.8*  MCV 92.1 89.1  PLT 162 147*    Basic Metabolic Panel: Recent Labs  Lab 09/16/2019 1634 09/26/19 0827  NA 149* 148*  K 4.3 4.7  CL 120* 122*  CO2 14* 10*  GLUCOSE 346* 349*  BUN 39* 42*  CREATININE 3.05* 2.75*  CALCIUM 9.4 9.6   GFR: Estimated Creatinine Clearance: 17.2 mL/min (A) (by C-G formula based on SCr of 2.75 mg/dL (H)). Recent Labs  Lab 09/08/2019 1634 09/26/19 0827  PROCALCITON  --  0.18  WBC 6.5 6.6  LATICACIDVEN 1.9  --     Liver Function Tests: Recent Labs  Lab 09/24/2019 1634 09/26/19 0827  AST 14* 14*  ALT 14 13  ALKPHOS 57 59  BILITOT 1.0 1.2  PROT 5.8* 7.1  ALBUMIN 2.3* 2.8*   No results for input(s): LIPASE, AMYLASE in the  last 168 hours. No results for input(s): AMMONIA in the last 168 hours.  ABG    Component Value Date/Time   HCO3 17.5 (L) 10/26/2007 1805   TCO2 19 10/26/2007 1805   ACIDBASEDEF 10.0 (H) 10/26/2007 1805     Coagulation Profile: No results for input(s): INR, PROTIME in the last 168 hours.  Cardiac Enzymes: No results for input(s): CKTOTAL, CKMB, CKMBINDEX, TROPONINI in the last 168 hours.  HbA1C: Hemoglobin A1C  Date/Time Value Ref Range Status  05/17/2018 03:09 PM 7.0 (A) 4.0 - 5.6 % Final  01/18/2018 02:04 PM 7.7  Final   Hgb A1c MFr Bld  Date/Time Value Ref Range Status  06/17/2018 07:55 PM 6.9 (H) 4.8 - 5.6 % Final    Comment:    (NOTE) Pre diabetes:          5.7%-6.4% Diabetes:              >6.4% Glycemic control for   <7.0% adults with diabetes   03/21/2011 06:00 PM  <5.7 % Final   5.6 (NOTE)  According to the ADA Clinical Practice Recommendations for 2011, when HbA1c is used as a screening test:   >=6.5%   Diagnostic of Diabetes Mellitus           (if abnormal result  is confirmed)  5.7-6.4%   Increased risk of developing Diabetes Mellitus  References:Diagnosis and Classification of Diabetes Mellitus,Diabetes [id number] 1):S62-S69 and Standards of Medical Care in         Diabetes - 2011,Diabetes [id number]  (Suppl 1):S11-S61.    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   Surgical History    Past Surgical History:  Procedure Laterality Date  . ABDOMINAL HYSTERECTOMY     and oopherectomy for fibroids  . BASCILIC VEIN TRANSPOSITION Left 01/29/2018   Procedure: RIGHT BRACIOCEPHALIC AV FISTULA;  Surgeon: Angelia Mould, MD;  Location: [place];  Service: Vascular;  Laterality: Left;  . CHOLECYSTECTOMY OPEN    . KIDNEY TRANSPLANT  2013     Social History   reports that she quit smoking about 22 years ago. Her smoking use included cigarettes. She has a 3.96 pack-year smoking history. She has never used smokeless tobacco. She reports previous alcohol use. She reports that she does not use drugs.   Family History   Her family history includes Cancer in her brother and father; Diabetes in her mother.   Allergies Allergies  Allergen Reactions  . Omeprazole Other (See Comments)    Interferes with the absorption of rilipivirine  . Eggs Or Egg-Derived Products Nausea And Vomiting  . Influenza Vaccines Other (See Comments)    Hallucination     Home Medications  Prior to Admission medications   Medication Sig Start Date End Date Taking? Authorizing Provider  amLODipine (NORVASC) 10 MG tablet TAKE 1 TABLET(10 MG) BY MOUTH DAILY Patient taking differently: Take 10 mg by mouth daily.  03/24/19  Yes Annia Belt, MD  aspirin 81 MG EC tablet Take 81 mg by mouth daily.     Yes [provider]  calcitRIOL (ROCALTROL) 0.25 MCG capsule Take 0.25 mcg by mouth daily. 05/06/18  Yes [provider]  carvedilol (COREG) 25 MG tablet Take 1 tablet (25 mg total) by mouth 2 (two) times daily with a meal. 07/17/19  Yes Oda Kilts, MD  cinacalcet (SENSIPAR) 60 MG tablet Take 60 mg by mouth daily.   Yes [provider]  dolutegravir (TIVICAY) 50 MG tablet Take 1 tablet (50 mg total) by mouth  daily. 11/05/18  Yes Tommy Medal, Lavell Islam, MD  FREESTYLE TEST STRIPS test strip Use to check blood sugar 4 times daily. diag code E11.9. insulin dependent 09/24/19  Yes Neva Seat, MD  furosemide (LASIX) 80 MG tablet Take 1 tablet (80 mg total) by mouth daily. 09/20/19  Yes Thurnell Lose, MD  Insulin Degludec-Liraglutide (XULTOPHY) 100-3.6 UNIT-MG/ML SOPN Inject 16 Units into the skin every morning. 07/17/19  Yes Oda Kilts, MD  Insulin Pen  Needle 32G X 4 MM MISC 16 Units by Does not apply route daily. 05/17/18  Yes Lorella Nimrod, MD  lamivudine (EPIVIR) 100 MG tablet Take 1 tablet (100 mg total) by mouth daily. 11/05/18  Yes Tommy Medal, Lavell Islam, MD  Lancets (FREESTYLE) lancets Use as instructed 05/17/18  Yes Lorella Nimrod, MD  loperamide (IMODIUM A-D) 2 MG tablet Take 1 tablet (2 mg total) by mouth 3 (three) times daily as needed for diarrhea or loose stools. 09/19/19  Yes Thurnell Lose, MD  magnesium oxide (MAG-OX) 400 MG tablet Take 400 mg by mouth daily.   Yes [provider]  mycophenolate (MYFORTIC) 180 MG EC tablet Take 540 mg by mouth 2 (two) times daily.   Yes [provider]  ondansetron (ZOFRAN) 4 MG tablet Take 1 tablet (4 mg total) by mouth every 8 (eight) hours as needed for nausea or vomiting. 09/19/19  Yes Thurnell Lose, MD  pantoprazole (PROTONIX) 20 MG tablet Take 1 tablet (20 mg total) by mouth daily. 07/17/19  Yes Oda Kilts, MD  potassium chloride SA (K-DUR,KLOR-CON) 20 MEQ tablet Take 20 mEq by mouth daily. 05/13/18  Yes [provider]  predniSONE (DELTASONE) 5 MG tablet Take 5 mg by mouth daily with breakfast.   Yes [provider]  rosuvastatin (CRESTOR) 20 MG tablet TAKE 1 TABLET(20 MG) BY MOUTH DAILY Patient taking differently: Take 20 mg by mouth daily.  07/30/19  Yes Sid Falcon, MD  sodium bicarbonate 650 MG tablet Take 1 tablet (650 mg total) by mouth 3 (three) times daily. 09/19/19  Yes Thurnell Lose, MD  tacrolimus (PROGRAF) 1 MG capsule Take 2-3 mg by mouth See admin instructions. Take 2 mg by mouth in the morning, then take 3 mg by mouth in the afternoon 10/01/13  Yes [provider]  VOLTAREN 1 % GEL APPLY 2 GRAMS EXTERNALLY TO THE AFFECTED AREA FOUR TIMES DAILY Patient taking differently: Apply 2 g topically daily as needed. For pain 04/16/19  Yes Lorella Nimrod, MD     Critical care time: n/a     Roselie Awkward, MD [facility] Pager: [phone] Cell: [phone] If no response, call [phone]

## 2019-09-26 NOTE — Progress Notes (Signed)
Pt is AMS. she is unable to follow command and is not swallowing the meds. She is drowsy. MD Shanon Brow paged. Awaiting response.

## 2019-09-26 NOTE — Progress Notes (Addendum)
1624: Spoke with patient daughter Lavella Lemons. Updated on patient condition..  1825: Page to MD. Patient has lesions on right vaginal area.

## 2019-09-26 NOTE — Evaluation (Signed)
Physical Therapy Evaluation Patient Details Name: [name] MRN: [id number] DOB: 04/28/1946 Today's Date: 09/26/2019   History of Present Illness   73 y.o. 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 d/c home 10/16  Clinical Impression   Pt re-admitted with above diagnosis. Pt has had significant decline in cognition since last seen at this facility. Today she unable to follow simple cues/instrcutions needing max and multimodal cues etc to complete simple tasks. She is also needing max a with all functional mobility. Pt currently with functional limitations due to the deficits listed below (see PT Problem List). Pt will benefit from skilled PT to increase their independence and safety with mobility to allow discharge to the venue listed below.       Follow Up Recommendations SNF    Equipment Recommendations       Recommendations for Other Services       Precautions / Restrictions Precautions Precautions: Fall Precaution Comments: watch sats with activity Restrictions Weight Bearing Restrictions: No      Mobility  Bed Mobility Overal bed mobility: Needs Assistance Bed Mobility: Supine to Sit     Supine to sit: Max assist   Sit to sidelying: Mod assist General bed mobility comments: unable to get legs back into bed without mod a, sit to supine with max a  Transfers Overall transfer level: Needs assistance Equipment used: 1 person hand held assist Transfers: Sit to/from Stand Sit to Stand: Max assist         General transfer comment: needs max a to stand and maintain stance, onec in standing able to stand approx 1 min the L knee buckled and almost fell  needing max a to correct.  Ambulation/Gait             General Gait Details: did not attempt ambulation this time sec to safety with L knee buckling  Stairs            Wheelchair Mobility    Modified Rankin (Stroke Patients Only)       Balance Overall balance assessment: Needs assistance Sitting-balance support: Feet supported Sitting balance-Leahy Scale: Fair     Standing balance support: Single extremity supported Standing balance-Leahy Scale: Fair                               Pertinent Vitals/Pain Pain Assessment: Faces Faces Pain Scale: Hurts little more Pain Descriptors / Indicators: Grimacing;Guarding;Moaning Pain Intervention(s): Limited activity within patient's tolerance;Monitored during session    Home Living Family/patient expects to be discharged to:: [place]: Children;Other relatives Available Help at Discharge: Other (Comment);Friend(s);Family Type of Home: Apartment Home Access: Level entry     Home Layout: One level Home Equipment: None      Prior Function Level of Independence: Independent               Hand Dominance   Dominant Hand: Right    Extremity/Trunk Assessment   Upper Extremity Assessment Upper Extremity Assessment: Defer to OT evaluation    Lower Extremity Assessment Lower Extremity Assessment: Generalized weakness    Cervical / Trunk Assessment Cervical / Trunk Assessment: Kyphotic  Communication   Communication: HOH;Expressive difficulties  Cognition Arousal/Alertness: Lethargic Behavior  During Therapy: Anxious;Restless Overall Cognitive Status: History of cognitive impairments - at baseline Area of Impairment: Orientation;Attention;Memory;Following commands;Safety/judgement;Awareness;Problem solving                 Orientation Level: Disoriented to;Place;Time;Situation   Memory: Decreased recall of precautions;Decreased short-term  memory Following Commands: Follows one step commands inconsistently;Follows one step commands with increased time Safety/Judgement: Decreased awareness of safety;Decreased awareness of deficits   Problem Solving: Slow processing;Decreased initiation;Difficulty sequencing;Requires verbal cues;Requires tactile cues General Comments: Pt is preoccupied with finding her shoes, therapist sugested maybe family had taken shoes home she seemed to calm down some. Pt is extremely confused and not as previously seen by this therapist, needs continued and multimodal cues and instructions to complete small tasks. She is again perceverating on being cold and shivering to emphasize point.      General Comments General comments (skin integrity, edema, etc.): Pt is more confused than previous and is barely able to follow cues/commands to complete simple tasks she was able to complete in past with minimal assistance. Pt is at max a with most mobility this am. Unable to initiate ambulation sec to safety concern, L knee buckled with standing from EOB max a to correct LOB. Pt is on room air sats remain in 80s and 90s, pt does not seem to be in any distress.    Exercises     Assessment/Plan    PT Assessment Patient needs continued PT services(while in hospital)  PT Problem List Decreased strength;Decreased mobility;Decreased safety awareness;Decreased coordination;Decreased knowledge of precautions;Decreased activity tolerance;Decreased cognition;Decreased balance       PT Treatment Interventions Therapeutic activities;Gait training;Therapeutic exercise;Patient/family education;Balance training;Functional mobility training;Neuromuscular re-education    PT Goals (Current goals can be found in the Care Plan section)  Acute Rehab PT Goals Patient Stated Goal: to get warm Time For Goal Achievement: 10/10/19 Potential to Achieve Goals: Fair    Frequency Min 2X/week   Barriers to discharge Decreased caregiver  support failed d/c home    Co-evaluation               AM-PAC PT "6 Clicks" Mobility  Outcome Measure Help needed turning from your back to your side while in a flat bed without using bedrails?: A Lot Help needed moving from lying on your back to sitting on the side of a flat bed without using bedrails?: A Lot Help needed moving to and from a bed to a chair (including a wheelchair)?: A Lot Help needed standing up from a chair using your arms (e.g., wheelchair or bedside chair)?: A Lot Help needed to walk in hospital room?: Total Help needed climbing 3-5 steps with a railing? : Total 6 Click Score: 10    End of Session   Activity Tolerance: Treatment limited secondary to medical complications (Comment);Patient limited by lethargy;Patient limited by fatigue Patient left: in bed;with call bell/phone within reach Nurse Communication: Mobility status;Other (comment)(change in cognition) PT Visit Diagnosis: Unsteadiness on feet (R26.81);Muscle weakness (generalized) (M62.81)    Time: [id number] PT Time Calculation (min) (ACUTE ONLY): 22 min   Charges:   PT Evaluation $PT Eval Moderate Complexity: 1 Mod PT Treatments $Therapeutic Activity: 8-22 mins        Horald Chestnut, PT   Delford Field 09/26/2019, 4:53 PM

## 2019-09-26 NOTE — Progress Notes (Signed)
Occupational Therapy Evaluation Patient Details Name: [name] MRN: [id number] DOB: 07/31/1946 Today's Date: 09/26/2019    History of Present Illness 73 y.o. 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 Patient was discharged home 10/16 last week from [facility] for COVID-19 viral gastroenteritis, AKI on CKD, and pneumonitis with acute hypoxic respiratory failure.  She was treated with a course of Rocephin, azithromycin as well as IV steroids and remdesivir.  Daughter at bedside reports that patient initially improved some since returning home.  However patient continued to have generalized weakness and unable to communicate more than a sentence each time which is unlike her baseline. Increase somnolent   Clinical Impression   PTA, pt was independent and lived at home alone. Since DC from [facility], she was assisted by her daughters. Pt with decline in function as compared to prior admission. PT lethargic and apparently confused during session, although level of arousal improved once upright and in chair. Pt will most likely need SNF for rehab to facilitate safe return home. If pt progresses, she would be appropriate for DC home with program [facility]. Will follow acutely to facilitate DC to next venue of care.     Follow Up Recommendations  SNF;Supervision/Assistance - 24 hour(pending progress)    Equipment Recommendations  3 in 1 bedside commode;Tub/shower seat    Recommendations for Other Services       Precautions / Restrictions Precautions Precautions: Fall;Other (comment) Restrictions Weight Bearing Restrictions: No      Mobility Bed Mobility Overal bed  mobility: Needs Assistance Bed Mobility: Supine to Sit     Supine to sit: Max assist        Transfers Overall transfer level: Needs assistance   Transfers: Sit to/from Stand;Stand Pivot Transfers Sit to Stand: Mod assist Stand pivot transfers: Mod assist;+2 physical assistance            Balance Overall balance assessment: Needs assistance   Sitting balance-Leahy Scale: Poor       Standing balance-Leahy Scale: Poor                             ADL either performed or assessed with clinical judgement   ADL Overall ADL's : Needs assistance/impaired Eating/Feeding: Maximal assistance   Grooming: Maximal assistance;Sitting   Upper Body Bathing: Maximal assistance;Sitting   Lower Body Bathing: Total assistance   Upper Body Dressing : Total assistance   Lower Body Dressing: Total assistance               Functional mobility during ADLs: Moderate assistance;+2 for physical assistance       Vision   Additional Comments: unsure     Perception     Praxis      Pertinent Vitals/Pain Pain Assessment: Faces Faces Pain Scale: Hurts little more Pain Location: abdomen/indigestion Pain Descriptors / Indicators: Grimacing;Guarding Pain Intervention(s): Limited activity within patient's tolerance;Patient requesting pain meds-RN notified     Hand Dominance Right   Extremity/Trunk Assessment Upper Extremity Assessment Upper Extremity Assessment: Generalized weakness   Lower Extremity Assessment Lower Extremity Assessment: Defer to PT evaluation   Cervical / Trunk Assessment Cervical / Trunk Assessment: Kyphotic   Communication Communication Communication: HOH;Expressive difficulties  Cognition Arousal/Alertness: Lethargic Behavior During Therapy: Anxious Overall Cognitive Status: No family/caregiver present to determine baseline cognitive functioning Area of Impairment: Orientation;Attention;Memory;Following  commands;Safety/judgement;Awareness;Problem solving                 Orientation Level: Disoriented to;Place;Time;Situation Current Attention Level: Focused Memory: Decreased short-term memory Following Commands: Follows one step commands inconsistently Safety/Judgement: Decreased awareness of safety;Decreased awareness of deficits Awareness: Intellectual Problem Solving: Slow processing;Decreased initiation;Difficulty sequencing;Requires verbal cues;Requires tactile cues     General Comments       Exercises     Shoulder Instructions      Home Living Family/patient expects to be discharged to:: Private residence Living Arrangements: Children;Other relatives Available Help at Discharge: Other (Comment);Friend(s);Family Type of Home: Apartment Home Access: Level entry     Home Layout: One level     Bathroom Shower/Tub: Teacher, early years/pre: Standard         Additional Comments: information from chart      Prior Functioning/Environment Level of Independence: Independent                 OT Problem List: Decreased strength;Decreased range of motion;Decreased activity tolerance;Impaired balance (sitting and/or standing);Decreased cognition;Decreased safety awareness;Decreased knowledge of use of DME or AE;Cardiopulmonary status limiting activity;Pain      OT Treatment/Interventions: Self-care/ADL training;Therapeutic exercise;Neuromuscular education;Energy conservation;DME and/or AE instruction;Therapeutic activities;Cognitive remediation/compensation;Patient/family education;Balance training    OT Goals(Current goals can be found in the care plan section) Acute Rehab OT Goals Patient Stated Goal: to get warm OT Goal Formulation: Patient unable to participate in goal setting Time For Goal Achievement: 10/10/19 Potential to Achieve Goals: Good  OT Frequency: Min 2X/week   Barriers to D/C:            Co-evaluation               AM-PAC OT "6 Clicks" Daily Activity     Outcome Measure Help from another person eating meals?: A Lot Help from another person taking care of personal grooming?: A Lot Help from another person toileting, which includes using toliet, bedpan, or urinal?: Total Help from another person bathing (including washing, rinsing, drying)?: A Lot Help from another person to put on and taking off regular upper body clothing?: A Lot Help from another person to put on and taking off regular lower body clothing?: Total 6 Click Score: 10   End of Session Equipment Utilized During Treatment: Gait belt Nurse Communication: Mobility status  Activity Tolerance: Patient limited by lethargy Patient left: in chair;with call bell/phone within reach;with chair alarm set  OT Visit Diagnosis: Unsteadiness on feet (R26.81);Other abnormalities of gait and mobility (R26.89);Muscle weakness (generalized) (M62.81);Other symptoms and signs involving cognitive function;Pain Pain - part of body: (abdomen)                Time: 1140-1210 OT Time Calculation (min): 30 min Charges:  OT General Charges $OT Visit: 1 Visit OT Evaluation $OT Eval Moderate Complexity: 1 Mod OT Treatments $Self Care/Home Management : 8-22 mins  Maurie Boettcher, OT/L   Acute OT Clinical Specialist [place] Pager [phone] Office [phone]   [facility] 09/26/2019, 12:23 PM

## 2019-09-26 NOTE — Progress Notes (Signed)
Internal Medicine Clinic Attending  Case discussed with Dr. Melvin  at the time of the visit.  We reviewed the resident's history and exam and pertinent patient test results.  I agree with the assessment, diagnosis, and plan of care documented in the resident's note.  

## 2019-09-26 NOTE — Progress Notes (Signed)
Pharmacy Antibiotic Note  [name] is a 74 y.o. female admitted on 09/28/2019 with AMS/SOB and PNA s/p recent admission for COVID-19 PNA.  Pharmacy has been consulted for Vancomycin and Cefepime dosing.  Plan: Vancomycin 1250 mg IV now, then 1 g IV q48h for est AUC  Cefepime 2 g IV q24h  Height: 5\' 3"  (160 cm) Weight: 145 lb (65.8 kg) IBW/kg (Calculated) : 52.4  Temp (24hrs), Avg:97.8 F (36.6 C), Min:97.8 F (36.6 C), Max:97.8 F (36.6 C)  Recent Labs  Lab 09/19/19 0815 09/29/2019 1634  WBC  --  6.5  CREATININE 4.60* 3.05*  LATICACIDVEN  --  1.9    Estimated Creatinine Clearance: 15.7 mL/min (A) (by C-G formula based on SCr of 3.05 mg/dL (H)).    Allergies  Allergen Reactions  . Omeprazole Other (See Comments)    Interferes with the absorption of rilipivirine  . Eggs Or Egg-Derived Products Nausea And Vomiting  . Influenza Vaccines Other (See Comments)    Hallucination    [name], Bronson Curb 09/26/2019 12:19 AM

## 2019-09-26 NOTE — Progress Notes (Signed)
Ok to increase her SQ heparin to 7500 units TID due to elevated ddimer per Dr. Sloan Leiter.   Onnie Boer, PharmD, BCIDP, AAHIVP, CPP Infectious Disease Pharmacist 09/26/2019 12:31 PM

## 2019-09-27 ENCOUNTER — Inpatient Hospital Stay (HOSPITAL_COMMUNITY): Payer: Medicare Other

## 2019-09-27 DIAGNOSIS — Z01818 Encounter for other preprocedural examination: Secondary | ICD-10-CM

## 2019-09-27 DIAGNOSIS — R4182 Altered mental status, unspecified: Secondary | ICD-10-CM

## 2019-09-27 DIAGNOSIS — U071 COVID-19: Secondary | ICD-10-CM | POA: Diagnosis not present

## 2019-09-27 DIAGNOSIS — R401 Stupor: Secondary | ICD-10-CM | POA: Diagnosis not present

## 2019-09-27 DIAGNOSIS — J1289 Other viral pneumonia: Secondary | ICD-10-CM | POA: Diagnosis not present

## 2019-09-27 LAB — CBC
HCT: 25.8 % — ABNORMAL LOW (ref 36.0–46.0)
Hemoglobin: 8.2 g/dL — ABNORMAL LOW (ref 12.0–15.0)
MCH: 27.9 pg (ref 26.0–34.0)
MCHC: 31.8 g/dL (ref 30.0–36.0)
MCV: 87.8 fL (ref 80.0–100.0)
Platelets: 136 10*3/uL — ABNORMAL LOW (ref 150–400)
RBC: 2.94 MIL/uL — ABNORMAL LOW (ref 3.87–5.11)
RDW: 15.2 % (ref 11.5–15.5)
WBC: 10.2 10*3/uL (ref 4.0–10.5)
nRBC: 0 % (ref 0.0–0.2)

## 2019-09-27 LAB — POCT I-STAT 7, (LYTES, BLD GAS, ICA,H+H)
Acid-base deficit: 3 mmol/L — ABNORMAL HIGH (ref 0.0–2.0)
Bicarbonate: 21.4 mmol/L (ref 20.0–28.0)
Calcium, Ion: 1.43 mmol/L — ABNORMAL HIGH (ref 1.15–1.40)
HCT: 22 % — ABNORMAL LOW (ref 36.0–46.0)
Hemoglobin: 7.5 g/dL — ABNORMAL LOW (ref 12.0–15.0)
O2 Saturation: 100 %
Patient temperature: 98.69
Potassium: 3.6 mmol/L (ref 3.5–5.1)
Sodium: 153 mmol/L — ABNORMAL HIGH (ref 135–145)
TCO2: 22 mmol/L (ref 22–32)
pCO2 arterial: 36.2 mmHg (ref 32.0–48.0)
pH, Arterial: 7.379 (ref 7.350–7.450)
pO2, Arterial: 424 mmHg — ABNORMAL HIGH (ref 83.0–108.0)

## 2019-09-27 LAB — BODY FLUID CELL COUNT WITH DIFFERENTIAL
Eos, Fluid: 0 %
Lymphs, Fluid: 92 %
Monocyte-Macrophage-Serous Fluid: 7 % — ABNORMAL LOW (ref 50–90)
Neutrophil Count, Fluid: 1 % (ref 0–25)
Total Nucleated Cell Count, Fluid: 2 cu mm (ref 0–1000)

## 2019-09-27 LAB — GLUCOSE, CAPILLARY
Glucose-Capillary: 281 mg/dL — ABNORMAL HIGH (ref 70–99)
Glucose-Capillary: 247 mg/dL — ABNORMAL HIGH (ref 70–99)
Glucose-Capillary: 232 mg/dL — ABNORMAL HIGH (ref 70–99)
Glucose-Capillary: 227 mg/dL — ABNORMAL HIGH (ref 70–99)
Glucose-Capillary: 140 mg/dL — ABNORMAL HIGH (ref 70–99)
Glucose-Capillary: 195 mg/dL — ABNORMAL HIGH (ref 70–99)
Glucose-Capillary: 224 mg/dL — ABNORMAL HIGH (ref 70–99)
Glucose-Capillary: 221 mg/dL — ABNORMAL HIGH (ref 70–99)
Glucose-Capillary: 233 mg/dL — ABNORMAL HIGH (ref 70–99)
Glucose-Capillary: 241 mg/dL — ABNORMAL HIGH (ref 70–99)
Glucose-Capillary: 204 mg/dL — ABNORMAL HIGH (ref 70–99)

## 2019-09-27 LAB — COMPREHENSIVE METABOLIC PANEL
ALT: 12 U/L (ref 0–44)
AST: 15 U/L (ref 15–41)
Albumin: 2.3 g/dL — ABNORMAL LOW (ref 3.5–5.0)
Alkaline Phosphatase: 46 U/L (ref 38–126)
Anion gap: 12 (ref 5–15)
BUN: 46 mg/dL — ABNORMAL HIGH (ref 8–23)
CO2: 16 mmol/L — ABNORMAL LOW (ref 22–32)
Calcium: 9.5 mg/dL (ref 8.9–10.3)
Chloride: 123 mmol/L — ABNORMAL HIGH (ref 98–111)
Creatinine, Ser: 2.64 mg/dL — ABNORMAL HIGH (ref 0.44–1.00)
GFR calc Af Amer: 20 mL/min — ABNORMAL LOW (ref >60)
GFR calc non Af Amer: 18 mL/min — ABNORMAL LOW (ref >60)
Glucose, Bld: 271 mg/dL — ABNORMAL HIGH (ref 70–99)
Potassium: 4.2 mmol/L (ref 3.5–5.1)
Sodium: 151 mmol/L — ABNORMAL HIGH (ref 135–145)
Total Bilirubin: 0.7 mg/dL (ref 0.3–1.2)
Total Protein: 5.8 g/dL — ABNORMAL LOW (ref 6.5–8.1)

## 2019-09-27 LAB — MAGNESIUM: Magnesium: 2 mg/dL (ref 1.7–2.4)

## 2019-09-27 LAB — BASIC METABOLIC PANEL
Anion gap: 8 (ref 5–15)
BUN: 44 mg/dL — ABNORMAL HIGH (ref 8–23)
CO2: 21 mmol/L — ABNORMAL LOW (ref 22–32)
Calcium: 9.5 mg/dL (ref 8.9–10.3)
Chloride: 123 mmol/L — ABNORMAL HIGH (ref 98–111)
Creatinine, Ser: 2.92 mg/dL — ABNORMAL HIGH (ref 0.44–1.00)
GFR calc Af Amer: 18 mL/min — ABNORMAL LOW (ref >60)
GFR calc non Af Amer: 16 mL/min — ABNORMAL LOW (ref >60)
Glucose, Bld: 197 mg/dL — ABNORMAL HIGH (ref 70–99)
Potassium: 3.7 mmol/L (ref 3.5–5.1)
Sodium: 152 mmol/L — ABNORMAL HIGH (ref 135–145)

## 2019-09-27 LAB — FERRITIN: Ferritin: 517 ng/mL — ABNORMAL HIGH (ref 11–307)

## 2019-09-27 LAB — CRYPTOCOCCAL ANTIGEN: Crypto Ag: NEGATIVE

## 2019-09-27 LAB — HEMOGLOBIN A1C
Hgb A1c MFr Bld: 7.7 % — ABNORMAL HIGH (ref 4.8–5.6)
Mean Plasma Glucose: 174.29 mg/dL

## 2019-09-27 LAB — PROCALCITONIN: Procalcitonin: 0.18 ng/mL

## 2019-09-27 LAB — BETA-HYDROXYBUTYRIC ACID: Beta-Hydroxybutyric Acid: 2.82 mmol/L — ABNORMAL HIGH (ref 0.05–0.27)

## 2019-09-27 LAB — AMMONIA: Ammonia: 21 umol/L (ref 9–35)

## 2019-09-27 LAB — C-REACTIVE PROTEIN: CRP: 2 mg/dL — ABNORMAL HIGH (ref <1.0)

## 2019-09-27 LAB — D-DIMER, QUANTITATIVE: D-Dimer, Quant: 3.73 ug/mL-FEU — ABNORMAL HIGH (ref 0.00–0.50)

## 2019-09-27 MED ORDER — ASPIRIN 81 MG PO CHEW
81.0000 mg | CHEWABLE_TABLET | Freq: Every day | ORAL | Status: DC
  Administered 2019-09-27 – 2019-10-04 (×8): 81 mg
  Filled 2019-09-27 (×8): qty 1

## 2019-09-27 MED ORDER — FREE WATER
200.0000 mL | Freq: Four times a day (QID) | Status: DC
  Administered 2019-09-27 – 2019-10-02 (×19): 200 mL

## 2019-09-27 MED ORDER — DEXTROSE-NACL 5-0.45 % IV SOLN: INTRAVENOUS | Status: DC

## 2019-09-27 MED ORDER — DEXMEDETOMIDINE HCL IN NACL 400 MCG/100ML IV SOLN
0.0000 ug/kg/h | INTRAVENOUS | Status: DC
  Administered 2019-09-27: 0.4 ug/kg/h via INTRAVENOUS

## 2019-09-27 MED ORDER — CARVEDILOL 12.5 MG PO TABS
12.5000 mg | ORAL_TABLET | Freq: Two times a day (BID) | ORAL | Status: DC
  Administered 2019-09-28 – 2019-10-02 (×5): 12.5 mg
  Filled 2019-09-27 (×13): qty 1

## 2019-09-27 MED ORDER — NONFORMULARY OR COMPOUNDED ITEM
540.0000 mg | Freq: Two times a day (BID) | Status: DC
  Administered 2019-09-27 – 2019-09-29 (×5): 540 mg
  Filled 2019-09-27 (×7): qty 1

## 2019-09-27 MED ORDER — PANTOPRAZOLE SODIUM 40 MG IV SOLR
40.0000 mg | INTRAVENOUS | Status: DC
  Administered 2019-09-27 – 2019-09-28 (×2): 40 mg via INTRAVENOUS
  Filled 2019-09-27 (×2): qty 40

## 2019-09-27 MED ORDER — SODIUM BICARBONATE 8.4 % IV SOLN
INTRAVENOUS | Status: DC
  Filled 2019-09-27: qty 50

## 2019-09-27 MED ORDER — INSULIN REGULAR BOLUS VIA INFUSION
0.0000 [IU] | Freq: Three times a day (TID) | INTRAVENOUS | Status: DC
  Filled 2019-09-27: qty 10

## 2019-09-27 MED ORDER — PHENYLEPHRINE HCL 0.25 % NA SOLN: 1.0000 | Freq: Four times a day (QID) | NASAL | Status: DC | PRN

## 2019-09-27 MED ORDER — CHLORHEXIDINE GLUCONATE CLOTH 2 % EX PADS
6.0000 | MEDICATED_PAD | Freq: Every day | CUTANEOUS | Status: DC
  Administered 2019-09-27 – 2019-10-06 (×9): 6 via TOPICAL

## 2019-09-27 MED ORDER — FENTANYL CITRATE (PF) 100 MCG/2ML IJ SOLN
25.0000 ug | INTRAMUSCULAR | Status: DC | PRN
  Administered 2019-09-27 – 2019-09-28 (×2): 100 ug via INTRAVENOUS
  Filled 2019-09-27 (×2): qty 2

## 2019-09-27 MED ORDER — MAGNESIUM OXIDE 400 (241.3 MG) MG PO TABS
400.0000 mg | ORAL_TABLET | Freq: Every day | ORAL | Status: DC
  Administered 2019-09-28 – 2019-10-04 (×7): 400 mg
  Filled 2019-09-27 (×11): qty 1

## 2019-09-27 MED ORDER — SODIUM CHLORIDE 0.9 % IV SOLN: INTRAVENOUS | Status: DC

## 2019-09-27 MED ORDER — AMLODIPINE BESYLATE 5 MG PO TABS
10.0000 mg | ORAL_TABLET | Freq: Every day | ORAL | Status: DC
  Administered 2019-09-29 – 2019-10-02 (×3): 10 mg
  Filled 2019-09-27 (×4): qty 2

## 2019-09-27 MED ORDER — MIDAZOLAM HCL 2 MG/2ML IJ SOLN
INTRAMUSCULAR | Status: AC
  Administered 2019-09-27: 1 mg
  Filled 2019-09-27: qty 2

## 2019-09-27 MED ORDER — TACROLIMUS 1 MG PO CAPS: 3.0000 mg | ORAL_CAPSULE | Freq: Every day | ORAL | Status: DC

## 2019-09-27 MED ORDER — BUTAMBEN-TETRACAINE-BENZOCAINE 2-2-14 % EX AERO: 1.0000 | INHALATION_SPRAY | Freq: Once | CUTANEOUS | Status: DC

## 2019-09-27 MED ORDER — CALCITRIOL 1 MCG/ML PO SOLN
0.2500 ug | Freq: Every day | ORAL | Status: DC
  Administered 2019-09-28 – 2019-10-04 (×7): 0.25 ug
  Filled 2019-09-27 (×10): qty 0.25

## 2019-09-27 MED ORDER — FENTANYL CITRATE (PF) 100 MCG/2ML IJ SOLN: 25.0000 ug | INTRAMUSCULAR | Status: DC | PRN

## 2019-09-27 MED ORDER — CLONIDINE HCL 0.2 MG/24HR TD PTWK
0.2000 mg | MEDICATED_PATCH | TRANSDERMAL | Status: DC
  Administered 2019-09-27: 0.2 mg via TRANSDERMAL
  Filled 2019-09-27: qty 1

## 2019-09-27 MED ORDER — SODIUM BICARBONATE 8.4 % IV SOLN
INTRAVENOUS | Status: DC
  Administered 2019-09-27: 14:00:00 via INTRAVENOUS
  Filled 2019-09-27 (×2): qty 100

## 2019-09-27 MED ORDER — ROCURONIUM BROMIDE 10 MG/ML (PF) SYRINGE
PREFILLED_SYRINGE | INTRAVENOUS | Status: AC
  Administered 2019-09-27: 70 mg
  Filled 2019-09-27: qty 10

## 2019-09-27 MED ORDER — ETOMIDATE 2 MG/ML IV SOLN
INTRAVENOUS | Status: AC
  Administered 2019-09-27: 12:00:00 20 mg
  Filled 2019-09-27: qty 10

## 2019-09-27 MED ORDER — FENTANYL CITRATE (PF) 100 MCG/2ML IJ SOLN
INTRAMUSCULAR | Status: AC
  Administered 2019-09-27: 12:00:00 25 ug
  Filled 2019-09-27: qty 2

## 2019-09-27 MED ORDER — LIDOCAINE HCL URETHRAL/MUCOSAL 2 % EX GEL: 1.0000 "application " | Freq: Once | CUTANEOUS | Status: DC

## 2019-09-27 MED ORDER — MYCOPHENOLATE 200 MG/ML ORAL SUSPENSION
540.0000 mg | Freq: Two times a day (BID) | ORAL | Status: DC
  Filled 2019-09-27 (×3): qty 10.8

## 2019-09-27 MED ORDER — DEXTROSE 5 % IV SOLN
INTRAVENOUS | Status: DC
  Administered 2019-09-27: 17:00:00 via INTRAVENOUS

## 2019-09-27 MED ORDER — ORAL CARE MOUTH RINSE
15.0000 mL | OROMUCOSAL | Status: DC
  Administered 2019-09-27 – 2019-09-28 (×8): 15 mL via OROMUCOSAL

## 2019-09-27 MED ORDER — METHYLPREDNISOLONE SODIUM SUCC 40 MG IJ SOLR
30.0000 mg | Freq: Every day | INTRAMUSCULAR | Status: DC
  Administered 2019-09-28 – 2019-10-04 (×7): 30 mg via INTRAVENOUS
  Filled 2019-09-27 (×7): qty 1

## 2019-09-27 MED ORDER — DEXTROSE-NACL 5-0.45 % IV SOLN
INTRAVENOUS | Status: DC
  Administered 2019-09-27: 09:00:00 via INTRAVENOUS

## 2019-09-27 MED ORDER — TACROLIMUS 1 MG/ML ORAL SUSPENSION
2.0000 mg | Freq: Every day | ORAL | Status: DC
  Administered 2019-09-28 – 2019-10-02 (×6): 2 mg via ORAL
  Filled 2019-09-27 (×7): qty 2

## 2019-09-27 MED ORDER — HYDRALAZINE HCL 25 MG PO TABS: 50.0000 mg | ORAL_TABLET | Freq: Three times a day (TID) | ORAL | Status: DC

## 2019-09-27 MED ORDER — CHLORHEXIDINE GLUCONATE 0.12% ORAL RINSE (MEDLINE KIT)
15.0000 mL | Freq: Two times a day (BID) | OROMUCOSAL | Status: DC
  Administered 2019-09-27 (×2): 15 mL via OROMUCOSAL

## 2019-09-27 MED ORDER — DEXTROSE 50 % IV SOLN
25.0000 mL | INTRAVENOUS | Status: DC | PRN
  Administered 2019-09-28: 01:00:00 14 mL via INTRAVENOUS
  Filled 2019-09-27: qty 50

## 2019-09-27 MED ORDER — HYDRALAZINE HCL 20 MG/ML IJ SOLN: 10.0000 mg | Freq: Four times a day (QID) | INTRAMUSCULAR | Status: DC | PRN

## 2019-09-27 MED ORDER — TACROLIMUS 1 MG PO CAPS: 2.0000 mg | ORAL_CAPSULE | Freq: Every day | ORAL | Status: DC

## 2019-09-27 MED ORDER — TACROLIMUS 1 MG/ML ORAL SUSPENSION
3.0000 mg | Freq: Every day | ORAL | Status: DC
  Administered 2019-09-27 – 2019-09-30 (×4): 3 mg via ORAL
  Filled 2019-09-27 (×5): qty 3

## 2019-09-27 MED ORDER — FAMOTIDINE 40 MG/5ML PO SUSR
20.0000 mg | Freq: Every day | ORAL | Status: DC
  Administered 2019-09-27: 20 mg
  Filled 2019-09-27: qty 2.5

## 2019-09-27 MED ORDER — GLUCERNA 1.2 CAL PO LIQD
1000.0000 mL | ORAL | Status: DC
  Administered 2019-09-27: 16:00:00 1000 mL
  Filled 2019-09-27: qty 1000

## 2019-09-27 MED ORDER — INSULIN REGULAR(HUMAN) IN NACL 100-0.9 UT/100ML-% IV SOLN
INTRAVENOUS | Status: DC
  Administered 2019-09-27: 2.2 [IU]/h via INTRAVENOUS
  Filled 2019-09-27 (×3): qty 100

## 2019-09-27 MED ORDER — ROSUVASTATIN CALCIUM 20 MG PO TABS
20.0000 mg | ORAL_TABLET | Freq: Every day | ORAL | Status: DC
  Administered 2019-09-28 – 2019-10-04 (×7): 20 mg
  Filled 2019-09-27 (×2): qty 1
  Filled 2019-09-27: qty 4
  Filled 2019-09-27 (×6): qty 1

## 2019-09-27 MED ORDER — LAMIVUDINE 10 MG/ML PO SOLN
100.0000 mg | Freq: Every day | ORAL | Status: DC
  Administered 2019-09-28 – 2019-10-04 (×7): 100 mg
  Filled 2019-09-27 (×12): qty 10

## 2019-09-27 MED ORDER — SODIUM BICARBONATE 650 MG PO TABS
650.0000 mg | ORAL_TABLET | Freq: Two times a day (BID) | ORAL | Status: DC
  Administered 2019-09-27 – 2019-10-04 (×14): 650 mg
  Filled 2019-09-27 (×17): qty 1

## 2019-09-27 NOTE — Consult Note (Signed)
NEURO HOSPITALIST CONSULT NOTE   Requestig physician: Dr. Candiss Norse  Reason for Consult: Encephalopathy  History obtained from:  Chart     HPI:                                                                                                                                          [name] is an 73 y.o. female with a PMHx of renal transplant, HIV on antiretrovirals, chronic hepatitis C and DM2 who had recently been hospitalized at [facility] from 10/9-10/16 for pneumonitis and gastroenteritis, treated at that time with steroids and remdesivir. There was some concern for possible tacrolimus toxicity during her recent prior hospitalization. She had been continued on this as well as her mycophenolate given renal transplant.   She was subsequently readmitted on 10/22 with lethargy, fatigue and increased WOB as well as confusion. She was found to have worsening infiltrates on chest x-ray and was subsequently transferred to the hospitalist service at [facility] for further evaluation and treatment. Her confusion continued to worsen, with CCM note from 10/23 documenting that the patient thought "she is in a church right now". Also yesterday afternoon, PT noted that patient had had significant decline in cognition since last seen at this facility, being unable to follow simple cues/instrcutions and needing max and multimodal cues to complete simple tasks.   MRI brain on 10/23 was technically limited due to extensive motion artifact, but showed no acute intracranial abnormality.   This morning, the patient was noted to be drowsy and unable to follow commands, in addition to not swallowing her medications.  by Dr. Candiss Norse to be lying in bed with no distress but visibly confused and unable to follow commands reliably. Occasional myoclonic jerks were also seen. Overall impression was that underlying metabolic derangement was the etiology, but seizure was also a consideration.   She was  seen by rapid response nurse this morning after discussion with Dr. Candiss Norse and CCM team in anticipation of clinical decompensation. She was transferred to the ICU and intubated at 12:10 PM with bronchoscopy performed by Dr. Lake Bells.  Neurology was consulted to assess for possible seizures as well as for underlying etiology for the patient's encephalopathy.   Past Medical History:  Diagnosis Date  . CHF (congestive heart failure) ([place])   . Chronic kidney disease    ESRD secondary to DM, started HD in 2008, Dr. Jimmy Footman is her nephrologist, received renal transplant in 2013, no longer on dialysis (06/17/2018)  . Dialysis patient [facility])    T Th Sat  . Early cataracts, bilateral   . GERD (gastroesophageal reflux disease)   . Hepatitis C    untreated. VL 3700000 in 2008; pt reports this has been treated" (06/17/2018)  . High cholesterol   . History  of blood transfusion 2013   "when I got the kidney" (06/17/2018)  . History of hepatitis C 08/04/2015  . HIV infection ([place]) 1998   Dx in 1998 in [place], she presented with PCP pneumonia at that time. Has been tried on multiple regimens by her PCP in [place] before./ She has been on current ART for years now. Moved to [facility] in 2008 and is following with Dr. Tommy Medal since then.   . Hypertension   . Influenza-like illness 05/27/2019  . 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 PONV (postoperative nausea and vomiting)   . Type II diabetes mellitus ([place])   . Wears glasses     Past Surgical History:  Procedure Laterality Date  . ABDOMINAL HYSTERECTOMY     and oopherectomy for fibroids  . BASCILIC VEIN TRANSPOSITION Left 01/29/2018   Procedure: RIGHT BRACIOCEPHALIC AV FISTULA;  Surgeon: Angelia Mould, MD;  Location: [place];  Service: Vascular;  Laterality: Left;  . CHOLECYSTECTOMY OPEN    . KIDNEY TRANSPLANT  2013    Family History  Problem Relation Age of Onset  . Diabetes Mother   . Cancer Father   . Cancer Brother               Social  History:  reports that she quit smoking about 22 years ago. Her smoking use included cigarettes. She has a 3.96 pack-year smoking history. She has never used smokeless tobacco. She reports previous alcohol use. She reports that she does not use drugs.  Allergies  Allergen Reactions  . Omeprazole Other (See Comments)    Interferes with the absorption of rilipivirine  . Eggs Or Egg-Derived Products Nausea And Vomiting  . Influenza Vaccines Other (See Comments)    Hallucination    MEDICATIONS:                                                                                                                     Scheduled: . [START ON 09/28/2019] amLODipine  10 mg Per Tube Daily  . aspirin  81 mg Per Tube Daily  . [START ON 09/28/2019] calcitRIOL  0.25 mcg Per Tube Daily  . carvedilol  12.5 mg Per Tube BID WC  . chlorhexidine gluconate (MEDLINE KIT)  15 mL Mouth Rinse BID  . Chlorhexidine Gluconate Cloth  6 each Topical Daily  . cinacalcet  60 mg Oral Daily  . dolutegravir  50 mg Oral Daily  . famotidine  20 mg Per Tube Daily  . free water  200 mL Per Tube Q6H  . heparin  7,500 Units Subcutaneous Q8H  . [START ON 09/28/2019] lamiVUDine  100 mg Per Tube Daily  . [START ON 09/28/2019] magnesium oxide  400 mg Per Tube Daily  . mouth rinse  15 mL Mouth Rinse 10 times per day  . [START ON 09/28/2019] methylPREDNISolone (SOLU-MEDROL) injection  30 mg Intravenous Daily  . Mycophenolate Mofetil oral suspension 180m/ml - 4.368m 540 mg Per Tube BID  . pantoprazole (PROTONIX) IV  40 mg Intravenous Q24H  . [START ON 09/28/2019] rosuvastatin  20 mg Per Tube Daily  . sodium bicarbonate  650 mg Per Tube BID  . [START ON 09/28/2019] tacrolimus  2 mg Oral Daily  . tacrolimus  3 mg Oral QHS   Continuous: . ceFEPime (MAXIPIME) IV 2 g (09/27/19 2143)  . dexmedetomidine (PRECEDEX) IV infusion 0.7 mcg/kg/hr (09/27/19 1400)  . dextrose 50 mL/hr at 09/27/19 2000  . feeding supplement (GLUCERNA 1.2 CAL)    .  insulin 5 Units/hr (09/27/19 1338)  . [START ON 09/28/2019] vancomycin       ROS:                                                                                                                                       Unable to obtain due to AMS   Blood pressure (!) 148/71, pulse 89, temperature 98.6 F (37 C), temperature source Oral, resp. rate (!) 24, height 5' 3"  (1.6 m), weight 64.2 kg, SpO2 100 %.   General Examination:                                                                                                      Physical Exam  HEENT-  Normocephalic Lungs: Intubated  Neurological Examination:  Performed via video link with assistance or RN on site Mental Status: Lethargic to obtunded. Intubated off sedation. Will not follow verbal commands. Turns head away from threat. Closes eyes tightly when RN attempts to open. Semipurposeful movements.  Cranial Nerves: PERRL at 3 mm. Blinks to threat. Does not fixate or track. Reacts to touch bilaterally. Face grossly symmetric when grimacing to noxious. Cough reflex intact.  Motor/Sensory: Moves BLE equally to noxious but not antigravity.  No movement of LUE or RUE to noxious applied to distal upper extremities.  With nipple twisting, there is slight movement of RUE, but none on the left.   Deep Tendon Reflexes: Unelicitable Cerebellar: Unable to assess Gait: Unable to assess   Lab Results: Basic Metabolic Panel: Recent Labs  Lab 09/15/2019 1634 09/26/19 0827 09/27/19 0334  NA 149* 148* 151*  K 4.3 4.7 4.2  CL 120* 122* 123*  CO2 14* 10* 16*  GLUCOSE 346* 349* 271*  BUN 39* 42* 46*  CREATININE 3.05* 2.75* 2.64*  CALCIUM 9.4 9.6 9.5    CBC: Recent Labs  Lab 09/08/2019 1634 09/26/19 0827 09/27/19 0334  WBC 6.5 6.6 10.2  NEUTROABS 5.4  --   --   HGB 9.9*  10.4* 8.2*  HCT 31.6* 32.8* 25.8*  MCV 92.1 89.1 87.8  PLT 162 147* 136*    Cardiac Enzymes: No results for input(s): CKTOTAL, CKMB, CKMBINDEX, TROPONINI in the  last 168 hours.  Lipid Panel: No results for input(s): CHOL, TRIG, HDL, CHOLHDL, VLDL, LDLCALC in the last 168 hours.  Imaging: Dg Abd 1 View  Result Date: 09/27/2019 CLINICAL DATA:  Orogastric tube placement EXAM: ABDOMEN - 1 VIEW COMPARISON:  None. FINDINGS: An enteric tube terminates in the stomach. There is a nonobstructive bowel gas pattern. IMPRESSION: Enteric tube terminates in the stomach. Electronically Signed   By: Zerita Boers M.D.   On: 09/27/2019 12:56   Ct Head Wo Contrast  Result Date: 10/02/2019 CLINICAL DATA:  Head trauma worsening mental status EXAM: CT HEAD WITHOUT CONTRAST TECHNIQUE: Contiguous axial images were obtained from the base of the skull through the vertex without intravenous contrast. COMPARISON:  MRI 01/29/2017 FINDINGS: Brain: No acute territorial infarction, hemorrhage or intracranial mass. The ventricles are nonenlarged. Vascular: No hyperdense vessels.  Carotid vascular calcification Skull: Normal. Negative for fracture or focal lesion. Sinuses/Orbits: Mucosal thickening in the ethmoid sinuses Other: None IMPRESSION: Negative non contrasted CT appearance of the brain Electronically Signed   By: Donavan Foil M.D.   On: 10/02/2019 22:29   Ct Chest Wo Contrast  Result Date: 09/26/2019 CLINICAL DATA:  Pneumonia. EXAM: CT CHEST WITHOUT CONTRAST TECHNIQUE: Multidetector CT imaging of the chest was performed following the standard protocol without IV contrast. COMPARISON:  September 25, 2019. FINDINGS: Cardiovascular: Atherosclerosis of thoracic aorta is noted without aneurysm formation. Normal cardiac size. No pericardial effusion. Mediastinum/Nodes: Small sliding-type hiatal hernia is noted. No definite adenopathy is noted, although imaging is limited due to the lack of intravenous contrast. Mild bilateral thyroid gland enlargement is noted which is somewhat heterogeneous. Lungs/Pleura: No pneumothorax is noted. Small bilateral pleural effusions are noted. Multifocal  airspace opacities are noted throughout both lungs, most prominently in the peripheral regions, most consistent with multifocal pneumonia, potentially of atypical or viral etiology. Upper Abdomen: Bilateral renal atrophy is noted consistent with history of end-stage renal disease. Musculoskeletal: No chest wall mass or suspicious bone lesions identified. IMPRESSION: Multiple airspace opacities are noted bilaterally and most prominently in the peripheral regions, most consistent with multifocal pneumonia, most likely of atypical or viral etiology. Small bilateral pleural effusions are noted. Small sliding-type hiatal hernia. Aortic Atherosclerosis (ICD10-I70.0). Electronically Signed   By: Marijo Conception M.D.   On: 09/26/2019 15:41   Mr Brain Wo Contrast  Result Date: 09/26/2019 CLINICAL DATA:  Initial evaluation for acute encephalopathy. EXAM: MRI HEAD WITHOUT CONTRAST TECHNIQUE: Multiplanar, multiecho pulse sequences of the brain and surrounding structures were obtained without intravenous contrast. COMPARISON:  Prior head CT from 09/16/2019. FINDINGS: Brain: Examination moderately to severely degraded by motion artifact. Generalized age appropriate cerebral atrophy. No focal parenchymal signal abnormality or significant cerebral white matter changes seen on this motion degraded exam. No abnormal foci of restricted diffusion to suggest acute or subacute ischemia. Gray-white matter differentiation maintained. No encephalomalacia to suggest chronic cortical infarction. No foci of susceptibility artifact to suggest acute or chronic intracranial hemorrhage. No mass lesion, midline shift or mass effect. No hydrocephalus. No extra-axial fluid collection. Vascular: Major intracranial vascular flow voids maintained. Skull and upper cervical spine: Craniocervical junction grossly within normal limits. Bone marrow signal intensity normal. No scalp soft tissue abnormality. Sinuses/Orbits: Globes and orbital soft tissues  within normal limits. Paranasal sinuses are largely clear. No significant mastoid  effusion. Other: None. IMPRESSION: 1. Technically limited exam due to extensive motion artifact. 2. Grossly normal brain MRI. No acute intracranial abnormality identified. Electronically Signed   By: Jeannine Boga M.D.   On: 09/26/2019 23:22   Dg Chest Port 1 View  Result Date: 09/27/2019 CLINICAL DATA:  Endotracheal tube placement EXAM: PORTABLE CHEST 1 VIEW COMPARISON:  Chest radiograph dated 09/14/2019 and CT chest dated 09/26/2019 FINDINGS: There has been interval placement of an endotracheal tube which terminates 3.5 cm from the carina. An enteric tube enters the stomach and terminates below the field of view. The cardiac and mediastinal contours are not significantly changed. Bilateral peripheral predominant airspace opacities are not significantly changed. Small bilateral pleural effusions likely contribute. There is no pneumothorax. IMPRESSION: 1. Endotracheal tube appears satisfactorily positioned. 2. No significant interval change in the bilateral peripheral predominant airspace opacities. Small bilateral pleural effusions likely contribute. Electronically Signed   By: Zerita Boers M.D.   On: 09/27/2019 12:50   Dg Chest Portable 1 View  Result Date: 09/10/2019 CLINICAL DATA:  Recent change in mental status. Known COVID positive. EXAM: PORTABLE CHEST 1 VIEW COMPARISON:  09/12/2019 FINDINGS: Worsening of bilateral opacities with multifocal areas of nodular opacity and peripheral changes. Also with left lower lobe consolidation since prior study. Cardiomediastinal contours are enlarged. No acute bone process. IMPRESSION: Worsening of multifocal pneumonia in the setting of known COVID infection. No other interval changes. Electronically Signed   By: Zetta Bills M.D.   On: 09/05/2019 15:10    Assessment: 73 year old female with acute encephalopathy and myoclonus in the setting of multifocal pneumonia  secondary to Covid infection.  1. Exam reveals most consistent with an encephalopathy. DDx includes toxic, metabolic, infectious, seizure-related and Covid encephalopathy.  2. LUE no movement to noxious. RUE with minimal movement as well as decreased movement of BLE. MRI brain negative for acute abnormality.   3. Given lack of fever and white count as well as no neck stiffness, meningitis is felt to be unlikely.  4. Also consider possible EtOH or benzodiazepine withdrawal, as well as acute thiamine deficiency 5. Ammonia, AST, ALT normal.  6. RPR was reactive on 6/3. No mention of this in today's progress notes. Would look through records to see if the patient has been treated for syphilis. Could not find an FTA-ABS result in Epic. May need ID consult.  7. Last TSH was in July of 2019.  8. Last vitamin B12 level ws in 2015 9. HIV positive.    Recommendations: 1. MRI cervical spine.  2. EEG (ordered) 3. Toxic/metabolic work up.  4. On antibiotic regimen for HCAP 5. Continue CIWA protocol 6. Empiric thiamine: 250 mg IV TID x 3 days, then 100 mg po thereafter 7. May need ID consult and FTA-ABS if chart review does not reveal additional information regarding follow up for the positive RPR on 6/3.  8. TSH level 9. B12 level  Electronically signed: Dr. Kerney Elbe 09/27/2019, 1:45 PM

## 2019-09-27 NOTE — Progress Notes (Signed)
Per CCM MD 10 cc of normal saline flushed down ETT with copious return of clear fluid.  Patient tolerated fairly well.

## 2019-09-27 NOTE — Progress Notes (Signed)
Pt bronched post intubation. BAL collected with copious amounts of clear/white thin sent to lab.

## 2019-09-27 NOTE — Progress Notes (Signed)
Patient seen by rapid response RN this morning after discussion with Dr. Candiss Norse and CCM team in anticipation of clinical decompensation. Patient transferred to ICU. Patient intubated at 1210pm by Dr. Lake Bells. 1 versed, 25 fentanyl, 6 rocuronium administered. Bronchoscopy immediately performed by Dr. Lake Bells. Patient currently stable.

## 2019-09-27 NOTE — Progress Notes (Signed)
[facility]  Discussed changed in status with the patient's daughter and explained that her mental status has declined.  I explained that the best approach for performing a bronchoscopy would be to intubate her first.  They understand she may not come off the ventilator but they prefer this approach to watchful waiting.   Roselie Awkward, MD [facility] Pager: [phone] Cell: [phone] If no response, call [phone]

## 2019-09-27 NOTE — Progress Notes (Signed)
NAME:  [name], MRN:  [id number], DOB:  Apr 10, 1946, LOS: 1 ADMISSION DATE:  09/12/2019, CONSULTATION DATE:  10/23 REFERRING MD:  Sloan Leiter, CHIEF COMPLAINT:  Dyspnea   Brief History   73 y/o female admitted on 10/22 to [facility] for lethargy and pulmonary new pulmonary infiltrates that developed one week after she was treated for COVID pneumonia.   Past Medical History  DM2 PCP pneumonia 1998 HTN HIV Hep C GERD ESRD s/p renal transplant CHF > LV normal, moderate concentric LVH, LVEF 60-65%, left atrium moderately dilated  Significant Hospital Events   10/22 admission 10/24 moved to ICU for declining mental status  Consults:  Pulmonary   Procedures:    Significant Diagnostic Tests:  10/23 CT chest images personally reviewed> patchy and nodular consolidation bilaterally predominantly in upper lobes and periphery but upper lobe predeominant bronchovascular distribution some patchy ggo in bases with pleural effusions bilaterally, ? Cavitary lesion RLL 10/22 CT head > NAICP 10/24 MRI Brain > no acute findings, motion artifact  Micro Data:  10/23 respiratory viral panel > negative 10/24 BAL bacterial >  10/24 BAL fungal >  10/24 BAL AFB >  10/24 BAL aspergillus antigen >  10/24 BAL PCP >    Antimicrobials:  10/22 vanc >  10/22 cefepime >   Interim history/subjective:  Worsening mental status  Moved to ICU  Objective   Blood pressure (!) 96/29, pulse (!) 57, temperature 98.6 F (37 C), temperature source Oral, resp. rate 20, height _0  (1.6 m), weight 64.2 kg, SpO2 100 %.    Vent Mode: PRVC FiO2 (%):  [100 %] 100 % Set Rate:  [16 bmp] 16 bmp Vt Set:  [400 mL] 400 mL PEEP:  [5 cmH20] 5 cmH20 Plateau Pressure:  [20 cmH20] 20 cmH20   Intake/Output Summary (Last 24 hours) at 09/27/2019 1352 Last data filed at 09/27/2019 0400 Gross per 24 hour  Intake 1215.4 ml  Output 600 ml  Net 615.4 ml   Filed Weights   09/15/2019 1421 09/26/19 0520  Weight: 65.8 kg  64.2 kg    Examination:  General:  Resting comfortably in bed HENT: NCAT OP clear PULM: CTA B, normal effort CV: RRR, no mgr GI: BS+, soft, nontender MSK: normal bulk and tone Neuro: confused, myoclonus of facial muscles, non verbal today, not following commands, moans, eyes open to voice, GCS 13    Resolved Hospital Problem list     Assessment & Plan:  Acute encephalopathy, most likely toxic metabolic, MRI brain negative, with myoclonus Agree with EEG Likely needs neurology consult  Inability to protect airway: Intubate Full mechanical ventilator support Minimal sedation Daily SBT/WUA  Bilateral infiltrates in immunocompromised host who has recovered from COVID: unclear etiology, see discussion from 10/23 note Bronchoscopy, send for cell count, diff and infectious studies Agree with HCAP coverage with vanc/cefepime  Renal transplant/CKD Monitor BMET and UOP Replace electrolytes as needed  Need for sedation: RASS target 0 to -1 precedex Prn fentanyl Avoid benzo  DKA: Treatment per [facility]  Best practice:  Diet: NPO after midnight Pain/Anxiety/Delirium protocol (if indicated): n/a VAP protocol (if indicated): n/a DVT prophylaxis: sub q heparin GI prophylaxis: n/a Glucose control: per TRH Mobility: bed rest Code Status: full Family Communication: updated family by phone today before the bronchoscopy and intubation and then updated her after those procedures as well Disposition: move to ICU  Labs   CBC: Recent Labs  Lab 09/19/2019 1634 09/26/19 0827 09/27/19 0334  WBC 6.5 6.6 10.2  NEUTROABS  5.4  --   --   HGB 9.9* 10.4* 8.2*  HCT 31.6* 32.8* 25.8*  MCV 92.1 89.1 87.8  PLT 162 147* 136*    Basic Metabolic Panel: Recent Labs  Lab 09/19/2019 1634 09/26/19 0827 09/27/19 0334  NA 149* 148* 151*  K 4.3 4.7 4.2  CL 120* 122* 123*  CO2 14* 10* 16*  GLUCOSE 346* 349* 271*  BUN 39* 42* 46*  CREATININE 3.05* 2.75* 2.64*  CALCIUM 9.4 9.6 9.5   GFR:  Estimated Creatinine Clearance: 17.9 mL/min (A) (by C-G formula based on SCr of 2.64 mg/dL (H)). Recent Labs  Lab 09/08/2019 1634 09/26/19 0827 09/27/19 0334 09/27/19 0500  PROCALCITON  --  0.18  --  0.18  WBC 6.5 6.6 10.2  --   LATICACIDVEN 1.9  --   --   --     Liver Function Tests: Recent Labs  Lab 09/04/2019 1634 09/26/19 0827 09/27/19 0334  AST 14* 14* 15  ALT _0 ALKPHOS 57 59 46  BILITOT 1.0 1.2 0.7  PROT 5.8* 7.1 5.8*  ALBUMIN 2.3* 2.8* 2.3*   No results for input(s): LIPASE, AMYLASE in the last 168 hours. Recent Labs  Lab 09/27/19 0334  AMMONIA 21    ABG    Component Value Date/Time   HCO3 17.5 (L) 10/26/2007 1805   TCO2 19 10/26/2007 1805   ACIDBASEDEF 10.0 (H) 10/26/2007 1805     Coagulation Profile: No results for input(s): INR, PROTIME in the last 168 hours.  Cardiac Enzymes: No results for input(s): CKTOTAL, CKMB, CKMBINDEX, TROPONINI in the last 168 hours.  HbA1C: Hemoglobin A1C  Date/Time Value Ref Range Status  05/17/2018 03:09 PM 7.0 (A) 4.0 - 5.6 % Final  01/18/2018 02:04 PM 7.7  Final   Hgb A1c MFr Bld  Date/Time Value Ref Range Status  06/17/2018 07:55 PM 6.9 (H) 4.8 - 5.6 % Final    Comment:    (NOTE) Pre diabetes:          5.7%-6.4% Diabetes:              >6.4% Glycemic control for   <7.0% adults with diabetes   03/21/2011 06:00 PM  <5.7 % Final   5.6 (NOTE)                                                                       According to the ADA Clinical Practice Recommendations for 2011, when HbA1c is used as a screening test:   >=6.5%   Diagnostic of Diabetes Mellitus           (if abnormal result  is confirmed)  5.7-6.4%   Increased risk of developing Diabetes Mellitus  References:Diagnosis and Classification of Diabetes Mellitus,Diabetes PYKD,9833,82(NKNLZ 1):S62-S69 and Standards of Medical Care in         Diabetes - 2011,Diabetes Care,2011,34  (Suppl 1):S11-S61.    CBG: Recent Labs  Lab 09/26/19 2139 09/27/19  0759 09/27/19 1128 09/27/19 1243 09/27/19 1337  GLUCAP 181* 281* 247* 232* 227*     Critical care time: 45 minutes     Roselie Awkward, MD [facility] Pager: [phone] Cell: [phone] If no response, call [phone]

## 2019-09-27 NOTE — Procedures (Signed)
PCCM Video Bronchoscopy Procedure Note  The patient was informed of the risks (including but not limited to bleeding, infection, respiratory failure, lung injury, tooth/oral injury) and benefits of the procedure and gave consent, see chart.  Indication: Bilateral infiltrates in immunocompromised host  Post Procedure Diagnosis: same  Location: [facility]  Condition pre procedure: critically ill  Medications for procedure: versed 54m, fentanyl 258m, etomidate 2063mProcedure description: The bronchoscope was introduced through the endotracheal tube and passed to the bilateral lungs to the level of the subsegmental bronchi throughout the tracheobronchial tree.  Airway exam revealed that the ETT was in the right mainstem, we repositioned it above the carina.  The carina was sharp, the bilateral tracheobronchial trees were without evidence of mass or inflammation.  Minimal secretions.  Procedures performed: BAL anterior segment of RUL  Specimens sent: BAL bacterial, fungal, AFB, galactomannan, cell count with diff and PJP testing  Condition post procedure: critically ill, on vent  EBL: none  Complications: none  BreRoselie AwkwardD [place]CM Pager: [phone]: [phone] no response, call [phone]

## 2019-09-27 NOTE — Procedures (Signed)
Intubation Procedure Note [name] [id number] Aug 13, 1946  Procedure: Intubation Indications: Airway protection and maintenance  Procedure Details Consent: Risks of procedure as well as the alternatives and risks of each were explained to the (patient/caregiver).  Consent for procedure obtained. Time Out: Verified patient identification, verified procedure, site/side was marked, verified correct patient position, special equipment/implants available, medications/allergies/relevent history reviewed, required imaging and test results available.  Performed  Drugs Etomidate 95m versed 257mfentanyl 5046mRocuronium 26m40m x 1 with MAC 3 blade Grade 1 view 7.5 ET tube passed through cords under direct visualization Placement confirmed with bilateral breath sounds, positive EtCO2 change and smoke in tube   Evaluation Hemodynamic Status: BP stable throughout; O2 sats: stable throughout Patient's Current Condition: stable Complications: No apparent complications Patient did tolerate procedure well. Chest X-ray ordered to verify placement.  CXR: pending.   DougSimonne Maffucci24/2020

## 2019-09-27 NOTE — Progress Notes (Addendum)
PROGRESS NOTE                                                                                                                                                                                                             Patient Demographics:    [name], is a 73 y.o. female, DOB - May 24, 1946, [id number]  Outpatient Primary MD for the patient is Earlene Plater, MD   Admit date - 09/07/2019   LOS - 1  Chief Complaint  Patient presents with   Altered Mental Status   Shortness of Breath   Nausea       Brief Narrative: Patient is a 73 y.o. female with PMHx of renal transplantation with chronic kidney disease stage IV, HIV on antiretrovirals, chronic hepatitis C, insulin-dependent DM-2-recently hospitalized at [facility] from 10/9-10/16 for teen gastroenteritis and pneumonitis associated with AKI-read by her PCP to the emergency room on 10/22 presented back to the ED on 10/22- with   weakness, lethargy & somnolence.  Upon further evaluation in the emergency room-she was found to have worsening infiltrates on chest x-ray, she was subsequently transferred to the hospitalist service at [facility] for further evaluation and treatment.   Subjective:   Patient lying in bed in no distress but visibly confused, unable to follow commands reliably, denies any headache or chest pain.  No shortness of breath.   Assessment  & Plan :   Pneumonia: Completed a course of remdesivir last admission-has worsening infiltrates on chest x-ray-severely immunocompromised-given history of HIV and immunosuppression for renal transplantation-hence differential is broad-but her inflammatory markers are not terribly elevated-hence do not think this is ongoing Covid pneumonia.  Thankfully she is not hypoxic-procalcitonin is not elevated.  She was adequately treated for COVID-19 pneumonia recently.  CT chest was done and  results were reviewed by PCCM, PCCM was consulted, per PCCM currently on IV cefepime and vancomycin.  Bronchoscopy to be done 09/27/2019.  Patient if her CT findings are due to her COVID-19 infection versus opportunistic infection in the light of her immunocompromise status.           COVID-19 Labs: Recent Labs    09/26/19 0827 09/27/19 0334  DDIMER 9.32* 3.73*  FERRITIN  --  517*  LDH 293*  --   CRP 3.7* 2.0*    Lab Results  Component  Value Date   SARSCOV2NAA POSITIVE (A) 09/12/2019   [place] Not Detected 05/09/2019     COVID-19 Medications: Steroids:10/22>> Remdesivir:10/10>>10/14   Prone/Incentive Spirometry:  incentive spirometry use 3-4/hour.  DVT Prophylaxis  :  Heparin (given elevated D-dimer-we will change to higher prophylactic dose)  Acute metabolic encephalopathy with occasional myoclonic jerk question if due to underlying metabolic acidosis: This likely is due to her severe metabolic derangement, I question if she is in DKA, extremely elevated beta hydroxybutyrate acid.  With low bicarbonate levels.  Placed on IV insulin drip along with Lantus.  Will monitor her electrolytes closely.  MRI brain nonacute.  She will also receive IV fluids as she appears quite dehydrated ( D5 with 3 amps Bicarb) .  Will get EEG & consult nephrology as well. DW Dr Grayland Ormond - 09/27/19.  Will also discuss with neurology on 09/27/2019.  Have paged Dr. Cheral Marker.  Note patient had metabolic acidosis last admission which was thought to be due to combination of her diarrhea and underlying renal dysfunction, this was discussed with nephrology twice last admission.  Her bicarb eventually started to improve with a normal anion gap at the time of discharge while she was on oral supplementation, she was given oral sodium bicarb prescriptions upon discharge which the daughter did not refill, according to the daughter she did not think these were essential prescriptions as she told me on 09/27/2019 over  the phone.  Kindly also see the note by patient's PCP 1 day before this admission.   DM-2 with hyperglycemia: With elevated beta hydroxybutyrate, question if she is in relative euglycemic DKA with sugars around 300.  Insulin drip for now monitor electrolytes closely.  Hypernatremia: Due to dehydration treat with D5 half-normal and monitor.  Hold any further diuretics.  She is on Lasix at baseline.  Metabolic acidosis: I think this is due to combination of her underlying CKD 4 along with possible DKA.  Oral bicarbonate to be continued if she can tolerate, treatment for DKA and monitor.Marland Kitchen  HIV (CD4 count on 05/07/2019 214): Spoke with pharmacy-ART dosage appears appropriate.  History of renal transplantation with CKD stage IV: Continue Prograf, mycophenolate-on IV steroids.  Obtain Prograf level tomorrow morning.  HTN: Stable-continue Norvasc-Coreg-hold Lasix  Dyslipidemia: Continue Crestor  GERD: Continue PPI  Failure to thrive/deconditioning/debility: Obtain PT/OT and nutrition eval.  May require SNF.  She is incredibly frail and deconditioned.  Note she refused SNF placement last admission.  Once she is better will talk again about SNF.     Condition - -very tenuous with risk for further deterioration  Family Communication  :  Daughter Lavella Lemons updated over the phone by me again on 09/27/2019.  Code Status :  Full Code  Diet :  Diet Order            Diet NPO time specified  Diet effective midnight               Disposition Plan  :  Remain hospitalized  Barriers to discharge: Treatment for pneumonia with IV antibiotics  Consults  : CCM, nephrology, neurology  Procedures  :    EEG.  Pending   Bronchoscopy 09/27/2019.    CT chest.  Severe atypical pneumonia.    MRI brain.  Nonacute.  Antibiotics  :    Anti-infectives (From admission, onward)   Start     Dose/Rate Route Frequency Ordered Stop   09/28/19 0600  vancomycin (VANCOCIN) IVPB 1000 mg/200 mL premix      1,000 mg  200 mL/hr over 60 Minutes Intravenous Every 48 hours 09/26/19 0024     09/26/19 1000  dolutegravir (TIVICAY) tablet 50 mg     50 mg Oral Daily 09/26/19 0024     09/26/19 1000  lamiVUDine (EPIVIR) 10 MG/ML solution 100 mg     100 mg Oral Daily 09/26/19 0024     09/26/19 0030  vancomycin (VANCOCIN) 1,250 mg in sodium chloride 0.9 % 250 mL IVPB     1,250 mg 166.7 mL/hr over 90 Minutes Intravenous  Once 09/26/19 0024 09/26/19 0404   09/26/19 0030  ceFEPIme (MAXIPIME) 2 g in sodium chloride 0.9 % 100 mL IVPB     2 g 200 mL/hr over 30 Minutes Intravenous Daily at bedtime 09/26/19 0024       DVT Prophylaxis  :  Heparin (given elevated D-dimer-we will change to higher prophylactic dose)  Inpatient Medications  Scheduled Meds:  amLODipine  10 mg Oral Daily   aspirin EC  81 mg Oral Daily   calcitRIOL  0.25 mcg Oral Daily   carvedilol  25 mg Oral BID WC   chlorhexidine  15 mL Mouth Rinse BID   cinacalcet  60 mg Oral Daily   cloNIDine  0.2 mg Transdermal Weekly   dolutegravir  50 mg Oral Daily   heparin  7,500 Units Subcutaneous Q8H   hydrALAZINE  50 mg Oral Q8H   insulin glargine  20 Units Subcutaneous QHS   insulin regular  0-10 Units Intravenous TID WC   lamiVUDine  100 mg Oral Daily   magnesium oxide  400 mg Oral Daily   mouth rinse  15 mL Mouth Rinse q12n4p   [START ON 09/28/2019] methylPREDNISolone (SOLU-MEDROL) injection  30 mg Intravenous Daily   mycophenolate  540 mg Oral BID   pantoprazole  40 mg Oral Daily   rosuvastatin  20 mg Oral Daily   sodium bicarbonate  650 mg Oral TID   tacrolimus  2 mg Oral Daily   tacrolimus  3 mg Oral QHS   Continuous Infusions:  ceFEPime (MAXIPIME) IV Stopped (09/26/19 2320)   dextrose 5 % and 0.45% NaCl 100 mL/hr at 09/27/19 0915   insulin 2.2 Units/hr (09/27/19 0915)   [START ON 09/28/2019] vancomycin     PRN Meds:.dextrose, famotidine, hydrALAZINE, loperamide   Time Spent in minutes  35  See all  Orders from today for further details   Lala Lund M.D on 09/27/2019 at 11:51 AM  To page go to [email or web address] - use universal password  [facility] -  Office  [phone]    Objective:   Vitals:   09/26/19 2348 09/27/19 0032 09/27/19 0433 09/27/19 0757  BP: (!) 197/89 (!) 162/52 (!) 170/58 (!) 181/60  Pulse: 85 82 80   Resp:      Temp: 98.1 F (36.7 C)  97.6 F (36.4 C) 98.6 F (37 C)  TempSrc: Oral  Oral Oral  SpO2: 96% 99% 94%   Weight:      Height:        Wt Readings from Last 3 Encounters:  09/26/19 64.2 kg  09/18/19 68.9 kg  05/27/19 65.8 kg     Intake/Output Summary (Last 24 hours) at 09/27/2019 1151 Last data filed at 09/27/2019 0400 Gross per 24 hour  Intake 1215.4 ml  Output 600 ml  Net 615.4 ml     Physical Exam  Somnolent lying in bed unable to follow commands reliably, no focal deficits, moves all 4 extremities to painful stimuli, [place].AT,PERRAL Supple Neck,No JVD,  No cervical lymphadenopathy appriciated.  Symmetrical Chest wall movement, Good air movement bilaterally, CTAB RRR,No Gallops, Rubs or new Murmurs, No Parasternal Heave +ve B.Sounds, Abd Soft, No tenderness, No organomegaly appriciated, No rebound - guarding or rigidity. No Cyanosis, Clubbing or edema, No new Rash or bruise    Data Review:    CBC Recent Labs  Lab 09/14/2019 1634 09/26/19 0827 09/27/19 0334  WBC 6.5 6.6 10.2  HGB 9.9* 10.4* 8.2*  HCT 31.6* 32.8* 25.8*  PLT 162 147* 136*  MCV 92.1 89.1 87.8  MCH 28.9 28.3 27.9  MCHC 31.3 31.7 31.8  RDW 15.0 15.2 15.2  LYMPHSABS 0.4*  --   --   MONOABS 0.6  --   --   EOSABS 0.1  --   --   BASOSABS 0.0  --   --     Chemistries  Recent Labs  Lab 09/24/2019 1634 09/26/19 0827 09/27/19 0334  NA 149* 148* 151*  K 4.3 4.7 4.2  CL 120* 122* 123*  CO2 14* 10* 16*  GLUCOSE 346* 349* 271*  BUN 39* 42* 46*  CREATININE 3.05* 2.75* 2.64*  CALCIUM 9.4 9.6 9.5  AST 14* 14* 15  ALT 14 13 12   ALKPHOS 57 59 46    BILITOT 1.0 1.2 0.7   ------------------------------------------------------------------------------------------------------------------ No results for input(s): CHOL, HDL, LDLCALC, TRIG, CHOLHDL, LDLDIRECT in the last 72 hours.  Lab Results  Component Value Date   HGBA1C 6.9 (H) 06/17/2018   ------------------------------------------------------------------------------------------------------------------ No results for input(s): TSH, T4TOTAL, T3FREE, THYROIDAB in the last 72 hours.  Invalid input(s): FREET3 ------------------------------------------------------------------------------------------------------------------ Recent Labs    09/27/19 0334  FERRITIN 517*    Coagulation profile No results for input(s): INR, PROTIME in the last 168 hours.  Recent Labs    09/26/19 0827 09/27/19 0334  DDIMER 9.32* 3.73*    Cardiac Enzymes No results for input(s): CKMB, TROPONINI, MYOGLOBIN in the last 168 hours.  Invalid input(s): CK ------------------------------------------------------------------------------------------------------------------    Component Value Date/Time   BNP 242.9 (H) 06/17/2018 1955    Micro Results Recent Results (from the past 240 hour(s))  Respiratory Panel by PCR     Status: None   Collection Time: 09/26/19  7:56 AM   Specimen: Nasopharyngeal Swab; Respiratory  Result Value Ref Range Status   Adenovirus NOT DETECTED NOT DETECTED Final   Coronavirus 229E NOT DETECTED NOT DETECTED Final    Comment: (NOTE) The Coronavirus on the Respiratory Panel, DOES NOT test for the novel  Coronavirus (2019 nCoV)    Coronavirus HKU1 NOT DETECTED NOT DETECTED Final   Coronavirus NL63 NOT DETECTED NOT DETECTED Final   Coronavirus OC43 NOT DETECTED NOT DETECTED Final   Metapneumovirus NOT DETECTED NOT DETECTED Final   Rhinovirus / Enterovirus NOT DETECTED NOT DETECTED Final   Influenza A NOT DETECTED NOT DETECTED Final   Influenza B NOT DETECTED NOT DETECTED  Final   Parainfluenza Virus 1 NOT DETECTED NOT DETECTED Final   Parainfluenza Virus 2 NOT DETECTED NOT DETECTED Final   Parainfluenza Virus 3 NOT DETECTED NOT DETECTED Final   Parainfluenza Virus 4 NOT DETECTED NOT DETECTED Final   Respiratory Syncytial Virus NOT DETECTED NOT DETECTED Final   Bordetella pertussis NOT DETECTED NOT DETECTED Final   Chlamydophila pneumoniae NOT DETECTED NOT DETECTED Final   Mycoplasma pneumoniae NOT DETECTED NOT DETECTED Final    Comment: Performed at [facility], [address] [place], [place] [address]  MRSA PCR Screening     Status: None   Collection  Time: 09/26/19 11:17 AM   Specimen: Nasal Mucosa; Nasopharyngeal  Result Value Ref Range Status   MRSA by PCR NEGATIVE NEGATIVE Final    Comment:        The GeneXpert MRSA Assay (FDA approved for NASAL specimens only), is one component of a comprehensive MRSA colonization surveillance program. It is not intended to diagnose MRSA infection nor to guide or monitor treatment for MRSA infections. Performed at [facility], [place] [address] [place], [place] [address]     Radiology Reports Ct Head Wo Contrast  Result Date: 09/22/2019 CLINICAL DATA:  Head trauma worsening mental status EXAM: CT HEAD WITHOUT CONTRAST TECHNIQUE: Contiguous axial images were obtained from the base of the skull through the vertex without intravenous contrast. COMPARISON:  MRI 01/29/2017 FINDINGS: Brain: No acute territorial infarction, hemorrhage or intracranial mass. The ventricles are nonenlarged. Vascular: No hyperdense vessels.  Carotid vascular calcification Skull: Normal. Negative for fracture or focal lesion. Sinuses/Orbits: Mucosal thickening in the ethmoid sinuses Other: None IMPRESSION: Negative non contrasted CT appearance of the brain Electronically Signed   By: Donavan Foil M.D.   On: 09/27/2019 22:29   Ct Chest Wo Contrast  Result Date: 09/26/2019 CLINICAL DATA:  Pneumonia. EXAM: CT  CHEST WITHOUT CONTRAST TECHNIQUE: Multidetector CT imaging of the chest was performed following the standard protocol without IV contrast. COMPARISON:  September 25, 2019. FINDINGS: Cardiovascular: Atherosclerosis of thoracic aorta is noted without aneurysm formation. Normal cardiac size. No pericardial effusion. Mediastinum/Nodes: Small sliding-type hiatal hernia is noted. No definite adenopathy is noted, although imaging is limited due to the lack of intravenous contrast. Mild bilateral thyroid gland enlargement is noted which is somewhat heterogeneous. Lungs/Pleura: No pneumothorax is noted. Small bilateral pleural effusions are noted. Multifocal airspace opacities are noted throughout both lungs, most prominently in the peripheral regions, most consistent with multifocal pneumonia, potentially of atypical or viral etiology. Upper Abdomen: Bilateral renal atrophy is noted consistent with history of end-stage renal disease. Musculoskeletal: No chest wall mass or suspicious bone lesions identified. IMPRESSION: Multiple airspace opacities are noted bilaterally and most prominently in the peripheral regions, most consistent with multifocal pneumonia, most likely of atypical or viral etiology. Small bilateral pleural effusions are noted. Small sliding-type hiatal hernia. Aortic Atherosclerosis (ICD10-I70.0). Electronically Signed   By: Marijo Conception M.D.   On: 09/26/2019 15:41   Mr Brain Wo Contrast  Result Date: 09/26/2019 CLINICAL DATA:  Initial evaluation for acute encephalopathy. EXAM: MRI HEAD WITHOUT CONTRAST TECHNIQUE: Multiplanar, multiecho pulse sequences of the brain and surrounding structures were obtained without intravenous contrast. COMPARISON:  Prior head CT from 09/06/2019. FINDINGS: Brain: Examination moderately to severely degraded by motion artifact. Generalized age appropriate cerebral atrophy. No focal parenchymal signal abnormality or significant cerebral white matter changes seen on this  motion degraded exam. No abnormal foci of restricted diffusion to suggest acute or subacute ischemia. Gray-white matter differentiation maintained. No encephalomalacia to suggest chronic cortical infarction. No foci of susceptibility artifact to suggest acute or chronic intracranial hemorrhage. No mass lesion, midline shift or mass effect. No hydrocephalus. No extra-axial fluid collection. Vascular: Major intracranial vascular flow voids maintained. Skull and upper cervical spine: Craniocervical junction grossly within normal limits. Bone marrow signal intensity normal. No scalp soft tissue abnormality. Sinuses/Orbits: Globes and orbital soft tissues within normal limits. Paranasal sinuses are largely clear. No significant mastoid effusion. Other: None. IMPRESSION: 1. Technically limited exam due to extensive motion artifact. 2. Grossly normal brain MRI. No acute intracranial abnormality identified. Electronically Signed  By: Jeannine Boga M.D.   On: 09/26/2019 23:22   US Renal  Result Date: 09/17/2019 CLINICAL DATA:  Acute kidney injury EXAM: RENAL / URINARY TRACT ULTRASOUND COMPLETE COMPARISON:  None. FINDINGS: Right Kidney: Renal measurements: 9.2 x 4.7 x 4.6 cm = volume: 104 mL. Markedly increased echotexture and cortical thinning. 1.2 cm cyst in the midpole. No hydronephrosis. Left Kidney: Renal measurements: 9.1 x 4.8 x 4.7 cm = volume: 108 mL. Markedly increased echotexture and cortical thinning. No mass or hydronephrosis. Bladder: Appears normal for degree of bladder distention. Incidentally noted is ascites in the lower abdomen/pelvis. IMPRESSION: Markedly increased echotexture within the kidneys with cortical thinning compatible with chronic medical renal disease. No hydronephrosis. Ascites. Electronically Signed   By: Rolm Baptise M.D.   On: 09/17/2019 12:05   Dg Chest Portable 1 View  Result Date: 09/14/2019 CLINICAL DATA:  Recent change in mental status. Known COVID positive. EXAM:  PORTABLE CHEST 1 VIEW COMPARISON:  09/12/2019 FINDINGS: Worsening of bilateral opacities with multifocal areas of nodular opacity and peripheral changes. Also with left lower lobe consolidation since prior study. Cardiomediastinal contours are enlarged. No acute bone process. IMPRESSION: Worsening of multifocal pneumonia in the setting of known COVID infection. No other interval changes. Electronically Signed   By: Zetta Bills M.D.   On: 09/21/2019 15:10   Dg Chest Portable 1 View  Result Date: 09/12/2019 CLINICAL DATA:  Cough and fever. EXAM: PORTABLE CHEST 1 VIEW COMPARISON:  06/17/2018 FINDINGS: There are patchy faint bilateral peripheral infiltrates in both mid zones and at both lung bases worrisome for viral pneumonia. Heart size and pulmonary vascularity are normal. No effusions. No significant bone abnormality. IMPRESSION: Patchy bilateral peripheral infiltrates in the mid zones and at both lung bases worrisome for viral pneumonia. No other significant abnormalities. Electronically Signed   By: Lorriane Shire M.D.   On: 09/12/2019 18:16

## 2019-09-27 NOTE — Progress Notes (Signed)
2215- Patient IV infiltrated, patient has inactive fistula in right arm, which the IV is in. Active fistula in the left arm. Charge RN made aware, stated to consult IV team. Call to Dr.David, said ok to try IV below the inactive fistula. Staff RN attempted IV times 2, without results. Call CRNA to try IV.

## 2019-09-28 ENCOUNTER — Inpatient Hospital Stay (HOSPITAL_COMMUNITY): Payer: Medicare Other

## 2019-09-28 ENCOUNTER — Inpatient Hospital Stay (HOSPITAL_COMMUNITY)
Admit: 2019-09-28 | Discharge: 2019-09-28 | Disposition: A | Discharge status: Home / Self Care | Payer: Medicare Other | Attending: Neurology | Admitting: Neurology

## 2019-09-28 DIAGNOSIS — J1289 Other viral pneumonia: Secondary | ICD-10-CM | POA: Diagnosis not present

## 2019-09-28 DIAGNOSIS — R401 Stupor: Secondary | ICD-10-CM | POA: Diagnosis not present

## 2019-09-28 DIAGNOSIS — U071 COVID-19: Secondary | ICD-10-CM | POA: Diagnosis not present

## 2019-09-28 LAB — POCT I-STAT 7, (LYTES, BLD GAS, ICA,H+H)
Acid-base deficit: 4 mmol/L — ABNORMAL HIGH (ref 0.0–2.0)
Bicarbonate: 20.9 mmol/L (ref 20.0–28.0)
Calcium, Ion: 1.47 mmol/L — ABNORMAL HIGH (ref 1.15–1.40)
HCT: 23 % — ABNORMAL LOW (ref 36.0–46.0)
Hemoglobin: 7.8 g/dL — ABNORMAL LOW (ref 12.0–15.0)
O2 Saturation: 96 %
Patient temperature: 97.2
Potassium: 3.3 mmol/L — ABNORMAL LOW (ref 3.5–5.1)
Sodium: 150 mmol/L — ABNORMAL HIGH (ref 135–145)
TCO2: 22 mmol/L (ref 22–32)
pCO2 arterial: 34.1 mmHg (ref 32.0–48.0)
pH, Arterial: 7.391 (ref 7.350–7.450)
pO2, Arterial: 82 mmHg — ABNORMAL LOW (ref 83.0–108.0)

## 2019-09-28 LAB — GLUCOSE, CAPILLARY
Glucose-Capillary: 115 mg/dL — ABNORMAL HIGH (ref 70–99)
Glucose-Capillary: 116 mg/dL — ABNORMAL HIGH (ref 70–99)
Glucose-Capillary: 121 mg/dL — ABNORMAL HIGH (ref 70–99)
Glucose-Capillary: 124 mg/dL — ABNORMAL HIGH (ref 70–99)
Glucose-Capillary: 241 mg/dL — ABNORMAL HIGH (ref 70–99)
Glucose-Capillary: 246 mg/dL — ABNORMAL HIGH (ref 70–99)
Glucose-Capillary: 274 mg/dL — ABNORMAL HIGH (ref 70–99)
Glucose-Capillary: 277 mg/dL — ABNORMAL HIGH (ref 70–99)
Glucose-Capillary: 64 mg/dL — ABNORMAL LOW (ref 70–99)
Glucose-Capillary: 73 mg/dL (ref 70–99)
Glucose-Capillary: 77 mg/dL (ref 70–99)
Glucose-Capillary: 81 mg/dL (ref 70–99)
Glucose-Capillary: 96 mg/dL (ref 70–99)

## 2019-09-28 LAB — COMPREHENSIVE METABOLIC PANEL
ALT: 13 U/L (ref 0–44)
AST: 21 U/L (ref 15–41)
Albumin: 2.5 g/dL — ABNORMAL LOW (ref 3.5–5.0)
Alkaline Phosphatase: 69 U/L (ref 38–126)
Anion gap: 9 (ref 5–15)
BUN: 42 mg/dL — ABNORMAL HIGH (ref 8–23)
CO2: 21 mmol/L — ABNORMAL LOW (ref 22–32)
Calcium: 9.7 mg/dL (ref 8.9–10.3)
Chloride: 116 mmol/L — ABNORMAL HIGH (ref 98–111)
Creatinine, Ser: 2.88 mg/dL — ABNORMAL HIGH (ref 0.44–1.00)
GFR calc Af Amer: 18 mL/min — ABNORMAL LOW (ref >60)
GFR calc non Af Amer: 16 mL/min — ABNORMAL LOW (ref >60)
Glucose, Bld: 167 mg/dL — ABNORMAL HIGH (ref 70–99)
Potassium: 3.7 mmol/L (ref 3.5–5.1)
Sodium: 146 mmol/L — ABNORMAL HIGH (ref 135–145)
Total Bilirubin: 0.5 mg/dL (ref 0.3–1.2)
Total Protein: 6.5 g/dL (ref 6.5–8.1)

## 2019-09-28 LAB — CBC WITH DIFFERENTIAL/PLATELET
Abs Immature Granulocytes: 0.07 10*3/uL (ref 0.00–0.07)
Basophils Absolute: 0 10*3/uL (ref 0.0–0.1)
Basophils Relative: 0 %
Eosinophils Absolute: 0.1 10*3/uL (ref 0.0–0.5)
Eosinophils Relative: 1 %
HCT: 27.3 % — ABNORMAL LOW (ref 36.0–46.0)
Hemoglobin: 8.7 g/dL — ABNORMAL LOW (ref 12.0–15.0)
Immature Granulocytes: 1 %
Lymphocytes Relative: 4 %
Lymphs Abs: 0.5 10*3/uL — ABNORMAL LOW (ref 0.7–4.0)
MCH: 28.6 pg (ref 26.0–34.0)
MCHC: 31.9 g/dL (ref 30.0–36.0)
MCV: 89.8 fL (ref 80.0–100.0)
Monocytes Absolute: 0.8 10*3/uL (ref 0.1–1.0)
Monocytes Relative: 7 %
Neutro Abs: 10.9 10*3/uL — ABNORMAL HIGH (ref 1.7–7.7)
Neutrophils Relative %: 87 %
Platelets: 130 10*3/uL — ABNORMAL LOW (ref 150–400)
RBC: 3.04 MIL/uL — ABNORMAL LOW (ref 3.87–5.11)
RDW: 15.2 % (ref 11.5–15.5)
WBC: 12.4 10*3/uL — ABNORMAL HIGH (ref 4.0–10.5)
nRBC: 0 % (ref 0.0–0.2)

## 2019-09-28 LAB — BRAIN NATRIURETIC PEPTIDE: B Natriuretic Peptide: 171.4 pg/mL — ABNORMAL HIGH (ref 0.0–100.0)

## 2019-09-28 LAB — URINALYSIS, ROUTINE W REFLEX MICROSCOPIC
Bilirubin Urine: NEGATIVE
Glucose, UA: >=500 mg/dL — AB
Ketones, ur: 5 mg/dL — AB
Leukocytes,Ua: NEGATIVE
Nitrite: NEGATIVE
Protein, ur: >=300 mg/dL — AB
Specific Gravity, Urine: 1.015 (ref 1.005–1.030)
pH: 5 (ref 5.0–8.0)

## 2019-09-28 LAB — PROCALCITONIN: Procalcitonin: 0.2 ng/mL

## 2019-09-28 LAB — BETA-HYDROXYBUTYRIC ACID: Beta-Hydroxybutyric Acid: 0.25 mmol/L (ref 0.05–0.27)

## 2019-09-28 LAB — MAGNESIUM: Magnesium: 2 mg/dL (ref 1.7–2.4)

## 2019-09-28 LAB — C-REACTIVE PROTEIN: CRP: 1.3 mg/dL — ABNORMAL HIGH (ref <1.0)

## 2019-09-28 LAB — D-DIMER, QUANTITATIVE: D-Dimer, Quant: 3.04 ug/mL-FEU — ABNORMAL HIGH (ref 0.00–0.50)

## 2019-09-28 MED ORDER — PANTOPRAZOLE SODIUM 40 MG PO PACK
40.0000 mg | PACK | Freq: Every day | ORAL | Status: DC
  Administered 2019-09-29 – 2019-10-04 (×6): 40 mg
  Filled 2019-09-28 (×7): qty 20

## 2019-09-28 MED ORDER — INSULIN GLARGINE 100 UNIT/ML ~~LOC~~ SOLN
14.0000 [IU] | Freq: Every day | SUBCUTANEOUS | Status: DC
  Administered 2019-09-28: 14 [IU] via SUBCUTANEOUS
  Filled 2019-09-28 (×2): qty 0.14

## 2019-09-28 MED ORDER — INSULIN ASPART 100 UNIT/ML ~~LOC~~ SOLN
0.0000 [IU] | SUBCUTANEOUS | Status: DC
  Administered 2019-09-28: 3 [IU] via SUBCUTANEOUS
  Administered 2019-09-28 (×2): 5 [IU] via SUBCUTANEOUS
  Administered 2019-09-29: 13:00:00 1 [IU] via SUBCUTANEOUS
  Administered 2019-09-29: 17:00:00 5 [IU] via SUBCUTANEOUS
  Administered 2019-09-29 (×2): 3 [IU] via SUBCUTANEOUS
  Administered 2019-09-30: 13:00:00 7 [IU] via SUBCUTANEOUS
  Administered 2019-09-30: 09:00:00 5 [IU] via SUBCUTANEOUS
  Administered 2019-09-30: 7 [IU] via SUBCUTANEOUS
  Administered 2019-09-30: 04:00:00 3 [IU] via SUBCUTANEOUS
  Administered 2019-09-30: 5 [IU] via SUBCUTANEOUS
  Administered 2019-10-01 (×2): 3 [IU] via SUBCUTANEOUS
  Administered 2019-10-01: 5 [IU] via SUBCUTANEOUS
  Administered 2019-10-01: 3 [IU] via SUBCUTANEOUS

## 2019-09-28 NOTE — Progress Notes (Signed)
[name]  DVV:[id number] DOB: January 31, 1946 DOA: 09/26/2019 PCP: Earlene Plater, MD    Brief Narrative:  73 year old with a history of renal transplantation, chronic kidney disease stage IV, HIV on antiretrovirals, chronic hepatitis C, and DM 2 who was hospitalized at [facility] 10/9 > 10/16 for Covid gastroenteritis and pneumonitis complicated by acute kidney injury.  She was referred back to the ED 10/22 by her PCP with complaints of weakness and lethargy.  In the ED she was found to have worsening infiltrates on a CXR and was admitted to [facility].  Significant Events: 10/22 admit to [facility] via [facility] ED 10/24 transfer to ICU with altered mental status -intubated 10/24 MRI brain -no acute findings 10/25 extubated 10/25 EEG  COVID-19 specific Treatment: none this admission  Subjective: Alert though somewhat agitated.  Does not appear uncomfortable home vent.  Respirations are nonlabored.  Assessment & Plan:  Pneumonitis/bilateral pulmonary infiltrates Etiology unclear -lymphocytic predominant as per BAL specimen - PCCM following -clinical findings not suggestive of continued or recurrent Covid -completed the course of remdesivir during her last hospitalization  Acute metabolic encephalopathy MRI brain unrevealing -perhaps purely metabolic/related to DKA -EEG pending -appreciate neurology input -on thiamine  Myoclonic jerks EEG pending -appreciate neurology input  DM2 with severe hyperglycemia/possible early DKA DKA resolved with normal anion gap -CBG trending back up -adjust treatment and follow  Hypernatremia Continue to expand free water and follow  HIV Continue usual outpatient medical therapy  Status post renal transplant with CKD stage IV Continue usual doses of CellCept and tacrolimus  HTN Blood pressure currently controlled  HLD  GERD  Deconditioning/debility  DVT prophylaxis: Subcutaneous heparin Code Status: FULL CODE  Family Communication: Per PCCM Disposition Plan: ICU -anticipate extubation today  Consultants:  PCCM Neurology  Antimicrobials:  Vancomycin 10/22 > 10/24 Cefepime 10/22 > 10/24  Objective: Blood pressure (!) 113/32, pulse 60, temperature 98.4 F (36.9 C), temperature source Oral, resp. rate (!) 25, height _0  (1.6 m), weight 64.2 kg, SpO2 100 %.  Intake/Output Summary (Last 24 hours) at 09/28/2019 0840 Last data filed at 09/28/2019 0700 Gross per 24 hour  Intake 1917.71 ml  Output 350 ml  Net 1567.71 ml   Filed Weights   09/22/2019 1421 09/26/19 0520  Weight: 65.8 kg 64.2 kg    Examination: General: No acute respiratory distress Lungs: Fine crackles diffusely -no wheezing Cardiovascular: Regular rate and rhythm without murmur gallop or rub normal S1 and S2 Abdomen: Nontender, nondistended, soft, bowel sounds positive, no rebound, no ascites, no appreciable mass Extremities: No significant cyanosis, clubbing, or edema bilateral lower extremities  CBC: Recent Labs  Lab 09/21/2019 1634 09/26/19 0827 09/27/19 0334 09/27/19 1450 09/28/19 0420  WBC 6.5 6.6 10.2  --   --   NEUTROABS 5.4  --   --   --   --   HGB 9.9* 10.4* 8.2* 7.5* 7.8*  HCT 31.6* 32.8* 25.8* 22.0* 23.0*  MCV 92.1 89.1 87.8  --   --   PLT 162 147* 136*  --   --    Basic Metabolic Panel: Recent Labs  Lab 09/26/19 0827 09/27/19 0334 09/27/19 1447 09/27/19 1450 09/28/19 0420  NA 148* 151* 152* 153* 150*  K 4.7 4.2 3.7 3.6 3.3*  CL 122* 123* 123*  --   --   CO2 10* 16* 21*  --   --   GLUCOSE 349* 271* 197*  --   --   BUN 42* 46* 44*  --   --  CREATININE 2.75* 2.64* 2.92*  --   --   CALCIUM 9.6 9.5 9.5  --   --   MG  --   --  2.0  --   --    GFR: Estimated Creatinine Clearance: 16.2 mL/min (A) (by C-G formula based on SCr of 2.92 mg/dL (H)).  Liver Function Tests: Recent Labs  Lab 09/23/2019 1634 09/26/19 0827 09/27/19 0334  AST 14* 14* 15  ALT _0 ALKPHOS 57 59 46  BILITOT 1.0  1.2 0.7  PROT 5.8* 7.1 5.8*  ALBUMIN 2.3* 2.8* 2.3*    Recent Labs  Lab 09/27/19 0334  AMMONIA 21    HbA1C: Hgb A1c MFr Bld  Date/Time Value Ref Range Status  09/27/2019 03:34 AM 7.7 (H) 4.8 - 5.6 % Final    Comment:    (NOTE) Pre diabetes:          5.7%-6.4% Diabetes:              >6.4% Glycemic control for   <7.0% adults with diabetes   06/17/2018 07:55 PM 6.9 (H) 4.8 - 5.6 % Final    Comment:    (NOTE) Pre diabetes:          5.7%-6.4% Diabetes:              >6.4% Glycemic control for   <7.0% adults with diabetes     CBG: Recent Labs  Lab 09/28/19 0323 09/28/19 0427 09/28/19 0531 09/28/19 0630 09/28/19 0734  GLUCAP 73 81 96 124* 121*    Recent Results (from the past 240 hour(s))  Respiratory Panel by PCR     Status: None   Collection Time: 09/26/19  7:56 AM   Specimen: Nasopharyngeal Swab; Respiratory  Result Value Ref Range Status   Adenovirus NOT DETECTED NOT DETECTED Final   Coronavirus 229E NOT DETECTED NOT DETECTED Final    Comment: (NOTE) The Coronavirus on the Respiratory Panel, DOES NOT test for the novel  Coronavirus (2019 nCoV)    Coronavirus HKU1 NOT DETECTED NOT DETECTED Final   Coronavirus NL63 NOT DETECTED NOT DETECTED Final   Coronavirus OC43 NOT DETECTED NOT DETECTED Final   Metapneumovirus NOT DETECTED NOT DETECTED Final   Rhinovirus / Enterovirus NOT DETECTED NOT DETECTED Final   Influenza A NOT DETECTED NOT DETECTED Final   Influenza B NOT DETECTED NOT DETECTED Final   Parainfluenza Virus 1 NOT DETECTED NOT DETECTED Final   Parainfluenza Virus 2 NOT DETECTED NOT DETECTED Final   Parainfluenza Virus 3 NOT DETECTED NOT DETECTED Final   Parainfluenza Virus 4 NOT DETECTED NOT DETECTED Final   Respiratory Syncytial Virus NOT DETECTED NOT DETECTED Final   Bordetella pertussis NOT DETECTED NOT DETECTED Final   Chlamydophila pneumoniae NOT DETECTED NOT DETECTED Final   Mycoplasma pneumoniae NOT DETECTED NOT DETECTED Final    Comment:  Performed at [facility], [address] [place], [place] [address]  MRSA PCR Screening     Status: None   Collection Time: 09/26/19 11:17 AM   Specimen: Nasal Mucosa; Nasopharyngeal  Result Value Ref Range Status   MRSA by PCR NEGATIVE NEGATIVE Final    Comment:        The GeneXpert MRSA Assay (FDA approved for NASAL specimens only), is one component of a comprehensive MRSA colonization surveillance program. It is not intended to diagnose MRSA infection nor to guide or monitor treatment for MRSA infections. Performed at [facility], [place] [address] [place], [place] [address]  Culture, respiratory (non-expectorated)     Status: None (Preliminary result)   Collection Time: 09/27/19 12:40 PM   Specimen: Bronchoalveolar Lavage; Respiratory  Result Value Ref Range Status   Specimen Description   Final    BRONCHIAL ALVEOLAR LAVAGE Performed at [facility] [address] [place], [place] [address]    Special Requests   Final    Immunocompromised Performed at [facility], [place] [address], [place], [place] [address]    Gram Stain   Final    FEW WBC PRESENT,BOTH PMN AND MONONUCLEAR RARE GRAM NEGATIVE RODS Performed at [facility], [place] [address] [place], [place] [address]    Culture PENDING  Incomplete   Report Status PENDING  Incomplete     Scheduled Meds: . amLODipine  10 mg Per Tube Daily  . aspirin  81 mg Per Tube Daily  . calcitRIOL  0.25 mcg Per Tube Daily  . carvedilol  12.5 mg Per Tube BID WC  . chlorhexidine gluconate (MEDLINE KIT)  15 mL Mouth Rinse BID  . Chlorhexidine Gluconate Cloth  6 each Topical Daily  . cinacalcet  60 mg Oral Daily  . dolutegravir  50 mg Oral Daily  . famotidine  20 mg Per Tube Daily  . free water  200 mL Per Tube Q6H  . heparin  7,500 Units Subcutaneous Q8H  . lamiVUDine  100 mg Per Tube Daily  . magnesium oxide  400 mg Per Tube Daily  . mouth rinse  15 mL Mouth  Rinse 10 times per day  . methylPREDNISolone (SOLU-MEDROL) injection  30 mg Intravenous Daily  . Mycophenolate Mofetil oral suspension 155m/ml - 4.362m 540 mg Per Tube BID  . pantoprazole (PROTONIX) IV  40 mg Intravenous Q24H  . rosuvastatin  20 mg Per Tube Daily  . sodium bicarbonate  650 mg Per Tube BID  . tacrolimus  2 mg Oral Daily  . tacrolimus  3 mg Oral QHS   Continuous Infusions: . ceFEPime (MAXIPIME) IV Stopped (09/27/19 2205)  . dexmedetomidine (PRECEDEX) IV infusion Stopped (09/27/19 1725)  . dextrose 50 mL/hr at 09/28/19 0700  . feeding supplement (GLUCERNA 1.2 CAL)    . insulin Stopped (09/28/19 0700)  . vancomycin 200 mL/hr at 09/28/19 0600     LOS: 2 days   JeCherene AltesMD [facility] Office  [phone] - Text Page per Amion  If 7PM-7AM, please contact night-coverage per Amion 09/28/2019, 8:40 AM

## 2019-09-28 NOTE — Progress Notes (Signed)
Initial Nutrition Assessment  INTERVENTION:   -Will monitor for diet advancement  Once diet advanced: -Glucerna Shake po TID, each supplement provides 220 kcal and 10 grams of protein -Prostat liquid protein PO 30 ml BID with meals, each supplement provides 100 kcal, 15 grams protein. -Multivitamin with minerals daily  NUTRITION DIAGNOSIS:   Increased nutrient needs related to acute illness as evidenced by estimated needs.  GOAL:   Patient will meet greater than or equal to 90% of their needs  MONITOR:   Diet advancement, Labs, Weight trends, I & O's  REASON FOR ASSESSMENT:   Consult Assessment of nutrition requirement/status  ASSESSMENT:   73 y/o female admitted on 10/22 to [facility] for lethargy and pulmonary new pulmonary infiltrates that developed one week after she was treated for COVID pneumonia. COVID-19 positive as of 10/10  Previous admission: 10/10-10/16 10/22: re-admitted 10/24: moved to ICU, intubated  **RD working remotely**  Pt now extubated as of this morning. Pt was ordered tube feeding over the weekend, Glucerna 1.2. Now off since OGT removed.  Pt continues to have AMS. Per pt's daughter, pt has a poor appetite which has continued since initial diagnosis of COVID-19. Pt was not taking medications PO given mental status.  SLP to see patient for evaluation. Will monitor for diet advancement. If unable to have diet advanced, may need feeding tube placed and nutrition support.  Per weight records, pt's weight has remained between 141-151 lbs since December 2019.  I/Os: +2.2L since admit UOP: 450 ml x 24 hrs  Medications: MAG-OX tablet Labs reviewed: CBGs: 96-124 Elevated Na GFR: 18  NUTRITION - FOCUSED PHYSICAL EXAM:  Unable to provide -working remotely.   Diet Order:   Diet Order            Diet NPO time specified  Diet effective now              EDUCATION NEEDS:   Not appropriate for education at this time  Skin:  Skin Assessment:  Reviewed RN Assessment  Last BM:  10/24- type 6  Height:   Ht Readings from Last 1 Encounters:  09/24/2019 5\' 3"  (1.6 m)    Weight:   Wt Readings from Last 1 Encounters:  09/26/19 64.2 kg    Ideal Body Weight:  52.3 kg  BMI:  Body mass index is 25.07 kg/m.  Estimated Nutritional Needs:   Kcal:  1950-2150  Protein:  90-100g  Fluid:  2L/day  Clayton Bibles, MS, RD, LDN Inpatient Clinical Dietitian Pager: [phone] After Hours Pager: [phone]

## 2019-09-28 NOTE — Progress Notes (Signed)
EEG completed, results pending. 

## 2019-09-28 NOTE — Progress Notes (Signed)
NAME:  [name], MRN:  [id number], DOB:  09-May-1946, LOS: 2 ADMISSION DATE:  09/06/2019, CONSULTATION DATE:  10/23 REFERRING MD:  Sloan Leiter, CHIEF COMPLAINT:  Dyspnea   Brief History   73 y/o female admitted on 10/22 to [facility] for lethargy and pulmonary new pulmonary infiltrates that developed one week after she was treated for COVID pneumonia.  Past Medical History  DM2 PCP pneumonia 1998 HTN HIV Hep C GERD ESRD s/p renal transplant CHF > LV normal, moderate concentric LVH, LVEF 60-65%, left atrium moderately dilated  Significant Hospital Events   10/22 admission 10/24 moved to ICU for declining mental status, intubated, bronchoscopy performed  Consults:  Pulmonary   Procedures:  10/24 ETT >   Significant Diagnostic Tests:  10/23 CT chest images personally reviewed> patchy and nodular consolidation bilaterally predominantly in upper lobes and periphery but upper lobe predeominant bronchovascular distribution some patchy ggo in bases with pleural effusions bilaterally, ? Cavitary lesion RLL 10/22 CT head > NAICP 10/24 MRI Brain > no acute findings, motion artifact 10/25 EEG >   Micro Data:  10/23 respiratory viral panel > negative 10/24 BAL bacterial >  10/24 BAL fungal >  10/24 BAL AFB >  10/24 BAL aspergillus antigen >  10/24 BAL PCP >    Antimicrobials:  10/22 vanc >  10/22 cefepime >   Interim history/subjective:   Improved mental status Passing SBT this morning   Objective   Blood pressure (!) 113/32, pulse 60, temperature 98.4 F (36.9 C), temperature source Oral, resp. rate (!) 25, height _0  (1.6 m), weight 64.2 kg, SpO2 100 %.    Vent Mode: PRVC FiO2 (%):  [30 %-100 %] 30 % Set Rate:  [16 bmp] 16 bmp Vt Set:  [400 mL] 400 mL PEEP:  [5 cmH20] 5 cmH20 Plateau Pressure:  [14 cmH20-20 cmH20] 18 cmH20   Intake/Output Summary (Last 24 hours) at 09/28/2019 2542 Last data filed at 09/28/2019 0700 Gross per 24 hour  Intake 1917.71 ml  Output  350 ml  Net 1567.71 ml   Filed Weights   09/28/2019 1421 09/26/19 0520  Weight: 65.8 kg 64.2 kg    Examination:  General:  In bed on vent HENT: NCAT ETT in place PULM: CTA B, vent supported breathing CV: RRR, no mgr GI: BS+, soft, nontender MSK: normal bulk and tone Neuro: awake on vent, following commands   Resolved Hospital Problem list     Assessment & Plan:  Acute encephalopathy, most likely toxic metabolic, MRI brain negative, with myoclonus Neurology input appreciated Continue thiamine per neurology F/u EEG report from today Consider ID consult if no improvement Minimize sedating medications  Inability to protect airway: Intubate Full mechanical ventilator support Minimal sedation Daily WUA/SBT   Bilateral infiltrates in immunocompromised host who has recovered from COVID: unclear etiology, lymphocytic predominant, unlikely to represent sarcoidosis or hypersensitivity pneumonitis; B cell lymphoma still a concern but rapidity of time course too quick, is this a post COVID inflammatory infiltrate? Case report notes high lymph count in COVID [email or web address] Continue solumedrol as ordered, will likely treat with prolonged taper Repeat CXR in 2-3 days Monitor respiratory status  Renal transplant/CKD Continue tacrolimus and cellcept Monitor BMET and UOP Replace electrolytes as needed  Need for sedation: RASS target 0 to -1 precedex Prn fentanyl Avoid benzodiazepine  DKA: resolve Monitor glucose Continue insulin per TRH  Best practice:  Diet: NPO after midnight Pain/Anxiety/Delirium protocol (if indicated): n/a VAP protocol (if indicated): n/a DVT prophylaxis: sub q heparin  GI prophylaxis: n/a Glucose control: per TRH Mobility: bed rest Code Status: full Family Communication: I updated her daughter by phone today Disposition: move to ICU  Labs   CBC: Recent Labs  Lab 09/18/2019 1634 09/26/19 0827 09/27/19  0334 09/27/19 1450 09/28/19 0420  WBC 6.5 6.6 10.2  --   --   NEUTROABS 5.4  --   --   --   --   HGB 9.9* 10.4* 8.2* 7.5* 7.8*  HCT 31.6* 32.8* 25.8* 22.0* 23.0*  MCV 92.1 89.1 87.8  --   --   PLT 162 147* 136*  --   --     Basic Metabolic Panel: Recent Labs  Lab 09/29/2019 1634 09/26/19 0827 09/27/19 0334 09/27/19 1447 09/27/19 1450 09/28/19 0420  NA 149* 148* 151* 152* 153* 150*  K 4.3 4.7 4.2 3.7 3.6 3.3*  CL 120* 122* 123* 123*  --   --   CO2 14* 10* 16* 21*  --   --   GLUCOSE 346* 349* 271* 197*  --   --   BUN 39* 42* 46* 44*  --   --   CREATININE 3.05* 2.75* 2.64* 2.92*  --   --   CALCIUM 9.4 9.6 9.5 9.5  --   --   MG  --   --   --  2.0  --   --    GFR: Estimated Creatinine Clearance: 16.2 mL/min (A) (by C-G formula based on SCr of 2.92 mg/dL (H)). Recent Labs  Lab 09/15/2019 1634 09/26/19 0827 09/27/19 0334 09/27/19 0500  PROCALCITON  --  0.18  --  0.18  WBC 6.5 6.6 10.2  --   LATICACIDVEN 1.9  --   --   --     Liver Function Tests: Recent Labs  Lab 09/27/2019 1634 09/26/19 0827 09/27/19 0334  AST 14* 14* 15  ALT _0 ALKPHOS 57 59 46  BILITOT 1.0 1.2 0.7  PROT 5.8* 7.1 5.8*  ALBUMIN 2.3* 2.8* 2.3*   No results for input(s): LIPASE, AMYLASE in the last 168 hours. Recent Labs  Lab 09/27/19 0334  AMMONIA 21    ABG    Component Value Date/Time   PHART 7.391 09/28/2019 0420   PCO2ART 34.1 09/28/2019 0420   PO2ART 82.0 (L) 09/28/2019 0420   HCO3 20.9 09/28/2019 0420   TCO2 22 09/28/2019 0420   ACIDBASEDEF 4.0 (H) 09/28/2019 0420   O2SAT 96.0 09/28/2019 0420     Coagulation Profile: No results for input(s): INR, PROTIME in the last 168 hours.  Cardiac Enzymes: No results for input(s): CKTOTAL, CKMB, CKMBINDEX, TROPONINI in the last 168 hours.  HbA1C: Hgb A1c MFr Bld  Date/Time Value Ref Range Status  09/27/2019 03:34 AM 7.7 (H) 4.8 - 5.6 % Final    Comment:    (NOTE) Pre diabetes:          5.7%-6.4% Diabetes:              >6.4%  Glycemic control for   <7.0% adults with diabetes   06/17/2018 07:55 PM 6.9 (H) 4.8 - 5.6 % Final    Comment:    (NOTE) Pre diabetes:          5.7%-6.4% Diabetes:              >6.4% Glycemic control for   <7.0% adults with diabetes     CBG: Recent Labs  Lab 09/28/19 0323 09/28/19 0427 09/28/19 0531 09/28/19 0630 09/28/19 0734  GLUCAP 73 81 96  124* 121*     Critical care time: 31 minutes     Roselie Awkward, MD [facility] Pager: [phone] Cell: [phone] If no response, call [phone]

## 2019-09-28 NOTE — Procedures (Signed)
Extubation Procedure Note  Patient Details:   Name: [name] DOB: February 21, 1946 MRN: [id number]   Airway Documentation:    Vent end date: 09/28/19 Vent end time: 1018   Evaluation  O2 sats: stable throughout Complications: No apparent complications Patient did tolerate procedure well. Bilateral Breath Sounds: Diminished   Yes   Pt extubated to 3L HFNC pwe MD order. Pt stable throughout with no complications. Positive cuff leak noted prior to extubation. Pt able to speak and has a weak non productive cough post extubation. RT will continue to closely monitor pt  Jesse Sans 09/28/2019, 10:18 AM

## 2019-09-29 DIAGNOSIS — U071 COVID-19: Secondary | ICD-10-CM | POA: Diagnosis not present

## 2019-09-29 DIAGNOSIS — J1289 Other viral pneumonia: Secondary | ICD-10-CM | POA: Diagnosis not present

## 2019-09-29 DIAGNOSIS — R4182 Altered mental status, unspecified: Secondary | ICD-10-CM | POA: Diagnosis not present

## 2019-09-29 LAB — CBC WITH DIFFERENTIAL/PLATELET
Abs Immature Granulocytes: 0.04 10*3/uL (ref 0.00–0.07)
Basophils Absolute: 0 10*3/uL (ref 0.0–0.1)
Basophils Relative: 0 %
Eosinophils Absolute: 0.1 10*3/uL (ref 0.0–0.5)
Eosinophils Relative: 1 %
HCT: 23.5 % — ABNORMAL LOW (ref 36.0–46.0)
Hemoglobin: 7.5 g/dL — ABNORMAL LOW (ref 12.0–15.0)
Immature Granulocytes: 1 %
Lymphocytes Relative: 7 %
Lymphs Abs: 0.5 10*3/uL — ABNORMAL LOW (ref 0.7–4.0)
MCH: 28.3 pg (ref 26.0–34.0)
MCHC: 31.9 g/dL (ref 30.0–36.0)
MCV: 88.7 fL (ref 80.0–100.0)
Monocytes Absolute: 0.6 10*3/uL (ref 0.1–1.0)
Monocytes Relative: 8 %
Neutro Abs: 6.1 10*3/uL (ref 1.7–7.7)
Neutrophils Relative %: 83 %
Platelets: 83 10*3/uL — ABNORMAL LOW (ref 150–400)
RBC: 2.65 MIL/uL — ABNORMAL LOW (ref 3.87–5.11)
RDW: 14.6 % (ref 11.5–15.5)
WBC: 7.4 10*3/uL (ref 4.0–10.5)
nRBC: 0 % (ref 0.0–0.2)

## 2019-09-29 LAB — APTT: aPTT: 84 seconds — ABNORMAL HIGH (ref 24–36)

## 2019-09-29 LAB — PROCALCITONIN: Procalcitonin: 0.32 ng/mL

## 2019-09-29 LAB — COMPREHENSIVE METABOLIC PANEL
ALT: 12 U/L (ref 0–44)
AST: 12 U/L — ABNORMAL LOW (ref 15–41)
Albumin: 2.1 g/dL — ABNORMAL LOW (ref 3.5–5.0)
Alkaline Phosphatase: 52 U/L (ref 38–126)
Anion gap: 7 (ref 5–15)
BUN: 42 mg/dL — ABNORMAL HIGH (ref 8–23)
CO2: 21 mmol/L — ABNORMAL LOW (ref 22–32)
Calcium: 8.9 mg/dL (ref 8.9–10.3)
Chloride: 116 mmol/L — ABNORMAL HIGH (ref 98–111)
Creatinine, Ser: 2.87 mg/dL — ABNORMAL HIGH (ref 0.44–1.00)
GFR calc Af Amer: 18 mL/min — ABNORMAL LOW (ref >60)
GFR calc non Af Amer: 16 mL/min — ABNORMAL LOW (ref >60)
Glucose, Bld: 120 mg/dL — ABNORMAL HIGH (ref 70–99)
Potassium: 3.3 mmol/L — ABNORMAL LOW (ref 3.5–5.1)
Sodium: 144 mmol/L (ref 135–145)
Total Bilirubin: 0.2 mg/dL — ABNORMAL LOW (ref 0.3–1.2)
Total Protein: 5.3 g/dL — ABNORMAL LOW (ref 6.5–8.1)

## 2019-09-29 LAB — VITAMIN B12: Vitamin B-12: 320 pg/mL (ref 180–914)

## 2019-09-29 LAB — GLUCOSE, CAPILLARY
Glucose-Capillary: 173 mg/dL — ABNORMAL HIGH (ref 70–99)
Glucose-Capillary: 127 mg/dL — ABNORMAL HIGH (ref 70–99)
Glucose-Capillary: 83 mg/dL (ref 70–99)
Glucose-Capillary: 149 mg/dL — ABNORMAL HIGH (ref 70–99)
Glucose-Capillary: 257 mg/dL — ABNORMAL HIGH (ref 70–99)
Glucose-Capillary: 247 mg/dL — ABNORMAL HIGH (ref 70–99)

## 2019-09-29 LAB — FIBRINOGEN: Fibrinogen: 503 mg/dL — ABNORMAL HIGH (ref 210–475)

## 2019-09-29 LAB — AMMONIA: Ammonia: 22 umol/L (ref 9–35)

## 2019-09-29 LAB — TACROLIMUS LEVEL: Tacrolimus (FK506) - LabCorp: 3.3 ng/mL (ref 2.0–20.0)

## 2019-09-29 LAB — ACID FAST SMEAR (AFB, MYCOBACTERIA): Acid Fast Smear: NEGATIVE

## 2019-09-29 LAB — IRON AND TIBC
Iron: 31 ug/dL (ref 28–170)
Saturation Ratios: 25 % (ref 10.4–31.8)
TIBC: 126 ug/dL — ABNORMAL LOW (ref 250–450)
UIBC: 95 ug/dL

## 2019-09-29 LAB — TSH: TSH: 0.13 u[IU]/mL — ABNORMAL LOW (ref 0.350–4.500)

## 2019-09-29 LAB — MAGNESIUM: Magnesium: 1.8 mg/dL (ref 1.7–2.4)

## 2019-09-29 LAB — BRAIN NATRIURETIC PEPTIDE: B Natriuretic Peptide: 311.3 pg/mL — ABNORMAL HIGH (ref 0.0–100.0)

## 2019-09-29 LAB — PROTIME-INR
INR: 1.1 (ref 0.8–1.2)
Prothrombin Time: 14.3 seconds (ref 11.4–15.2)

## 2019-09-29 LAB — FOLATE: Folate: 7.3 ng/mL (ref >5.9)

## 2019-09-29 MED ORDER — JEVITY 1.2 CAL PO LIQD
1000.0000 mL | ORAL | Status: DC
  Filled 2019-09-29 (×2): qty 1000

## 2019-09-29 MED ORDER — NONFORMULARY OR COMPOUNDED ITEM
750.0000 mg | Freq: Two times a day (BID) | Status: DC
  Administered 2019-09-29 – 2019-10-04 (×10): 750 mg
  Filled 2019-09-29 (×13): qty 1

## 2019-09-29 MED ORDER — LOPERAMIDE HCL 1 MG/7.5ML PO SUSP
2.0000 mg | ORAL | Status: DC | PRN
  Administered 2019-09-30: 2 mg
  Filled 2019-09-29 (×2): qty 15

## 2019-09-29 MED ORDER — ALPRAZOLAM 0.5 MG PO TABS: 0.2500 mg | ORAL_TABLET | Freq: Three times a day (TID) | ORAL | Status: DC | PRN

## 2019-09-29 MED ORDER — GLUCERNA 1.2 CAL PO LIQD
1000.0000 mL | ORAL | Status: DC
  Administered 2019-09-29: 18:00:00 1000 mL
  Filled 2019-09-29 (×6): qty 1000

## 2019-09-29 MED ORDER — POTASSIUM CHLORIDE 20 MEQ/15ML (10%) PO SOLN
20.0000 meq | Freq: Three times a day (TID) | ORAL | Status: AC
  Administered 2019-09-29 (×2): 20 meq
  Filled 2019-09-29 (×2): qty 15

## 2019-09-29 MED ORDER — INSULIN GLARGINE 100 UNIT/ML ~~LOC~~ SOLN
10.0000 [IU] | Freq: Every day | SUBCUTANEOUS | Status: DC
  Administered 2019-09-29 – 2019-09-30 (×2): 10 [IU] via SUBCUTANEOUS
  Filled 2019-09-29 (×3): qty 0.1

## 2019-09-29 MED ORDER — GUAIFENESIN-DM 100-10 MG/5ML PO SYRP: 5.0000 mL | ORAL_SOLUTION | ORAL | Status: DC | PRN

## 2019-09-29 MED ORDER — QUETIAPINE FUMARATE 25 MG PO TABS
12.5000 mg | ORAL_TABLET | Freq: Every day | ORAL | Status: DC
  Administered 2019-09-29 – 2019-10-01 (×3): 12.5 mg
  Filled 2019-09-29 (×3): qty 1

## 2019-09-29 MED ORDER — HEPARIN (PORCINE) 25000 UT/250ML-% IV SOLN
800.0000 [IU]/h | INTRAVENOUS | Status: DC
  Administered 2019-09-29: 15:00:00 800 [IU]/h via INTRAVENOUS
  Filled 2019-09-29: qty 250

## 2019-09-29 NOTE — Procedures (Signed)
Patient Name: [name]  MRN: [id number]  Epilepsy Attending: Lora Havens  Referring Physician/Provider: Dr. Kerney Elbe Date: 10/25/20290 Duration: 25.24 minutes  Patient history: 73 year old female with acute encephalopathy and myoclonus.  EEG to evaluate for seizure.  Level of alertness: Awake  AEDs during EEG study: None  Technical aspects: This EEG study was done with scalp electrodes positioned according to the 10-20 International system of electrode placement. Electrical activity was acquired at a sampling rate of 500Hz  and reviewed with a high frequency filter of 70Hz  and a low frequency filter of 1Hz . EEG data were recorded continuously and digitally stored.   Description: EEG showed continuous generalized 2 to 3 Hz rhythmic delta slowing, maximal posterior quadrant. Hyperventilation and photic stimulation were not performed.  Patient was noted to have bilateral eyebrow twitching per eeg tech annotation.  Concomitant EEG recording and after the event did not show any changes suggest seizure.  Abnormality -Continuous rhythmic slow, generalized, maximum posterior quadrant  IMPRESSION: This study is suggestive of cortical dysfunction in posterior head region, nonspecific to etiology.  Additionally there is evidence of moderate to severe diffuse encephalopathy. No seizures or definite epileptiform discharges were seen throughout the recording.  Patient was noted to have bilateral eyebrow twitching without any concomitant EEG changes suggest seizure.

## 2019-09-29 NOTE — Progress Notes (Signed)
Occupational Therapy Treatment Patient Details Name: [name] MRN: [id number] DOB: 04-Mar-1946 Today's Date: 09/29/2019    History of present illness 73 y.o. 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 Patient was discharged home 10/16 last week from [facility] for COVID-19 viral gastroenteritis, AKI on CKD, and pneumonitis with acute hypoxic respiratory failure.  She was treated with a course of Rocephin, azithromycin as well as IV steroids and remdesivir. MRI 10/24 - no acute change. Trasnferred to ICU 10/24;intubated. Extubated 10/25. EEG 10/25 diffuse encephalopathy.    OT comments  Although confused, Pt with improved cognition and ability to participate with OT as compared to session on Friday. VSS throughout session. Pt able to progress to EOB with Max A and sit - stand x 4 with +2 Max A (Mod A on last trial). Pt attempting to help with grooming, however limited by weakness and "jerking movements". Pt will need rehab at SNF to maximize functional level of independence. Will continue to follow acutely.   Follow Up Recommendations  SNF;Supervision/Assistance - 24 hour    Equipment Recommendations  3 in 1 bedside commode;Tub/shower seat    Recommendations for Other Services PT consult    Precautions / Restrictions Precautions Precautions: Fall Precaution Comments: watch sats with activity       Mobility Bed Mobility Overal bed mobility: Needs Assistance Bed Mobility: Supine to Sit;Sit to Supine     Supine to sit: Max assist;+2 for physical assistance Sit to supine: Mod assist;+2 for physical assistance   General bed mobility comments: Pt assisting by initiating  reuturn to supine. Pt stating, I'm tired, I'm ready to lay down  Transfers Overall transfer level: Needs assistance   Transfers: Sit to/from Stand Sit to Stand: Max assist;+2 physical assistance         General transfer comment: Sit - stnad x 4. Last trial, pt mod A +2, pt counting to assist with standing.    Balance Overall balance assessment: Needs assistance   Sitting balance-Leahy Scale: Fair       Standing balance-Leahy Scale: Poor Standing balance comment: +2 HHA to stand; jerking movements similar to knees buckling                           ADL either performed or assessed with clinical judgement   ADL Overall ADL's : Needs assistance/impaired Eating/Feeding: NPO   Grooming: Maximal assistance;Sitting Grooming Details (indicate cue type and reason): until to maintain grip on toothbrush or washcloth; jerking type movements wiht initiation Upper Body Bathing: Maximal assistance;Bed level                           Functional mobility during ADLs: +2 for physical assistance;Moderate assistance General ADL Comments: Pt aware of being incontinent and asking for help; poor awareness of scooting close to EOB     Vision       Perception     Praxis      Cognition Arousal/Alertness: Awake/alert Behavior During Therapy: Restless;Flat affect;Impulsive Overall Cognitive Status: Impaired/Different from baseline                   Orientation Level: Disoriented to;Place;Time;Situation Current Attention Level: Sustained Memory: Decreased  recall of precautions;Decreased short-term memory Following Commands: Follows one step commands with increased time Safety/Judgement: Decreased awareness of safety;Decreased awareness of deficits Awareness: Intellectual Problem Solving: Slow processing;Decreased initiation;Difficulty sequencing;Requires verbal cues;Requires tactile cues General Comments: Improved cognition as compared to session on Friday.  Aware of being wet and wanting to change pad        Exercises Exercises: General Upper Extremity General Exercises - Upper Extremity Shoulder Extension: Strengthening;10 reps;Both;Supine Elbow Flexion: Strengthening;10 reps;Supine Other Exercises Other Exercises: ankle pumps x 10 reps  Other Exercises: seated marching x 10   Shoulder Instructions       General Comments      Pertinent Vitals/ Pain       Pain Assessment: Faces Faces Pain Scale: Hurts little more Pain Location: generalized Pain Descriptors / Indicators: Grimacing;Guarding;Moaning Pain Intervention(s): Limited activity within patient's tolerance  Home Living                                          Prior Functioning/Environment              Frequency  Min 2X/week        Progress Toward Goals  OT Goals(current goals can now be found in the care plan section)  Progress towards OT goals: Progressing toward goals  Acute Rehab OT Goals Patient Stated Goal: to get warm OT Goal Formulation: Patient unable to participate in goal setting Time For Goal Achievement: 10/10/19 Potential to Achieve Goals: Good ADL Goals Pt Will Perform Eating: with set-up;sitting Pt Will Perform Grooming: with set-up;sitting Pt Will Perform Upper Body Bathing: with set-up;sitting Pt Will Perform Lower Body Dressing: with set-up;with supervision;sit to/from stand Pt Will Transfer to Toilet: with min assist;bedside commode Pt Will Perform Toileting - Clothing Manipulation and hygiene: with set-up;with supervision;sit to/from stand;sitting/lateral leans Additional ADL Goal #1: Pt will consistently follow 1 step comands in nondistracting enviornment  Plan Discharge plan remains appropriate    Co-evaluation                 AM-PAC OT "6 Clicks" Daily Activity     Outcome Measure   Help from another person eating meals?: A Lot Help from another person taking care of personal grooming?: A Lot Help  from another person toileting, which includes using toliet, bedpan, or urinal?: Total Help from another person bathing (including washing, rinsing, drying)?: A Lot Help from another person to put on and taking off regular upper body clothing?: A Lot Help from another person to put on and taking off regular lower body clothing?: Total 6 Click Score: 10    End of Session Equipment Utilized During Treatment: Oxygen  OT Visit Diagnosis: Unsteadiness on feet (R26.81);Other abnormalities of gait and mobility (R26.89);Muscle weakness (generalized) (M62.81);Other symptoms and signs involving cognitive function;Pain Pain - part of body: (general discomfort)   Activity Tolerance Patient tolerated treatment well   Patient Left in bed;with call bell/phone within reach;with family/visitor present;with bed alarm set   Nurse Communication Mobility status        Time: [id number] OT Time Calculation (min): 25 min  Charges: OT General Charges $OT Visit: 1 Visit OT Treatments $Self Care/Home Management : 23-37 mins  Maurie Boettcher, OT/L   Acute OT Clinical Specialist [place] Pager [phone] Office [phone]    [facility] 09/29/2019, 4:13 PM

## 2019-09-29 NOTE — Progress Notes (Signed)
Patient refusing labs, keeps saying "no,No', swatting at lab tech.

## 2019-09-29 NOTE — Progress Notes (Signed)
NAME:  [name], MRN:  [id number], DOB:  1946-05-03, LOS: 3 ADMISSION DATE:  09/06/2019, CONSULTATION DATE:  10/23 REFERRING MD:  Sloan Leiter, CHIEF COMPLAINT:  Dyspnea   Brief History   73 y/o female admitted on 10/22 to [facility] for lethargy and pulmonary new pulmonary infiltrates that developed one week after she was treated for COVID pneumonia.  Past Medical History  DM2 PCP pneumonia 1998 HTN HIV Hep C GERD ESRD s/p renal transplant CHF > LV normal, moderate concentric LVH, LVEF 60-65%, left atrium moderately dilated  Significant Hospital Events   10/22 admission 10/24 moved to ICU for declining mental status, intubated, bronchoscopy performed 10/25 extubated  Consults:  Pulmonary   Procedures:  10/24 ETT > 10/25  Significant Diagnostic Tests:  10/23 CT chest images personally reviewed> patchy and nodular consolidation bilaterally predominantly in upper lobes and periphery but upper lobe predeominant bronchovascular distribution some patchy ggo in bases with pleural effusions bilaterally, ? Cavitary lesion RLL 10/22 CT head > NAICP 10/24 MRI Brain > no acute findings, motion artifact 10/25 EEG > suggestive of cortical dysfunction in posterior head region, non-specific etiology, moderate to severe diffuse encephalopathy, no seizures  Micro Data:  10/23 respiratory viral panel > negative 10/24 BAL bacterial > rare GNR on stain 10/24 BAL fungal >  10/24 BAL AFB >  10/24 BAL aspergillus antigen >  10/24 BAL PCP >    Antimicrobials:  10/22 vanc > 10/25 10/22 cefepime >   Interim history/subjective:   Extubated yesterday, tolerated well Stood at bedside with PT  Objective   Blood pressure (!) 151/57, pulse 66, temperature 98.6 F (37 C), temperature source Axillary, resp. rate (!) 30, height _0  (1.6 m), weight 64.2 kg, SpO2 97 %.        Intake/Output Summary (Last 24 hours) at 09/29/2019 1545 Last data filed at 09/29/2019 0600 Gross per 24 hour  Intake  682.08 ml  Output 200 ml  Net 482.08 ml   Filed Weights   09/10/2019 1421 09/26/19 0520  Weight: 65.8 kg 64.2 kg    Examination:  General:  Resting comfortably in bed HENT: NCAT OP clear PULM: CTA B, normal effort CV: RRR, no mgr GI: BS+, soft, nontender MSK: normal bulk and tone Neuro: awake, still somewhat confused but follows commands, no distress, MAEW  10/24 BAL cytology still pending  Resolved Hospital Problem list     Assessment & Plan:  Acute encephalopathy, improved  most likely toxic metabolic, MRI brain negative, with myoclonus Neurology input appreciated Continue thiamine Minimize sedating medicines Frequent orientation  Bilateral infiltrates in immunocompromised host who has recovered from COVID: unclear etiology, lymphocytic predominant, unlikely to represent sarcoidosis or hypersensitivity pneumonitis; B cell lymphoma still a concern but rapidity of time course too quick, is this a post COVID inflammatory infiltrate? Case report notes high lymph count in COVID [email or web address] Would transition to prednisone 86m daily and continue until after discharge Will need outpatient CXR and pulmonary follow up Follow up BAL cytology  Renal transplant/CKD Continue tacrolimus and cellcept Monitor BMET and UOP Replace electrolytes as needed  Need for sedation: resolved Stop sedation protocol  PCCM will sign off, call if questions  Best practice:  Diet: NPO after midnight Pain/Anxiety/Delirium protocol (if indicated): n/a VAP protocol (if indicated): n/a DVT prophylaxis: sub q heparin GI prophylaxis: n/a Glucose control: per TRH Mobility: bed rest Code Status: full Family Communication: will clarify with TRH who will call family today Disposition: move to PCU  Labs   CBC:  Recent Labs  Lab 09/15/2019 1634 09/26/19 0827 09/27/19 0334 09/27/19 1450 09/28/19 0420 09/28/19 0500 09/29/19 0855  WBC 6.5 6.6 10.2   --   --  12.4* 7.4  NEUTROABS 5.4  --   --   --   --  10.9* 6.1  HGB 9.9* 10.4* 8.2* 7.5* 7.8* 8.7* 7.5*  HCT 31.6* 32.8* 25.8* 22.0* 23.0* 27.3* 23.5*  MCV 92.1 89.1 87.8  --   --  89.8 88.7  PLT 162 147* 136*  --   --  130* 83*    Basic Metabolic Panel: Recent Labs  Lab 09/26/19 0827 09/27/19 0334 09/27/19 1447 09/27/19 1450 09/28/19 0420 09/28/19 0500 09/29/19 0855  NA 148* 151* 152* 153* 150* 146* 144  K 4.7 4.2 3.7 3.6 3.3* 3.7 3.3*  CL 122* 123* 123*  --   --  116* 116*  CO2 10* 16* 21*  --   --  21* 21*  GLUCOSE 349* 271* 197*  --   --  167* 120*  BUN 42* 46* 44*  --   --  42* 42*  CREATININE 2.75* 2.64* 2.92*  --   --  2.88* 2.87*  CALCIUM 9.6 9.5 9.5  --   --  9.7 8.9  MG  --   --  2.0  --   --  2.0 1.8   GFR: Estimated Creatinine Clearance: 16.4 mL/min (A) (by C-G formula based on SCr of 2.87 mg/dL (H)). Recent Labs  Lab 09/24/2019 1634 09/26/19 0827 09/27/19 0334 09/27/19 0500 09/28/19 0500 09/29/19 0855  PROCALCITON  --  0.18  --  0.18 0.20 0.32  WBC 6.5 6.6 10.2  --  12.4* 7.4  LATICACIDVEN 1.9  --   --   --   --   --     Liver Function Tests: Recent Labs  Lab 09/15/2019 1634 09/26/19 0827 09/27/19 0334 09/28/19 0500 09/29/19 0855  AST 14* 14* 15 21 12*  ALT _0 ALKPHOS 57 59 46 69 52  BILITOT 1.0 1.2 0.7 0.5 0.2*  PROT 5.8* 7.1 5.8* 6.5 5.3*  ALBUMIN 2.3* 2.8* 2.3* 2.5* 2.1*   No results for input(s): LIPASE, AMYLASE in the last 168 hours. Recent Labs  Lab 09/27/19 0334 09/29/19 0855  AMMONIA 21 22    ABG    Component Value Date/Time   PHART 7.391 09/28/2019 0420   PCO2ART 34.1 09/28/2019 0420   PO2ART 82.0 (L) 09/28/2019 0420   HCO3 20.9 09/28/2019 0420   TCO2 22 09/28/2019 0420   ACIDBASEDEF 4.0 (H) 09/28/2019 0420   O2SAT 96.0 09/28/2019 0420     Coagulation Profile: Recent Labs  Lab 09/29/19 0855  INR 1.1    Cardiac Enzymes: No results for input(s): CKTOTAL, CKMB, CKMBINDEX, TROPONINI in the last 168  hours.  HbA1C: Hgb A1c MFr Bld  Date/Time Value Ref Range Status  09/27/2019 03:34 AM 7.7 (H) 4.8 - 5.6 % Final    Comment:    (NOTE) Pre diabetes:          5.7%-6.4% Diabetes:              >6.4% Glycemic control for   <7.0% adults with diabetes   06/17/2018 07:55 PM 6.9 (H) 4.8 - 5.6 % Final    Comment:    (NOTE) Pre diabetes:          5.7%-6.4% Diabetes:              >6.4% Glycemic control for   <7.0%  adults with diabetes     CBG: Recent Labs  Lab 09/28/19 2021 09/28/19 2322 09/29/19 0339 09/29/19 0851 09/29/19 1212  GLUCAP 277* 246* 127* 83 149*     Critical care time: n/a     Roselie Awkward, MD [place] [facility] Pager: [phone] Cell: [phone] If no response, call [phone]

## 2019-09-29 NOTE — Progress Notes (Signed)
S/w dtr Lavella Lemons ~0900 and again at ~1515 at which time we used ELINK to video chat.   Pt was up with PT/OT to side of bed and stood 3 times.  SLP at bedside but pt coughed after each of 3 tries with water.  Will continue to assess.  In meantime will restart tube feeds.   Heparin gtt was started in afternoon as part of anticoag study on COVID patients.   Pt became restless ~1700 and then tearful.  MD put in [place].

## 2019-09-29 NOTE — Progress Notes (Signed)
Nutrition Follow-up / Consult RD working remotely.  DOCUMENTATION CODES:   Not applicable  INTERVENTION:    Glucerna 1.2 at 30 ml/h, increase by 10 ml every 4 hours to goal rate of 70 ml/h (1680 ml per day)   Provides 2016 kcal, 101 gm protein, 1352 ml free water daily  NUTRITION DIAGNOSIS:   Increased nutrient needs related to acute illness as evidenced by estimated needs.  Ongoing   GOAL:   Patient will meet greater than or equal to 90% of their needs  Being addressed with TF initiation  MONITOR:   TF tolerance, Diet advancement, Labs, Weight trends  REASON FOR ASSESSMENT:   Consult Enteral/tube feeding initiation and management  ASSESSMENT:   73 y/o female admitted on 10/22 to [facility] for lethargy and pulmonary new pulmonary infiltrates that developed one week after she was treated for COVID pneumonia. PMH includes HIV, ESRD/HD S/P renal transplant (now with CKD-IV), DM-2, CHF, Hepatitis C, HTN, HLD.   Extubated on 10/25. S/P bedside swallow evaluation with SLP this morning. Patient with dysphagia and increasd risk for aspiration. Remains NPO. NGT placed by RN. Received MD Consult for TF initiation and management.  Labs reviewed. Potassium 3.3 (L) CBG's: [phone]  Medications reviewed and include Calcitriol, Sensipar, novolog, lantus, solumedrol, Mag-ox, KCl.    Diet Order:   Diet Order            Diet NPO time specified  Diet effective now              EDUCATION NEEDS:   Not appropriate for education at this time  Skin:  Skin Assessment: Reviewed RN Assessment  Last BM:  10/26 type 6  Height:   Ht Readings from Last 1 Encounters:  09/26/19 5\' 3"  (1.6 m)    Weight:   Wt Readings from Last 1 Encounters:  09/26/19 64.2 kg    Ideal Body Weight:  52.3 kg  BMI:  Body mass index is 25.07 kg/m.  Estimated Nutritional Needs:   Kcal:  1950-2150  Protein:  90-110 gm  Fluid:  2L/day    Molli Barrows, RD, LDN, [place] Pager  [phone] After Hours Pager [phone]

## 2019-09-29 NOTE — Progress Notes (Addendum)
[name]  MVE:[id number] DOB: Apr 09, 1946 DOA: 09/30/2019 PCP: Earlene Plater, MD    Brief Narrative:  73 year old with a history of renal transplantation, chronic kidney disease stage IV, HIV on antiretrovirals, chronic hepatitis C, and DM 2 who was hospitalized at [facility] 10/9 > 10/16 for Covid gastroenteritis and pneumonitis complicated by acute kidney injury.  She was referred back to the ED 10/22 by her PCP with complaints of weakness and lethargy.  In the ED she was found to have worsening infiltrates on a CXR and was admitted to [facility].  Significant Events: 10/9 > 10/16 admit to [facility] w/ COVID gastroenteritis and PNA 10/22 re-admit to [facility] via [facility] ED 10/24 transfer to ICU with altered mental status -intubated 10/24 MRI brain -no acute findings 10/25 extubated 10/25 EEG -posterior cortical dysfunction of nonspecific etiology with evidence of moderate to severe diffuse encephalopathy  COVID-19 specific Treatment: none this admission  Subjective: Alert.  Will follow only some simple commands.  Does not respond to questions.  Does not attempt to speak to the examiner.  Stable off ventilator.  Assessment & Plan:  Pneumonitis/bilateral pulmonary infiltrates Etiology unclear -lymphocytic predominant as per BAL specimen - PCCM following - completed course of remdesivir during her last hospitalization -stable respiratory status on room air  Acute metabolic encephalopathy MRI brain unrevealing - perhaps purely metabolic/related to DKA - EEG without acute helpful findings - appreciate Neurology input - on thiamine  Dysphagia Did very poorly with SLP evaluation -keeping n.p.o. for now and providing nutrition/meds per NG tube -likely related to altered mental status - may need cortrak placed (instead of current NG) if not improved by Wednesday   Myoclonic jerks EEG without evidence of seizure activity - appreciate Neurology input  DM2 with  severe hyperglycemia/possible early DKA DKA resolved with normal anion gap -CBG trending back up -adjust treatment and follow  Hypernatremia Continue to expand free water and follow  Hypokalemia Supplement and recheck in a.m. -magnesium is okay  Low TSH  Check FT4 and FT3  HIV Continue usual outpatient medical therapy  Status post renal transplant with CKD stage IV Continue usual doses of CellCept and tacrolimus -pharmacy overseeing -creatinine stable  Recent Labs  Lab 09/26/19 0827 09/27/19 0334 09/27/19 1447 09/28/19 0500 09/29/19 0855  CREATININE 2.75* 2.64* 2.92* 2.88* 2.87*    Normocytic anemia Likely due to chronic kidney disease -no evidence of acute blood loss -follow intermittently -iron studies not consistent with iron deficiency  HTN Blood pressure currently controlled  HLD  GERD  Deconditioning/debility PT/OT consultations  DVT prophylaxis: Subcutaneous heparin Code Status: FULL CODE Family Communication: Per PCCM Disposition Plan: Transfer to PCU -PT/OT -follow mental status  Consultants:  PCCM Neurology  Antimicrobials:  Vancomycin 10/22 > 10/24 Cefepime 10/22 >  Objective: Blood pressure (!) 121/41, pulse 71, temperature 98.4 F (36.9 C), temperature source Axillary, resp. rate (!) 27, height _0  (1.6 m), weight 64.2 kg, SpO2 94 %.  Intake/Output Summary (Last 24 hours) at 09/29/2019 1213 Last data filed at 09/29/2019 0600 Gross per 24 hour  Intake 825.59 ml  Output 200 ml  Net 625.59 ml   Filed Weights   10/04/2019 1421 09/26/19 0520  Weight: 65.8 kg 64.2 kg    Examination: General: No acute respiratory distress -alert but confused Lungs: Fine crackles diffusely without wheezing Cardiovascular: RRR without murmur or rub Abdomen: NT/ND, soft, BS positive, no rebound Extremities: Trace edema bilateral lower extremities  CBC: Recent Labs  Lab 09/23/2019  1634  09/27/19 0334  09/28/19 0420 09/28/19 0500 09/29/19 0855  WBC  6.5   < > 10.2  --   --  12.4* 7.4  NEUTROABS 5.4  --   --   --   --  10.9* 6.1  HGB 9.9*   < > 8.2*   < > 7.8* 8.7* 7.5*  HCT 31.6*   < > 25.8*   < > 23.0* 27.3* 23.5*  MCV 92.1   < > 87.8  --   --  89.8 88.7  PLT 162   < > 136*  --   --  130* 83*   < > = values in this interval not displayed.   Basic Metabolic Panel: Recent Labs  Lab 09/27/19 1447  09/28/19 0420 09/28/19 0500 09/29/19 0855  NA 152*   < > 150* 146* 144  K 3.7   < > 3.3* 3.7 3.3*  CL 123*  --   --  116* 116*  CO2 21*  --   --  21* 21*  GLUCOSE 197*  --   --  167* 120*  BUN 44*  --   --  42* 42*  CREATININE 2.92*  --   --  2.88* 2.87*  CALCIUM 9.5  --   --  9.7 8.9  MG 2.0  --   --  2.0 1.8   < > = values in this interval not displayed.   GFR: Estimated Creatinine Clearance: 16.4 mL/min (A) (by C-G formula based on SCr of 2.87 mg/dL (H)).  Liver Function Tests: Recent Labs  Lab 09/26/19 0827 09/27/19 0334 09/28/19 0500 09/29/19 0855  AST 14* 15 21 12*  ALT _0 ALKPHOS 59 46 69 52  BILITOT 1.2 0.7 0.5 0.2*  PROT 7.1 5.8* 6.5 5.3*  ALBUMIN 2.8* 2.3* 2.5* 2.1*    Recent Labs  Lab 09/27/19 0334 09/29/19 0855  AMMONIA 21 22    HbA1C: Hgb A1c MFr Bld  Date/Time Value Ref Range Status  09/27/2019 03:34 AM 7.7 (H) 4.8 - 5.6 % Final    Comment:    (NOTE) Pre diabetes:          5.7%-6.4% Diabetes:              >6.4% Glycemic control for   <7.0% adults with diabetes   06/17/2018 07:55 PM 6.9 (H) 4.8 - 5.6 % Final    Comment:    (NOTE) Pre diabetes:          5.7%-6.4% Diabetes:              >6.4% Glycemic control for   <7.0% adults with diabetes     CBG: Recent Labs  Lab 09/28/19 1151 09/28/19 1556 09/28/19 2021 09/28/19 2322 09/29/19 0851  GLUCAP 241* 274* 277* 246* 83    Recent Results (from the past 240 hour(s))  Respiratory Panel by PCR     Status: None   Collection Time: 09/26/19  7:56 AM   Specimen: Nasopharyngeal Swab; Respiratory  Result Value Ref Range Status    Adenovirus NOT DETECTED NOT DETECTED Final   Coronavirus 229E NOT DETECTED NOT DETECTED Final    Comment: (NOTE) The Coronavirus on the Respiratory Panel, DOES NOT test for the novel  Coronavirus (2019 nCoV)    Coronavirus HKU1 NOT DETECTED NOT DETECTED Final   Coronavirus NL63 NOT DETECTED NOT DETECTED Final   Coronavirus OC43 NOT DETECTED NOT DETECTED Final   Metapneumovirus NOT DETECTED NOT DETECTED Final   Rhinovirus /  Enterovirus NOT DETECTED NOT DETECTED Final   Influenza A NOT DETECTED NOT DETECTED Final   Influenza B NOT DETECTED NOT DETECTED Final   Parainfluenza Virus 1 NOT DETECTED NOT DETECTED Final   Parainfluenza Virus 2 NOT DETECTED NOT DETECTED Final   Parainfluenza Virus 3 NOT DETECTED NOT DETECTED Final   Parainfluenza Virus 4 NOT DETECTED NOT DETECTED Final   Respiratory Syncytial Virus NOT DETECTED NOT DETECTED Final   Bordetella pertussis NOT DETECTED NOT DETECTED Final   Chlamydophila pneumoniae NOT DETECTED NOT DETECTED Final   Mycoplasma pneumoniae NOT DETECTED NOT DETECTED Final    Comment: Performed at [facility], [place] [address] [place], [place] [address]  MRSA PCR Screening     Status: None   Collection Time: 09/26/19 11:17 AM   Specimen: Nasal Mucosa; Nasopharyngeal  Result Value Ref Range Status   MRSA by PCR NEGATIVE NEGATIVE Final    Comment:        The GeneXpert MRSA Assay (FDA approved for NASAL specimens only), is one component of a comprehensive MRSA colonization surveillance program. It is not intended to diagnose MRSA infection nor to guide or monitor treatment for MRSA infections. Performed at [facility], [place] [address], [place], [place] [address]   Culture, respiratory (non-expectorated)     Status: None (Preliminary result)   Collection Time: 09/27/19 12:40 PM   Specimen: Bronchoalveolar Lavage; Respiratory  Result Value Ref Range Status   Specimen Description   Final    BRONCHIAL ALVEOLAR LAVAGE  Performed at [place] [address], [place], [place] [address]    Special Requests   Final    Immunocompromised Performed at [facility], [place] [address], [place], [place] [address]    Gram Stain   Final    FEW WBC PRESENT,BOTH PMN AND MONONUCLEAR RARE GRAM NEGATIVE RODS    Culture   Final    CULTURE REINCUBATED FOR BETTER GROWTH Performed at [facility], [place] [address] [place], [place] [address]    Report Status PENDING  Incomplete     Scheduled Meds: . amLODipine  10 mg Per Tube Daily  . aspirin  81 mg Per Tube Daily  . calcitRIOL  0.25 mcg Per Tube Daily  . carvedilol  12.5 mg Per Tube BID WC  . Chlorhexidine Gluconate Cloth  6 each Topical Daily  . cinacalcet  60 mg Oral Daily  . dolutegravir  50 mg Oral Daily  . free water  200 mL Per Tube Q6H  . insulin aspart  0-9 Units Subcutaneous Q4H  . insulin glargine  14 Units Subcutaneous QHS  . lamiVUDine  100 mg Per Tube Daily  . magnesium oxide  400 mg Per Tube Daily  . methylPREDNISolone (SOLU-MEDROL) injection  30 mg Intravenous Daily  . Mycophenolate 725m (1299mml) = 83m37m750 mg Per Tube BID  . pantoprazole sodium  40 mg Per Tube Daily  . rosuvastatin  20 mg Per Tube Daily  . sodium bicarbonate  650 mg Per Tube BID  . tacrolimus  2 mg Oral Daily  . tacrolimus  3 mg Oral QHS   Continuous Infusions: . ceFEPime (MAXIPIME) IV Stopped (09/28/19 2200)  . heparin Stopped (09/29/19 1148)     LOS: 3 days   JefCherene AltesD [facility] Office  [phone] - Text Page per Amion  If 7PM-7AM, please contact night-coverage per Amion 09/29/2019, 12:13 PM

## 2019-09-29 NOTE — Research (Signed)
[place] PACT Informed Consent   Subject Name: [name]  Subject met inclusion and exclusion criteria.  The informed consent form, study requirements and expectations were reviewed with the subject and questions and concerns were addressed prior to the signing of the consent form.  The subject verbalized understanding of the trail requirements.  The subject agreed to participate in the [place] PACT  trial and signed the informed consent.  The informed consent was obtained prior to performance of any protocol-specific procedures for the subject.  A copy of the signed informed consent was given to the subject and a copy was placed in the subject's medical record.  Philemon Kingdom D 09/29/2019, 0900 am

## 2019-09-29 NOTE — Progress Notes (Signed)
Pharmacy Antibiotic Note  [name] is a 73 y.o. female admitted on 09/12/2019 with AMS/SOB and PNA s/p recent admission for COVID-19 PNA. Currently on D4 of Cefepime for pneumonia. Leukocytosis has normalized. Afebrile.   BAL is growing rare GNRs. Fungal assays are pending   Plan: Cefepime 2 g IV q24h. F/u LOT  F/u cultures   Height: _0  (160 cm) Weight: 141 lb 8.6 oz (64.2 kg) IBW/kg (Calculated) : 52.4  Temp (24hrs), Avg:98.7 F (37.1 C), Min:98.3 F (36.8 C), Max:99.5 F (37.5 C)  Recent Labs  Lab 09/06/2019 1634 09/26/19 0827 09/27/19 0334 09/27/19 1447 09/28/19 0500  WBC 6.5 6.6 10.2  --  12.4*  CREATININE 3.05* 2.75* 2.64* 2.92* 2.88*  LATICACIDVEN 1.9  --   --   --   --     Estimated Creatinine Clearance: 16.4 mL/min (A) (by C-G formula based on SCr of 2.88 mg/dL (H)).    Allergies  Allergen Reactions  . Omeprazole Other (See Comments)    Interferes with the absorption of rilipivirine  . Eggs Or Egg-Derived Products Nausea And Vomiting  . Influenza Vaccines Other (See Comments)    Hallucination    Albertina Parr, PharmD., BCPS Clinical Pharmacist Clinical phone for 09/29/19 until 5pm: [phone]

## 2019-09-29 NOTE — Evaluation (Signed)
Clinical/Bedside Swallow Evaluation Patient Details  Name: [name] MRN: [id number] Date of Birth: 05/12/1946  Today's Date: 09/29/2019 Time: SLP Start Time (ACUTE ONLY): 1108 SLP Stop Time (ACUTE ONLY): 1118 SLP Time Calculation (min) (ACUTE ONLY): 10 min  Past Medical History:  Past Medical History:  Diagnosis Date  . CHF (congestive heart failure) ([place])   . Chronic kidney disease    ESRD secondary to DM, started HD in 2008, Dr. Jimmy Footman is her nephrologist, received renal transplant in 2013, no longer on dialysis (06/17/2018)  . Dialysis patient [facility])    T Th Sat  . Early cataracts, bilateral   . GERD (gastroesophageal reflux disease)   . Hepatitis C    untreated. VL 3700000 in 2008; pt reports this has been treated" (06/17/2018)  . High cholesterol   . History of blood transfusion 2013   "when I got the kidney" (06/17/2018)  . History of hepatitis C 08/04/2015  . HIV infection ([place]) 1998   Dx in 1998 in [place], she presented with PCP pneumonia at that time. Has been tried on multiple regimens by her PCP in [place] before./ She has been on current ART for years now. Moved to [facility] in 2008 and is following with Dr. Tommy Medal since then.   . Hypertension   . Influenza-like illness 05/27/2019  . 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 PONV (postoperative nausea and vomiting)   . Type II diabetes mellitus ([place])   . Wears glasses    Past Surgical History:  Past Surgical History:  Procedure Laterality Date  . ABDOMINAL HYSTERECTOMY     and oopherectomy for fibroids  . BASCILIC VEIN TRANSPOSITION Left 01/29/2018   Procedure: RIGHT BRACIOCEPHALIC AV FISTULA;  Surgeon: Angelia Mould, MD;  Location: [place];  Service: Vascular;  Laterality: Left;  . CHOLECYSTECTOMY OPEN    . KIDNEY TRANSPLANT  [id number]   HPI:  73 year old with a history of renal transplantation, chronic kidney disease stage IV, HIV on antiretrovirals, chronic hepatitis C, and DM 2 who was hospitalized at [facility] 10/9 > 10/16 for Covid gastroenteritis and pneumonitis complicated by acute kidney injury.  She was referred back to the ED 10/22 by her PCP with complaints of weakness and lethargy.  In the ED she was found to have worsening infiltrates on a CXR and was admitted to [facility]. Transferred to ICU and intubated 10/24, extubated 10/25. Has NG.    Assessment / Plan / Recommendation Clinical Impression  Pt was seen for a clinical swallow evaluation.  Oral care provided; oral mechanism exam normal.  Pt demonstrated multiple s/s of dysphagia with risk for aspiration marked by consistent coughing after teaspoon trials of thin liquid; multiple sub-swallows per bolus; wet phonation. No further POs trialed.  Recommend continuing NPO for today; pt may have occasional ice chips; continue TF.  SLP will follow for PO readiness. D/W RN.   SLP Visit Diagnosis: Dysphagia, unspecified (R13.10)    Aspiration Risk  Moderate aspiration risk    Diet Recommendation   npo; occasional ice chips after oral care       Other  Recommendations Oral Care Recommendations: Oral care QID;Oral care prior to ice chip/H20   Follow up Recommendations Other (comment)(tba)      Frequency and Duration min 3x week  2 weeks       Prognosis Prognosis for Safe Diet Advancement: Good      Swallow Study   General Date of Onset: 09/21/2019 HPI: 73 year old  with a history of renal transplantation, chronic kidney disease stage IV, HIV on antiretrovirals, chronic hepatitis C, and DM 2 who was hospitalized at [facility] 10/9 > 10/16 for Covid gastroenteritis and pneumonitis complicated by acute kidney injury.  She was referred back to the ED 10/22 by her PCP with complaints of weakness and lethargy.  In the ED she was found to have worsening infiltrates on a CXR and was admitted to [facility]. Transferred to ICU and intubated 10/24, extubated 10/25. Has NG.  Type of Study: Bedside Swallow  Evaluation Previous Swallow Assessment: no Diet Prior to this Study: NPO;NG Tube Temperature Spikes Noted: No Respiratory Status: Room air History of Recent Intubation: Yes Length of Intubations (days): 1 days Date extubated: 09/27/19 Behavior/Cognition: Alert Oral Cavity Assessment: Within Functional Limits Oral Care Completed by SLP: Yes Oral Cavity - Dentition: Missing dentition Vision: Functional for self-feeding Self-Feeding Abilities: Needs assist Patient Positioning: Upright in bed Baseline Vocal Quality: Low vocal intensity Volitional Cough: Weak Volitional Swallow: Able to elicit    Oral/Motor/Sensory Function Overall Oral Motor/Sensory Function: Within functional limits   Ice Chips Ice chips: Within functional limits   Thin Liquid Thin Liquid: Impaired Presentation: Spoon Pharyngeal  Phase Impairments: Wet Vocal Quality;Multiple swallows;Cough - Immediate    Nectar Thick Nectar Thick Liquid: Not tested   Honey Thick Honey Thick Liquid: Not tested   Puree Puree: Not tested   Solid     Solid: Not tested      [name] 09/29/2019,12:15 PM  Shaterria L. Tivis Ringer, [place] Office number [phone]

## 2019-09-29 NOTE — Progress Notes (Addendum)
ANTICOAGULATION CONSULT NOTE - Initial Consult  Pharmacy Consult for Heparin Indication: Full dose-- COVID-PACT study  Allergies  Allergen Reactions  . Omeprazole Other (See Comments)    Interferes with the absorption of rilipivirine  . Eggs Or Egg-Derived Products Nausea And Vomiting  . Influenza Vaccines Other (See Comments)    Hallucination    Patient Measurements: Height: 5\' 3"  (160 cm) Weight: 141 lb 8.6 oz (64.2 kg) IBW/kg (Calculated) : 52.4 Heparin Dosing Weight: 64 kg  Vital Signs: Temp: 98.4 F (36.9 C) (10/26 0353) Temp Source: Oral (10/26 0353) BP: 126/36 (10/26 0700) Pulse Rate: 63 (10/26 0700)  Labs: Recent Labs    09/27/19 0334 09/27/19 1447 09/27/19 1450 09/28/19 0420 09/28/19 0500  HGB 8.2*  --  7.5* 7.8* 8.7*  HCT 25.8*  --  22.0* 23.0* 27.3*  PLT 136*  --   --   --  130*  CREATININE 2.64* 2.92*  --   --  2.88*    Estimated Creatinine Clearance: 16.4 mL/min (A) (by C-G formula based on SCr of 2.88 mg/dL (H)).   Medical History: Past Medical History:  Diagnosis Date  . CHF (congestive heart failure) ([place])   . Chronic kidney disease    ESRD secondary to DM, started HD in 2008, Dr. Jimmy Footman is her nephrologist, received renal transplant in 2013, no longer on dialysis (06/17/2018)  . Dialysis patient [facility])    T Th Sat  . Early cataracts, bilateral   . GERD (gastroesophageal reflux disease)   . Hepatitis C    untreated. VL 3700000 in 2008; pt reports this has been treated" (06/17/2018)  . High cholesterol   . History of blood transfusion 2013   "when I got the kidney" (06/17/2018)  . History of hepatitis C 08/04/2015  . HIV infection ([place]) 1998   Dx in 1998 in [place], she presented with PCP pneumonia at that time. Has been tried on multiple regimens by her PCP in [place] before./ She has been on current ART for years now. Moved to [facility] in 2008 and is following with Dr. Tommy Medal since then.   . Hypertension   . Influenza-like illness 05/27/2019  . PCP  (pneumocystis carinii pneumonia) ([place]) 1998  . PONV (postoperative nausea and vomiting)   . Type II diabetes mellitus ([place])   . Wears glasses    Assessment: 73 y/o F admitted with a h/o renal transplant, HIV, chronic hep C, and DM initially at [facility] from 10/9 to 10/16 with COVID gastroenteritis and pneumonitis who was readmitted to [facility] 2/2 weakness and lethargy with worsening infiltrates on CXR. Patient was initially on DVT prophylaxis with SQH 7500 units q 8 h with plan to enroll in COVID-PACT study full-dose arm with IV UFH. Patient was on ASA PTA and not a candidate for the second randomization.  Last dose of SQH this AM. INR 1.1, aPTT 84. Hgb 7.5, plt 83.    Goal of Therapy:  Heparin level 0.3-0.7 units/ml Monitor platelets by anticoagulation protocol: Yes   Plan:  - Heparin infusion at 800 units/hr without bolus - Will consider targeting lower end of goal range with thrombocytopenia - Check HL in 8 hours - Follow PLT closely. - Daily CBC/HL - Monitor for s/s of bleeding   Ulice Dash D 09/29/2019,10:49 AM

## 2019-09-29 NOTE — Progress Notes (Signed)
2200-Transferred to the floor

## 2019-09-30 ENCOUNTER — Inpatient Hospital Stay (HOSPITAL_COMMUNITY): Payer: Medicare Other

## 2019-09-30 DIAGNOSIS — U071 COVID-19: Secondary | ICD-10-CM | POA: Diagnosis not present

## 2019-09-30 DIAGNOSIS — R4182 Altered mental status, unspecified: Secondary | ICD-10-CM | POA: Diagnosis not present

## 2019-09-30 DIAGNOSIS — M7989 Other specified soft tissue disorders: Secondary | ICD-10-CM | POA: Diagnosis not present

## 2019-09-30 DIAGNOSIS — R579 Shock, unspecified: Secondary | ICD-10-CM

## 2019-09-30 DIAGNOSIS — R7989 Other specified abnormal findings of blood chemistry: Secondary | ICD-10-CM | POA: Diagnosis not present

## 2019-09-30 DIAGNOSIS — G9340 Encephalopathy, unspecified: Secondary | ICD-10-CM

## 2019-09-30 DIAGNOSIS — J9601 Acute respiratory failure with hypoxia: Secondary | ICD-10-CM

## 2019-09-30 LAB — COMPREHENSIVE METABOLIC PANEL
ALT: 13 U/L (ref 0–44)
AST: 13 U/L — ABNORMAL LOW (ref 15–41)
Albumin: 2.1 g/dL — ABNORMAL LOW (ref 3.5–5.0)
Alkaline Phosphatase: 53 U/L (ref 38–126)
Anion gap: 9 (ref 5–15)
BUN: 41 mg/dL — ABNORMAL HIGH (ref 8–23)
CO2: 17 mmol/L — ABNORMAL LOW (ref 22–32)
Calcium: 8.6 mg/dL — ABNORMAL LOW (ref 8.9–10.3)
Chloride: 116 mmol/L — ABNORMAL HIGH (ref 98–111)
Creatinine, Ser: 3.04 mg/dL — ABNORMAL HIGH (ref 0.44–1.00)
GFR calc Af Amer: 17 mL/min — ABNORMAL LOW (ref >60)
GFR calc non Af Amer: 15 mL/min — ABNORMAL LOW (ref >60)
Glucose, Bld: 224 mg/dL — ABNORMAL HIGH (ref 70–99)
Potassium: 4.3 mmol/L (ref 3.5–5.1)
Sodium: 142 mmol/L (ref 135–145)
Total Bilirubin: 0.6 mg/dL (ref 0.3–1.2)
Total Protein: 5.2 g/dL — ABNORMAL LOW (ref 6.5–8.1)

## 2019-09-30 LAB — CBC WITH DIFFERENTIAL/PLATELET
Abs Immature Granulocytes: 0.05 10*3/uL (ref 0.00–0.07)
Basophils Absolute: 0 10*3/uL (ref 0.0–0.1)
Basophils Relative: 0 %
Eosinophils Absolute: 0 10*3/uL (ref 0.0–0.5)
Eosinophils Relative: 0 %
HCT: 22.6 % — ABNORMAL LOW (ref 36.0–46.0)
Hemoglobin: 7.1 g/dL — ABNORMAL LOW (ref 12.0–15.0)
Immature Granulocytes: 1 %
Lymphocytes Relative: 6 %
Lymphs Abs: 0.5 10*3/uL — ABNORMAL LOW (ref 0.7–4.0)
MCH: 28.4 pg (ref 26.0–34.0)
MCHC: 31.4 g/dL (ref 30.0–36.0)
MCV: 90.4 fL (ref 80.0–100.0)
Monocytes Absolute: 0.6 10*3/uL (ref 0.1–1.0)
Monocytes Relative: 7 %
Neutro Abs: 7.4 10*3/uL (ref 1.7–7.7)
Neutrophils Relative %: 86 %
Platelets: 85 10*3/uL — ABNORMAL LOW (ref 150–400)
RBC: 2.5 MIL/uL — ABNORMAL LOW (ref 3.87–5.11)
RDW: 14.7 % (ref 11.5–15.5)
WBC: 8.6 10*3/uL (ref 4.0–10.5)
nRBC: 0 % (ref 0.0–0.2)

## 2019-09-30 LAB — CULTURE, RESPIRATORY W GRAM STAIN: Culture: NORMAL

## 2019-09-30 LAB — DIC (DISSEMINATED INTRAVASCULAR COAGULATION)PANEL
D-Dimer, Quant: 1.87 ug/mL-FEU — ABNORMAL HIGH (ref 0.00–0.50)
Fibrinogen: 334 mg/dL (ref 210–475)
INR: 1.3 — ABNORMAL HIGH (ref 0.8–1.2)
Platelets: 110 10*3/uL — ABNORMAL LOW (ref 150–400)
Prothrombin Time: 16.1 seconds — ABNORMAL HIGH (ref 11.4–15.2)
Smear Review: NONE SEEN
aPTT: 40 seconds — ABNORMAL HIGH (ref 24–36)

## 2019-09-30 LAB — GLUCOSE, CAPILLARY
Glucose-Capillary: 211 mg/dL — ABNORMAL HIGH (ref 70–99)
Glucose-Capillary: 234 mg/dL — ABNORMAL HIGH (ref 70–99)
Glucose-Capillary: 257 mg/dL — ABNORMAL HIGH (ref 70–99)
Glucose-Capillary: 309 mg/dL — ABNORMAL HIGH (ref 70–99)
Glucose-Capillary: 343 mg/dL — ABNORMAL HIGH (ref 70–99)
Glucose-Capillary: 349 mg/dL — ABNORMAL HIGH (ref 70–99)
Glucose-Capillary: 366 mg/dL — ABNORMAL HIGH (ref 70–99)

## 2019-09-30 LAB — PROTIME-INR
INR: 1.2 (ref 0.8–1.2)
Prothrombin Time: 15.3 seconds — ABNORMAL HIGH (ref 11.4–15.2)

## 2019-09-30 LAB — CBC
HCT: 15.2 % — ABNORMAL LOW (ref 36.0–46.0)
Hemoglobin: 4.8 g/dL — CL (ref 12.0–15.0)
MCH: 28.7 pg (ref 26.0–34.0)
MCHC: 31.6 g/dL (ref 30.0–36.0)
MCV: 91 fL (ref 80.0–100.0)
Platelets: 108 10*3/uL — ABNORMAL LOW (ref 150–400)
RBC: 1.67 MIL/uL — ABNORMAL LOW (ref 3.87–5.11)
RDW: 15.3 % (ref 11.5–15.5)
WBC: 18.8 10*3/uL — ABNORMAL HIGH (ref 4.0–10.5)
nRBC: 0.3 % — ABNORMAL HIGH (ref 0.0–0.2)

## 2019-09-30 LAB — BRAIN NATRIURETIC PEPTIDE: B Natriuretic Peptide: 218.3 pg/mL — ABNORMAL HIGH (ref 0.0–100.0)

## 2019-09-30 LAB — PREPARE RBC (CROSSMATCH)

## 2019-09-30 LAB — T4, FREE: Free T4: 1.21 ng/dL — ABNORMAL HIGH (ref 0.61–1.12)

## 2019-09-30 LAB — HEPARIN LEVEL (UNFRACTIONATED): Heparin Unfractionated: 1.44 IU/mL — ABNORMAL HIGH (ref 0.30–0.70)

## 2019-09-30 LAB — PROCALCITONIN: Procalcitonin: 0.18 ng/mL

## 2019-09-30 MED ORDER — MIDAZOLAM HCL 2 MG/2ML IJ SOLN
1.0000 mg | INTRAMUSCULAR | Status: DC | PRN
  Administered 2019-10-02 – 2019-10-03 (×2): 1 mg via INTRAVENOUS
  Filled 2019-09-30 (×2): qty 2

## 2019-09-30 MED ORDER — FENTANYL BOLUS VIA INFUSION
25.0000 ug | INTRAVENOUS | Status: DC | PRN
  Filled 2019-09-30: qty 25

## 2019-09-30 MED ORDER — ACETAMINOPHEN 325 MG PO TABS
650.0000 mg | ORAL_TABLET | Freq: Once | ORAL | Status: AC
  Administered 2019-09-30: 650 mg via ORAL
  Filled 2019-09-30: qty 2

## 2019-09-30 MED ORDER — NOREPINEPHRINE 4 MG/250ML-% IV SOLN
0.0000 ug/min | INTRAVENOUS | Status: DC
  Administered 2019-09-30 (×2): 40 ug/min via INTRAVENOUS
  Filled 2019-09-30: qty 500

## 2019-09-30 MED ORDER — ETOMIDATE 2 MG/ML IV SOLN
20.0000 mg | Freq: Once | INTRAVENOUS | Status: AC
  Administered 2019-09-30: 20 mg via INTRAVENOUS

## 2019-09-30 MED ORDER — SODIUM CHLORIDE 0.9% IV SOLUTION
Freq: Once | INTRAVENOUS | Status: AC
  Administered 2019-09-30: 23:00:00 via INTRAVENOUS

## 2019-09-30 MED ORDER — SODIUM CHLORIDE 0.9% FLUSH
3.0000 mL | Freq: Two times a day (BID) | INTRAVENOUS | Status: DC
  Administered 2019-10-01 – 2019-10-05 (×10): 3 mL via INTRAVENOUS

## 2019-09-30 MED ORDER — FENTANYL CITRATE (PF) 100 MCG/2ML IJ SOLN
100.0000 ug | Freq: Once | INTRAMUSCULAR | Status: AC
  Administered 2019-09-30: 17:00:00 100 ug via INTRAVENOUS

## 2019-09-30 MED ORDER — FENTANYL 2500MCG IN NS 250ML (10MCG/ML) PREMIX INFUSION
25.0000 ug/h | INTRAVENOUS | Status: DC
  Administered 2019-09-30: 20:00:00 25 ug/h via INTRAVENOUS
  Filled 2019-09-30: qty 250

## 2019-09-30 MED ORDER — FENTANYL CITRATE (PF) 100 MCG/2ML IJ SOLN: 25.0000 ug | Freq: Once | INTRAMUSCULAR | Status: DC

## 2019-09-30 MED ORDER — MIDAZOLAM HCL 2 MG/2ML IJ SOLN
2.0000 mg | Freq: Once | INTRAMUSCULAR | Status: AC
  Administered 2019-09-30: 2 mg via INTRAVENOUS

## 2019-09-30 MED ORDER — DEXMEDETOMIDINE HCL IN NACL 400 MCG/100ML IV SOLN: 0.0000 ug/kg/h | INTRAVENOUS | Status: DC

## 2019-09-30 MED ORDER — SODIUM CHLORIDE 0.9 % IV BOLUS: 500.0000 mL | Freq: Once | INTRAVENOUS | Status: DC

## 2019-09-30 MED ORDER — SODIUM CHLORIDE 0.9 % IV SOLN: 250.0000 mL | INTRAVENOUS | Status: DC | PRN

## 2019-09-30 MED ORDER — HEPARIN SODIUM (PORCINE) 5000 UNIT/ML IJ SOLN
5000.0000 [IU] | Freq: Three times a day (TID) | INTRAMUSCULAR | Status: DC
  Administered 2019-09-30 – 2019-10-02 (×6): 5000 [IU] via SUBCUTANEOUS
  Filled 2019-09-30 (×6): qty 1

## 2019-09-30 MED ORDER — VECURONIUM BROMIDE 10 MG IV SOLR
10.0000 mg | Freq: Once | INTRAVENOUS | Status: AC
  Administered 2019-09-30: 10 mg via INTRAVENOUS

## 2019-09-30 MED ORDER — NOREPINEPHRINE 16 MG/250ML-% IV SOLN
0.0000 ug/min | INTRAVENOUS | Status: DC
  Administered 2019-09-30 – 2019-10-01 (×2): 40 ug/min via INTRAVENOUS
  Administered 2019-10-01: 30 ug/min via INTRAVENOUS
  Administered 2019-10-02: 16 ug/min via INTRAVENOUS
  Filled 2019-09-30 (×3): qty 250
  Filled 2019-09-30: qty 500

## 2019-09-30 MED ORDER — SODIUM CHLORIDE 0.9% FLUSH: 3.0000 mL | INTRAVENOUS | Status: DC | PRN

## 2019-09-30 MED ORDER — MIDAZOLAM HCL 2 MG/2ML IJ SOLN
1.0000 mg | INTRAMUSCULAR | Status: DC | PRN
  Administered 2019-10-03 – 2019-10-04 (×3): 1 mg via INTRAVENOUS
  Filled 2019-09-30 (×3): qty 2

## 2019-09-30 MED ORDER — DIPHENHYDRAMINE HCL 50 MG/ML IJ SOLN
25.0000 mg | Freq: Once | INTRAMUSCULAR | Status: AC
  Administered 2019-09-30: 25 mg via INTRAVENOUS
  Filled 2019-09-30: qty 1

## 2019-09-30 MED ORDER — LORAZEPAM 2 MG/ML IJ SOLN
INTRAMUSCULAR | Status: AC
  Filled 2019-09-30: qty 1

## 2019-09-30 MED ORDER — NOREPINEPHRINE 4 MG/250ML-% IV SOLN
INTRAVENOUS | Status: AC
  Administered 2019-09-30: 4 mg
  Filled 2019-09-30: qty 250

## 2019-09-30 MED FILL — Medication: Qty: 1 | Status: AC

## 2019-09-30 NOTE — Progress Notes (Addendum)
[name]  AGT:[id number] DOB: 26-Sep-1946 DOA: 09/27/2019 PCP: Earlene Plater, MD    Brief Narrative:  73 year old with a history of renal transplantation, chronic kidney disease stage IV, HIV on antiretrovirals, chronic hepatitis C, and DM 2 who was hospitalized at [facility] 10/9 > 10/16 for Covid gastroenteritis and pneumonitis complicated by acute kidney injury.  She was referred back to the ED 10/22 by her PCP with complaints of weakness and lethargy.  In the ED she was found to have worsening infiltrates on a CXR and was admitted to [facility].  Significant Events: 10/9 > 10/16 admit to [facility] [place] w/ COVID gastroenteritis and PNA 10/22 re-admit to [facility] via [facility] ED 10/24 transfer to ICU with altered mental status -intubated 10/24 MRI brain -no acute findings 10/25 extubated 10/25 EEG -posterior cortical dysfunction of nonspecific etiology with evidence of moderate to severe diffuse encephalopathy 10/27 right upper extremity venous Doppler significant for DVT. 10/27 transferred back to ICU secondary to respiratory distress and encephalopathy required intubation   COVID-19 specific Treatment: - none this admission  Subjective: Patient is altered and confused this morning, has increased work of breathing this afternoon where she was transferred to ICU with significant respiratory distress, required intubation  Assessment & Plan:  Pneumonitis/bilateral pulmonary infiltrates - Etiology unclear -lymphocytic predominant as per BAL specimen - PCCM following - completed course of remdesivir during her last hospitalization  -Was reintubated emergently, back on the vent, vent management and sedation per PCCM -Follow on BAL cytology  Acute metabolic encephalopathy MRI brain unrevealing - perhaps purely metabolic/related to DKA - EEG without acute helpful findings - appreciate Neurology input - on thiamine -Discussed with neurology, will reassess tomorrow  given she is intubated today and sedated. -Repeat CT head today to ensure no acute events  Acute right upper extremity DVT -Patient with swelling in right upper extremity, venous Doppler done today showing acute DVT, will resume on anticoagulation, but awaiting repeat CBC to ensure stable count, and to repeat CT head to ensure no active intracranial bleed.  Myoclonic jerks EEG without evidence of seizure activity - appreciate Neurology input  DM2 with severe hyperglycemia/possible early DKA DKA resolved with normal anion gap -CBG trending back up -adjust treatment and follow  Hypernatremia Continue to expand free water and follow  Hypokalemia Replete as needed  Low TSH  -Free T4 is mildly elevated, but at this point I would be very hesitant to start her on any methimazole  HIV Continue usual outpatient medical therapy  Status post renal transplant with CKD stage IV Continue usual doses of CellCept and tacrolimus -pharmacy overseeing -creatinine stable  Recent Labs  Lab 09/27/19 0334 09/27/19 1447 09/28/19 0500 09/29/19 0855 09/30/19 0415  CREATININE 2.64* 2.92* 2.88* 2.87* 3.04*    Normocytic anemia -She is with worsening hemoglobin over last 72 hours, no evidence of acute bleed, follow on Hemoccult, she was on heparin GTT as part of COVID-PACT study full-dose arm with IV UFH( only for last 24 hours), NGTD currently stopped, follow on Hemoccult, will repeat CBC and transfuse for hemoglobin less than 7.  Thrombocytopenia -Check HIT and DIC panel  HTN Blood pressure currently controlled  HLD  GERD  Deconditioning/debility PT/OT consultations  DVT prophylaxis: Subcutaneous heparin Code Status: FULL CODE Family Communication: Per PCCM Disposition Plan: Transfer to PCU -PT/OT -follow mental status  Consultants:  PCCM Neurology  Antimicrobials:  Vancomycin 10/22 > 10/24 Cefepime 10/22 >  Objective: Blood pressure (!) 123/95, pulse (!) 46, temperature  44 F  (36.7 C), temperature source Axillary, resp. rate (!) 24, height _0  (1.6 m), weight 65.4 kg, SpO2 100 %.  Intake/Output Summary (Last 24 hours) at 09/30/2019 1733 Last data filed at 09/29/2019 2200 Gross per 24 hour  Intake 157.31 ml  Output 450 ml  Net -292.69 ml   Filed Weights   09/19/2019 1421 09/26/19 0520 09/30/19 0500  Weight: 65.8 kg 64.2 kg 65.4 kg    Examination:  Patient is sedated, intubated, chronically ill-appearing  Coarse respiratory sounds bilaterally Regular rate and rhythm, no rubs murmurs gallops abdoment soft, nontender, nondistended Extremities with no edema, clubbing or cyanosis lower extremity, right upper extremity +2 edema, strong pulses felt with Dopplers.   CBC: Recent Labs  Lab 09/28/19 0500 09/29/19 0855 09/30/19 0415  WBC 12.4* 7.4 8.6  NEUTROABS 10.9* 6.1 7.4  HGB 8.7* 7.5* 7.1*  HCT 27.3* 23.5* 22.6*  MCV 89.8 88.7 90.4  PLT 130* 83* 85*   Basic Metabolic Panel: Recent Labs  Lab 09/27/19 1447  09/28/19 0500 09/29/19 0855 09/30/19 0415  NA 152*   < > 146* 144 142  K 3.7   < > 3.7 3.3* 4.3  CL 123*  --  116* 116* 116*  CO2 21*  --  21* 21* 17*  GLUCOSE 197*  --  167* 120* 224*  BUN 44*  --  42* 42* 41*  CREATININE 2.92*  --  2.88* 2.87* 3.04*  CALCIUM 9.5  --  9.7 8.9 8.6*  MG 2.0  --  2.0 1.8  --    < > = values in this interval not displayed.   GFR: Estimated Creatinine Clearance: 15.7 mL/min (A) (by C-G formula based on SCr of 3.04 mg/dL (H)).  Liver Function Tests: Recent Labs  Lab 09/27/19 0334 09/28/19 0500 09/29/19 0855 09/30/19 0415  AST 15 21 12* 13*  ALT _1 ALKPHOS 46 69 52 53  BILITOT 0.7 0.5 0.2* 0.6  PROT 5.8* 6.5 5.3* 5.2*  ALBUMIN 2.3* 2.5* 2.1* 2.1*    Recent Labs  Lab 09/27/19 0334 09/29/19 0855  AMMONIA 21 22    HbA1C: Hgb A1c MFr Bld  Date/Time Value Ref Range Status  09/27/2019 03:34 AM 7.7 (H) 4.8 - 5.6 % Final    Comment:    (NOTE) Pre diabetes:          5.7%-6.4%  Diabetes:              >6.4% Glycemic control for   <7.0% adults with diabetes   06/17/2018 07:55 PM 6.9 (H) 4.8 - 5.6 % Final    Comment:    (NOTE) Pre diabetes:          5.7%-6.4% Diabetes:              >6.4% Glycemic control for   <7.0% adults with diabetes     CBG: Recent Labs  Lab 09/29/19 2352 09/30/19 0428 09/30/19 0730 09/30/19 1134 09/30/19 1534  GLUCAP 234* 211* 257* 349* 343*    Recent Results (from the past 240 hour(s))  Respiratory Panel by PCR     Status: None   Collection Time: 09/26/19  7:56 AM   Specimen: Nasopharyngeal Swab; Respiratory  Result Value Ref Range Status   Adenovirus NOT DETECTED NOT DETECTED Final   Coronavirus 229E NOT DETECTED NOT DETECTED Final    Comment: (NOTE) The Coronavirus on the Respiratory Panel, DOES NOT test for the novel  Coronavirus (2019 nCoV)    Coronavirus HKU1  NOT DETECTED NOT DETECTED Final   Coronavirus NL63 NOT DETECTED NOT DETECTED Final   Coronavirus OC43 NOT DETECTED NOT DETECTED Final   Metapneumovirus NOT DETECTED NOT DETECTED Final   Rhinovirus / Enterovirus NOT DETECTED NOT DETECTED Final   Influenza A NOT DETECTED NOT DETECTED Final   Influenza B NOT DETECTED NOT DETECTED Final   Parainfluenza Virus 1 NOT DETECTED NOT DETECTED Final   Parainfluenza Virus 2 NOT DETECTED NOT DETECTED Final   Parainfluenza Virus 3 NOT DETECTED NOT DETECTED Final   Parainfluenza Virus 4 NOT DETECTED NOT DETECTED Final   Respiratory Syncytial Virus NOT DETECTED NOT DETECTED Final   Bordetella pertussis NOT DETECTED NOT DETECTED Final   Chlamydophila pneumoniae NOT DETECTED NOT DETECTED Final   Mycoplasma pneumoniae NOT DETECTED NOT DETECTED Final    Comment: Performed at [facility], [place] [address] [place], [place] [address]  MRSA PCR Screening     Status: None   Collection Time: 09/26/19 11:17 AM   Specimen: Nasal Mucosa; Nasopharyngeal  Result Value Ref Range Status   MRSA by PCR NEGATIVE NEGATIVE Final     Comment:        The GeneXpert MRSA Assay (FDA approved for NASAL specimens only), is one component of a comprehensive MRSA colonization surveillance program. It is not intended to diagnose MRSA infection nor to guide or monitor treatment for MRSA infections. Performed at [facility], [place] [address], [place], [place] [address]   Culture, respiratory (non-expectorated)     Status: None   Collection Time: 09/27/19 12:40 PM   Specimen: Bronchoalveolar Lavage; Respiratory  Result Value Ref Range Status   Specimen Description   Final    BRONCHIAL ALVEOLAR LAVAGE Performed at [place] [address], [place], [place] [address]    Special Requests   Final    Immunocompromised Performed at [facility], [place] [address], [place], [place] [address]    Gram Stain   Final    FEW WBC PRESENT,BOTH PMN AND MONONUCLEAR RARE GRAM NEGATIVE RODS    Culture   Final    Consistent with normal respiratory flora. Performed at [facility], [place] [address], [place],  [address]    Report Status 09/30/2019 FINAL  Final  Acid Fast Smear (AFB)     Status: None   Collection Time: 09/27/19 12:41 PM   Specimen: Bronchoalveolar Lavage; Respiratory  Result Value Ref Range Status   AFB Specimen Processing Concentration  Final   Acid Fast Smear Negative  Final    Comment: (NOTE) Performed At: [facility] [place], [place] [id number] Rush Farmer MD BZ:[id number]      Scheduled Meds: . amLODipine  10 mg Per Tube Daily  . aspirin  81 mg Per Tube Daily  . calcitRIOL  0.25 mcg Per Tube Daily  . carvedilol  12.5 mg Per Tube BID WC  . Chlorhexidine Gluconate Cloth  6 each Topical Daily  . cinacalcet  60 mg Oral Daily  . dolutegravir  50 mg Oral Daily  . free water  200 mL Per Tube Q6H  . heparin injection (subcutaneous)  5,000 Units Subcutaneous Q8H  . insulin aspart  0-9 Units Subcutaneous Q4H  . insulin  glargine  10 Units Subcutaneous QHS  . lamiVUDine  100 mg Per Tube Daily  . magnesium oxide  400 mg Per Tube Daily  . methylPREDNISolone (SOLU-MEDROL) injection  30 mg Intravenous Daily  . Mycophenolate 74m (1221mml) =  9m  750 mg Per Tube BID  . pantoprazole sodium  40 mg Per Tube Daily  . QUEtiapine  12.5 mg Per Tube QHS  . rosuvastatin  20 mg Per Tube Daily  . sodium bicarbonate  650 mg Per Tube BID  . tacrolimus  2 mg Oral Daily  . tacrolimus  3 mg Oral QHS   Continuous Infusions: . ceFEPime (MAXIPIME) IV 2 g (09/29/19 2151)  . feeding supplement (GLUCERNA 1.2 CAL) 30 mL/hr at 09/29/19 2100     LOS: 4 days   DPhillips Climes MD [facility] Office  [phone] - Text Page per Amion  If 7PM-7AM, please contact night-coverage per Amion 09/30/2019, 5:33 PM

## 2019-09-30 NOTE — Progress Notes (Signed)
NAME:  CLEMA SKOUSEN, MRN:  [id number], DOB:  Jun 30, 1946, LOS: 4 ADMISSION DATE:  09/18/2019, CONSULTATION DATE:  10/23 REFERRING MD:  Sloan Leiter, CHIEF COMPLAINT:  Dyspnea   Brief History   73 y/o female admitted on 10/22 to [facility] for lethargy and pulmonary new pulmonary infiltrates that developed one week after she was treated for COVID pneumonia.  Past Medical History  DM2 PCP pneumonia 1998 HTN HIV Hep C GERD ESRD s/p renal transplant CHF > LV normal, moderate concentric LVH, LVEF 60-65%, left atrium moderately dilated  Significant Hospital Events   10/22 admission 10/24 moved to ICU for declining mental status, intubated, bronchoscopy performed 10/25 extubated  Consults:  Pulmonary   Procedures:  10/24 ETT > 10/25  Significant Diagnostic Tests:  10/23 CT chest images personally reviewed> patchy and nodular consolidation bilaterally predominantly in upper lobes and periphery but upper lobe predeominant bronchovascular distribution some patchy ggo in bases with pleural effusions bilaterally, ? Cavitary lesion RLL 10/22 CT head > NAICP 10/24 MRI Brain > no acute findings, motion artifact 10/25 EEG > suggestive of cortical dysfunction in posterior head region, non-specific etiology, moderate to severe diffuse encephalopathy, no seizures  Micro Data:  10/23 respiratory viral panel > negative 10/24 BAL bacterial > rare GNR on stain 10/24 BAL fungal >  10/24 BAL AFB >  10/24 BAL aspergillus antigen >  10/24 BAL PCP >    Antimicrobials:  10/22 vanc > 10/25 10/22 cefepime >   Interim history/subjective:   Found altered in bed foaming at the mouth and unresponsive  Objective   Blood pressure (!) 120/91, pulse 72, temperature 98 F (36.7 C), temperature source Axillary, resp. rate 18, height _0  (1.6 m), weight 65.4 kg, SpO2 98 %.  Intake/Output Summary (Last 24 hours) at 09/30/2019 1716 Last data filed at 09/29/2019 2200 Gross per 24 hour  Intake 157.31 ml   Output 450 ml  Net -292.69 ml   Filed Weights   09/17/2019 1421 09/26/19 0520 09/30/19 0500  Weight: 65.8 kg 64.2 kg 65.4 kg   Examination:  General:  Unresponsive in bed, not following commands, acute on chronically ill appearing female. HENT: [place]/AT, PERRL, EOM-I and MMM PULM: Coarse BS diffusely CV: RRR, Nl S1/S2 and -M/R/G GI: Soft, NT, ND and +BS MSK: normal bulk and tone Neuro: Unresponsive, not following any commands  10/24 BAL fungal still pending  I reviewed CXR myself, ETT is in a good position, central line useable and infiltrate noted  Resolved Hospital Problem list     Assessment & Plan:  Acute encephalopathy, improved  most likely toxic metabolic, MRI brain negative, with myoclonus Neurology input appreciated Continue thiamine Minimize sedating medicines Use PRN pushes only, no drips ?if neurology will re-evaluate, will defer to primary Frequent orientation  Bilateral infiltrates in immunocompromised host who has recovered from COVID: unclear etiology, lymphocytic predominant, unlikely to represent sarcoidosis or hypersensitivity pneumonitis; B cell lymphoma still a concern but rapidity of time course too quick, is this a post COVID inflammatory infiltrate? Case report notes high lymph count in COVID [email or web address] Reintubate emergently Check ABG and adjust vent accordingly Keep on full support for now CXR and ABG in AM Follow up BAL cytology Will treat early if evidence of aspiration pneumonia  Renal transplant/CKD Continue tacrolimus and cellcept Monitor BMET and UOP Replace electrolytes as needed IVF given given low CVP post central line placement  Hypotension: ?medication related Levophed and IVF for now Follow CVP Stress dose steroids Anticipate will be off pressors  with IVF and allowing medications to wear off  Need for sedation: resolved Stop sedation protocol  PCCM will continue to follow   Best practice:  Diet: NPO after midnight Pain/Anxiety/Delirium protocol (if indicated): n/a VAP protocol (if indicated): n/a DVT prophylaxis: sub q heparin GI prophylaxis: n/a Glucose control: per TRH Mobility: bed rest Code Status: full Family Communication: will clarify with TRH who will call family today Disposition: move to PCU  Labs   CBC: Recent Labs  Lab 09/11/2019 1634 09/26/19 0827 09/27/19 0334 09/27/19 1450 09/28/19 0420 09/28/19 0500 09/29/19 0855 09/30/19 0415  WBC 6.5 6.6 10.2  --   --  12.4* 7.4 8.6  NEUTROABS 5.4  --   --   --   --  10.9* 6.1 7.4  HGB 9.9* 10.4* 8.2* 7.5* 7.8* 8.7* 7.5* 7.1*  HCT 31.6* 32.8* 25.8* 22.0* 23.0* 27.3* 23.5* 22.6*  MCV 92.1 89.1 87.8  --   --  89.8 88.7 90.4  PLT 162 147* 136*  --   --  130* 83* 85*    Basic Metabolic Panel: Recent Labs  Lab 09/27/19 0334 09/27/19 1447 09/27/19 1450 09/28/19 0420 09/28/19 0500 09/29/19 0855 09/30/19 0415  NA 151* 152* 153* 150* 146* 144 142  K 4.2 3.7 3.6 3.3* 3.7 3.3* 4.3  CL 123* 123*  --   --  116* 116* 116*  CO2 16* 21*  --   --  21* 21* 17*  GLUCOSE 271* 197*  --   --  167* 120* 224*  BUN 46* 44*  --   --  42* 42* 41*  CREATININE 2.64* 2.92*  --   --  2.88* 2.87* 3.04*  CALCIUM 9.5 9.5  --   --  9.7 8.9 8.6*  MG  --  2.0  --   --  2.0 1.8  --    GFR: Estimated Creatinine Clearance: 15.7 mL/min (A) (by C-G formula based on SCr of 3.04 mg/dL (H)). Recent Labs  Lab 09/09/2019 1634  09/27/19 0334 09/27/19 0500 09/28/19 0500 09/29/19 0855 09/30/19 0415  PROCALCITON  --    < >  --  0.18 0.20 0.32 0.18  WBC 6.5   < > 10.2  --  12.4* 7.4 8.6  LATICACIDVEN 1.9  --   --   --   --   --   --    < > = values in this interval not displayed.    Liver Function Tests: Recent Labs  Lab 09/26/19 0827 09/27/19 0334 09/28/19 0500 09/29/19 0855 09/30/19 0415  AST 14* 15 21 12* 13*  ALT _0 ALKPHOS 59 46 69 52 53  BILITOT 1.2 0.7 0.5 0.2* 0.6  PROT 7.1 5.8* 6.5 5.3*  5.2*  ALBUMIN 2.8* 2.3* 2.5* 2.1* 2.1*   No results for input(s): LIPASE, AMYLASE in the last 168 hours. Recent Labs  Lab 09/27/19 0334 09/29/19 0855  AMMONIA 21 22    ABG    Component Value Date/Time   PHART 7.391 09/28/2019 0420   PCO2ART 34.1 09/28/2019 0420   PO2ART 82.0 (L) 09/28/2019 0420   HCO3 20.9 09/28/2019 0420   TCO2 22 09/28/2019 0420   ACIDBASEDEF 4.0 (H) 09/28/2019 0420   O2SAT 96.0 09/28/2019 0420     Coagulation Profile: Recent Labs  Lab 09/29/19 0855 09/30/19 0415  INR 1.1 1.2    Cardiac Enzymes: No results for input(s): CKTOTAL, CKMB, CKMBINDEX, TROPONINI in the last 168 hours.  HbA1C: Hgb A1c MFr Bld  Date/Time  Value Ref Range Status  09/27/2019 03:34 AM 7.7 (H) 4.8 - 5.6 % Final    Comment:    (NOTE) Pre diabetes:          5.7%-6.4% Diabetes:              >6.4% Glycemic control for   <7.0% adults with diabetes   06/17/2018 07:55 PM 6.9 (H) 4.8 - 5.6 % Final    Comment:    (NOTE) Pre diabetes:          5.7%-6.4% Diabetes:              >6.4% Glycemic control for   <7.0% adults with diabetes     CBG: Recent Labs  Lab 09/29/19 2352 09/30/19 0428 09/30/19 0730 09/30/19 1134 09/30/19 1534  GLUCAP 234* 211* 257* 349* 343*   The patient is critically ill with multiple organ systems failure and requires high complexity decision making for assessment and support, frequent evaluation and titration of therapies, application of advanced monitoring technologies and extensive interpretation of multiple databases.   Critical Care Time devoted to patient care services described in this note is  45  Minutes. This time reflects time of care of this signee Dr [name]. This critical care time does not reflect procedure time, or teaching time or supervisory time of PA/NP/Med student/Med Resident etc but could involve care discussion time.  Rush Farmer, M.D. [facility] Pulmonary/Critical Care Medicine.

## 2019-09-30 NOTE — Progress Notes (Signed)
ABG results reported to [place]. Respiratory rate decreased to 18.

## 2019-09-30 NOTE — Plan of Care (Signed)
  Problem: Education: Goal: Knowledge of General Education information will improve Description: Including pain rating scale, medication(s)/side effects and non-pharmacologic comfort measures 09/30/2019 0752 by Orvan Falconer, RN Outcome: Not Progressing 09/30/2019 0752 by Orvan Falconer, RN Outcome: Not Progressing   Problem: Health Behavior/Discharge Planning: Goal: Ability to manage health-related needs will improve 09/30/2019 0752 by Orvan Falconer, RN Outcome: Not Progressing 09/30/2019 0752 by Orvan Falconer, RN Outcome: Not Progressing   Problem: Clinical Measurements: Goal: Ability to maintain clinical measurements within normal limits will improve 09/30/2019 0752 by Orvan Falconer, RN Outcome: Not Progressing 09/30/2019 0752 by Orvan Falconer, RN Outcome: Not Progressing Goal: Will remain free from infection 09/30/2019 0752 by Orvan Falconer, RN Outcome: Not Progressing 09/30/2019 0752 by Orvan Falconer, RN Outcome: Not Progressing Goal: Diagnostic test results will improve 09/30/2019 0752 by Orvan Falconer, RN Outcome: Not Progressing 09/30/2019 0752 by Orvan Falconer, RN Outcome: Not Progressing Goal: Respiratory complications will improve 09/30/2019 0752 by Orvan Falconer, RN Outcome: Not Progressing 09/30/2019 0752 by Orvan Falconer, RN Outcome: Not Progressing Goal: Cardiovascular complication will be avoided 09/30/2019 0752 by Orvan Falconer, RN Outcome: Not Progressing 09/30/2019 0752 by Orvan Falconer, RN Outcome: Not Progressing   Problem: Activity: Goal: Risk for activity intolerance will decrease 09/30/2019 0752 by Orvan Falconer, RN Outcome: Not Progressing 09/30/2019 0752 by Orvan Falconer, RN Outcome: Not Progressing   Problem: Nutrition: Goal: Adequate nutrition will be maintained 09/30/2019 0752 by Orvan Falconer, RN Outcome: Not Progressing 09/30/2019 0752 by Orvan Falconer, RN Outcome: Not Progressing   Problem: Coping: Goal: Level of anxiety will decrease 09/30/2019 0752 by Orvan Falconer, RN Outcome: Not Progressing 09/30/2019 0752 by Orvan Falconer, RN Outcome: Not Progressing   Problem: Elimination: Goal: Will not experience complications related to bowel motility 09/30/2019 0752 by Orvan Falconer, RN Outcome: Not Progressing 09/30/2019 0752 by Orvan Falconer, RN Outcome: Not Progressing Goal: Will not experience complications related to urinary retention 09/30/2019 0752 by Orvan Falconer, RN Outcome: Not Progressing 09/30/2019 0752 by Orvan Falconer, RN Outcome: Not Progressing   Problem: Pain Managment: Goal: General experience of comfort will improve 09/30/2019 0752 by Orvan Falconer, RN Outcome: Not Progressing 09/30/2019 0752 by Orvan Falconer, RN Outcome: Not Progressing   Problem: Safety: Goal: Ability to remain free from injury will improve 09/30/2019 0752 by Orvan Falconer, RN Outcome: Not Progressing 09/30/2019 0752 by Orvan Falconer, RN Outcome: Not Progressing   Problem: Skin Integrity: Goal: Risk for impaired skin integrity will decrease 09/30/2019 0752 by Orvan Falconer, RN Outcome: Not Progressing 09/30/2019 0752 by Orvan Falconer, RN Outcome: Not Progressing

## 2019-09-30 NOTE — Progress Notes (Addendum)
[place] Progress Note Patient Name: [name] DOB: [birth year] MRN: [id number]   Date of Service  09/30/2019  HPI/Events of Note  Notified of maximum norepinephrine requirement H/H 7.1/22.6 no sign of active bleed. Repeat H/H pending CVP 1-2 ABG 7.45/21/293 on 24/410/100%/8 PEEP not breathing over set rate  eICU Interventions   Ordered 500 cc NS bolus  Transfuse 1 unit PRBC  Decrease vent rate to 18 and FiO2 to 50%   H/H 4.8/15.2 No evidence of overt bleed. Patient has order for head CT. Will include abdominal CT to r/o retroperitoneal bleed. Will add one more unit PRBC   Intervention Category Major Interventions: Hypotension - evaluation and management;Respiratory failure - evaluation and management  Judd Lien 09/30/2019, 9:20 PM

## 2019-09-30 NOTE — Procedures (Signed)
Arterial Catheter Insertion Procedure Note [name] [id number] 18-Feb-1946  Procedure: Insertion of Arterial Catheter  Indications: Blood pressure monitoring and Frequent blood sampling  Procedure Details Consent: Unable to obtain consent because of emergent medical necessity. Time Out: Verified patient identification, verified procedure, site/side was marked, verified correct patient position, special equipment/implants available, medications/allergies/relevent history reviewed, required imaging and test results available.  Performed  Maximum sterile technique was used including antiseptics, cap, gloves, gown, hand hygiene, mask and sheet. Skin prep: Chlorhexidine; local anesthetic administered 20 gauge catheter was inserted into right femoral artery using the Seldinger technique. ULTRASOUND GUIDANCE USED: NO Evaluation Blood flow good; BP tracing good. Complications: No apparent complications.   [name] 09/30/2019

## 2019-09-30 NOTE — Progress Notes (Signed)
Inpatient Diabetes Program Recommendations  AACE/ADA: New Consensus Statement on Inpatient Glycemic Control   Target Ranges:  Prepandial:   less than 140 mg/dL      Peak postprandial:   less than 180 mg/dL (1-2 hours)      Critically ill patients:  140 - 180 mg/dL   Results for [name], [name] (MRN [id number]) as of 09/30/2019 12:17  Ref. Range 09/29/2019 03:39 09/29/2019 12:12 09/29/2019 17:05 09/29/2019 20:21 09/29/2019 23:52 09/30/2019 04:28 09/30/2019 07:30 09/30/2019 11:34  Glucose-Capillary Latest Ref Range: 70 - 99 mg/dL 127 (H) 149 (H) 257 (H) 247 (H) 234 (H) 211 (H) 257 (H) 349 (H)   Review of Glycemic Control  Current orders for Inpatient glycemic control: Lantus 10 units QHS, Novolog 0-9 units Q4H; Solumedrol 30 mg daily, Glucerna @ 70 ml/hr  Inpatient Diabetes Program Recommendations:   Insulin-Tube Feeding: Please consider ordering Novolog 4 units Q4H for tube feeding coverage. If tube feeding is stopped or held then Novolog tube feeding coverage should also be stopped or held.  Thanks, Barnie Alderman, RN, MSN, CDE Diabetes Coordinator Inpatient Diabetes Program [phone] (Team Pager from 8am to 5pm)

## 2019-09-30 NOTE — Procedures (Signed)
Intubation Procedure Note [name] [id number] 28-Mar-1946  Procedure: Intubation Indications: Airway protection and maintenance  Procedure Details Consent: Unable to obtain consent because of emergent medical necessity. Time Out: Verified patient identification, verified procedure, site/side was marked, verified correct patient position, special equipment/implants available, medications/allergies/relevent history reviewed, required imaging and test results available.  Performed  Maximum sterile technique was used including antiseptics, cap, gloves, gown, hand hygiene, mask and sheet.  MAC    Evaluation Hemodynamic Status: Persistent hypotension treated with pressors; O2 sats: stable throughout Patient's Current Condition: stable Complications: No apparent complications Patient did tolerate procedure well. Chest X-ray ordered to verify placement.  CXR: WNL.   [name] 09/30/2019

## 2019-09-30 NOTE — Procedures (Signed)
Central Venous Catheter Insertion Procedure Note [name] [id number] 1946-04-11  Procedure: Insertion of Central Venous Catheter Indications: Assessment of intravascular volume, Drug and/or fluid administration and Frequent blood sampling  Procedure Details Consent: Unable to obtain consent because of emergent medical necessity. Time Out: Verified patient identification, verified procedure, site/side was marked, verified correct patient position, special equipment/implants available, medications/allergies/relevent history reviewed, required imaging and test results available.  Performed  Maximum sterile technique was used including antiseptics, cap, gloves, gown, hand hygiene, mask and sheet. Skin prep: Chlorhexidine; local anesthetic administered A antimicrobial bonded/coated triple lumen catheter was placed in the left subclavian vein using the Seldinger technique.  Evaluation Blood flow good Complications: No apparent complications Patient did tolerate procedure well. Chest X-ray ordered to verify placement.  CXR: normal.  [name] 09/30/2019, 5:13 PM

## 2019-09-30 NOTE — Progress Notes (Signed)
  Speech Language Pathology Treatment: Dysphagia  Patient Details Name: [name] MRN: [id number] DOB: [birth year] Today's Date: 09/30/2019 Time: [id number] SLP Time Calculation (min) (ACUTE ONLY): 14 min  Assessment / Plan / Recommendation Clinical Impression  Pt transferred out of ICU last night.  Not as participatory this am; maintained eyes closed throughout session, minimal verbalizations except occasional "mmm hmmm." Pt repositioned, offered ice chips, teaspoons of water.  She demonstrated acceptance and purposeful manipulation of POs with delay; eventual swallow response palpated; coughing notable after all trials. Not ready to begin a PO diet; NG tube remains in place.  SLP will continue to follow for readiness; will likely f/u on Thursday, 10/29.  D/W RN.   HPI HPI: 73 year old with a history of renal transplantation, chronic kidney disease stage IV, HIV on antiretrovirals, chronic hepatitis C, and DM 2 who was hospitalized at [facility] 10/9 > 10/16 for Covid gastroenteritis and pneumonitis complicated by acute kidney injury.  She was referred back to the ED 10/22 by her PCP with complaints of weakness and lethargy.  In the ED she was found to have worsening infiltrates on a CXR and was admitted to [facility]. Transferred to ICU and intubated 10/24, extubated 10/25. Has NG.       SLP Plan  Continue with current plan of care       Recommendations  Diet recommendations: NPO                Oral Care Recommendations: Oral care QID Follow up Recommendations: Other (comment)(tba) SLP Visit Diagnosis: Dysphagia, unspecified (R13.10) Plan: Continue with current plan of care       GO                [name], [name] 09/30/2019, 8:20 AM  [name], [name] Office number [phone]

## 2019-09-30 NOTE — Progress Notes (Signed)
PT Cancellation Note  Patient Details Name: [name] MRN: [id number] DOB: 12/24/1945   Cancelled Treatment:    Reason Eval/Treat Not Completed: Patient's level of consciousness.   Horald Chestnut, PT   Delford Field 09/30/2019, 4:57 PM

## 2019-09-30 NOTE — Progress Notes (Signed)
VASCULAR LAB PRELIMINARY  PRELIMINARY  PRELIMINARY  PRELIMINARY  Right upper extremity venous duplex completed.    Preliminary report:  See CV proc for preliminary results.   Gave RN report  Kendan Cornforth, RVT 09/30/2019, 3:04 PM

## 2019-10-01 ENCOUNTER — Inpatient Hospital Stay (HOSPITAL_COMMUNITY): Payer: Medicare Other

## 2019-10-01 DIAGNOSIS — Z01818 Encounter for other preprocedural examination: Secondary | ICD-10-CM | POA: Diagnosis not present

## 2019-10-01 DIAGNOSIS — I82A11 Acute embolism and thrombosis of right axillary vein: Secondary | ICD-10-CM

## 2019-10-01 DIAGNOSIS — J9601 Acute respiratory failure with hypoxia: Secondary | ICD-10-CM | POA: Diagnosis not present

## 2019-10-01 DIAGNOSIS — R401 Stupor: Secondary | ICD-10-CM | POA: Diagnosis not present

## 2019-10-01 DIAGNOSIS — G9340 Encephalopathy, unspecified: Secondary | ICD-10-CM | POA: Diagnosis not present

## 2019-10-01 DIAGNOSIS — R58 Hemorrhage, not elsewhere classified: Secondary | ICD-10-CM

## 2019-10-01 DIAGNOSIS — U071 COVID-19: Secondary | ICD-10-CM | POA: Diagnosis not present

## 2019-10-01 DIAGNOSIS — G253 Myoclonus: Secondary | ICD-10-CM

## 2019-10-01 LAB — CBC WITH DIFFERENTIAL/PLATELET
Abs Immature Granulocytes: 0.17 10*3/uL — ABNORMAL HIGH (ref 0.00–0.07)
Abs Immature Granulocytes: 0.17 10*3/uL — ABNORMAL HIGH (ref 0.00–0.07)
Basophils Absolute: 0 10*3/uL (ref 0.0–0.1)
Basophils Absolute: 0 10*3/uL (ref 0.0–0.1)
Basophils Relative: 0 %
Basophils Relative: 0 %
Eosinophils Absolute: 0 10*3/uL (ref 0.0–0.5)
Eosinophils Absolute: 0 10*3/uL (ref 0.0–0.5)
Eosinophils Relative: 0 %
Eosinophils Relative: 0 %
HCT: 25 % — ABNORMAL LOW (ref 36.0–46.0)
HCT: 24.9 % — ABNORMAL LOW (ref 36.0–46.0)
Hemoglobin: 8.2 g/dL — ABNORMAL LOW (ref 12.0–15.0)
Hemoglobin: 8.3 g/dL — ABNORMAL LOW (ref 12.0–15.0)
Immature Granulocytes: 1 %
Immature Granulocytes: 1 %
Lymphocytes Relative: 5 %
Lymphocytes Relative: 6 %
Lymphs Abs: 0.9 10*3/uL (ref 0.7–4.0)
Lymphs Abs: 1.2 10*3/uL (ref 0.7–4.0)
MCH: 29.9 pg (ref 26.0–34.0)
MCH: 30.4 pg (ref 26.0–34.0)
MCHC: 32.8 g/dL (ref 30.0–36.0)
MCHC: 33.3 g/dL (ref 30.0–36.0)
MCV: 91.2 fL (ref 80.0–100.0)
MCV: 91.2 fL (ref 80.0–100.0)
Monocytes Absolute: 1.1 10*3/uL — ABNORMAL HIGH (ref 0.1–1.0)
Monocytes Absolute: 2.4 10*3/uL — ABNORMAL HIGH (ref 0.1–1.0)
Monocytes Relative: 7 %
Monocytes Relative: 12 %
Neutro Abs: 14.6 10*3/uL — ABNORMAL HIGH (ref 1.7–7.7)
Neutro Abs: 15.7 10*3/uL — ABNORMAL HIGH (ref 1.7–7.7)
Neutrophils Relative %: 87 %
Neutrophils Relative %: 81 %
Platelets: 127 10*3/uL — ABNORMAL LOW (ref 150–400)
Platelets: 134 10*3/uL — ABNORMAL LOW (ref 150–400)
RBC: 2.74 MIL/uL — ABNORMAL LOW (ref 3.87–5.11)
RBC: 2.73 MIL/uL — ABNORMAL LOW (ref 3.87–5.11)
RDW: 14.6 % (ref 11.5–15.5)
RDW: 14.9 % (ref 11.5–15.5)
WBC: 16.8 10*3/uL — ABNORMAL HIGH (ref 4.0–10.5)
WBC: 19.5 10*3/uL — ABNORMAL HIGH (ref 4.0–10.5)
nRBC: 0.7 % — ABNORMAL HIGH (ref 0.0–0.2)
nRBC: 0.9 % — ABNORMAL HIGH (ref 0.0–0.2)

## 2019-10-01 LAB — GLUCOSE, CAPILLARY
Glucose-Capillary: 151 mg/dL — ABNORMAL HIGH (ref 70–99)
Glucose-Capillary: 221 mg/dL — ABNORMAL HIGH (ref 70–99)
Glucose-Capillary: 229 mg/dL — ABNORMAL HIGH (ref 70–99)
Glucose-Capillary: 269 mg/dL — ABNORMAL HIGH (ref 70–99)
Glucose-Capillary: 271 mg/dL — ABNORMAL HIGH (ref 70–99)
Glucose-Capillary: 298 mg/dL — ABNORMAL HIGH (ref 70–99)

## 2019-10-01 LAB — T3, FREE: T3, Free: 2.5 pg/mL (ref 2.0–4.4)

## 2019-10-01 LAB — COMPREHENSIVE METABOLIC PANEL
ALT: 16 U/L (ref 0–44)
ALT: 18 U/L (ref 0–44)
AST: 26 U/L (ref 15–41)
AST: 22 U/L (ref 15–41)
Albumin: 2 g/dL — ABNORMAL LOW (ref 3.5–5.0)
Albumin: 2 g/dL — ABNORMAL LOW (ref 3.5–5.0)
Alkaline Phosphatase: 58 U/L (ref 38–126)
Alkaline Phosphatase: 54 U/L (ref 38–126)
Anion gap: 11 (ref 5–15)
Anion gap: 9 (ref 5–15)
BUN: 56 mg/dL — ABNORMAL HIGH (ref 8–23)
BUN: 62 mg/dL — ABNORMAL HIGH (ref 8–23)
CO2: 17 mmol/L — ABNORMAL LOW (ref 22–32)
CO2: 17 mmol/L — ABNORMAL LOW (ref 22–32)
Calcium: 7.9 mg/dL — ABNORMAL LOW (ref 8.9–10.3)
Calcium: 8 mg/dL — ABNORMAL LOW (ref 8.9–10.3)
Chloride: 117 mmol/L — ABNORMAL HIGH (ref 98–111)
Chloride: 118 mmol/L — ABNORMAL HIGH (ref 98–111)
Creatinine, Ser: 3.85 mg/dL — ABNORMAL HIGH (ref 0.44–1.00)
Creatinine, Ser: 4.38 mg/dL — ABNORMAL HIGH (ref 0.44–1.00)
GFR calc Af Amer: 13 mL/min — ABNORMAL LOW (ref >60)
GFR calc Af Amer: 11 mL/min — ABNORMAL LOW (ref >60)
GFR calc non Af Amer: 11 mL/min — ABNORMAL LOW (ref >60)
GFR calc non Af Amer: 10 mL/min — ABNORMAL LOW (ref >60)
Glucose, Bld: 279 mg/dL — ABNORMAL HIGH (ref 70–99)
Glucose, Bld: 278 mg/dL — ABNORMAL HIGH (ref 70–99)
Potassium: 5.2 mmol/L — ABNORMAL HIGH (ref 3.5–5.1)
Potassium: 5.1 mmol/L (ref 3.5–5.1)
Sodium: 145 mmol/L (ref 135–145)
Sodium: 144 mmol/L (ref 135–145)
Total Bilirubin: 0.4 mg/dL (ref 0.3–1.2)
Total Bilirubin: 0.5 mg/dL (ref 0.3–1.2)
Total Protein: 4.9 g/dL — ABNORMAL LOW (ref 6.5–8.1)
Total Protein: 4.8 g/dL — ABNORMAL LOW (ref 6.5–8.1)

## 2019-10-01 LAB — POCT I-STAT 7, (LYTES, BLD GAS, ICA,H+H)
Acid-base deficit: 8 mmol/L — ABNORMAL HIGH (ref 0.0–2.0)
Bicarbonate: 16.1 mmol/L — ABNORMAL LOW (ref 20.0–28.0)
Calcium, Ion: 1.22 mmol/L (ref 1.15–1.40)
HCT: 25 % — ABNORMAL LOW (ref 36.0–46.0)
Hemoglobin: 8.5 g/dL — ABNORMAL LOW (ref 12.0–15.0)
O2 Saturation: 65 %
Patient temperature: 36.8
Potassium: 5.3 mmol/L — ABNORMAL HIGH (ref 3.5–5.1)
Sodium: 143 mmol/L (ref 135–145)
TCO2: 17 mmol/L — ABNORMAL LOW (ref 22–32)
pCO2 arterial: 26.4 mmHg — ABNORMAL LOW (ref 32.0–48.0)
pH, Arterial: 7.391 (ref 7.350–7.450)
pO2, Arterial: 33 mmHg — CL (ref 83.0–108.0)

## 2019-10-01 LAB — PROTIME-INR
INR: 1.1 (ref 0.8–1.2)
Prothrombin Time: 14.3 seconds (ref 11.4–15.2)

## 2019-10-01 LAB — BASIC METABOLIC PANEL
Anion gap: 7 (ref 5–15)
BUN: 53 mg/dL — ABNORMAL HIGH (ref 8–23)
CO2: 13 mmol/L — ABNORMAL LOW (ref 22–32)
Calcium: 6.2 mg/dL — CL (ref 8.9–10.3)
Chloride: 123 mmol/L — ABNORMAL HIGH (ref 98–111)
Creatinine, Ser: 3.86 mg/dL — ABNORMAL HIGH (ref 0.44–1.00)
GFR calc Af Amer: 13 mL/min — ABNORMAL LOW (ref >60)
GFR calc non Af Amer: 11 mL/min — ABNORMAL LOW (ref >60)
Glucose, Bld: 329 mg/dL — ABNORMAL HIGH (ref 70–99)
Potassium: 4 mmol/L (ref 3.5–5.1)
Sodium: 143 mmol/L (ref 135–145)

## 2019-10-01 LAB — D-DIMER, QUANTITATIVE: D-Dimer, Quant: 1.77 ug/mL-FEU — ABNORMAL HIGH (ref 0.00–0.50)

## 2019-10-01 LAB — PNEUMOCYSTIS JIROVECI SMEAR BY DFA: Pneumocystis jiroveci Ag: NEGATIVE

## 2019-10-01 LAB — CSF CELL COUNT WITH DIFFERENTIAL
RBC Count, CSF: 450 /mm3 — ABNORMAL HIGH
Tube #: 4
WBC, CSF: 0 /mm3 (ref 0–5)

## 2019-10-01 LAB — CRYPTOCOCCAL ANTIGEN, CSF: Crypto Ag: NEGATIVE

## 2019-10-01 LAB — ASPERGILLUS ANTIGEN, BAL/SERUM: Aspergillus Ag, BAL/Serum: 0.05 Index (ref 0.00–0.49)

## 2019-10-01 LAB — ALBUMIN: Albumin: 1.5 g/dL — ABNORMAL LOW (ref 3.5–5.0)

## 2019-10-01 LAB — PHOSPHORUS
Phosphorus: 4.4 mg/dL (ref 2.5–4.6)
Phosphorus: 4.4 mg/dL (ref 2.5–4.6)

## 2019-10-01 LAB — PROTEIN AND GLUCOSE, CSF
Glucose, CSF: 148 mg/dL — ABNORMAL HIGH (ref 40–70)
Total  Protein, CSF: 38 mg/dL (ref 15–45)

## 2019-10-01 LAB — C-REACTIVE PROTEIN: CRP: 1.6 mg/dL — ABNORMAL HIGH (ref <1.0)

## 2019-10-01 LAB — MAGNESIUM
Magnesium: 2.1 mg/dL (ref 1.7–2.4)
Magnesium: 2.1 mg/dL (ref 1.7–2.4)

## 2019-10-01 LAB — BRAIN NATRIURETIC PEPTIDE: B Natriuretic Peptide: 243.2 pg/mL — ABNORMAL HIGH (ref 0.0–100.0)

## 2019-10-01 MED ORDER — INSULIN ASPART 100 UNIT/ML ~~LOC~~ SOLN
0.0000 [IU] | SUBCUTANEOUS | Status: DC
  Administered 2019-10-01: 3 [IU] via SUBCUTANEOUS

## 2019-10-01 MED ORDER — CHLORHEXIDINE GLUCONATE 0.12% ORAL RINSE (MEDLINE KIT)
15.0000 mL | Freq: Two times a day (BID) | OROMUCOSAL | Status: DC
  Administered 2019-10-01 – 2019-10-06 (×11): 15 mL via OROMUCOSAL

## 2019-10-01 MED ORDER — LORAZEPAM 2 MG/ML IJ SOLN
1.0000 mg | Freq: Once | INTRAMUSCULAR | Status: AC
  Administered 2019-10-01: 14:00:00 1 mg via INTRAVENOUS

## 2019-10-01 MED ORDER — ORAL CARE MOUTH RINSE
15.0000 mL | OROMUCOSAL | Status: DC
  Administered 2019-10-01 – 2019-10-06 (×50): 15 mL via OROMUCOSAL

## 2019-10-01 MED ORDER — INSULIN GLARGINE 100 UNIT/ML ~~LOC~~ SOLN
15.0000 [IU] | Freq: Every day | SUBCUTANEOUS | Status: DC
  Administered 2019-10-01 – 2019-10-03 (×3): 15 [IU] via SUBCUTANEOUS
  Filled 2019-10-01 (×4): qty 0.15

## 2019-10-01 MED ORDER — SODIUM ZIRCONIUM CYCLOSILICATE 10 G PO PACK
10.0000 g | PACK | Freq: Once | ORAL | Status: AC
  Administered 2019-10-01: 10 g via ORAL
  Filled 2019-10-01: qty 1

## 2019-10-01 MED ORDER — LORAZEPAM 2 MG/ML IJ SOLN
INTRAMUSCULAR | Status: AC
  Administered 2019-10-01: 1 mg via INTRAVENOUS
  Filled 2019-10-01: qty 1

## 2019-10-01 MED ORDER — VITAL AF 1.2 CAL PO LIQD
1000.0000 mL | ORAL | Status: DC
  Administered 2019-10-01 – 2019-10-02 (×2): 1000 mL

## 2019-10-01 MED ORDER — TACROLIMUS 1 MG/ML ORAL SUSPENSION
3.0000 mg | Freq: Every day | ORAL | Status: DC
  Filled 2019-10-01: qty 3

## 2019-10-01 MED ORDER — INSULIN ASPART 100 UNIT/ML ~~LOC~~ SOLN: 0.0000 [IU] | Freq: Three times a day (TID) | SUBCUTANEOUS | Status: DC

## 2019-10-01 MED ORDER — PIPERACILLIN-TAZOBACTAM IN DEX 2-0.25 GM/50ML IV SOLN
2.2500 g | Freq: Four times a day (QID) | INTRAVENOUS | Status: DC
  Administered 2019-10-01 – 2019-10-04 (×11): 2.25 g via INTRAVENOUS
  Filled 2019-10-01 (×14): qty 50

## 2019-10-01 NOTE — Progress Notes (Signed)
S/w dtr ~1000 & 1700; updated - appreciative for all we are doing.  Requested MD call for update as well. Down for CTH and CTAbd; seen by Neuro, LP done; NG swapped for CoreTrak.   Tolerating CPAP setting on Vent.  Titrated down on pressors slowly.

## 2019-10-01 NOTE — Progress Notes (Addendum)
Pharmacy Antibiotic Note  [name] is a 73 y.o. female admitted on 09/17/2019 with AMS/SOB and PNA s/p recent admission for COVID-19 PNA. Currently on D6 of Cefepime for pneumonia. Pharmacy asked to switch cefepime to zosyn due to concerns of possible cefepime-related neurotoxicity  Patient is afebrile with wbc elevated while on steroids but trending down. PCT 0.18 yesterday, CRP 1.6. Renal function worsened with no UOP within past 24 hours and Scr increased from 3.85 to 4.38 today. CXR shows stable lung opacities.   Plan: Stop Cefepime Zosyn 2.25 g IV Q6 hrs Monitor renal function and clinical progression F/u ABX LOT  Height: _0  (160 cm) Weight: 158 lb 11.7 oz (72 kg) IBW/kg (Calculated) : 52.4  Temp (24hrs), Avg:97.5 F (36.4 C), Min:92.1 F (33.4 C), Max:100.2 F (37.9 C)  Recent Labs  Lab 09/23/2019 1634  09/28/19 0500 09/29/19 0855 09/30/19 0415 09/30/19 2048 10/01/19 0500 10/01/19 0914  WBC 6.5   < > 12.4* 7.4 8.6 18.8* 16.8* 19.5*  CREATININE 3.05*   < > 2.88* 2.87* 3.04*  --  3.85* 4.38*  LATICACIDVEN 1.9  --   --   --   --   --   --   --    < > = values in this interval not displayed.    Estimated Creatinine Clearance: 11.4 mL/min (A) (by C-G formula based on SCr of 4.38 mg/dL (H)).    Allergies  Allergen Reactions  . Omeprazole Other (See Comments)    Interferes with the absorption of rilipivirine  . Eggs Or Egg-Derived Products Nausea And Vomiting  . Influenza Vaccines Other (See Comments)    Hallucination    Cultures: 10/23 MRSA PCR: neg 10/23 RVP: neg 10/24 BAL: rare GNR, normal flora 10/24 pneumocystis smear: neg 10/24 Aspergillus Ag: neg  Richardine Service, PharmD PGY1 Pharmacy Resident 10/01/2019  1:27 PM

## 2019-10-01 NOTE — Progress Notes (Signed)
Called and updated pt's daughter on pt's status, unchanged from previous call.  Questions answered to best of ability by nurse to pt's daugther.

## 2019-10-01 NOTE — Progress Notes (Signed)
Inpatient Diabetes Program Recommendations  AACE/ADA: New Consensus Statement on Inpatient Glycemic Control   Target Ranges:  Prepandial:   less than 140 mg/dL      Peak postprandial:   less than 180 mg/dL (1-2 hours)      Critically ill patients:  140 - 180 mg/dL   Results for [name], [name] (MRN [id number]) as of 10/01/2019 09:04  Ref. Range 09/30/2019 07:30 09/30/2019 11:34 09/30/2019 15:22 09/30/2019 15:34 09/30/2019 20:14 09/30/2019 23:47 10/01/2019 03:13 10/01/2019 08:20  Glucose-Capillary Latest Ref Range: 70 - 99 mg/dL 257 (H) 349 (H) 366 (H) 343 (H) 309 (H) 298 (H) 269 (H) 229 (H)   Review of Glycemic Control  Current orders for Inpatient glycemic control: Lantus 10 units QHS, Novolog 0-9 units Q4H; Solumedrol 30 mg daily, Glucerna @ 70 ml/hr  Inpatient Diabetes Program Recommendations:   Insulin-Basal: If steroids are continued, please consider increasing Lantus to 15 units QHS.  Insulin-Tube Feeding: Please consider ordering Novolog 5 units Q4H for tube feeding coverage. If tube feeding is stopped or held then Novolog tube feeding coverage should also be stopped or held.  Thanks, Barnie Alderman, RN, MSN, CDE Diabetes Coordinator Inpatient Diabetes Program [phone] (Team Pager from 8am to 5pm)

## 2019-10-01 NOTE — Progress Notes (Signed)
Spoke with pt's sister - gave update & answered all questions.

## 2019-10-01 NOTE — Progress Notes (Signed)
Bladder Scan = 135 ml

## 2019-10-01 NOTE — Progress Notes (Signed)
ETT flushed with 26mL normal saline per CCM Q-shift.   Small thick tan secretions obtained.

## 2019-10-01 NOTE — Procedures (Signed)
Cortrak  Person Inserting Tube:  Maylon Peppers C, RD Tube Type:  Cortrak - 43 inches Tube Location:  Right nare Initial Placement:  Stomach Secured by: Bridle Technique Used to Measure Tube Placement:  Documented cm marking at nare/ corner of mouth Cortrak Secured At:  70 cm    Cortrak Tube Team Note:  Consult received to place a Cortrak feeding tube.   No x-ray is required. RN may begin using tube.   If the tube becomes dislodged please keep the tube and contact the Cortrak team at [email or web address] (password [email or web address]) for replacement.  If after hours and replacement cannot be delayed, place a NG tube and confirm placement with an abdominal x-ray.    [place], [place], [place] Pager [phone] After Hours Pager

## 2019-10-01 NOTE — Progress Notes (Signed)
NAME:  [name], MRN:  [id number], DOB:  20-Mar-1946, LOS: 5 ADMISSION DATE:  09/09/2019, CONSULTATION DATE:  10/23 REFERRING MD:  Sloan Leiter, CHIEF COMPLAINT:  Dyspnea   Brief History   73 y/o female admitted on 10/22 to [facility] for lethargy and pulmonary new pulmonary infiltrates that developed one week after she was treated for COVID pneumonia.  Past Medical History  DM2 PCP pneumonia 1998 HTN HIV Hep C GERD ESRD s/p renal transplant CHF > LV normal, moderate concentric LVH, LVEF 60-65%, left atrium moderately dilated  Significant Hospital Events   10/22 admission 10/24 moved to ICU for declining mental status, intubated, bronchoscopy performed 10/25 extubated  Consults:  Pulmonary   Procedures:  10/24 ETT > 10/25  Significant Diagnostic Tests:  10/23 CT chest images personally reviewed> patchy and nodular consolidation bilaterally predominantly in upper lobes and periphery but upper lobe predeominant bronchovascular distribution some patchy ggo in bases with pleural effusions bilaterally, ? Cavitary lesion RLL 10/22 CT head > NAICP 10/24 MRI Brain > no acute findings, motion artifact 10/25 EEG > suggestive of cortical dysfunction in posterior head region, non-specific etiology, moderate to severe diffuse encephalopathy, no seizures  Micro Data:  10/23 respiratory viral panel > negative 10/24 BAL bacterial > rare GNR on stain 10/24 BAL fungal >  10/24 BAL AFB >  10/24 BAL aspergillus antigen >  10/24 BAL PCP >    Antimicrobials:  10/22 vanc > 10/25 10/22 cefepime > 10/28 Zosyn 10/28>>>  Interim history/subjective:   Remains unresponsive overnight, no further events  Objective   Blood pressure (!) 128/35, pulse 62, temperature 98.2 F (36.8 C), resp. rate (!) 21, height _0  (1.6 m), weight 72 kg, SpO2 100 %.  Intake/Output Summary (Last 24 hours) at 10/01/2019 0744 Last data filed at 10/01/2019 0400 Gross per 24 hour  Intake 1888.33 ml  Output -  Net  1888.33 ml   Filed Weights   09/26/19 0520 09/30/19 0500 10/01/19 0500  Weight: 64.2 kg 65.4 kg 72 kg   Examination:  General:  Acute on chronically ill appearing female, in bed, unresponsive HENT: [place]/AT, PERRL, EOM-spontaneous and MMM, ETT in place PULM: Coarse BS diffusely CV: RRR, Nl S1/S2 and -M/R/G GI: Soft, NT, ND and +BS MSK: normal bulk and tone Neuro: Poorly responsive, not following commands  10/24 BAL fungal still pending but aspergillus ag is 0.05, PCP pending  I reviewed CXR myself, ETT is in a good position, central line useable and infiltrate noted  Resolved Hospital Problem list     Assessment & Plan:  Acute encephalopathy, improved  most likely toxic metabolic, MRI brain negative, with myoclonus Neurology input appreciated Continue thiamine Minimize sedating medicines Use PRN pushes only, no drips ?if neurology will re-evaluate, will defer to primary Frequent orientation D/C Cefepime and start zosyn for neurotoxicity risk  Bilateral infiltrates in immunocompromised host who has recovered from COVID: unclear etiology, lymphocytic predominant, unlikely to represent sarcoidosis or hypersensitivity pneumonitis; suspect a post covid inflammatory response or a fungal element Reintubated emergently PaO2 on ABG is  Keep on full support for now CXR and ABG in AM Follow up BAL cytology Will treat early if evidence of aspiration pneumonia ? If galactomannan level will be helpful in an immune compromised patient, ID curbsided, they will evaluate and call us back, appreciate input  Renal transplant/CKD Continue tacrolimus and cellcept Monitor BMET and UOP Replace electrolytes as needed IVF given given low CVP post central line placement  Hypotension: ?medication related Levophed and  IVF for now Follow CVP Stress dose steroids Will hold off diureses given pressor demand  Need for sedation: resolved Stop sedation protocol  PCCM will continue to follow   Best practice:  Diet: NPO after midnight Pain/Anxiety/Delirium protocol (if indicated): n/a VAP protocol (if indicated): n/a DVT prophylaxis: sub q heparin GI prophylaxis: n/a Glucose control: per TRH Mobility: bed rest Code Status: full Family Communication: will clarify with TRH who will call family today Disposition: move to PCU  Labs   CBC: Recent Labs  Lab 10/03/2019 1634  09/27/19 0334  09/28/19 0500 09/29/19 0855 09/30/19 0415 09/30/19 2048 10/01/19 0551  WBC 6.5   < > 10.2  --  12.4* 7.4 8.6 18.8*  --   NEUTROABS 5.4  --   --   --  10.9* 6.1 7.4  --   --   HGB 9.9*   < > 8.2*   < > 8.7* 7.5* 7.1* 4.8* 8.5*  HCT 31.6*   < > 25.8*   < > 27.3* 23.5* 22.6* 15.2* 25.0*  MCV 92.1   < > 87.8  --  89.8 88.7 90.4 91.0  --   PLT 162   < > 136*  --  130* 83* 85* 110*  108*  --    < > = values in this interval not displayed.    Basic Metabolic Panel: Recent Labs  Lab 09/27/19 0334 09/27/19 1447  09/28/19 0420 09/28/19 0500 09/29/19 0855 09/30/19 0415 10/01/19 0551  NA 151* 152*   < > 150* 146* 144 142 143  K 4.2 3.7   < > 3.3* 3.7 3.3* 4.3 5.3*  CL 123* 123*  --   --  116* 116* 116*  --   CO2 16* 21*  --   --  21* 21* 17*  --   GLUCOSE 271* 197*  --   --  167* 120* 224*  --   BUN 46* 44*  --   --  42* 42* 41*  --   CREATININE 2.64* 2.92*  --   --  2.88* 2.87* 3.04*  --   CALCIUM 9.5 9.5  --   --  9.7 8.9 8.6*  --   MG  --  2.0  --   --  2.0 1.8  --   --    < > = values in this interval not displayed.   GFR: Estimated Creatinine Clearance: 16.4 mL/min (A) (by C-G formula based on SCr of 3.04 mg/dL (H)). Recent Labs  Lab 09/15/2019 1634  09/27/19 0500 09/28/19 0500 09/29/19 0855 09/30/19 0415 09/30/19 2048  PROCALCITON  --    < > 0.18 0.20 0.32 0.18  --   WBC 6.5   < >  --  12.4* 7.4 8.6 18.8*  LATICACIDVEN 1.9  --   --   --   --   --   --    < > = values in this interval not displayed.    Liver Function Tests: Recent Labs  Lab 09/26/19 0827 09/27/19  0334 09/28/19 0500 09/29/19 0855 09/30/19 0415  AST 14* 15 21 12* 13*  ALT _0 ALKPHOS 59 46 69 52 53  BILITOT 1.2 0.7 0.5 0.2* 0.6  PROT 7.1 5.8* 6.5 5.3* 5.2*  ALBUMIN 2.8* 2.3* 2.5* 2.1* 2.1*   No results for input(s): LIPASE, AMYLASE in the last 168 hours. Recent Labs  Lab 09/27/19 0334 09/29/19 0855  AMMONIA 21 22    ABG    Component  Value Date/Time   PHART 7.391 10/01/2019 0551   PCO2ART 26.4 (L) 10/01/2019 0551   PO2ART 33.0 (LL) 10/01/2019 0551   HCO3 16.1 (L) 10/01/2019 0551   TCO2 17 (L) 10/01/2019 0551   ACIDBASEDEF 8.0 (H) 10/01/2019 0551   O2SAT 65.0 10/01/2019 0551     Coagulation Profile: Recent Labs  Lab 09/29/19 0855 09/30/19 0415 09/30/19 2048  INR 1.1 1.2 1.3*    Cardiac Enzymes: No results for input(s): CKTOTAL, CKMB, CKMBINDEX, TROPONINI in the last 168 hours.  HbA1C: Hgb A1c MFr Bld  Date/Time Value Ref Range Status  09/27/2019 03:34 AM 7.7 (H) 4.8 - 5.6 % Final    Comment:    (NOTE) Pre diabetes:          5.7%-6.4% Diabetes:              >6.4% Glycemic control for   <7.0% adults with diabetes   06/17/2018 07:55 PM 6.9 (H) 4.8 - 5.6 % Final    Comment:    (NOTE) Pre diabetes:          5.7%-6.4% Diabetes:              >6.4% Glycemic control for   <7.0% adults with diabetes     CBG: Recent Labs  Lab 09/30/19 1522 09/30/19 1534 09/30/19 2014 09/30/19 2347 10/01/19 0313  GLUCAP 366* 343* 309* 298* 269*   The patient is critically ill with multiple organ systems failure and requires high complexity decision making for assessment and support, frequent evaluation and titration of therapies, application of advanced monitoring technologies and extensive interpretation of multiple databases.   Critical Care Time devoted to patient care services described in this note is  36  Minutes. This time reflects time of care of this signee Dr [name]. This critical care time does not reflect procedure time, or teaching  time or supervisory time of PA/NP/Med student/Med Resident etc but could involve care discussion time.  Rush Farmer, M.D. [facility] Pulmonary/Critical Care Medicine.

## 2019-10-01 NOTE — Progress Notes (Signed)
PT Cancellation Note  Patient Details Name: AYDIN SEFTON MRN: [id number] DOB: 03/28/1946   Cancelled Treatment:    Reason Eval/Treat Not Completed: Medical issues which prohibited therapy, remains on ventilator.   Claretha Cooper 10/01/2019, 6:58 AM [place]  Office [phone]

## 2019-10-01 NOTE — Progress Notes (Signed)
Rt Note:  ABG results mixed venous.  7.39-26/33/16 65% RT attempted x2. Unable to obtain another sample.  Right arm limited extremity. 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

## 2019-10-01 NOTE — Procedures (Signed)
Indication: Altered Mental Status  Risks of the procedure were dicussed with the patient including post-LP headache, bleeding, infection, weakness/numbness of legs(radiculopathy).  The patient's daughter agreed and phone consent was obtained.   The patient was prepped and draped, and using sterile technique a 20 gauge quinke spinal needle was inserted in the L4-5 space. After multiple attempts, no CSF was obtained and therefore the L5-S1 space was used, again with no success. Finally, the L3-4 space was attempted and after several attempts, blood tinged CSF was obtained that did rapidly clear. The tubes were collected in the order 4-1-2-3 The opening pressure was not performed. Approximately 6 cc of CSF were obtained and sent for analysis.   Roland Rack, MD [facility] [phone]  If 7pm- 7am, please page neurology on call as listed in [place].

## 2019-10-01 NOTE — Progress Notes (Signed)
RT Note:  ABG results from early in shift did not cross over in Epic.  Results below: 7.43-21-293-15.2

## 2019-10-01 NOTE — Progress Notes (Addendum)
Subjective: Was improving some and worked with PT two days ago. Yesterday, after transfer from ICU, she had worsening encephalopathy and myoclonus.   Exam: Vitals:   10/01/19 1130 10/01/19 1135  BP:  (!) 161/23  Pulse: 66 63  Resp: (!) 23 (!) 23  Temp: 99.7 F (37.6 C) 99.9 F (37.7 C)  SpO2: 100% 100%   Gen: In bed, NAD Resp: non-labored breathing, no acute distress Abd: soft, nt  Neuro: MS: opens eyes and regards examiner, but does not follow commands.  CN: PERRL, does not blink to threat but does fixate, corneals intact Motor: moves all extremities spontaneously with spontaneous and action induced myoclonus.  Sensory:responds to nox stim x 4.  DTR:no hyperreflexia or clonus  Pertinent Labs: Cr 4.38  Impression: 73 yo F with AMS in the setting of post-covid pulmonary infiltrates. Given her immune suppression, unclear cause of encephalopathy, I do think that LP would be indicated. Cefepime neurotoxicity can cause myoclonus and encephalopathy, and I would favor avoiding this. Tacrolimus has had encephalopath explained. The movements that I see do not look like ictal activity and EEG was negative, so I think seizure is less likely.   Recommendations: 1) LP for cells, glucose, protein, cultures, crypto  2) Discontinue cefepime.  3) tacrolimus level in the am 4) pharmacy consult for meds that cause myoclonus.  5) will follow.   This patient is critically ill and at significant risk of neurological worsening, death and care requires constant monitoring of vital signs, hemodynamics,respiratory and cardiac monitoring, neurological assessment, discussion with family, other specialists and medical decision making of high complexity. I spent 45 minutes of neurocritical care time  in the care of  this patient. This was time spent independent of any time provided by nurse practitioner or PA.  Roland Rack, MD [facility] [phone]  If 7pm- 7am, please page  neurology on call as listed in [place]. 10/01/2019  2:45 PM [facility].

## 2019-10-01 NOTE — Progress Notes (Addendum)
Nutrition Follow-up  RD working remotely.  DOCUMENTATION CODES:   Severe malnutrition in context of chronic illness  INTERVENTION:   Change tube feeding to better meet re-estimated needs:    Vital AF 1.2 at 60 ml/h (1440 ml per day)   Provides 1728 kcal, 108 gm protein, 1168 ml free water daily  NUTRITION DIAGNOSIS:   Severe Malnutrition related to chronic illness(HIV, CKD, CHF) as evidenced by severe muscle depletion, severe fat depletion, energy intake < or equal to 75% for > or equal to 1 month.  Ongoing   GOAL:   Patient will meet greater than or equal to 90% of their needs  Being addressed with TF   MONITOR:   TF tolerance, Labs, Weight trends  ASSESSMENT:   73 y/o female admitted on 10/22 to [facility] for lethargy and new pulmonary infiltrates that developed one week after she was treated for COVID pneumonia. PMH includes HIV, ESRD/HD S/P renal transplant (now with CKD-IV), DM-2, CHF, Hepatitis C, HTN, HLD.   Extubated 10/25. NGT placed by RN 10/25, and TF was initiated. Re-intubated 10/27. Requiring one pressor.  TF is infusing via NG tube. Cortrak to be placed today.   Patient is currently intubated on ventilator support MV: 9.1 L/min Temp (24hrs), Avg:97.5 F (36.4 C), Min:92.1 F (33.4 C), Max:100.2 F (37.9 C)   Labs reviewed. Potassium 5.1, BUN 62 (H), creatinine 4.38 (H) CBG's: [phone]  Medications reviewed and include Calcitriol, Sensipar, novolog, lantus, solumedrol, Mag-ox, levophed.   I/O +5.1 L since admission. Weight trending up. +1 mild pitting edema to BLE, +2 moderate pitting edema to RUE per RN documentation today.   Nutrition Focused Physical Exam:  Subcutaneous Fat   Orbital Region Severe depletion -  Upper Arm Region Unable to assess -  Thoracic and Lumbar Region Unable to assess -  Buccal Region Unable to assess -  Muscle   Temple Region Severe depletion -  Clavicle Bone Region Severe depletion -  Clavicle and Acromion  Bone Region Severe depletion -  Scapular Bone Region Severe depletion -  Dorsal Hand Severe depletion -  Patellar Region Severe depletion -  Anterior Thigh Region Severe depletion -  Posterior Calf Region Severe depletion     Diet Order:   Diet Order            Diet NPO time specified  Diet effective now              EDUCATION NEEDS:   Not appropriate for education at this time  Skin:  Skin Assessment: Reviewed RN Assessment  Last BM:  10/27  Height:   Ht Readings from Last 1 Encounters:  09/26/19 5\' 3"  (1.6 m)    Weight:   Wt Readings from Last 1 Encounters:  10/01/19 72 kg  09/26/19 64.2 kg (BMI=25)  Ideal Body Weight:  52.3 kg  BMI:  Body mass index is 28.12 kg/m.  Estimated Nutritional Needs:   Kcal:  [id number]  Protein:  90-110 gm  Fluid:  2L/day    Molli Barrows, RD, LDN, [place] Pager [phone] After Hours Pager [phone]

## 2019-10-01 NOTE — Progress Notes (Signed)
PROGRESS NOTE    Susan SZYMBORSKI  DVV:[id number] DOB: [birth year] DOA: 09/18/2019 PCP: Earlene Plater, MD   Brief Narrative:  73 year old BF PMHx  renal transplantation, chronic kidney disease stage IV, HIV on antiretrovirals, chronic hepatitis C, and DM 2   who was hospitalized at [facility] 10/9 > 10/16 for Covid gastroenteritis and pneumonitis complicated by acute kidney injury.  She was referred back to the ED 10/22 by her PCP with complaints of weakness and lethargy.  In the ED she was found to have worsening infiltrates on a CXR and was admitted to [facility].    Subjective: Intubated/sedated   Assessment & Plan:   Active Problems:   Encounter for intubation   COVID-19   Pneumonia due to COVID-19 virus   Altered mental status   Acute respiratory failure with hypoxemia (HCC)   Acute encephalopathy   Shock circulatory (HCC)   Pneumonitis/bilateral pulmonary infiltrates - Etiology unclear -lymphocytic predominant as per BAL specimen - PCCM following - completed course of remdesivir during her last hospitalization  -Was reintubated emergently, back on the vent, vent management and sedation per PCCM -Follow on BAL cytology COVID-19 Labs  Recent Labs    09/30/19 2048  DDIMER 1.87*    Lab Results  Component Value Date   [place] (A) 09/12/2019   [place] Not Detected 62/69/4854   Acute metabolic encephalopathy -MRI unrevealing -EEG without acute helpful findings -10/28 neurology completed LP will awaiting findings -62/70 acute metabolic encephalopathy -35/00 repeat head CT pending -Neurology evaluated patient today awaiting recommendations  Myoclonic jerks -See acute encephalopathy   Acute RIGHT upper extremity DVT -10/28 pharmacy restarted anticoagulation at prophylactic dose following LP.  Patient had significant drop in H/H on 10/27, will address whether to increase to therapeutic level in a.m.  Acute bleed  -Patient removed from Covid PACT trial secondary to significant drop in H/H -10/27 transfuse 1 unit PRBC -10/28 CT head negative for acute bleed -10/28 CT abdomen pelvis negative for acute bleed -Occult blood pending -Gastroccult pending -Haptoglobin/LDH pending -Anemia panel pending -HIT panel pending -DIC panel pending  Thrombocytopenia -No clear evidence of occult bleeding monitor closely  Diabetes type 2 uncontrolled with complication -93/81 hemoglobin A1c= 7.7 -10/28 increase Lantus 15 units daily -10/28 increase NovoLog moderate   S/p renal transplant -Pharmacy managing CellCept and tacrolimus dosing. -10/29 tacrolimus dose pending  Acute on CKD stage III/IV (baseline Cr 2.89-3.56) Recent Labs  Lab 09/28/19 0500 09/29/19 0855 09/30/19 0415 10/01/19 0500 10/01/19 0914  CREATININE 2.88* 2.87* 3.04* 3.85* 4.38*  -Patient with worsening renal function will consult nephrology in the a.m. most likely will require CRRT  Hypernatremia -Resolved  Hypokalemia -Resolved; on the high side currently   Low TSH  -Free T4 is mildly elevated, but at this point I would be very hesitant to start her on any methimazole  HIV -Continue usual outpatient medical therapy   HTN Blood pressure currently controlled  HLD  GERD  Deconditioning/debility PT/OT consultations    DVT prophylaxis: Subcu heparin Code Status:  Family Communication:  Disposition Plan: TBD   Consultants:  PCCM  Procedures/Significant Events:  10/9 > 10/16 admit to [facility] w/ COVID gastroenteritis and PNA 10/22 re-admit to [facility] via [facility] ED 10/24 transfer to ICU with altered mental status -intubated 10/24 MRI brain -no acute findings 10/25 extubated 10/25 EEG -posterior cortical dysfunction of nonspecific etiology with evidence of moderate to severe diffuse encephalopathy 10/27 right upper extremity venous Doppler significant for DVT. 10/27 transferred back  to ICU secondary to  respiratory distress and encephalopathy required intubation    I have personally reviewed and interpreted all radiology studies and my findings are as above.  VENTILATOR SETTINGS: Vent mode; PRVC Rate set; 18 Vt Set; 410 PEEP; 5  Pressure support; 8 FiO2; 40%    Cultures   Antimicrobials: Anti-infectives (From admission, onward)   Start     Stop   10/01/19 2100  piperacillin-tazobactam (ZOSYN) IVPB 2.25 g         09/28/19 1000  lamiVUDine (EPIVIR) 10 MG/ML solution 100 mg         09/28/19 0600  vancomycin (VANCOCIN) IVPB 1000 mg/200 mL premix  Status:  Discontinued     09/28/19 1245   09/26/19 1000  dolutegravir (TIVICAY) tablet 50 mg         09/26/19 1000  lamiVUDine (EPIVIR) 10 MG/ML solution 100 mg  Status:  Discontinued     09/27/19 1431   09/26/19 0030  vancomycin (VANCOCIN) 1,250 mg in sodium chloride 0.9 % 250 mL IVPB     09/26/19 0404   09/26/19 0030  ceFEPIme (MAXIPIME) 2 g in sodium chloride 0.9 % 100 mL IVPB  Status:  Discontinued     10/01/19 1323       Devices    LINES / TUBES:      Continuous Infusions: . sodium chloride    . ceFEPime (MAXIPIME) IV Stopped (09/30/19 2206)  . feeding supplement (GLUCERNA 1.2 CAL) 30 mL/hr at 09/29/19 2100  . fentaNYL infusion INTRAVENOUS 25 mcg/hr (10/01/19 0400)  . norepinephrine (LEVOPHED) Adult infusion 40 mcg/min (10/01/19 0400)  . sodium chloride Stopped (10/01/19 0127)     Objective: Vitals:   10/01/19 0700 10/01/19 0715 10/01/19 0730 10/01/19 0745  BP: (!) 128/35  (!) 137/35   Pulse: 62 62 62 61  Resp: (!) 21 (!) 22 (!) 22 (!) 21  Temp: 98.2 F (36.8 C) 98.2 F (36.8 C) 98.6 F (37 C) 98.6 F (37 C)  TempSrc:      SpO2: 100% 100% 100% 100%  Weight:      Height:        Intake/Output Summary (Last 24 hours) at 10/01/2019 0752 Last data filed at 10/01/2019 0400 Gross per 24 hour  Intake 1888.33 ml  Output -  Net 1888.33 ml   Filed Weights   09/26/19 0520 09/30/19 0500 10/01/19 0500   Weight: 64.2 kg 65.4 kg 72 kg    Examination:  General: Intubated/sedated positive acute respiratory distress Eyes: negative scleral hemorrhage, negative anisocoria, negative icterus ENT: Negative Runny nose, negative gingival bleeding, Neck:  Negative scars, masses, torticollis, lymphadenopathy, JVD Lungs: Tachypneic clear to auscultation bilaterally without wheezes or crackles Cardiovascular: Regular rate and rhythm without murmur gallop or rub normal S1 and S2 Abdomen: negative abdominal pain, nondistended, positive soft, bowel sounds, no rebound, no ascites, no appreciable mass Extremities: No significant cyanosis, clubbing, or edema bilateral lower extremities Skin: Negative rashes, lesions, ulcers Psychiatric: Intubated/sedated Central nervous system: Intubated/sedated  .     Data Reviewed: Care during the described time interval was provided by me .  I have reviewed this patient's available data, including medical history, events of note, physical examination, and all test results as part of my evaluation.   CBC: Recent Labs  Lab 09/18/2019 1634  09/28/19 0500 09/29/19 0855 09/30/19 0415 09/30/19 2048 10/01/19 0500 10/01/19 0551  WBC 6.5   < > 12.4* 7.4 8.6 18.8* 16.8*  --   NEUTROABS 5.4  --  10.9* 6.1 7.4  --  PENDING  --   HGB 9.9*   < > 8.7* 7.5* 7.1* 4.8* 8.2* 8.5*  HCT 31.6*   < > 27.3* 23.5* 22.6* 15.2* 25.0* 25.0*  MCV 92.1   < > 89.8 88.7 90.4 91.0 91.2  --   PLT 162   < > 130* 83* 85* 110*  108* 127*  --    < > = values in this interval not displayed.   Basic Metabolic Panel: Recent Labs  Lab 09/27/19 0334 09/27/19 1447  09/28/19 0420 09/28/19 0500 09/29/19 0855 09/30/19 0415 10/01/19 0551  NA 151* 152*   < > 150* 146* 144 142 143  K 4.2 3.7   < > 3.3* 3.7 3.3* 4.3 5.3*  CL 123* 123*  --   --  116* 116* 116*  --   CO2 16* 21*  --   --  21* 21* 17*  --   GLUCOSE 271* 197*  --   --  167* 120* 224*  --   BUN 46* 44*  --   --  42* 42* 41*  --    CREATININE 2.64* 2.92*  --   --  2.88* 2.87* 3.04*  --   CALCIUM 9.5 9.5  --   --  9.7 8.9 8.6*  --   MG  --  2.0  --   --  2.0 1.8  --   --    < > = values in this interval not displayed.   GFR: Estimated Creatinine Clearance: 16.4 mL/min (A) (by C-G formula based on SCr of 3.04 mg/dL (H)). Liver Function Tests: Recent Labs  Lab 09/26/19 0827 09/27/19 0334 09/28/19 0500 09/29/19 0855 09/30/19 0415  AST 14* 15 21 12* 13*  ALT _0 ALKPHOS 59 46 69 52 53  BILITOT 1.2 0.7 0.5 0.2* 0.6  PROT 7.1 5.8* 6.5 5.3* 5.2*  ALBUMIN 2.8* 2.3* 2.5* 2.1* 2.1*   No results for input(s): LIPASE, AMYLASE in the last 168 hours. Recent Labs  Lab 09/27/19 0334 09/29/19 0855  AMMONIA 21 22   Coagulation Profile: Recent Labs  Lab 09/29/19 0855 09/30/19 0415 09/30/19 2048  INR 1.1 1.2 1.3*   Cardiac Enzymes: No results for input(s): CKTOTAL, CKMB, CKMBINDEX, TROPONINI in the last 168 hours. BNP (last 3 results) No results for input(s): PROBNP in the last 8760 hours. HbA1C: No results for input(s): HGBA1C in the last 72 hours. CBG: Recent Labs  Lab 09/30/19 1522 09/30/19 1534 09/30/19 2014 09/30/19 2347 10/01/19 0313  GLUCAP 366* 343* 309* 298* 269*   Lipid Profile: No results for input(s): CHOL, HDL, LDLCALC, TRIG, CHOLHDL, LDLDIRECT in the last 72 hours. Thyroid Function Tests: Recent Labs    09/29/19 0855 09/30/19 0415  TSH 0.130*  --   FREET4  --  1.21*  T3FREE  --  2.5   Anemia Panel: Recent Labs    09/29/19 0855  VITAMINB12 320  FOLATE 7.3  TIBC 126*  IRON 31   Urine analysis:    Component Value Date/Time   COLORURINE YELLOW 09/28/2019 0100   APPEARANCEUR CLEAR 09/28/2019 0100   LABSPEC 1.015 09/28/2019 0100   PHURINE 5.0 09/28/2019 0100   GLUCOSEU >=500 (A) 09/28/2019 0100   HGBUR SMALL (A) 09/28/2019 0100   BILIRUBINUR NEGATIVE 09/28/2019 0100   KETONESUR 5 (A) 09/28/2019 0100   PROTEINUR >=300 (A) 09/28/2019 0100   UROBILINOGEN 0.2  03/02/2014 1441   NITRITE NEGATIVE 09/28/2019 0100   LEUKOCYTESUR NEGATIVE  09/28/2019 0100   Sepsis Labs: _0 (procalcitonin:4,lacticidven:4)  ) Recent Results (from the past 240 hour(s))  Respiratory Panel by PCR     Status: None   Collection Time: 09/26/19  7:56 AM   Specimen: Nasopharyngeal Swab; Respiratory  Result Value Ref Range Status   Adenovirus NOT DETECTED NOT DETECTED Final   Coronavirus 229E NOT DETECTED NOT DETECTED Final    Comment: (NOTE) The Coronavirus on the Respiratory Panel, DOES NOT test for the novel  Coronavirus (2019 nCoV)    Coronavirus HKU1 NOT DETECTED NOT DETECTED Final   Coronavirus NL63 NOT DETECTED NOT DETECTED Final   Coronavirus OC43 NOT DETECTED NOT DETECTED Final   Metapneumovirus NOT DETECTED NOT DETECTED Final   Rhinovirus / Enterovirus NOT DETECTED NOT DETECTED Final   Influenza A NOT DETECTED NOT DETECTED Final   Influenza B NOT DETECTED NOT DETECTED Final   Parainfluenza Virus 1 NOT DETECTED NOT DETECTED Final   Parainfluenza Virus 2 NOT DETECTED NOT DETECTED Final   Parainfluenza Virus 3 NOT DETECTED NOT DETECTED Final   Parainfluenza Virus 4 NOT DETECTED NOT DETECTED Final   Respiratory Syncytial Virus NOT DETECTED NOT DETECTED Final   Bordetella pertussis NOT DETECTED NOT DETECTED Final   Chlamydophila pneumoniae NOT DETECTED NOT DETECTED Final   Mycoplasma pneumoniae NOT DETECTED NOT DETECTED Final    Comment: Performed at [facility], [address] [place], [place] [address]  MRSA PCR Screening     Status: None   Collection Time: 09/26/19 11:17 AM   Specimen: Nasal Mucosa; Nasopharyngeal  Result Value Ref Range Status   MRSA by PCR NEGATIVE NEGATIVE Final    Comment:        The GeneXpert MRSA Assay (FDA approved for NASAL specimens only), is one component of a comprehensive MRSA colonization surveillance program. It is not intended to diagnose MRSA infection nor to guide or monitor treatment for MRSA  infections. Performed at [facility], [address], [place], [place] [address]   Culture, respiratory (non-expectorated)     Status: None   Collection Time: 09/27/19 12:40 PM   Specimen: Bronchoalveolar Lavage; Respiratory  Result Value Ref Range Status   Specimen Description   Final    BRONCHIAL ALVEOLAR LAVAGE Performed at [place] [address] [place], [place] [address]    Special Requests   Final    Immunocompromised Performed at [facility], [place] [address] [place], [place] [address]    Gram Stain   Final    FEW WBC PRESENT,BOTH PMN AND MONONUCLEAR RARE GRAM NEGATIVE RODS    Culture   Final    Consistent with normal respiratory flora. Performed at [facility], [place] [address] [place], [place] [address]    Report Status 09/30/2019 FINAL  Final  Aspergillus Ag, BAL/Serum     Status: None   Collection Time: 09/27/19 12:41 PM   Specimen: Bronchoalveolar Lavage; Respiratory  Result Value Ref Range Status   Aspergillus Ag, BAL/Serum 0.05 0.00 - 0.49 Index Final    Comment: (NOTE) Performed At: [facility] [place], [place] [id number] Rush Farmer MD ON:[id number] Performed At: [facility] [address] [place], [place] [id number] Katina Degree MDPhD DG:[id number]   Acid Fast Smear (AFB)     Status: None   Collection Time: 09/27/19 12:41 PM   Specimen: Bronchoalveolar Lavage; Respiratory  Result Value Ref Range Status   AFB Specimen Processing Concentration  Final   Acid Fast  Smear Negative  Final    Comment: (NOTE) Performed At: [facility] [place], [place] [id number] Rush Farmer MD VQ:[id number]          Radiology Studies: Dg Chest Port 1 View  Result Date: 10/01/2019 CLINICAL DATA:  Endotracheal tube placement. EXAM: PORTABLE CHEST 1 VIEW COMPARISON:  September 30, 2019. FINDINGS: Stable cardiomediastinal silhouette. Endotracheal and  nasogastric tubes are unchanged in position. Left subclavian catheter is unchanged. No pneumothorax is noted. Stable bilateral lung opacities are noted consistent with pneumonia. Bony thorax is unremarkable. IMPRESSION: Stable support apparatus. Stable bilateral lung opacities are noted consistent with multifocal pneumonia. Electronically Signed   By: Marijo Conception M.D.   On: 10/01/2019 07:46   Dg Chest Port 1 View  Result Date: 09/30/2019 CLINICAL DATA:  Coronavirus infection. Ventilator support. EXAM: PORTABLE CHEST 1 VIEW COMPARISON:  09/27/2019 FINDINGS: Endotracheal tube tip is 3 cm above the carina. Orogastric or nasogastric tube enters the stomach. Left subclavian central line tip is in the right atrium. This is newly placed. No pneumothorax. Patchy bilateral pulmonary infiltrates consistent with viral pneumonia appear similar. No worsening or new finding. IMPRESSION: 1. Newly placed left subclavian central line with tip in the right atrium. No pneumothorax. 2. No change in patchy bilateral pulmonary infiltrates consistent with viral pneumonia. Electronically Signed   By: Nelson Chimes M.D.   On: 09/30/2019 16:42   Vas [place] Upper Extremity Venous Duplex  Result Date: 09/30/2019 UPPER VENOUS STUDY  Indications: Covid positive, and Swelling Limitations: Patient confusion, positioning. Comparison Study: No prior study on file for comparison Performing Technologist: Sharion Dove RVS  Examination Guidelines: A complete evaluation includes B-mode imaging, spectral Doppler, color Doppler, and power Doppler as needed of all accessible portions of each vessel. Bilateral testing is considered an integral part of a complete examination. Limited examinations for reoccurring indications may be performed as noted.  Right Findings: +----------+------------+---------+-----------+----------+---------------------+ RIGHT     CompressiblePhasicitySpontaneousProperties       Summary         +----------+------------+---------+-----------+----------+---------------------+ IJV                                                    Not visualized     +----------+------------+---------+-----------+----------+---------------------+ Subclavian               Yes       Yes               patent by color and                                                             Doppler        +----------+------------+---------+-----------+----------+---------------------+ Axillary      Full       Yes       Yes                                    +----------+------------+---------+-----------+----------+---------------------+ Brachial      None       No        No  Acute         +----------+------------+---------+-----------+----------+---------------------+ Radial                   Yes       Yes                patent by Doppler   +----------+------------+---------+-----------+----------+---------------------+ Ulnar                                                  Not visualized     +----------+------------+---------+-----------+----------+---------------------+ Cephalic      None       No        No                       Acute         +----------+------------+---------+-----------+----------+---------------------+ Basilic                                                Not visualized     +----------+------------+---------+-----------+----------+---------------------+  Summary:  Right: Findings consistent with acute deep vein thrombosis involving the right brachial veins. Findings consistent with acute superficial vein thrombosis involving the right cephalic vein.  *See table(s) above for measurements and observations.  Diagnosing physician: Deitra Mayo MD Electronically signed by Deitra Mayo MD on 09/30/2019 at 4:57:10 PM.    Final         Scheduled Meds: . amLODipine  10 mg Per Tube Daily  . aspirin  81 mg Per Tube Daily   . calcitRIOL  0.25 mcg Per Tube Daily  . carvedilol  12.5 mg Per Tube BID WC  . chlorhexidine gluconate (MEDLINE KIT)  15 mL Mouth Rinse BID  . Chlorhexidine Gluconate Cloth  6 each Topical Daily  . cinacalcet  60 mg Oral Daily  . dolutegravir  50 mg Oral Daily  . fentaNYL (SUBLIMAZE) injection  25 mcg Intravenous Once  . free water  200 mL Per Tube Q6H  . heparin injection (subcutaneous)  5,000 Units Subcutaneous Q8H  . insulin aspart  0-9 Units Subcutaneous Q4H  . insulin glargine  10 Units Subcutaneous QHS  . lamiVUDine  100 mg Per Tube Daily  . magnesium oxide  400 mg Per Tube Daily  . mouth rinse  15 mL Mouth Rinse 10 times per day  . methylPREDNISolone (SOLU-MEDROL) injection  30 mg Intravenous Daily  . Mycophenolate 769m (129mml) = 52m95m750 mg Per Tube BID  . pantoprazole sodium  40 mg Per Tube Daily  . QUEtiapine  12.5 mg Per Tube QHS  . rosuvastatin  20 mg Per Tube Daily  . sodium bicarbonate  650 mg Per Tube BID  . sodium chloride flush  3 mL Intravenous Q12H  . tacrolimus  2 mg Oral Daily  . tacrolimus  3 mg Oral QHS   Continuous Infusions: . sodium chloride    . ceFEPime (MAXIPIME) IV Stopped (09/30/19 2206)  . feeding supplement (GLUCERNA 1.2 CAL) 30 mL/hr at 09/29/19 2100  . fentaNYL infusion INTRAVENOUS 25 mcg/hr (10/01/19 0400)  . norepinephrine (LEVOPHED) Adult infusion 40 mcg/min (10/01/19 0400)  . sodium chloride Stopped (10/01/19 0127)     LOS: 5 days   The patient is  critically ill with multiple organ systems failure and requires high complexity decision making for assessment and support, frequent evaluation and titration of therapies, application of advanced monitoring technologies and extensive interpretation of multiple databases. Critical Care Time devoted to patient care services described in this note  Time spent: 40 minutes     Cobi Delph, Geraldo Docker, MD [facility] Pager [phone]  If 7PM-7AM, please contact night-coverage  [email or web address] Password [email or web address] 10/01/2019, 7:52 AM

## 2019-10-02 ENCOUNTER — Other Ambulatory Visit (HOSPITAL_COMMUNITY): Payer: Medicare Other

## 2019-10-02 ENCOUNTER — Inpatient Hospital Stay (HOSPITAL_COMMUNITY): Payer: Medicare Other

## 2019-10-02 ENCOUNTER — Encounter (HOSPITAL_COMMUNITY): Payer: Self-pay | Admitting: Nephrology

## 2019-10-02 ENCOUNTER — Encounter (HOSPITAL_COMMUNITY): Payer: Medicare Other

## 2019-10-02 DIAGNOSIS — M7989 Other specified soft tissue disorders: Secondary | ICD-10-CM

## 2019-10-02 DIAGNOSIS — E43 Unspecified severe protein-calorie malnutrition: Secondary | ICD-10-CM | POA: Diagnosis not present

## 2019-10-02 DIAGNOSIS — R0989 Other specified symptoms and signs involving the circulatory and respiratory systems: Secondary | ICD-10-CM

## 2019-10-02 DIAGNOSIS — R400 Somnolence: Secondary | ICD-10-CM | POA: Diagnosis not present

## 2019-10-02 DIAGNOSIS — I749 Embolism and thrombosis of unspecified artery: Secondary | ICD-10-CM | POA: Diagnosis not present

## 2019-10-02 DIAGNOSIS — J9601 Acute respiratory failure with hypoxia: Secondary | ICD-10-CM | POA: Diagnosis not present

## 2019-10-02 DIAGNOSIS — R570 Cardiogenic shock: Secondary | ICD-10-CM | POA: Diagnosis present

## 2019-10-02 DIAGNOSIS — G9340 Encephalopathy, unspecified: Secondary | ICD-10-CM | POA: Diagnosis not present

## 2019-10-02 DIAGNOSIS — U071 COVID-19: Secondary | ICD-10-CM | POA: Diagnosis not present

## 2019-10-02 LAB — CBC WITH DIFFERENTIAL/PLATELET
Abs Immature Granulocytes: 0.17 10*3/uL — ABNORMAL HIGH (ref 0.00–0.07)
Basophils Absolute: 0 10*3/uL (ref 0.0–0.1)
Basophils Relative: 0 %
Eosinophils Absolute: 0 10*3/uL (ref 0.0–0.5)
Eosinophils Relative: 0 %
HCT: 20 % — ABNORMAL LOW (ref 36.0–46.0)
Hemoglobin: 6.4 g/dL — CL (ref 12.0–15.0)
Immature Granulocytes: 1 %
Lymphocytes Relative: 5 %
Lymphs Abs: 0.7 10*3/uL (ref 0.7–4.0)
MCH: 29.8 pg (ref 26.0–34.0)
MCHC: 32 g/dL (ref 30.0–36.0)
MCV: 93 fL (ref 80.0–100.0)
Monocytes Absolute: 1.8 10*3/uL — ABNORMAL HIGH (ref 0.1–1.0)
Monocytes Relative: 11 %
Neutro Abs: 13.5 10*3/uL — ABNORMAL HIGH (ref 1.7–7.7)
Neutrophils Relative %: 83 %
Platelets: 102 10*3/uL — ABNORMAL LOW (ref 150–400)
RBC: 2.15 MIL/uL — ABNORMAL LOW (ref 3.87–5.11)
RDW: 15.5 % (ref 11.5–15.5)
WBC: 16.2 10*3/uL — ABNORMAL HIGH (ref 4.0–10.5)
nRBC: 1.1 % — ABNORMAL HIGH (ref 0.0–0.2)

## 2019-10-02 LAB — HEPARIN INDUCED PLATELET AB (HIT ANTIBODY): Heparin Induced Plt Ab: 0.097 OD (ref 0.000–0.400)

## 2019-10-02 LAB — MAGNESIUM: Magnesium: 2.1 mg/dL (ref 1.7–2.4)

## 2019-10-02 LAB — D-DIMER, QUANTITATIVE: D-Dimer, Quant: 1.56 ug/mL-FEU — ABNORMAL HIGH (ref 0.00–0.50)

## 2019-10-02 LAB — COMPREHENSIVE METABOLIC PANEL
ALT: 15 U/L (ref 0–44)
AST: 16 U/L (ref 15–41)
Albumin: 1.8 g/dL — ABNORMAL LOW (ref 3.5–5.0)
Alkaline Phosphatase: 66 U/L (ref 38–126)
Anion gap: 8 (ref 5–15)
BUN: 68 mg/dL — ABNORMAL HIGH (ref 8–23)
CO2: 17 mmol/L — ABNORMAL LOW (ref 22–32)
Calcium: 7.7 mg/dL — ABNORMAL LOW (ref 8.9–10.3)
Chloride: 115 mmol/L — ABNORMAL HIGH (ref 98–111)
Creatinine, Ser: 4.93 mg/dL — ABNORMAL HIGH (ref 0.44–1.00)
GFR calc Af Amer: 10 mL/min — ABNORMAL LOW (ref >60)
GFR calc non Af Amer: 8 mL/min — ABNORMAL LOW (ref >60)
Glucose, Bld: 362 mg/dL — ABNORMAL HIGH (ref 70–99)
Potassium: 4.3 mmol/L (ref 3.5–5.1)
Sodium: 140 mmol/L (ref 135–145)
Total Bilirubin: 0.4 mg/dL (ref 0.3–1.2)
Total Protein: 4.3 g/dL — ABNORMAL LOW (ref 6.5–8.1)

## 2019-10-02 LAB — CRYPTOCOCCAL ANTIGEN: Crypto Ag: NEGATIVE

## 2019-10-02 LAB — POCT I-STAT 7, (LYTES, BLD GAS, ICA,H+H)
Acid-base deficit: 9 mmol/L — ABNORMAL HIGH (ref 0.0–2.0)
Bicarbonate: 15.5 mmol/L — ABNORMAL LOW (ref 20.0–28.0)
Calcium, Ion: 1.24 mmol/L (ref 1.15–1.40)
HCT: 17 % — ABNORMAL LOW (ref 36.0–46.0)
Hemoglobin: 5.8 g/dL — CL (ref 12.0–15.0)
O2 Saturation: 99 %
Patient temperature: 36.9
Potassium: 4.5 mmol/L (ref 3.5–5.1)
Sodium: 143 mmol/L (ref 135–145)
TCO2: 16 mmol/L — ABNORMAL LOW (ref 22–32)
pCO2 arterial: 26.9 mmHg — ABNORMAL LOW (ref 32.0–48.0)
pH, Arterial: 7.369 (ref 7.350–7.450)
pO2, Arterial: 121 mmHg — ABNORMAL HIGH (ref 83.0–108.0)

## 2019-10-02 LAB — GLUCOSE, CAPILLARY
Glucose-Capillary: 197 mg/dL — ABNORMAL HIGH (ref 70–99)
Glucose-Capillary: 222 mg/dL — ABNORMAL HIGH (ref 70–99)
Glucose-Capillary: 238 mg/dL — ABNORMAL HIGH (ref 70–99)
Glucose-Capillary: 268 mg/dL — ABNORMAL HIGH (ref 70–99)
Glucose-Capillary: 289 mg/dL — ABNORMAL HIGH (ref 70–99)
Glucose-Capillary: 353 mg/dL — ABNORMAL HIGH (ref 70–99)
Glucose-Capillary: 409 mg/dL — ABNORMAL HIGH (ref 70–99)
Glucose-Capillary: 430 mg/dL — ABNORMAL HIGH (ref 70–99)

## 2019-10-02 LAB — PHOSPHORUS: Phosphorus: 4.7 mg/dL — ABNORMAL HIGH (ref 2.5–4.6)

## 2019-10-02 LAB — SODIUM
Sodium: 141 mmol/L (ref 135–145)
Sodium: 142 mmol/L (ref 135–145)

## 2019-10-02 LAB — FIBRINOGEN: Fibrinogen: 353 mg/dL (ref 210–475)

## 2019-10-02 LAB — PREPARE RBC (CROSSMATCH)

## 2019-10-02 LAB — PROTIME-INR
INR: 1.2 (ref 0.8–1.2)
Prothrombin Time: 14.7 seconds (ref 11.4–15.2)

## 2019-10-02 LAB — OCCULT BLOOD X 1 CARD TO LAB, STOOL: Fecal Occult Bld: POSITIVE — AB

## 2019-10-02 LAB — LACTATE DEHYDROGENASE: LDH: 295 U/L — ABNORMAL HIGH (ref 98–192)

## 2019-10-02 LAB — C-REACTIVE PROTEIN: CRP: 1.2 mg/dL — ABNORMAL HIGH (ref <1.0)

## 2019-10-02 MED ORDER — FENTANYL CITRATE (PF) 100 MCG/2ML IJ SOLN
25.0000 ug | INTRAMUSCULAR | Status: DC | PRN
  Administered 2019-10-02 – 2019-10-04 (×6): 50 ug via INTRAVENOUS
  Filled 2019-10-02 (×6): qty 2

## 2019-10-02 MED ORDER — INSULIN ASPART 100 UNIT/ML ~~LOC~~ SOLN
0.0000 [IU] | SUBCUTANEOUS | Status: DC
  Administered 2019-10-02: 20 [IU] via SUBCUTANEOUS
  Administered 2019-10-02: 4 [IU] via SUBCUTANEOUS
  Administered 2019-10-02: 20 [IU] via SUBCUTANEOUS
  Administered 2019-10-02: 7 [IU] via SUBCUTANEOUS
  Administered 2019-10-02: 20 [IU] via SUBCUTANEOUS
  Administered 2019-10-02: 11 [IU] via SUBCUTANEOUS

## 2019-10-02 MED ORDER — TACROLIMUS 1 MG/ML ORAL SUSPENSION
1.0000 mg | Freq: Every day | ORAL | Status: DC
  Administered 2019-10-03 – 2019-10-04 (×2): 1 mg via ORAL
  Filled 2019-10-02 (×9): qty 1

## 2019-10-02 MED ORDER — SODIUM CHLORIDE 0.9% IV SOLUTION: Freq: Once | INTRAVENOUS | Status: AC

## 2019-10-02 MED ORDER — STERILE WATER FOR INJECTION IV SOLN
INTRAVENOUS | Status: DC
  Administered 2019-10-02 – 2019-10-04 (×3): via INTRAVENOUS
  Filled 2019-10-02 (×7): qty 9.71

## 2019-10-02 MED ORDER — INSULIN ASPART 100 UNIT/ML ~~LOC~~ SOLN
10.0000 [IU] | SUBCUTANEOUS | Status: DC
  Administered 2019-10-02 – 2019-10-04 (×9): 10 [IU] via SUBCUTANEOUS

## 2019-10-02 MED ORDER — TACROLIMUS 1 MG/ML ORAL SUSPENSION
1.5000 mg | Freq: Every day | ORAL | Status: DC
  Administered 2019-10-02: 1.5 mg via ORAL
  Filled 2019-10-02 (×5): qty 1.5

## 2019-10-02 MED ORDER — INSULIN ASPART 100 UNIT/ML ~~LOC~~ SOLN: 6.0000 [IU] | SUBCUTANEOUS | Status: DC

## 2019-10-02 MED ORDER — SODIUM CHLORIDE 0.9% IV SOLUTION
Freq: Once | INTRAVENOUS | Status: AC
  Administered 2019-10-02: 09:00:00 via INTRAVENOUS

## 2019-10-02 MED ORDER — INSULIN ASPART 100 UNIT/ML ~~LOC~~ SOLN
0.0000 [IU] | SUBCUTANEOUS | Status: DC
  Administered 2019-10-02: 11 [IU] via SUBCUTANEOUS
  Administered 2019-10-02 – 2019-10-03 (×3): 7 [IU] via SUBCUTANEOUS
  Administered 2019-10-03 – 2019-10-04 (×3): 4 [IU] via SUBCUTANEOUS
  Administered 2019-10-04: 7 [IU] via SUBCUTANEOUS
  Administered 2019-10-04: 11 [IU] via SUBCUTANEOUS

## 2019-10-02 MED ORDER — METOPROLOL TARTRATE 5 MG/5ML IV SOLN: 2.5000 mg | INTRAVENOUS | Status: DC | PRN

## 2019-10-02 MED FILL — Medication: Qty: 1 | Status: AC

## 2019-10-02 NOTE — Progress Notes (Signed)
Per CCM MD 10cc saline flush instilled down ETT. Pt suctioned for a moderate amount of tan secretions. Pt tolerated well, RT will continue to monitor.

## 2019-10-02 NOTE — Progress Notes (Signed)
Physical Therapy Treatment Patient Details Name: [name] MRN: [id number] DOB: 1946-09-27 Today's Date: 10/02/2019    History of Present Illness 73 y.o. 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 Patient was discharged home 10/16 last week from [facility] for COVID-19 viral gastroenteritis, AKI on CKD, and pneumonitis with acute hypoxic respiratory failure.  She was treated with a course of Rocephin, azithromycin as well as IV steroids and remdesivir. MRI 10/24 - no acute change. Trasnferred to ICU 10/24;intubated. Extubated 10/25. EEG 10/25 diffuse encephalopathy. Re-intubated 10/27 and also found to have R UE brachial DVT on 10/27.      PT Comments    Pt weaning on vent all day, settings are stable at PEEP 5, FiO2 40%, RR and O2 sats stable.  Per RN she was alert and following commands earlier today, but has just been sedated for R UE [place].  Pt repositioned in chair mode in bed with HOB 45 degrees, pt lifting her upper trunk up off of the bed and reaching for her ETT with her right upper extremity (weaker more painful arm).  She did not follow any commands for me, but this is likely the sedative.  She would open her eyes to her name.  She tolerated  3 extremity ROM and repositioning well.  Pt's goals down graded due to change in her status.  PT will continue to follow acutely for safe mobility progression   Follow Up Recommendations  SNF     Equipment Recommendations  Wheelchair (measurements PT);Wheelchair cushion (measurements PT);Hospital bed    Recommendations for Other Services   NA     Precautions / Restrictions Precautions Precautions: Fall;Other (comment) Precaution  Comments: vent, tube feed Restrictions Weight Bearing Restrictions: No    Mobility  Bed Mobility Overal bed mobility: Needs Assistance Bed Mobility: Rolling Rolling: Total assist;+2 for physical assistance         General bed mobility comments: Two person total assist to roll bil for peri care/clean up.  Pt once positioned in chair mode did initiate trunk flexion and lift her upper chest up off of the bed.  She also started reachign for her ETT with her R UE         Cognition Arousal/Alertness: Lethargic;Suspect due to medications(per RN, she increased her sedation for UE [place] just prior ) Behavior During Therapy: Restless;Impulsive Overall Cognitive Status: Impaired/Different from baseline                     Current Attention Level: Focused     Safety/Judgement: Decreased awareness of deficits;Decreased awareness of safety Awareness: Intellectual   General Comments: Pt did not follow any commads for me today, however, pt's RN reports that she was following commands earlier on less sedation.       Exercises General Exercises - Upper Extremity Shoulder Flexion: PROM;Left;10 reps Elbow Flexion: PROM;Left;10 reps Wrist Extension: PROM;Left;10 reps General Exercises - Lower Extremity Ankle Circles/Pumps: PROM;Both;10 reps Heel Slides: PROM;Both;10 reps Other Exercises Other Exercises: Assessed heels for pressure, no signs of pressure injury at this time, floated and pt positioned in chair mode with HOB 45 degrees.      General Comments General comments (skin integrity, edema, etc.): Pt on CPAP, FiO2 40%, PEEP 5, O2 sats 100%,  RR 18-20.        Pertinent Vitals/Pain Pain Assessment: Faces Faces Pain Scale: Hurts even more Pain Location: generalized Pain Descriptors / Indicators: Grimacing;Guarding Pain Intervention(s): Limited activity within patient's tolerance;Monitored during session;Repositioned           PT Goals (current goals can now be found in the  care plan section) Acute Rehab PT Goals PT Goal Formulation: Patient unable to participate in goal setting Time For Goal Achievement: 10/16/19 Potential to Achieve Goals: Fair Progress towards PT goals: Not progressing toward goals - comment(has regressed medically since we last saw her)    Frequency    Min 2X/week      PT Plan Current plan remains appropriate       AM-PAC PT "6 Clicks" Mobility   Outcome Measure  Help needed turning from your back to your side while in a flat bed without using bedrails?: Total Help needed moving from lying on your back to sitting on the side of a flat bed without using bedrails?: Total Help needed moving to and from a bed to a chair (including a wheelchair)?: Total Help needed standing up from a chair using your arms (e.g., wheelchair or bedside chair)?: Total Help needed to walk in hospital room?: Total Help needed climbing 3-5 steps with a railing? : Total 6 Click Score: 6    End of Session Equipment Utilized During Treatment: Oxygen Activity Tolerance: Patient limited by lethargy Patient left: in bed;with call bell/phone within reach;with nursing/sitter in room   PT Visit Diagnosis: Unsteadiness on feet (R26.81);Muscle weakness (generalized) (M62.81)     Time: [id number] PT Time Calculation (min) (ACUTE ONLY): 22 min  Charges:  $Therapeutic Activity: 8-22 mins                    Erynn Vaca B. Mouhamadou Gittleman, PT, DPT  Acute Rehabilitation [phone] pager [phone] office  @ [facility]ussel: [phone]   10/02/2019, 5:43 PM

## 2019-10-02 NOTE — Progress Notes (Signed)
OT Cancellation Note  Patient Details Name: [name] MRN: [id number] DOB: 03/23/1946   Cancelled Treatment:    Reason Eval/Treat Not Completed: Patient not medically ready  Render Marley,HILLARY 10/02/2019, 8:48 AM  Maurie Boettcher, OT/L   Acute OT Clinical Specialist Acute Rehabilitation Services Pager [phone] Office [phone]

## 2019-10-02 NOTE — Progress Notes (Signed)
Bedside nurse  spoke with patients  daughter at 12:00 and 16:00 today. Updated on bedside tests and patients condition. All questions answered.

## 2019-10-02 NOTE — Progress Notes (Signed)
PROGRESS NOTE    [name]  GUR:[id number] DOB: 31-Aug-1946 DOA: 10/03/2019 PCP: Earlene Plater, MD   Brief Narrative:  73 year old BF PMHx  renal transplantation, chronic kidney disease stage IV, HIV on antiretrovirals, chronic hepatitis C, and DM 2   who was hospitalized at [facility] 10/9 > 10/16 for Covid gastroenteritis and pneumonitis complicated by acute kidney injury.  She was referred back to the ED 10/22 by her PCP with complaints of weakness and lethargy.  In the ED she was found to have worsening infiltrates on a CXR and was admitted to [facility].    Subjective: 10/29 intubated/minimal sedation patient withdraws to pain when RIGHT arm raised/manipulated   Assessment & Plan:   Active Problems:   Encounter for intubation   COVID-19   Pneumonia due to COVID-19 virus   Altered mental status   Acute respiratory failure with hypoxemia (HCC)   Acute encephalopathy   Shock circulatory ([place])  Pneumonitis/bilateral pulmonary infiltrates -Etiology unclear -BAL specimen; lymphocytic predominant -PCCM following  -Completed course remdesivir last hospitalization -Was reintubated emergently now back on vent, vent management and sedation per PCCM  COVID-19 Labs  Recent Labs    10/01/19 0914 10/01/19 2010 10/02/19 0501 10/02/19 0507  DDIMER 1.77* 1.38* 1.56*  --   LDH  --   --  295*  --   CRP 1.6*  --   --  1.2*    Lab Results  Component Value Date   SARSCOV2NAA POSITIVE (A) 09/12/2019   Singac Not Detected 28/31/5176   Acute metabolic encephalopathy -MRI unrevealing -EEG without acute helpful findings -10/28 neurology completed LP will awaiting findings -16/07 acute metabolic encephalopathy -37/10 repeat head CT pending -10/29 have discontinued cefepime per neurology and other medications per pharmacy the may cause myoclonus..   -10/29 tacrolimus level pending -  Myoclonic jerks -See acute encephalopathy   Acute  RIGHT upper extremity DVT -10/28 pharmacy restarted anticoagulation at prophylactic dose following LP.  Patient had significant drop in H/H on 10/27, will address whether to increase to therapeutic level in a.m. -10/29 hold heparin   ACUTE RIGHT upper extremity compartment syndrome? -Can no longer palpate radial/ulnar pulses in right upper extremity, although still dopplerable.  Area of DVT/bruising appears to be spreading, increased firmness pain to palpation -Arterial Doppler pending -Dependent upon findings of arterial Doppler patient may require transportation to [facility] to be evaluated by vascular surgery for surgical resolution  Acute bleed -Transfuse for hemoglobin<7 -Patient removed from Covid PACT trial secondary to significant drop in H/H -10/27 transfuse 1 unit PRBC -10/28 CT head negative for acute bleed -10/28 CT abdomen pelvis negative for acute bleed -Occult blood pending -Gastroccult pending -Haptoglobin/LDH pending -Anemia panel pending -HIT panel pending -DIC panel pending -10/29 transfuse 1 unit PRBC -10/29 hold heparin  Thrombocytopenia -No clear evidence of occult bleeding monitor closely  Cardiogenic shock -Patient continues to require norepinephrine to maintain adequate perfusion pressure. -Titrate norepinephrine to maintain MAP>65  Diabetes type 2 uncontrolled with complication -62/69 hemoglobin A1c= 7.7 -10/28 increase Lantus 15 units daily -10/28 increase NovoLog moderate  S/p renal transplant -Pharmacy managing CellCept and tacrolimus dosing. -10/29 tacrolimus dose pending  Acute on CKD stage III/IV (baseline Cr 2.89-3.56) Recent Labs  Lab 09/30/19 0415 10/01/19 0500 10/01/19 0914 10/01/19 2010 10/02/19 0501  CREATININE 3.04* 3.85* 4.38* 3.86* 4.93*  -Patient with worsening renal function will consult nephrology in the a.m. most likely will require CRRT -Pharmacy to dose tacrolimus according to renal failure  Metabolic  acidosis -Bicarb drip 29m/hr  Hypernatremia -Resolved  Hypokalemia -Resolved; on the high side currently   Low TSH  -Free T4 is mildly elevated, but at this point I would be very hesitant to start her on any methimazole  HIV -Continue usual outpatient medical therapy   HLD  GERD  Deconditioning/debility PT/OT consultations  Severe protein calorie malnutrition -Continue feeding per nutrition recommendations    DVT prophylaxis: Subcu heparin Code Status: Full Family Communication: 10/29 spoke with TKenney Houseman(daughter) counseled her on plan of care answered all questions Disposition Plan: TBD   Consultants:  PCCM  Procedures/Significant Events:  [facility] and [facility] to ICU with altered mental status -intubated 10/24 MRI brain -no acute findings 10/25 extubated 10/25 EEG -posterior cortical dysfunction of nonspecific etiology with evidence of moderate to severe diffuse encephalopathy 10/27 right upper extremity venous Doppler significant for DVT. 10/27 transferred back to ICU secondary to respiratory distress and encephalopathy required intubation 10/28 LP performed by neurology 10/29 transfuse 1 unit PRBC   I have personally reviewed and interpreted all radiology studies and my findings are as above.  VENTILATOR SETTINGS: Vent mode; CPAP Rate set;  Vt Set;  PEEP; 5  Pressure support; 10 FiO2; 40%     Cultures 10/23 MRSA by PCR negative 10/24 BAL acid-fast culture pending 10/24 BAL smear  10/24 BAL consistent with normal flora 10/28 LP fungus pending 10/28 LP CSF, NGTD     Antimicrobials: Anti-infectives (From admission, onward)   Start     Stop   10/01/19 2100  piperacillin-tazobactam (ZOSYN) IVPB 2.25 g         09/28/19 1000  lamiVUDine (EPIVIR) 10 MG/ML solution 100 mg         09/28/19 0600  vancomycin (VANCOCIN) IVPB 1000 mg/200 mL premix   Status:  Discontinued     09/28/19 1245   09/26/19 1000  dolutegravir (TIVICAY) tablet 50 mg         09/26/19 1000  lamiVUDine (EPIVIR) 10 MG/ML solution 100 mg  Status:  Discontinued     09/27/19 1431   09/26/19 0030  vancomycin (VANCOCIN) 1,250 mg in sodium chloride 0.9 % 250 mL IVPB     09/26/19 0404   09/26/19 0030  ceFEPIme (MAXIPIME) 2 g in sodium chloride 0.9 % 100 mL IVPB  Status:  Discontinued     10/01/19 1323       Devices    LINES / TUBES:      Continuous Infusions:  sodium chloride     feeding supplement (VITAL AF 1.2 CAL) 1,000 mL (10/01/19 1700)   fentaNYL infusion INTRAVENOUS 20 mcg/hr (10/02/19 0700)   norepinephrine (LEVOPHED) Adult infusion 16 mcg/min (10/02/19 0700)   piperacillin-tazobactam (ZOSYN)  IV Stopped (10/02/19 0246)   sodium chloride Stopped (10/01/19 0127)     Objective: Vitals:   10/02/19 0500 10/02/19 0546 10/02/19 0600 10/02/19 0700  BP:  (!) 127/43 (!) 122/32 (!) 114/43  Pulse:  71 79 68  Resp:  (!) 23 (!) 23 (!) 23  Temp:  98.6 F (37 C) 98.2 F (36.8 C) 98.1 F (36.7 C)  TempSrc:  Esophageal    SpO2:  100% 96% 100%  Weight: 68.9 kg     Height:        Intake/Output Summary (Last 24 hours) at 10/02/2019 0723 Last data filed at 10/02/2019 0700 Gross per 24 hour  Intake 2311.68 ml  Output 150  ml  Net 2161.68 ml   Filed Weights   09/30/19 0500 10/01/19 0500 10/02/19 0500  Weight: 65.4 kg 72 kg 68.9 kg   Physical Exam:  General: Intubated/minimal sedation withdraws to painful stimulation all RIGHT upper extremity, positive acute respiratory distress Eyes: negative scleral hemorrhage, negative anisocoria, negative icterus ENT: Negative Runny nose, negative gingival bleeding, #7.5 ETT in place Neck:  Negative scars, masses, torticollis, lymphadenopathy, JVD Lungs: Tachypnea clear to auscultation bilaterally without wheezes or crackles Cardiovascular: Regular rate and rhythm without murmur gallop or rub normal S1  and S2 Abdomen: negative abdominal pain, nondistended, positive soft, bowel sounds, no rebound, no ascites, no appreciable mass Extremities: RIGHT upper extremity swelling increasing, hand cold to touch, ulnar/radial pulse no longer palpable although dopplerable.  Increased cyanosis or edema bilateral lower  Skin: Negative rashes, lesions, ulcers Psychiatric: Intubated/mildly sedated Central nervous system: Withdraws to manipulation of right upper extremity  .     Data Reviewed: Care during the described time interval was provided by me .  I have reviewed this patient's available data, including medical history, events of note, physical examination, and all test results as part of my evaluation.   CBC: Recent Labs  Lab 09/29/19 0855 09/30/19 0415 09/30/19 2048 10/01/19 0500 10/01/19 0551 10/01/19 0914 10/01/19 2010 10/02/19 0323 10/02/19 0501  WBC 7.4 8.6 18.8* 16.8*  --  19.5*  --   --  16.2*  NEUTROABS 6.1 7.4  --  14.6*  --  15.7*  --   --  13.5*  HGB 7.5* 7.1* 4.8* 8.2* 8.5* 8.3*  --  5.8* 6.4*  HCT 23.5* 22.6* 15.2* 25.0* 25.0* 24.9*  --  17.0* 20.0*  MCV 88.7 90.4 91.0 91.2  --  91.2  --   --  93.0  PLT 83* 85* 110*   108* 127*  --  134* 96*  --  546*   Basic Metabolic Panel: Recent Labs  Lab 09/28/19 0500 09/29/19 0855 09/30/19 0415 10/01/19 0500 10/01/19 0551 10/01/19 0914 10/01/19 2010 10/02/19 0323 10/02/19 0501  NA 146* 144 142 145 143 144 143 143 140  K 3.7 3.3* 4.3 5.2* 5.3* 5.1 4.0 4.5 4.3  CL 116* 116* 116* 117*  --  118* 123*  --  115*  CO2 21* 21* 17* 17*  --  17* 13*  --  17*  GLUCOSE 167* 120* 224* 279*  --  278* 329*  --  362*  BUN 42* 42* 41* 56*  --  62* 53*  --  68*  CREATININE 2.88* 2.87* 3.04* 3.85*  --  4.38* 3.86*  --  4.93*  CALCIUM 9.7 8.9 8.6* 7.9*  --  8.0* 6.2*  --  7.7*  MG 2.0 1.8  --  2.1  --  2.1  --   --  2.1  PHOS  --   --   --  4.4  --  4.4  --   --  4.7*   GFR: Estimated Creatinine Clearance: 9.9 mL/min (A) (by C-G formula  based on SCr of 4.93 mg/dL (H)). Liver Function Tests: Recent Labs  Lab 09/29/19 0855 09/30/19 0415 10/01/19 0500 10/01/19 0914 10/01/19 2010 10/02/19 0501  AST 12* 13* 26 22  --  16  ALT _0 --  15  ALKPHOS 52 53 58 54  --  66  BILITOT 0.2* 0.6 0.4 0.5  --  0.4  PROT 5.3* 5.2* 4.9* 4.8*  --  4.3*  ALBUMIN 2.1* 2.1* 2.0* 2.0* 1.5* 1.8*  No results for input(s): LIPASE, AMYLASE in the last 168 hours. Recent Labs  Lab 09/27/19 0334 09/29/19 0855  AMMONIA 21 22   Coagulation Profile: Recent Labs  Lab 09/30/19 0415 09/30/19 2048 10/01/19 0500 10/01/19 2010 10/02/19 0501  INR 1.2 1.3* 1.1 1.3* 1.2   Cardiac Enzymes: No results for input(s): CKTOTAL, CKMB, CKMBINDEX, TROPONINI in the last 168 hours. BNP (last 3 results) No results for input(s): PROBNP in the last 8760 hours. HbA1C: No results for input(s): HGBA1C in the last 72 hours. CBG: Recent Labs  Lab 10/01/19 2002 10/01/19 2351 10/02/19 0135 10/02/19 0330 10/02/19 0555  GLUCAP 151* 430* 409* 353* 268*   Lipid Profile: No results for input(s): CHOL, HDL, LDLCALC, TRIG, CHOLHDL, LDLDIRECT in the last 72 hours. Thyroid Function Tests: Recent Labs    09/29/19 0855 09/30/19 0415  TSH 0.130*  --   FREET4  --  1.21*  T3FREE  --  2.5   Anemia Panel: Recent Labs    09/29/19 0855  VITAMINB12 320  FOLATE 7.3  TIBC 126*  IRON 31   Urine analysis:    Component Value Date/Time   COLORURINE YELLOW 09/28/2019 0100   APPEARANCEUR CLEAR 09/28/2019 0100   LABSPEC 1.015 09/28/2019 0100   PHURINE 5.0 09/28/2019 0100   GLUCOSEU >=500 (A) 09/28/2019 0100   HGBUR SMALL (A) 09/28/2019 0100   BILIRUBINUR NEGATIVE 09/28/2019 0100   KETONESUR 5 (A) 09/28/2019 0100   PROTEINUR >=300 (A) 09/28/2019 0100   UROBILINOGEN 0.2 03/02/2014 1441   NITRITE NEGATIVE 09/28/2019 0100   LEUKOCYTESUR NEGATIVE 09/28/2019 0100   Sepsis Labs: _0 (procalcitonin:4,lacticidven:4)  ) Recent Results (from the  past 240 hour(s))  Respiratory Panel by PCR     Status: None   Collection Time: 09/26/19  7:56 AM   Specimen: Nasopharyngeal Swab; Respiratory  Result Value Ref Range Status   Adenovirus NOT DETECTED NOT DETECTED Final   Coronavirus 229E NOT DETECTED NOT DETECTED Final    Comment: (NOTE) The Coronavirus on the Respiratory Panel, DOES NOT test for the novel  Coronavirus (2019 nCoV)    Coronavirus HKU1 NOT DETECTED NOT DETECTED Final   Coronavirus NL63 NOT DETECTED NOT DETECTED Final   Coronavirus OC43 NOT DETECTED NOT DETECTED Final   Metapneumovirus NOT DETECTED NOT DETECTED Final   Rhinovirus / Enterovirus NOT DETECTED NOT DETECTED Final   Influenza A NOT DETECTED NOT DETECTED Final   Influenza B NOT DETECTED NOT DETECTED Final   Parainfluenza Virus 1 NOT DETECTED NOT DETECTED Final   Parainfluenza Virus 2 NOT DETECTED NOT DETECTED Final   Parainfluenza Virus 3 NOT DETECTED NOT DETECTED Final   Parainfluenza Virus 4 NOT DETECTED NOT DETECTED Final   Respiratory Syncytial Virus NOT DETECTED NOT DETECTED Final   Bordetella pertussis NOT DETECTED NOT DETECTED Final   Chlamydophila pneumoniae NOT DETECTED NOT DETECTED Final   Mycoplasma pneumoniae NOT DETECTED NOT DETECTED Final    Comment: Performed at [facility], [place]. [address], [place], [place] [address]  MRSA PCR Screening     Status: None   Collection Time: 09/26/19 11:17 AM   Specimen: Nasal Mucosa; Nasopharyngeal  Result Value Ref Range Status   MRSA by PCR NEGATIVE NEGATIVE Final    Comment:        The GeneXpert MRSA Assay (FDA approved for NASAL specimens only), is one component of a comprehensive MRSA colonization surveillance program. It is not intended to diagnose MRSA infection nor to guide or monitor treatment for MRSA infections. Performed at [facility]  [place] [address], [place], [place] [address]   Culture, respiratory (non-expectorated)     Status: None   Collection Time: 09/27/19  12:40 PM   Specimen: Bronchoalveolar Lavage; Respiratory  Result Value Ref Range Status   Specimen Description   Final    BRONCHIAL ALVEOLAR LAVAGE Performed at [place] [address] [place], [place] [address]    Special Requests   Final    Immunocompromised Performed at [facility], [place] [address], [place], [place] [address]    Gram Stain   Final    FEW WBC PRESENT,BOTH PMN AND MONONUCLEAR RARE GRAM NEGATIVE RODS    Culture   Final    Consistent with normal respiratory flora. Performed at [facility], [place] [address] [place], [place] [address]    Report Status 09/30/2019 FINAL  Final  Fungus Culture With Stain     Status: None (Preliminary result)   Collection Time: 09/27/19 12:41 PM  Result Value Ref Range Status   Fungus Stain Final report  Final    Comment: (NOTE) Performed At: [facility] [place], [place] [id number] Rush Farmer MD [id number]    Fungus (Mycology) Culture PENDING  Incomplete  Aspergillus Ag, BAL/Serum     Status: None   Collection Time: 09/27/19 12:41 PM   Specimen: Bronchoalveolar Lavage; Respiratory  Result Value Ref Range Status   Aspergillus Ag, BAL/Serum 0.05 0.00 - 0.49 Index Final    Comment: (NOTE) Performed At: [facility] [place], [place] [id number] Rush Farmer MD [id number] Performed At: [facility] [address] [place], [place] [id number] Katina Degree MDPhD [id number]   Acid Fast Smear (AFB)     Status: None   Collection Time: 09/27/19 12:41 PM   Specimen: Bronchoalveolar Lavage; Respiratory  Result Value Ref Range Status   AFB Specimen Processing Concentration  Final   Acid Fast Smear Negative  Final    Comment: (NOTE) Performed At: [facility] [place], [place] [id number] Rush Farmer MD [id number]   Pneumocystis smear by DFA     Status: None   Collection Time: 09/27/19 12:41 PM    Specimen: Bronchoalveolar Lavage; Respiratory  Result Value Ref Range Status   Pneumocystis jiroveci Ag NEGATIVE  Final    Comment: Performed at [facility] Performed at [facility], [place] [address], [place], [place] [address]   Fungus Culture Result     Status: None   Collection Time: 09/27/19 12:41 PM  Result Value Ref Range Status   Result 1 Comment  Final    Comment: (NOTE) KOH/Calcofluor preparation:  no fungus observed. Performed At: [facility] [place], [place] [id number] Rush Farmer MD [id number]   CSF culture with Stat gram stain     Status: None (Preliminary result)   Collection Time: 10/01/19  2:09 PM   Specimen: CSF; Cerebrospinal Fluid  Result Value Ref Range Status   Specimen Description CSF  Final   Special Requests NONE  Final   Gram Stain   Final    RARE WBC PRESENT,BOTH PMN AND MONONUCLEAR NO ORGANISMS SEEN CYTOSPIN SMEAR Gram Stain Report Called to,Read Back By and Verified With: L.SPARKS AT 1804 ON 10/01/19 BY N.THOMPSON Performed at [facility], [place] [address], [place], [place] [address]    Culture PENDING  Incomplete   Report Status PENDING  Incomplete         Radiology Studies:  Ct Abdomen Pelvis Wo Contrast  Result Date: 10/01/2019 CLINICAL DATA:  Unexplained anemia. Unexplained hypotension. COVID-19. EXAM: CT ABDOMEN AND PELVIS WITHOUT CONTRAST TECHNIQUE: Multidetector CT imaging of the abdomen and pelvis was performed following the standard protocol without IV contrast. COMPARISON:  CT scan of the chest dated 09/26/2019 FINDINGS: Lower chest: There are small bilateral pleural effusions with patchy peripheral infiltrates in both lungs, unchanged. Heart is at the upper limits of normal in size. Aortic atherosclerosis. Hepatobiliary: No focal liver abnormality is seen. Status post cholecystectomy. No biliary dilatation. Pancreas: Unremarkable. No pancreatic ductal dilatation or  surrounding inflammatory changes. Spleen: Normal in size without focal abnormality. Adrenals/Urinary Tract: Adrenal glands are normal. Bilateral severe renal atrophy consistent with the patient's history of renal failure. Renal transplant in the right iliac fossa. No hydronephrosis. Bladder is normal. Stomach/Bowel: Stomach is within normal limits. Appendix appears normal. No evidence of bowel wall thickening, distention, or inflammatory changes. Vascular/Lymphatic: Extensive aortic atherosclerosis. No adenopathy. Reproductive: Status post hysterectomy. No adnexal masses. Other: No ascites, retroperitoneal hemorrhage, or other significant abnormality. Musculoskeletal: No acute or significant osseous findings. IMPRESSION: 1. No acute abnormalities of the abdomen or pelvis. Specifically, no evidence of hemorrhage in the abdomen or pelvis. 2. Small bilateral pleural effusions with patchy peripheral infiltrates in both lungs, unchanged. 3. Aortic atherosclerosis. Aortic Atherosclerosis (ICD10-I70.0). Electronically Signed   By: Lorriane Shire M.D.   On: 10/01/2019 12:59   Ct Head Wo Contrast  Result Date: 10/01/2019 CLINICAL DATA:  Altered level of consciousness. EXAM: CT HEAD WITHOUT CONTRAST TECHNIQUE: Contiguous axial images were obtained from the base of the skull through the vertex without intravenous contrast. COMPARISON:  September 25, 2019. FINDINGS: Brain: No evidence of acute infarction, hemorrhage, hydrocephalus, extra-axial collection or mass lesion/mass effect. Vascular: No hyperdense vessel or unexpected calcification. Skull: Normal. Negative for fracture or focal lesion. Sinuses/Orbits: No acute finding. Other: None. IMPRESSION: Normal head CT. Electronically Signed   By: Marijo Conception M.D.   On: 10/01/2019 12:55   Dg Chest Port 1 View  Result Date: 10/01/2019 CLINICAL DATA:  Endotracheal tube placement. EXAM: PORTABLE CHEST 1 VIEW COMPARISON:  September 30, 2019. FINDINGS: Stable  cardiomediastinal silhouette. Endotracheal and nasogastric tubes are unchanged in position. Left subclavian catheter is unchanged. No pneumothorax is noted. Stable bilateral lung opacities are noted consistent with pneumonia. Bony thorax is unremarkable. IMPRESSION: Stable support apparatus. Stable bilateral lung opacities are noted consistent with multifocal pneumonia. Electronically Signed   By: Marijo Conception M.D.   On: 10/01/2019 07:46   Dg Chest Port 1 View  Result Date: 09/30/2019 CLINICAL DATA:  Coronavirus infection. Ventilator support. EXAM: PORTABLE CHEST 1 VIEW COMPARISON:  09/27/2019 FINDINGS: Endotracheal tube tip is 3 cm above the carina. Orogastric or nasogastric tube enters the stomach. Left subclavian central line tip is in the right atrium. This is newly placed. No pneumothorax. Patchy bilateral pulmonary infiltrates consistent with viral pneumonia appear similar. No worsening or new finding. IMPRESSION: 1. Newly placed left subclavian central line with tip in the right atrium. No pneumothorax. 2. No change in patchy bilateral pulmonary infiltrates consistent with viral pneumonia. Electronically Signed   By: Nelson Chimes M.D.   On: 09/30/2019 16:42   Vas [place] Upper Extremity Venous Duplex  Result Date: 09/30/2019 UPPER VENOUS STUDY  Indications: Covid positive, and Swelling Limitations: Patient confusion, positioning. Comparison Study: No prior study on file for comparison Performing Technologist: Sharion Dove RVS  Examination Guidelines: A complete evaluation includes B-mode imaging, spectral Doppler, color  Doppler, and power Doppler as needed of all accessible portions of each vessel. Bilateral testing is considered an integral part of a complete examination. Limited examinations for reoccurring indications may be performed as noted.  Right Findings: +----------+------------+---------+-----------+----------+---------------------+  RIGHT       Compressible Phasicity Spontaneous Properties        Summary         +----------+------------+---------+-----------+----------+---------------------+  IJV                                                         Not visualized      +----------+------------+---------+-----------+----------+---------------------+  Subclavian                 Yes        Yes                 patent by color and                                                                    Doppler         +----------+------------+---------+-----------+----------+---------------------+  Axillary       Full        Yes        Yes                                       +----------+------------+---------+-----------+----------+---------------------+  Brachial       None        No         No                         Acute          +----------+------------+---------+-----------+----------+---------------------+  Radial                     Yes        Yes                  patent by Doppler    +----------+------------+---------+-----------+----------+---------------------+  Ulnar                                                       Not visualized      +----------+------------+---------+-----------+----------+---------------------+  Cephalic       None        No         No                         Acute          +----------+------------+---------+-----------+----------+---------------------+  Basilic  Not visualized      +----------+------------+---------+-----------+----------+---------------------+  Summary:  Right: Findings consistent with acute deep vein thrombosis involving the right brachial veins. Findings consistent with acute superficial vein thrombosis involving the right cephalic vein.  *See table(s) above for measurements and observations.  Diagnosing physician: Deitra Mayo MD Electronically signed by Deitra Mayo MD on 09/30/2019 at 4:57:10 PM.    Final         Scheduled Meds:   sodium chloride   Intravenous Once   amLODipine  10 mg Per Tube Daily   aspirin  81 mg Per Tube Daily   calcitRIOL  0.25 mcg Per Tube Daily   carvedilol  12.5 mg Per Tube BID WC   chlorhexidine gluconate (MEDLINE KIT)  15 mL Mouth Rinse BID   Chlorhexidine Gluconate Cloth  6 each Topical Daily   cinacalcet  60 mg Oral Daily   dolutegravir  50 mg Oral Daily   fentaNYL (SUBLIMAZE) injection  25 mcg Intravenous Once   free water  200 mL Per Tube Q6H   heparin injection (subcutaneous)  5,000 Units Subcutaneous Q8H   insulin aspart  0-20 Units Subcutaneous Q2H   insulin glargine  15 Units Subcutaneous QHS   lamiVUDine  100 mg Per Tube Daily   magnesium oxide  400 mg Per Tube Daily   mouth rinse  15 mL Mouth Rinse 10 times per day   methylPREDNISolone (SOLU-MEDROL) injection  30 mg Intravenous Daily   Mycophenolate 768m (1281mml) = 69m3m750 mg Per Tube BID   pantoprazole sodium  40 mg Per Tube Daily   QUEtiapine  12.5 mg Per Tube QHS   rosuvastatin  20 mg Per Tube Daily   sodium bicarbonate  650 mg Per Tube BID   sodium chloride flush  3 mL Intravenous Q12H   tacrolimus  2 mg Oral Daily   tacrolimus  3 mg Oral QHS   Continuous Infusions:  sodium chloride     feeding supplement (VITAL AF 1.2 CAL) 1,000 mL (10/01/19 1700)   fentaNYL infusion INTRAVENOUS 20 mcg/hr (10/02/19 0700)   norepinephrine (LEVOPHED) Adult infusion 16 mcg/min (10/02/19 0700)   piperacillin-tazobactam (ZOSYN)  IV Stopped (10/02/19 0246)   sodium chloride Stopped (10/01/19 0127)     LOS: 6 days   The patient is critically ill with multiple organ systems failure and requires high complexity decision making for assessment and support, frequent evaluation and titration of therapies, application of advanced monitoring technologies and extensive interpretation of multiple databases. Critical Care Time devoted to patient care services described in this note  Time spent: 40  minutes     Evelio Rueda, CURGeraldo DockerD [facility] Pager [phone] 7PM-7AM, please contact night-coverage [email or web address] Password [email or web address] 10/02/2019, 7:23 AM

## 2019-10-02 NOTE — Progress Notes (Signed)
VASCULAR LAB PRELIMINARY  PRELIMINARY  PRELIMINARY  PRELIMINARY  Right upper extremity arterial duplex completed.    Preliminary report:  See CV proc for preliminary results.   Jericho Cieslik, RVT 10/02/2019, 3:53 PM

## 2019-10-02 NOTE — Progress Notes (Signed)
NAME:  MCKELL RIECKE, MRN:  [id number], DOB:  [birth year], LOS: 6 ADMISSION DATE:  09/13/2019, CONSULTATION DATE:  10/23 REFERRING MD:  Sloan Leiter, CHIEF COMPLAINT:  Dyspnea   Brief History   73 y/o female admitted on 10/22 to [facility] for lethargy and pulmonary new pulmonary infiltrates that developed one week after she was treated for COVID pneumonia.  Past Medical History  DM2 PCP pneumonia 1998 HTN HIV Hep C GERD ESRD s/p renal transplant CHF > LV normal, moderate concentric LVH, LVEF 60-65%, left atrium moderately dilated  Significant Hospital Events   10/22 admission 10/24 moved to ICU for declining mental status, intubated, bronchoscopy performed 10/25 extubated  Consults:  Pulmonary   Procedures:  10/24 ETT > 10/25  Significant Diagnostic Tests:  10/23 CT chest images personally reviewed> patchy and nodular consolidation bilaterally predominantly in upper lobes and periphery but upper lobe predeominant bronchovascular distribution some patchy ggo in bases with pleural effusions bilaterally, ? Cavitary lesion RLL 10/22 CT head > NAICP 10/24 MRI Brain > no acute findings, motion artifact 10/25 EEG > suggestive of cortical dysfunction in posterior head region, non-specific etiology, moderate to severe diffuse encephalopathy, no seizures  Micro Data:  10/23 respiratory viral panel > negative 10/24 BAL bacterial > rare GNR on stain 10/24 BAL fungal >  10/24 BAL AFB >  10/24 BAL aspergillus antigen >  10/24 BAL PCP >    Antimicrobials:  10/22 vanc > 10/25 10/22 cefepime > 10/28 Zosyn 10/28>>>  Interim history/subjective:   Deteriorating UOP and renal function overnight LP done, negative but cultures pending Head CT done, negative Continues to require levophed for BP support  Objective   Blood pressure (!) 130/31, pulse 76, temperature 98.2 F (36.8 C), resp. rate (!) 22, height _0  (1.6 m), weight 68.9 kg, SpO2 100 %.  Intake/Output Summary (Last 24 hours) at  10/02/2019 0759 Last data filed at 10/02/2019 0700 Gross per 24 hour  Intake 2311.68 ml  Output 150 ml  Net 2161.68 ml   Filed Weights   09/30/19 0500 10/01/19 0500 10/02/19 0500  Weight: 65.4 kg 72 kg 68.9 kg   Examination:  General:  Acute on chronically ill appearing female, in bed, unresponsive HENT: [place]/AT, PERRL, EOM-spontaneous and MMM, ETT in place PULM: Coarse BS diffusely CV: RRR, Nl S1/S2 and -M/R/G GI: Soft, NT, ND and +BS MSK: normal bulk and tone, RUE severe edema, palpable is not palpable but dopplerable Neuro: Poorly responsive, not following commands  10/24 BAL fungal culture still pending but aspergillus ag is 0.05, PCP pending, per labcor is to result today  I reviewed CXR myself, ETT is in a good position and infiltrate noted  Discussed with TRH-MD  Resolved Hospital Problem list     Assessment & Plan:  Acute encephalopathy, improved  most likely toxic metabolic, MRI brain negative, with myoclonus Neurology input appreciated Continue thiamine D/C fentanyl drip F/U on CSF cultures PRN fentanyl as needed but minimize use EEG with metabolic disorders only Frequent orientation D/C Cefepime and continue zosyn for neurotoxicity risk  Bilateral infiltrates in immunocompromised host who has recovered from COVID: unclear etiology, lymphocytic predominant, unlikely to represent sarcoidosis or hypersensitivity pneumonitis; suspect a post covid inflammatory response or a fungal element Reintubated emergently PaO2 on ABG is  PS trials but no extubation given mental status CXR and ABG in AM Follow up BAL cytology, supposedly to result today Abx as ordered and f/u on cultures  Renal transplant/CKD Continue tacrolimus and cellcept Check tacrolimus level today  Monitor BMET and UOP Replace electrolytes as needed Bicarb drip Renal consult for CRRT  Hypotension: ?medication related Levophed and IVF for now Follow CVP Stress dose steroids Hold diureses D/C  coreg and place order for PRN metoprolol if reflex tachy occurs D/C amlodipine Called renal for CRRT  Need for sedation: resolved Stop sedation protocol  RUE edema: Arterial doppler, if negative then to [facility] for vascular evaluation Elevate arm Need anticoagulation but Hg loss is a concern, will check fecal occult blood  PCCM will continue to follow  Best practice:  Diet: NPO after midnight Pain/Anxiety/Delirium protocol (if indicated): n/a VAP protocol (if indicated): n/a DVT prophylaxis: sub q heparin GI prophylaxis: n/a Glucose control: per TRH Mobility: bed rest Code Status: full Family Communication: will clarify with TRH who will call family today Disposition: move to PCU  Labs   CBC: Recent Labs  Lab 09/29/19 0855 09/30/19 0415 09/30/19 2048 10/01/19 0500 10/01/19 0551 10/01/19 0914 10/01/19 2010 10/02/19 0323 10/02/19 0501  WBC 7.4 8.6 18.8* 16.8*  --  19.5*  --   --  16.2*  NEUTROABS 6.1 7.4  --  14.6*  --  15.7*  --   --  13.5*  HGB 7.5* 7.1* 4.8* 8.2* 8.5* 8.3*  --  5.8* 6.4*  HCT 23.5* 22.6* 15.2* 25.0* 25.0* 24.9*  --  17.0* 20.0*  MCV 88.7 90.4 91.0 91.2  --  91.2  --   --  93.0  PLT 83* 85* 110*  108* 127*  --  134* 96*  --  102*    Basic Metabolic Panel: Recent Labs  Lab 09/28/19 0500 09/29/19 0855 09/30/19 0415 10/01/19 0500 10/01/19 0551 10/01/19 0914 10/01/19 2010 10/02/19 0323 10/02/19 0501  NA 146* 144 142 145 143 144 143 143 140  K 3.7 3.3* 4.3 5.2* 5.3* 5.1 4.0 4.5 4.3  CL 116* 116* 116* 117*  --  118* 123*  --  115*  CO2 21* 21* 17* 17*  --  17* 13*  --  17*  GLUCOSE 167* 120* 224* 279*  --  278* 329*  --  362*  BUN 42* 42* 41* 56*  --  62* 53*  --  68*  CREATININE 2.88* 2.87* 3.04* 3.85*  --  4.38* 3.86*  --  4.93*  CALCIUM 9.7 8.9 8.6* 7.9*  --  8.0* 6.2*  --  7.7*  MG 2.0 1.8  --  2.1  --  2.1  --   --  2.1  PHOS  --   --   --  4.4  --  4.4  --   --  4.7*   GFR: Estimated Creatinine Clearance: 9.9 mL/min (A) (by C-G  formula based on SCr of 4.93 mg/dL (H)). Recent Labs  Lab 09/20/2019 1634  09/27/19 0500 09/28/19 0500 09/29/19 0855 09/30/19 0415 09/30/19 2048 10/01/19 0500 10/01/19 0914 10/02/19 0501  PROCALCITON  --    < > 0.18 0.20 0.32 0.18  --   --   --   --   WBC 6.5   < >  --  12.4* 7.4 8.6 18.8* 16.8* 19.5* 16.2*  LATICACIDVEN 1.9  --   --   --   --   --   --   --   --   --    < > = values in this interval not displayed.    Liver Function Tests: Recent Labs  Lab 09/29/19 0855 09/30/19 0415 10/01/19 0500 10/01/19 0914 10/01/19 2010 10/02/19 0501  AST 12* 13* 26  22  --  16  ALT _0 --  15  ALKPHOS 52 53 58 54  --  66  BILITOT 0.2* 0.6 0.4 0.5  --  0.4  PROT 5.3* 5.2* 4.9* 4.8*  --  4.3*  ALBUMIN 2.1* 2.1* 2.0* 2.0* 1.5* 1.8*   No results for input(s): LIPASE, AMYLASE in the last 168 hours. Recent Labs  Lab 09/27/19 0334 09/29/19 0855  AMMONIA 21 22    ABG    Component Value Date/Time   PHART 7.369 10/02/2019 0323   PCO2ART 26.9 (L) 10/02/2019 0323   PO2ART 121.0 (H) 10/02/2019 0323   HCO3 15.5 (L) 10/02/2019 0323   TCO2 16 (L) 10/02/2019 0323   ACIDBASEDEF 9.0 (H) 10/02/2019 0323   O2SAT 99.0 10/02/2019 0323     Coagulation Profile: Recent Labs  Lab 09/30/19 0415 09/30/19 2048 10/01/19 0500 10/01/19 2010 10/02/19 0501  INR 1.2 1.3* 1.1 1.3* 1.2    Cardiac Enzymes: No results for input(s): CKTOTAL, CKMB, CKMBINDEX, TROPONINI in the last 168 hours.  HbA1C: Hgb A1c MFr Bld  Date/Time Value Ref Range Status  09/27/2019 03:34 AM 7.7 (H) 4.8 - 5.6 % Final    Comment:    (NOTE) Pre diabetes:          5.7%-6.4% Diabetes:              >6.4% Glycemic control for   <7.0% adults with diabetes   06/17/2018 07:55 PM 6.9 (H) 4.8 - 5.6 % Final    Comment:    (NOTE) Pre diabetes:          5.7%-6.4% Diabetes:              >6.4% Glycemic control for   <7.0% adults with diabetes     CBG: Recent Labs  Lab 10/01/19 2351 10/02/19 0135 10/02/19  0330 10/02/19 0555 10/02/19 0740  GLUCAP 430* 409* 353* 268* 222*   The patient is critically ill with multiple organ systems failure and requires high complexity decision making for assessment and support, frequent evaluation and titration of therapies, application of advanced monitoring technologies and extensive interpretation of multiple databases.   Critical Care Time devoted to patient care services described in this note is  34  Minutes. This time reflects time of care of this signee Dr [name]. This critical care time does not reflect procedure time, or teaching time or supervisory time of PA/NP/Med student/Med Resident etc but could involve care discussion time.  Rush Farmer, M.D. [facility] Pulmonary/Critical Care Medicine.

## 2019-10-02 NOTE — Progress Notes (Signed)
Inpatient Diabetes Program Recommendations  AACE/ADA: New Consensus Statement on Inpatient Glycemic Control   Target Ranges:  Prepandial:   less than 140 mg/dL      Peak postprandial:   less than 180 mg/dL (1-2 hours)      Critically ill patients:  140 - 180 mg/dL  Results for KINARA, BOBIAN (MRN [id number]) as of 10/02/2019 07:55  Ref. Range 10/01/2019 08:20 10/01/2019 11:28 10/01/2019 17:00 10/01/2019 20:02 10/01/2019 23:51 10/02/2019 01:35 10/02/2019 03:30 10/02/2019 05:55 10/02/2019 07:40  Glucose-Capillary Latest Ref Range: 70 - 99 mg/dL 229 (H) 271 (H) 221 (H) 151 (H) 430 (H) 409 (H) 353 (H) 268 (H) 222 (H)    Review of Glycemic Control  Current orders for Inpatient glycemic control:Lantus 15 units QHS, Novolog 0-20 units Q2H; Solumedrol 30 mg daily, Vital @ 60 ml/hr  Inpatient Diabetes Program Recommendations:  Insulin-Basal: If steroids are continued, please consider increasing Lantus to 15 units BID (to start this morning).  Insulin-Tube Feeding: Please consider ordering Novolog 6 units Q4H for tube feeding coverage. If tube feeding is stopped or held then Novolog tube feeding coverage should also be stopped or held.  Thanks, Barnie Alderman, RN, MSN, CDE Diabetes Coordinator Inpatient Diabetes Program [phone] (Team Pager from 8am to 5pm)

## 2019-10-02 NOTE — Progress Notes (Signed)
Internal Medicine Clinic Attending  Case discussed with Dr. Melvin  at the time of the visit.  We reviewed the resident's history and exam and pertinent patient test results.  I agree with the assessment, diagnosis, and plan of care documented in the resident's note.  

## 2019-10-02 NOTE — Progress Notes (Signed)
PT Cancellation Note  Patient Details Name: [name] MRN: [id number] DOB: March 30, 1946   Cancelled Treatment:    Reason Eval/Treat Not Completed: Medical issues which prohibited therapy .  Although vent settings are looking better (CPAP, FiO2 40% and PEEP 5) her Hgb is 6.4 preventing any EOB activity.  PT will continue to follow for medical stability.    Thanks,  Barbarann Ehlers. Kaytee Taliercio, PT, DPT  Acute Rehabilitation [phone] pager [phone] office  @ [facility]: [phone]    Harvie Heck 10/02/2019, 9:38 AM

## 2019-10-02 NOTE — Consult Note (Signed)
Renal Service Consult Note [facility]  [name] 10/02/2019 Sol [place] Requesting Physician:  Dr Sherral Hammers  Reason for Consult:  Renal transplant pt w/ COVID PNA HPI: The patient is a 73 y.o. year-old with hx of DM2, HTN, HIV, hep C, HL and ESRD sp renal transplant in 2013 presented on 09/10/2019 with AMS and vomiting in setting or recent onset COVID infection/ pneumonia. Pt had been dc'd 10/16 from [facility] after admit for COVID-19 viral GE, AKI on CKD and pneumonitis w/ acute resp failure. She rec'd IV abx, IV steroids and remdesivir. At home she became increasingly somnolent and was taken to ED where eval showed high BP, creat 3.05 (down from 4.6 days prior), CXR w/ multifocal pna, CT head negative. Pt was admitted at [facility].  She worsened and was intubated on 10/24. Pressors were started on 10/27 w/ neo gtt at 20- 40 ml/min. BUN /Cr were 39/3.0 on admit, now are up to 68 and 4.93 today. Asked to see for renal failure.    Patient has been getting IV maxipime, dc'd yest.  Got IV vanc 2 doses, dc'd 2d ago. Getting her prograf at lower dose of 1mg  am and 1.5 mg pm, changed on today from 2mg  am and 3mg  pm. Mycophenolate 750 mg bid is ongoing. Getting IV solumedrol at 30 mg/ day. Other current meds are asa, sensipar, dolutegravir, insulin, lamivudine, statin.    Pt unable to give any hx.   ROS n/a  Past Medical History  Past Medical History:  Diagnosis Date  . CHF (congestive heart failure) ([place])   . Chronic kidney disease    ESRD secondary to DM, started HD in 2008, Dr. Jimmy Footman is her nephrologist, received renal transplant in 2013, no longer on dialysis (06/17/2018)  . Dialysis patient [facility])    T Th Sat  . Early cataracts, bilateral   . GERD (gastroesophageal reflux disease)   . Hepatitis C    untreated. VL 3700000 in 2008; pt reports this has been treated" (06/17/2018)  . High cholesterol   . History of blood transfusion 2013   "when I got the kidney" (06/17/2018)  .  History of hepatitis C 08/04/2015  . HIV infection ([place]) 1998   Dx in 1998 in [place], she presented with PCP pneumonia at that time. Has been tried on multiple regimens by her PCP in [place] before./ She has been on current ART for years now. Moved to [facility] in 2008 and is following with Dr. Tommy Medal since then.   . Hypertension   . Influenza-like illness 05/27/2019  . 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 PONV (postoperative nausea and vomiting)   . Type II diabetes mellitus ([place])   . Wears glasses    Past Surgical History  Past Surgical History:  Procedure Laterality Date  . ABDOMINAL HYSTERECTOMY     and oopherectomy for fibroids  . BASCILIC VEIN TRANSPOSITION Left 01/29/2018   Procedure: RIGHT BRACIOCEPHALIC AV FISTULA;  Surgeon: Angelia Mould, MD;  Location: [place];  Service: Vascular;  Laterality: Left;  . CHOLECYSTECTOMY OPEN    . KIDNEY TRANSPLANT  2013   Family History  Family History  Problem Relation Age of Onset  . Diabetes Mother   . Cancer Father   . Cancer Brother    Social History  reports that she quit smoking about 22 years ago. Her smoking use included cigarettes. She has a 3.96 pack-year smoking history. She has never used smokeless tobacco. She reports previous alcohol  use. She reports that she does not use drugs. Allergies  Allergies  Allergen Reactions  . Omeprazole Other (See Comments)    Interferes with the absorption of rilipivirine  . Eggs Or Egg-Derived Products Nausea And Vomiting  . Influenza Vaccines Other (See Comments)    Hallucination   Home medications Prior to Admission medications   Medication Sig Start Date End Date Taking? Authorizing Provider  amLODipine (NORVASC) 10 MG tablet TAKE 1 TABLET(10 MG) BY MOUTH DAILY Patient taking differently: Take 10 mg by mouth daily.  03/24/19  Yes Annia Belt, MD  aspirin 81 MG EC tablet Take 81 mg by mouth daily.     Yes [provider]  calcitRIOL (ROCALTROL) 0.25 MCG capsule  Take 0.25 mcg by mouth daily. 05/06/18  Yes [provider]  carvedilol (COREG) 25 MG tablet Take 1 tablet (25 mg total) by mouth 2 (two) times daily with a meal. 07/17/19  Yes Oda Kilts, MD  cinacalcet (SENSIPAR) 60 MG tablet Take 60 mg by mouth daily.   Yes [provider]  dolutegravir (TIVICAY) 50 MG tablet Take 1 tablet (50 mg total) by mouth daily. 11/05/18  Yes Tommy Medal, Lavell Islam, MD  FREESTYLE TEST STRIPS test strip Use to check blood sugar 4 times daily. diag code E11.9. insulin dependent 09/24/19  Yes Neva Seat, MD  furosemide (LASIX) 80 MG tablet Take 1 tablet (80 mg total) by mouth daily. 09/20/19  Yes Thurnell Lose, MD  Insulin Degludec-Liraglutide (XULTOPHY) 100-3.6 UNIT-MG/ML SOPN Inject 16 Units into the skin every morning. 07/17/19  Yes Oda Kilts, MD  Insulin Pen Needle 32G X 4 MM MISC 16 Units by Does not apply route daily. 05/17/18  Yes Lorella Nimrod, MD  lamivudine (EPIVIR) 100 MG tablet Take 1 tablet (100 mg total) by mouth daily. 11/05/18  Yes Tommy Medal, Lavell Islam, MD  Lancets (FREESTYLE) lancets Use as instructed 05/17/18  Yes Lorella Nimrod, MD  loperamide (IMODIUM A-D) 2 MG tablet Take 1 tablet (2 mg total) by mouth 3 (three) times daily as needed for diarrhea or loose stools. 09/19/19  Yes Thurnell Lose, MD  magnesium oxide (MAG-OX) 400 MG tablet Take 400 mg by mouth daily.   Yes [provider]  mycophenolate (MYFORTIC) 180 MG EC tablet Take 540 mg by mouth 2 (two) times daily.   Yes [provider]  ondansetron (ZOFRAN) 4 MG tablet Take 1 tablet (4 mg total) by mouth every 8 (eight) hours as needed for nausea or vomiting. 09/19/19  Yes Thurnell Lose, MD  pantoprazole (PROTONIX) 20 MG tablet Take 1 tablet (20 mg total) by mouth daily. 07/17/19  Yes Oda Kilts, MD  potassium chloride SA (K-DUR,KLOR-CON) 20 MEQ tablet Take 20 mEq by mouth daily. 05/13/18  Yes [provider]  predniSONE  (DELTASONE) 5 MG tablet Take 5 mg by mouth daily with breakfast.   Yes [provider]  rosuvastatin (CRESTOR) 20 MG tablet TAKE 1 TABLET(20 MG) BY MOUTH DAILY Patient taking differently: Take 20 mg by mouth daily.  07/30/19  Yes Sid Falcon, MD  sodium bicarbonate 650 MG tablet Take 1 tablet (650 mg total) by mouth 3 (three) times daily. 09/19/19  Yes Thurnell Lose, MD  tacrolimus (PROGRAF) 1 MG capsule Take 2-3 mg by mouth See admin instructions. Take 2 mg by mouth in the morning, then take 3 mg by mouth in the afternoon 10/01/13  Yes [provider]  VOLTAREN 1 % GEL  APPLY 2 GRAMS EXTERNALLY TO THE AFFECTED AREA FOUR TIMES DAILY Patient taking differently: Apply 2 g topically daily as needed. For pain 04/16/19  Ezekiel Slocumb, MD   Liver Function Tests Recent Labs  Lab 10/01/19 0500 10/01/19 0914 10/01/19 2010 10/02/19 0501  AST 26 22  --  16  ALT 16 18  --  15  ALKPHOS 58 54  --  66  BILITOT 0.4 0.5  --  0.4  PROT 4.9* 4.8*  --  4.3*  ALBUMIN 2.0* 2.0* 1.5* 1.8*   No results for input(s): LIPASE, AMYLASE in the last 168 hours. CBC Recent Labs  Lab 10/01/19 0500  10/01/19 0914 10/01/19 2010 10/02/19 0323 10/02/19 0501  WBC 16.8*  --  19.5*  --   --  16.2*  NEUTROABS 14.6*  --  15.7*  --   --  13.5*  HGB 8.2*   < > 8.3*  --  5.8* 6.4*  HCT 25.0*   < > 24.9*  --  17.0* 20.0*  MCV 91.2  --  91.2  --   --  93.0  PLT 127*  --  134* 96*  --  102*   < > = values in this interval not displayed.   Basic Metabolic Panel Recent Labs  Lab 09/28/19 0500 09/29/19 0855 09/30/19 0415 10/01/19 0500 10/01/19 0551 10/01/19 0914 10/01/19 2010 10/02/19 0323 10/02/19 0501 10/02/19 1300  NA 146* 144 142 145 143 144 143 143 140 141  K 3.7 3.3* 4.3 5.2* 5.3* 5.1 4.0 4.5 4.3  --   CL 116* 116* 116* 117*  --  118* 123*  --  115*  --   CO2 21* 21* 17* 17*  --  17* 13*  --  17*  --   GLUCOSE 167* 120* 224* 279*  --  278* 329*  --  362*  --   BUN 42* 42* 41*  56*  --  62* 53*  --  68*  --   CREATININE 2.88* 2.87* 3.04* 3.85*  --  4.38* 3.86*  --  4.93*  --   CALCIUM 9.7 8.9 8.6* 7.9*  --  8.0* 6.2*  --  7.7*  --   PHOS  --   --   --  4.4  --  4.4  --   --  4.7*  --    Iron/TIBC/Ferritin/ %Sat    Component Value Date/Time   IRON 31 09/29/2019 0855   TIBC 126 (L) 09/29/2019 0855   FERRITIN 517 (H) 09/27/2019 0334   IRONPCTSAT 25 09/29/2019 0855    Vitals:   10/02/19 1430 10/02/19 1445 10/02/19 1500 10/02/19 1527  BP: (!) 150/37 (!) 148/42 (!) 140/50 (!) 140/42  Pulse: 79 65 68 83  Resp: (!) 25 (!) 21 (!) 22 (!) 30  Temp: 98.8 F (37.1 C) 99 F (37.2 C) 99 F (37.2 C)   TempSrc:   Esophageal   SpO2: 100% 100% 100% 100%  Weight:      Height:        Exam Gen sedate, on vent No rash, cyanosis or gangrene Sclera anicteric, throat w ETT  No jvd or bruits Chest clear bilat to bases RRR no MRG Abd soft ntnd no mass or ascites +bs obese GU defer MS no joint effusions or deformity Ext no LE edema, RUE edema is 3+ w/ bruising Neuro is sedated on vent LUA AVF +bruit    Home meds:  - amlodipine 10/ carvedilol 25 bid/ furosdmie 80 qd/ KDur 20   -  tacrolimus 2 am+ 3 pm/ prednisone 5 am/ mycophenolate 540 bid  - insulin degludec-liraglutide 100-3.6 qam sq  - dolutegravir 50 qd  - aspirin 81/ rosuvastatin 20 qd  - cinacalcet 60/ na bicarb tid  - pantoprazole 20  - prn's/ vitamins/ supplements   UA 10/25 > >300 prot, 0-5 rbc/ wbc  cXR 10/29 - diffuse bilat asymmetric infiltrates   Baseline creat 3.50- 4.36 from 06/2018 - 05/2019  Assessment/ Plan:  AKI on CKD3/ transplant - not sure baseline, recent admit baseline 2.8- 3.2.  BUN/ creat up, no urgent indication for RRT at this time.  Will follow. Not getting any nephrotoxins at this time.  H/o renal transplant - in 2013. Getting IS meds, prograf lowered today by 50%.  Level pending.   COVID + PNA - per CCM  Hypotension - on levo gtt   Anemia CKD/ lab draws - Hb 6.4 this am,  transfuse prn. Will consider esa.    HIV  Hep C    Kelly Splinter  MD 10/02/2019, 4:12 PM

## 2019-10-02 NOTE — Progress Notes (Signed)
SLP Cancellation Note  Patient Details Name: DAZANI FELTMAN MRN: [id number] DOB: [birth year]   Cancelled treatment:       Reason Eval/Treat Not Completed: Medical issues which prohibited therapy; pt on vent.  Will follow along.  Cathe L. Tivis Ringer, [place] Office number [phone] Pager [phone]    Barbara, Vea Laurice 10/02/2019, 9:23 AM

## 2019-10-03 ENCOUNTER — Inpatient Hospital Stay (HOSPITAL_COMMUNITY): Payer: Medicare Other

## 2019-10-03 DIAGNOSIS — Z7189 Other specified counseling: Secondary | ICD-10-CM

## 2019-10-03 DIAGNOSIS — R400 Somnolence: Secondary | ICD-10-CM | POA: Diagnosis not present

## 2019-10-03 DIAGNOSIS — U071 COVID-19: Secondary | ICD-10-CM | POA: Diagnosis not present

## 2019-10-03 DIAGNOSIS — J9601 Acute respiratory failure with hypoxia: Secondary | ICD-10-CM | POA: Diagnosis not present

## 2019-10-03 DIAGNOSIS — E43 Unspecified severe protein-calorie malnutrition: Secondary | ICD-10-CM | POA: Diagnosis not present

## 2019-10-03 DIAGNOSIS — G9340 Encephalopathy, unspecified: Secondary | ICD-10-CM | POA: Diagnosis not present

## 2019-10-03 DIAGNOSIS — R570 Cardiogenic shock: Secondary | ICD-10-CM | POA: Diagnosis not present

## 2019-10-03 LAB — CBC WITH DIFFERENTIAL/PLATELET
Abs Immature Granulocytes: 0.14 10*3/uL — ABNORMAL HIGH (ref 0.00–0.07)
Basophils Absolute: 0 10*3/uL (ref 0.0–0.1)
Basophils Relative: 0 %
Eosinophils Absolute: 0 10*3/uL (ref 0.0–0.5)
Eosinophils Relative: 0 %
HCT: 24.9 % — ABNORMAL LOW (ref 36.0–46.0)
Hemoglobin: 8.3 g/dL — ABNORMAL LOW (ref 12.0–15.0)
Immature Granulocytes: 1 %
Lymphocytes Relative: 4 %
Lymphs Abs: 0.4 10*3/uL — ABNORMAL LOW (ref 0.7–4.0)
MCH: 29.4 pg (ref 26.0–34.0)
MCHC: 33.3 g/dL (ref 30.0–36.0)
MCV: 88.3 fL (ref 80.0–100.0)
Monocytes Absolute: 1.2 10*3/uL — ABNORMAL HIGH (ref 0.1–1.0)
Monocytes Relative: 12 %
Neutro Abs: 8.1 10*3/uL — ABNORMAL HIGH (ref 1.7–7.7)
Neutrophils Relative %: 83 %
Platelets: 81 10*3/uL — ABNORMAL LOW (ref 150–400)
RBC: 2.82 MIL/uL — ABNORMAL LOW (ref 3.87–5.11)
RDW: 15.5 % (ref 11.5–15.5)
WBC: 9.9 10*3/uL (ref 4.0–10.5)
nRBC: 0.6 % — ABNORMAL HIGH (ref 0.0–0.2)

## 2019-10-03 LAB — TYPE AND SCREEN
ABO/RH(D): O POS
Antibody Screen: NEGATIVE
Unit division: 0
Unit division: 0

## 2019-10-03 LAB — COMPREHENSIVE METABOLIC PANEL
ALT: 14 U/L (ref 0–44)
AST: 18 U/L (ref 15–41)
Albumin: 1.9 g/dL — ABNORMAL LOW (ref 3.5–5.0)
Alkaline Phosphatase: 74 U/L (ref 38–126)
Anion gap: 12 (ref 5–15)
BUN: 74 mg/dL — ABNORMAL HIGH (ref 8–23)
CO2: 18 mmol/L — ABNORMAL LOW (ref 22–32)
Calcium: 7.8 mg/dL — ABNORMAL LOW (ref 8.9–10.3)
Chloride: 111 mmol/L (ref 98–111)
Creatinine, Ser: 5.14 mg/dL — ABNORMAL HIGH (ref 0.44–1.00)
GFR calc Af Amer: 9 mL/min — ABNORMAL LOW (ref >60)
GFR calc non Af Amer: 8 mL/min — ABNORMAL LOW (ref >60)
Glucose, Bld: 212 mg/dL — ABNORMAL HIGH (ref 70–99)
Potassium: 3.8 mmol/L (ref 3.5–5.1)
Sodium: 141 mmol/L (ref 135–145)
Total Bilirubin: 0.9 mg/dL (ref 0.3–1.2)
Total Protein: 4.7 g/dL — ABNORMAL LOW (ref 6.5–8.1)

## 2019-10-03 LAB — POCT I-STAT 7, (LYTES, BLD GAS, ICA,H+H)
Acid-base deficit: <30 mmol/L — ABNORMAL HIGH (ref 0.0–2.0)
Acid-base deficit: <30 mmol/L — ABNORMAL HIGH (ref 0.0–2.0)
Bicarbonate: 15.4 mmol/L — ABNORMAL LOW (ref 20.0–28.0)
Bicarbonate: 15.2 mmol/L — ABNORMAL LOW (ref 20.0–28.0)
Calcium, Ion: 1.22 mmol/L (ref 1.15–1.40)
Calcium, Ion: 1.22 mmol/L (ref 1.15–1.40)
O2 Saturation: 34 %
O2 Saturation: 100 %
Patient temperature: 33.4
Patient temperature: 34.2
Potassium: 4.9 mmol/L (ref 3.5–5.1)
Potassium: 5.2 mmol/L — ABNORMAL HIGH (ref 3.5–5.1)
Sodium: 143 mmol/L (ref 135–145)
Sodium: 142 mmol/L (ref 135–145)
TCO2: 16 mmol/L — ABNORMAL LOW (ref 22–32)
TCO2: 16 mmol/L — ABNORMAL LOW (ref 22–32)
pCO2 arterial: 27.9 mmHg — ABNORMAL LOW (ref 32.0–48.0)
pCO2 arterial: 21.9 mmHg — ABNORMAL LOW (ref 32.0–48.0)
pH, Arterial: 7.33 — ABNORMAL LOW (ref 7.350–7.450)
pH, Arterial: 7.437 (ref 7.350–7.450)
pO2, Arterial: 18 mmHg — CL (ref 83.0–108.0)
pO2, Arterial: 293 mmHg — ABNORMAL HIGH (ref 83.0–108.0)

## 2019-10-03 LAB — BPAM RBC
Blood Product Expiration Date: 202011292359
Blood Product Expiration Date: 202011302359
ISSUE DATE / TIME: 202010272220
ISSUE DATE / TIME: 202010290727
Unit Type and Rh: 5100
Unit Type and Rh: 5100

## 2019-10-03 LAB — POCT I-STAT EG7
Acid-base deficit: 7 mmol/L — ABNORMAL HIGH (ref 0.0–2.0)
Bicarbonate: 16.9 mmol/L — ABNORMAL LOW (ref 20.0–28.0)
Calcium, Ion: 1.19 mmol/L (ref 1.15–1.40)
HCT: 23 % — ABNORMAL LOW (ref 36.0–46.0)
Hemoglobin: 7.8 g/dL — ABNORMAL LOW (ref 12.0–15.0)
O2 Saturation: 81 %
Patient temperature: 36.3
Potassium: 3.9 mmol/L (ref 3.5–5.1)
Sodium: 142 mmol/L (ref 135–145)
TCO2: 18 mmol/L — ABNORMAL LOW (ref 22–32)
pCO2, Ven: 27.8 mmHg — ABNORMAL LOW (ref 44.0–60.0)
pH, Ven: 7.389 (ref 7.250–7.430)
pO2, Ven: 44 mmHg (ref 32.0–45.0)

## 2019-10-03 LAB — GLUCOSE, CAPILLARY
Glucose-Capillary: 114 mg/dL — ABNORMAL HIGH (ref 70–99)
Glucose-Capillary: 169 mg/dL — ABNORMAL HIGH (ref 70–99)
Glucose-Capillary: 175 mg/dL — ABNORMAL HIGH (ref 70–99)
Glucose-Capillary: 228 mg/dL — ABNORMAL HIGH (ref 70–99)
Glucose-Capillary: 270 mg/dL — ABNORMAL HIGH (ref 70–99)
Glucose-Capillary: 97 mg/dL (ref 70–99)

## 2019-10-03 LAB — HEPARIN INDUCED PLATELET AB (HIT ANTIBODY): Heparin Induced Plt Ab: 0.073 OD (ref 0.000–0.400)

## 2019-10-03 LAB — MAGNESIUM: Magnesium: 2.1 mg/dL (ref 1.7–2.4)

## 2019-10-03 LAB — PHOSPHORUS: Phosphorus: 4.1 mg/dL (ref 2.5–4.6)

## 2019-10-03 LAB — TACROLIMUS LEVEL: Tacrolimus (FK506) - LabCorp: 6 ng/mL (ref 2.0–20.0)

## 2019-10-03 LAB — D-DIMER, QUANTITATIVE: D-Dimer, Quant: 1.6 ug/mL-FEU — ABNORMAL HIGH (ref 0.00–0.50)

## 2019-10-03 LAB — SODIUM
Sodium: 143 mmol/L (ref 135–145)
Sodium: 143 mmol/L (ref 135–145)

## 2019-10-03 LAB — C-REACTIVE PROTEIN: CRP: 1.1 mg/dL — ABNORMAL HIGH (ref <1.0)

## 2019-10-03 LAB — PROTIME-INR
INR: 1 (ref 0.8–1.2)
Prothrombin Time: 13.4 seconds (ref 11.4–15.2)

## 2019-10-03 LAB — HAPTOGLOBIN: Haptoglobin: 87 mg/dL (ref 37–355)

## 2019-10-03 MED FILL — Medication: Qty: 1 | Status: AC

## 2019-10-03 NOTE — Progress Notes (Signed)
[facility] Progress Note  Subjective: I/O +3.5 L, UOP 200 cc yest, B/Cr up to 74/5.14 today , CO2 18  Vitals:   10/03/19 1215 10/03/19 1230 10/03/19 1245 10/03/19 1300  BP:    (!) 132/45  Pulse: 69 66 67 72  Resp: (!) 22 (!) 21 (!) 21 (!) 21  Temp:      TempSrc:      SpO2: 100% 100% 100% 100%  Weight:      Height:        Inpatient medications: . aspirin  81 mg Per Tube Daily  . calcitRIOL  0.25 mcg Per Tube Daily  . chlorhexidine gluconate (MEDLINE KIT)  15 mL Mouth Rinse BID  . Chlorhexidine Gluconate Cloth  6 each Topical Daily  . cinacalcet  60 mg Oral Daily  . dolutegravir  50 mg Oral Daily  . insulin aspart  0-20 Units Subcutaneous Q4H  . insulin aspart  10 Units Subcutaneous Q4H  . insulin glargine  15 Units Subcutaneous QHS  . lamiVUDine  100 mg Per Tube Daily  . magnesium oxide  400 mg Per Tube Daily  . mouth rinse  15 mL Mouth Rinse 10 times per day  . methylPREDNISolone (SOLU-MEDROL) injection  30 mg Intravenous Daily  . Mycophenolate 71m (1263mml) = 50m69m750 mg Per Tube BID  . pantoprazole sodium  40 mg Per Tube Daily  . rosuvastatin  20 mg Per Tube Daily  . sodium bicarbonate  650 mg Per Tube BID  . sodium chloride flush  3 mL Intravenous Q12H  . tacrolimus  1 mg Oral Daily  . tacrolimus  1.5 mg Oral QHS   . sodium chloride    . feeding supplement (VITAL AF 1.2 CAL) 60 mL/hr at 10/03/19 1000  . norepinephrine (LEVOPHED) Adult infusion Stopped (10/03/19 0515)  . piperacillin-tazobactam (ZOSYN)  IV 2.25 g (10/03/19 1558)  .  sodium bicarbonate infusion 1/4 NS 1000 mL 50 mL/hr at 10/03/19 1554  . sodium chloride Stopped (10/01/19 0127)   sodium chloride, ALPRAZolam, dextrose, fentaNYL (SUBLIMAZE) injection, guaiFENesin-dextromethorphan, loperamide HCl, metoprolol tartrate, midazolam, midazolam, sodium chloride flush    Exam: Gen sedate, on vent No rash, cyanosis or gangrene Sclera anicteric, throat w ETT  No jvd or bruits Chest clear  bilat to bases RRR no MRG Abd soft ntnd no mass or ascites +bs obese GU defer MS no joint effusions or deformity Ext no LE edema, RUE edema is 3+ w/ bruising Neuro is sedated on vent LUA AVF +bruit    Home meds:  - amlodipine 10/ carvedilol 25 bid/ furosemide 80 qd/ KDur 20   - tacrolimus 2 am+ 3 pm/ prednisone 5 am/ mycophenolate 540 bid  - insulin degludec-liraglutide 100-3.6 qam sq  - dolutegravir 50 qd  - aspirin 81/ rosuvastatin 20 qd  - cinacalcet 60/ na bicarb tid  - pantoprazole 20  - prn's/ vitamins/ supplements   UA 10/25 > >300 prot, 0-5 rbc/ wbc  cXR 10/29 - diffuse bilat asymmetric infiltrates   Baseline creat 3.50- 4.36 from 06/2018 - 05/2019  Assessment/ Plan:  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  BUN/ creat up slightly up today, low UOP.  CCM discussing plan w/ family.  Will follow.    H/o renal transplant - in 2013. Getting IS meds, prograf lowered today by 50%.  Level pending.   COVID + PNA - per CCM  Hypotension - on levo gtt   Anemia CKD/ lab draws - Hb 6.4  this am, transfuse prn. Will consider esa.  HIV  Hep C     Rob Manasseh Pittsley 10/03/2019, 4:01 PM  Iron/TIBC/Ferritin/ %Sat    Component Value Date/Time   IRON 31 09/29/2019 0855   TIBC 126 (L) 09/29/2019 0855   FERRITIN 517 (H) 09/27/2019 0334   IRONPCTSAT 25 09/29/2019 0855   Recent Labs  Lab 10/03/19 0512 10/03/19 1355  NA 141 143  K 3.8  --   CL 111  --   CO2 18*  --   GLUCOSE 212*  --   BUN 74*  --   CREATININE 5.14*  --   CALCIUM 7.8*  --   PHOS 4.1  --   ALBUMIN 1.9*  --   INR 1.0  --    Recent Labs  Lab 10/03/19 0512  AST 18  ALT 14  ALKPHOS 74  BILITOT 0.9  PROT 4.7*   Recent Labs  Lab 10/03/19 0512  WBC 9.9  HGB 8.3*  HCT 24.9*  PLT 81*

## 2019-10-03 NOTE — Progress Notes (Signed)
OT Cancellation Note/Discharge  Patient Details Name: [name] MRN: [id number] DOB: February 18, 1946   Cancelled Treatment:    Reason Eval/Treat Not Completed: Other (comment). Pt with decline in medical status and is DNR. OT signing off at this time.   Hydie Langan,HILLARY 10/03/2019, 5:29 PM  Maurie Boettcher, OT/L   Acute OT Clinical Specialist Acute Rehabilitation Services Pager [phone] Office [phone]

## 2019-10-03 NOTE — Plan of Care (Signed)
Pt continues to waken up, requiring occasional dose of Fentanyl or versed for vent compliance.  Pt moving arms more, bringing hands to front of face with coughing.  Pt not always following commands.

## 2019-10-03 NOTE — Progress Notes (Signed)
PROGRESS NOTE    [name]  ZOX:[id number] DOB: Feb 08, 1946 DOA: 09/12/2019 PCP: Earlene Plater, MD   Brief Narrative:  73 year old BF PMHx  renal transplantation, chronic kidney disease stage IV, HIV on antiretrovirals, chronic hepatitis C, and DM 2   who was hospitalized at [facility] 10/9 > 10/16 for Covid gastroenteritis and pneumonitis complicated by acute kidney injury.  She was referred back to the ED 10/22 by her PCP with complaints of weakness and lethargy.  In the ED she was found to have worsening infiltrates on a CXR and was admitted to [facility].    Subjective: 10/30 intubated/minimally sedated patient withdraws to pain slightly when RIGHT arm raised/manipulated   Assessment & Plan:   Active Problems:   Encounter for intubation   COVID-19   Pneumonia due to COVID-19 virus   Altered mental status   Acute respiratory failure with hypoxemia (HCC)   Acute encephalopathy   Shock circulatory (HCC)   Protein-calorie malnutrition, severe   Cardiogenic shock ([place])  Pneumonitis/bilateral pulmonary infiltrates -Etiology unclear -BAL specimen; lymphocytic predominant -PCCM following  -Completed course remdesivir last hospitalization -Was reintubated emergently now back on vent, vent management and sedation per PCCM   COVID-19 Labs  Recent Labs    10/01/19 0914 10/01/19 2010 10/02/19 0501 10/02/19 0507 10/03/19 0512  DDIMER 1.77* 1.38* 1.56*  --  1.60*  LDH  --   --  295*  --   --   CRP 1.6*  --   --  1.2* 1.1*    Lab Results  Component Value Date   SARSCOV2NAA POSITIVE (A) 09/12/2019   [place] Not Detected 81/19/1478   Acute metabolic encephalopathy -MRI unrevealing -EEG without acute helpful findings -10/28 neurology completed LP will awaiting findings -29/56 acute metabolic encephalopathy -21/30 repeat head CT pending -10/29 have discontinued cefepime per neurology and other medications per pharmacy the may cause  myoclonus..   -10/29 tacrolimus level pending -10/30 patient' cognition still not improving although on very minimal sedation.  May be secondary to increasing renal failure vs Covid encephalopathy vs infectiion , or a combination.  Myoclonic jerks -See acute encephalopathy   Acute RIGHT upper extremity DVT -10/28 pharmacy restarted anticoagulation at prophylactic dose following LP.  Patient had significant drop in H/H on 10/27, will address whether to increase to therapeutic level in a.m. -10/29 hold heparin   ACUTE RIGHT upper extremity compartment syndrome? -Can no longer palpate radial/ulnar pulses in right upper extremity, although still dopplerable.  Area of DVT/bruising appears to be spreading, increased firmness pain to palpation -Arterial Doppler pending -Dependent upon findings of arterial Doppler patient may require transportation to [facility] to be evaluated by vascular surgery for surgical resolution  Acute bleed -Transfuse for hemoglobin<7 -Patient removed from Covid PACT trial secondary to significant drop in H/H -10/27 transfuse 1 unit PRBC -10/28 CT head negative for acute bleed -10/28 CT abdomen pelvis negative for acute bleed -Occult blood pending -Gastroccult pending -Haptoglobin/LDH pending -Anemia panel pending -10/27 HIT panel negative -DIC panel pending -10/29 transfuse 1 unit PRBC -10/29 hold heparin  Thrombocytopenia -No clear evidence of occult bleeding monitor closely -Continues to worsen most likely secondary to Covid illness, as all heparin products have been discontinued.  Cardiogenic shock -Patient continues to require norepinephrine to maintain adequate perfusion pressure. -Titrate norepinephrine to maintain MAP>65  Diabetes type 2 uncontrolled with complication -86/57 hemoglobin A1c= 7.7 -10/28 increase Lantus 15 units daily -10/28 increase NovoLog moderate  S/p renal transplant -Pharmacy managing CellCept and  tacrolimus dosing. -10/28  tacrolimus level = 6.0   Acute on CKD stage III/IV (baseline Cr 2.89-3.56) Recent Labs  Lab 10/01/19 0500 10/01/19 0914 10/01/19 2010 10/02/19 0501 10/03/19 0512  CREATININE 3.85* 4.38* 3.86* 4.93* 5.14*  -Patient with worsening renal function will consult nephrology in the a.m. most likely will require CRRT -Pharmacy to dose tacrolimus according to renal failure  Metabolic acidosis -Bicarb drip 16m/hr  Hypernatremia -Resolved  Hypokalemia -Resolved;   Low TSH  -Free T4 is mildly elevated, but at this point I would be very hesitant to start her on any medication  HIV -Continue usual outpatient medical therapy   HLD  GERD  Deconditioning/debility PT/OT consultations  Severe protein calorie malnutrition -Continue feeding per nutrition recommendations    DVT prophylaxis: Subcu heparin Code Status: Full Family Communication: 10/30 spoke with TLavella Lemons(daughter) spoke at length with daughter explained that overnight mother had worsening pain, so for that mother had multisystem organ failure and that her renal function was continuing to worsen as well as increasing thrombocytopenia most likely secondary to her severe/worsening illness.  Counseled that medical team had discussed case to include myself Dr. YNelda Marseille and Dr. SJonnie Finner and felt that starting CRRT/HD would not alter the course of her illness.  In other words Pt would not survive this hospitalization.  She requested to speak to another doctor on the team, after speaking to Dr. YNelda Marseilleit was decided to make patient DNR for today, so that her sister could try to fly into town.  If sister could not make it into town terminal wean would be started.   Disposition Plan: TBD   Consultants:  PCCM  Procedures/Significant Events:  [facility] transfer to ICU with altered mental status -intubated 10/24 MRI brain -no acute  findings 10/25 extubated 10/25 EEG -posterior cortical dysfunction of nonspecific etiology with evidence of moderate to severe diffuse encephalopathy 10/27 right upper extremity venous Doppler significant for DVT. 10/27 transferred back to ICU secondary to respiratory distress and encephalopathy required intubation 10/28 LP performed by neurology 10/29 transfuse 1 unit PRBC 10/29 RIGHT upper extremity arterial Doppler; negative obstruction.  Brachial DVT is still present.  I have personally reviewed and interpreted all radiology studies and my findings are as above.  VENTILATOR SETTINGS: Vent mode; CPAP Rate set;  Vt Set;  PEEP; 5  Pressure support; 10 FiO2; 40%     Cultures 10/23 MRSA by PCR negative 10/24 BAL acid-fast culture pending 10/24 BAL smear  10/24 BAL consistent with normal flora 10/28 LP fungus pending 10/28 LP CSF, NGTD     Antimicrobials: Anti-infectives (From admission, onward)   Start     Stop   10/01/19 2100  piperacillin-tazobactam (ZOSYN) IVPB 2.25 g         09/28/19 1000  lamiVUDine (EPIVIR) 10 MG/ML solution 100 mg         09/28/19 0600  vancomycin (VANCOCIN) IVPB 1000 mg/200 mL premix  Status:  Discontinued     09/28/19 1245   09/26/19 1000  dolutegravir (TIVICAY) tablet 50 mg         09/26/19 1000  lamiVUDine (EPIVIR) 10 MG/ML solution 100 mg  Status:  Discontinued     09/27/19 1431   09/26/19 0030  vancomycin (VANCOCIN) 1,250 mg in sodium chloride 0.9 % 250 mL IVPB     09/26/19 0404   09/26/19 0030  ceFEPIme (MAXIPIME) 2 g in  sodium chloride 0.9 % 100 mL IVPB  Status:  Discontinued     10/01/19 1323       Devices    LINES / TUBES:      Continuous Infusions:  sodium chloride     feeding supplement (VITAL AF 1.2 CAL) 60 mL/hr at 10/02/19 1400   norepinephrine (LEVOPHED) Adult infusion Stopped (10/03/19 0515)   piperacillin-tazobactam (ZOSYN)  IV 2.25 g (10/03/19 0202)    sodium bicarbonate infusion 1/4 NS 1000 mL 50 mL/hr  at 10/03/19 0600   sodium chloride Stopped (10/01/19 0127)     Objective: Vitals:   10/03/19 0750 10/03/19 0800 10/03/19 0815 10/03/19 0830  BP: (!) 156/54 (!) 89/68 (!) 111/45   Pulse: 78 84 91 84  Resp: (!) 24 (!) 22 (!) 32 (!) 23  Temp:      TempSrc:      SpO2: 100% 100% 100% 100%  Weight:      Height:        Intake/Output Summary (Last 24 hours) at 10/03/2019 0855 Last data filed at 10/03/2019 0600 Gross per 24 hour  Intake 3521.43 ml  Output 200 ml  Net 3321.43 ml   Filed Weights   10/01/19 0500 10/02/19 0500 10/03/19 0424  Weight: 72 kg 68.9 kg 73.8 kg    Physical Exam:  General: Intubated/minimally sedated withdraws to painful stimuli when RIGHT upper extremity manipulated, positive acute respiratory distress Eyes: negative scleral hemorrhage, negative anisocoria, negative icterus ENT: Negative Runny nose, negative gingival bleeding, #7.5 ETT in place Neck:  Negative scars, masses, torticollis, lymphadenopathy, JVD Lungs: Tachypneic clear to auscultation bilaterally without wheezes or crackles Cardiovascular: Regular rate and rhythm without murmur gallop or rub normal S1 and S2 Abdomen: negative abdominal pain, nondistended, positive soft, bowel sounds, no rebound, no ascites, no appreciable mass Extremities: RIGHT upper extremity swelling, hand cold to touch, ulnar/radial pulse Palpable old dopplerable.  Increased cyanosis.   Skin: Negative rashes, lesions, ulcers Psychiatric: Intubated/sedated Central nervous system: Intubated/sedated .     Data Reviewed: Care during the described time interval was provided by me .  I have reviewed this patient's available data, including medical history, events of note, physical examination, and all test results as part of my evaluation.   CBC: Recent Labs  Lab 09/30/19 0415 09/30/19 2048 10/01/19 0500  10/01/19 0914 10/01/19 2010 10/02/19 0323 10/02/19 0501 10/03/19 0256 10/03/19 0512  WBC 8.6 18.8* 16.8*  --   19.5*  --   --  16.2*  --  9.9  NEUTROABS 7.4  --  14.6*  --  15.7*  --   --  13.5*  --  8.1*  HGB 7.1* 4.8* 8.2*   < > 8.3*  --  5.8* 6.4* 7.8* 8.3*  HCT 22.6* 15.2* 25.0*   < > 24.9*  --  17.0* 20.0* 23.0* 24.9*  MCV 90.4 91.0 91.2  --  91.2  --   --  93.0  --  88.3  PLT 85* 110*   108* 127*  --  134* 96*  --  102*  --  81*   < > = values in this interval not displayed.   Basic Metabolic Panel: Recent Labs  Lab 09/29/19 0855  10/01/19 0500  10/01/19 0914 10/01/19 2010 10/02/19 0323 10/02/19 0501 10/02/19 1300 10/02/19 1810 10/03/19 0010 10/03/19 0256 10/03/19 0512  NA 144   < > 145   < > 144 143 143 140 141 142 143 142 141  K 3.3*   < > 5.2*   < >  5.1 4.0 4.5 4.3  --   --   --  3.9 3.8  CL 116*   < > 117*  --  118* 123*  --  115*  --   --   --   --  111  CO2 21*   < > 17*  --  17* 13*  --  17*  --   --   --   --  18*  GLUCOSE 120*   < > 279*  --  278* 329*  --  362*  --   --   --   --  212*  BUN 42*   < > 56*  --  62* 53*  --  68*  --   --   --   --  74*  CREATININE 2.87*   < > 3.85*  --  4.38* 3.86*  --  4.93*  --   --   --   --  5.14*  CALCIUM 8.9   < > 7.9*  --  8.0* 6.2*  --  7.7*  --   --   --   --  7.8*  MG 1.8  --  2.1  --  2.1  --   --  2.1  --   --   --   --  2.1  PHOS  --   --  4.4  --  4.4  --   --  4.7*  --   --   --   --  4.1   < > = values in this interval not displayed.   GFR: Estimated Creatinine Clearance: 9.8 mL/min (A) (by C-G formula based on SCr of 5.14 mg/dL (H)). Liver Function Tests: Recent Labs  Lab 09/30/19 0415 10/01/19 0500 10/01/19 0914 10/01/19 2010 10/02/19 0501 10/03/19 0512  AST 13* 26 22  --  16 18  ALT _0 --  15 14  ALKPHOS 53 58 54  --  66 74  BILITOT 0.6 0.4 0.5  --  0.4 0.9  PROT 5.2* 4.9* 4.8*  --  4.3* 4.7*  ALBUMIN 2.1* 2.0* 2.0* 1.5* 1.8* 1.9*   No results for input(s): LIPASE, AMYLASE in the last 168 hours. Recent Labs  Lab 09/27/19 0334 09/29/19 0855  AMMONIA 21 22   Coagulation Profile: Recent Labs  Lab  09/30/19 2048 10/01/19 0500 10/01/19 2010 10/02/19 0501 10/03/19 0512  INR 1.3* 1.1 1.3* 1.2 1.0   Cardiac Enzymes: No results for input(s): CKTOTAL, CKMB, CKMBINDEX, TROPONINI in the last 168 hours. BNP (last 3 results) No results for input(s): PROBNP in the last 8760 hours. HbA1C: No results for input(s): HGBA1C in the last 72 hours. CBG: Recent Labs  Lab 10/02/19 1052 10/02/19 1600 10/02/19 2022 10/03/19 0010 10/03/19 0354  GLUCAP 197* 238* 289* 175* 228*   Lipid Profile: No results for input(s): CHOL, HDL, LDLCALC, TRIG, CHOLHDL, LDLDIRECT in the last 72 hours. Thyroid Function Tests: No results for input(s): TSH, T4TOTAL, FREET4, T3FREE, THYROIDAB in the last 72 hours. Anemia Panel: No results for input(s): VITAMINB12, FOLATE, FERRITIN, TIBC, IRON, RETICCTPCT in the last 72 hours. Urine analysis:    Component Value Date/Time   COLORURINE YELLOW 09/28/2019 0100   APPEARANCEUR CLEAR 09/28/2019 0100   LABSPEC 1.015 09/28/2019 0100   PHURINE 5.0 09/28/2019 0100   GLUCOSEU >=500 (A) 09/28/2019 0100   HGBUR SMALL (A) 09/28/2019 0100   BILIRUBINUR NEGATIVE 09/28/2019 0100   KETONESUR 5 (A) 09/28/2019 0100   PROTEINUR >=300 (A) 09/28/2019 0100  UROBILINOGEN 0.2 03/02/2014 1441   NITRITE NEGATIVE 09/28/2019 0100   LEUKOCYTESUR NEGATIVE 09/28/2019 0100   Sepsis Labs: _0 (procalcitonin:4,lacticidven:4)  ) Recent Results (from the past 240 hour(s))  Respiratory Panel by PCR     Status: None   Collection Time: 09/26/19  7:56 AM   Specimen: Nasopharyngeal Swab; Respiratory  Result Value Ref Range Status   Adenovirus NOT DETECTED NOT DETECTED Final   Coronavirus 229E NOT DETECTED NOT DETECTED Final    Comment: (NOTE) The Coronavirus on the Respiratory Panel, DOES NOT test for the novel  Coronavirus (2019 nCoV)    Coronavirus HKU1 NOT DETECTED NOT DETECTED Final   Coronavirus NL63 NOT DETECTED NOT DETECTED Final   Coronavirus OC43 NOT DETECTED NOT DETECTED  Final   Metapneumovirus NOT DETECTED NOT DETECTED Final   Rhinovirus / Enterovirus NOT DETECTED NOT DETECTED Final   Influenza A NOT DETECTED NOT DETECTED Final   Influenza B NOT DETECTED NOT DETECTED Final   Parainfluenza Virus 1 NOT DETECTED NOT DETECTED Final   Parainfluenza Virus 2 NOT DETECTED NOT DETECTED Final   Parainfluenza Virus 3 NOT DETECTED NOT DETECTED Final   Parainfluenza Virus 4 NOT DETECTED NOT DETECTED Final   Respiratory Syncytial Virus NOT DETECTED NOT DETECTED Final   Bordetella pertussis NOT DETECTED NOT DETECTED Final   Chlamydophila pneumoniae NOT DETECTED NOT DETECTED Final   Mycoplasma pneumoniae NOT DETECTED NOT DETECTED Final    Comment: Performed at [facility], [place]. [address] [place], [place] [address]  MRSA PCR Screening     Status: None   Collection Time: 09/26/19 11:17 AM   Specimen: Nasal Mucosa; Nasopharyngeal  Result Value Ref Range Status   MRSA by PCR NEGATIVE NEGATIVE Final    Comment:        The GeneXpert MRSA Assay (FDA approved for NASAL specimens only), is one component of a comprehensive MRSA colonization surveillance program. It is not intended to diagnose MRSA infection nor to guide or monitor treatment for MRSA infections. Performed at [facility], [place] [address] [place], [place] [address]   Culture, respiratory (non-expectorated)     Status: None   Collection Time: 09/27/19 12:40 PM   Specimen: Bronchoalveolar Lavage; Respiratory  Result Value Ref Range Status   Specimen Description   Final    BRONCHIAL ALVEOLAR LAVAGE Performed at [place] [address] [place], [place] [address]    Special Requests   Final    Immunocompromised Performed at [facility], [place] [address], [place], [place] [address]    Gram Stain   Final    FEW WBC PRESENT,BOTH PMN AND MONONUCLEAR RARE GRAM NEGATIVE RODS    Culture   Final    Consistent with normal respiratory  flora. Performed at [facility], [place] [address], [place],  [address]    Report Status 09/30/2019 FINAL  Final  Fungus Culture With Stain     Status: None (Preliminary result)   Collection Time: 09/27/19 12:41 PM  Result Value Ref Range Status   Fungus Stain Final report  Final    Comment: (NOTE) Performed At: [facility] [place], [place] [id number] Rush Farmer MD [id number]    Fungus (Mycology) Culture PENDING  Incomplete  Aspergillus Ag, BAL/Serum     Status: None   Collection Time: 09/27/19 12:41 PM   Specimen: Bronchoalveolar Lavage; Respiratory  Result Value Ref Range Status   Aspergillus Ag, BAL/Serum 0.05 0.00 - 0.49 Index Final  Comment: (NOTE) Performed At: [facility] [place], [place] [id number] Rush Farmer MD [id number] Performed At: [facility] [address] [place], [place] [id number] Katina Degree MDPhD [id number]   Acid Fast Smear (AFB)     Status: None   Collection Time: 09/27/19 12:41 PM   Specimen: Bronchoalveolar Lavage; Respiratory  Result Value Ref Range Status   AFB Specimen Processing Concentration  Final   Acid Fast Smear Negative  Final    Comment: (NOTE) Performed At: [facility] [place], [place] [id number] Rush Farmer MD [id number]   Pneumocystis smear by DFA     Status: None   Collection Time: 09/27/19 12:41 PM   Specimen: Bronchoalveolar Lavage; Respiratory  Result Value Ref Range Status   Pneumocystis jiroveci Ag NEGATIVE  Final    Comment: Performed at [facility] Performed at [facility], [place] [address], [place], [place] [address]   Fungus Culture Result     Status: None   Collection Time: 09/27/19 12:41 PM  Result Value Ref Range Status   Result 1 Comment  Final    Comment: (NOTE) KOH/Calcofluor preparation:  no fungus observed. Performed At: [facility] [place], [place]  [id number] Rush Farmer MD [id number]   CSF culture with Stat gram stain     Status: None (Preliminary result)   Collection Time: 10/01/19  2:09 PM   Specimen: CSF; Cerebrospinal Fluid  Result Value Ref Range Status   Specimen Description   Final    CSF Performed at [place] [address], [place], [place] [address]    Special Requests   Final    NONE Performed at [facility], [place] [address] [place], [place] [address]    Gram Stain   Final    RARE WBC PRESENT,BOTH PMN AND MONONUCLEAR NO ORGANISMS SEEN CYTOSPIN SMEAR Gram Stain Report Called to,Read Back By and Verified With: L.SPARKS AT 1804 ON 10/01/19 BY N.THOMPSON Performed at [facility], [place] [address] [place], [place] [address]    Culture   Final    NO GROWTH < 12 HOURS Performed at [place] [address] [place], [place] [address]    Report Status PENDING  Incomplete         Radiology Studies: Ct Abdomen Pelvis Wo Contrast  Result Date: 10/01/2019 CLINICAL DATA:  Unexplained anemia. Unexplained hypotension. COVID-19. EXAM: CT ABDOMEN AND PELVIS WITHOUT CONTRAST TECHNIQUE: Multidetector CT imaging of the abdomen and pelvis was performed following the standard protocol without IV contrast. COMPARISON:  CT scan of the chest dated 09/26/2019 FINDINGS: Lower chest: There are small bilateral pleural effusions with patchy peripheral infiltrates in both lungs, unchanged. Heart is at the upper limits of normal in size. Aortic atherosclerosis. Hepatobiliary: No focal liver abnormality is seen. Status post cholecystectomy. No biliary dilatation. Pancreas: Unremarkable. No pancreatic ductal dilatation or surrounding inflammatory changes. Spleen: Normal in size without focal abnormality. Adrenals/Urinary Tract: Adrenal glands are normal. Bilateral severe renal atrophy consistent with the patient's history of renal failure. Renal transplant in the right  iliac fossa. No hydronephrosis. Bladder is normal. Stomach/Bowel: Stomach is within normal limits. Appendix appears normal. No evidence of bowel wall thickening, distention, or inflammatory changes. Vascular/Lymphatic: Extensive aortic atherosclerosis. No adenopathy. Reproductive: Status post hysterectomy. No adnexal masses. Other: No ascites, retroperitoneal hemorrhage, or other significant abnormality. Musculoskeletal: No acute or significant osseous findings. IMPRESSION: 1. No acute abnormalities of the abdomen or  pelvis. Specifically, no evidence of hemorrhage in the abdomen or pelvis. 2. Small bilateral pleural effusions with patchy peripheral infiltrates in both lungs, unchanged. 3. Aortic atherosclerosis. Aortic Atherosclerosis (ICD10-I70.0). Electronically Signed   By: Lorriane Shire M.D.   On: 10/01/2019 12:59   Ct Head Wo Contrast  Result Date: 10/01/2019 CLINICAL DATA:  Altered level of consciousness. EXAM: CT HEAD WITHOUT CONTRAST TECHNIQUE: Contiguous axial images were obtained from the base of the skull through the vertex without intravenous contrast. COMPARISON:  September 25, 2019. FINDINGS: Brain: No evidence of acute infarction, hemorrhage, hydrocephalus, extra-axial collection or mass lesion/mass effect. Vascular: No hyperdense vessel or unexpected calcification. Skull: Normal. Negative for fracture or focal lesion. Sinuses/Orbits: No acute finding. Other: None. IMPRESSION: Normal head CT. Electronically Signed   By: Marijo Conception M.D.   On: 10/01/2019 12:55   Dg Chest Port 1 View  Result Date: 10/03/2019 CLINICAL DATA:  Intubation. EXAM: PORTABLE CHEST 1 VIEW COMPARISON:  10/02/2019. FINDINGS: Endotracheal tube, feeding tube in stable position. Left subclavian line appears to be at the cavoatrial junction on today's exam. Heart size stable. Diffuse bilateral pulmonary infiltrates again noted. No interim change. No prominent pleural effusion. No pneumothorax. Surgical clips right upper  quadrant. IMPRESSION: 1. Endotracheal tube, feeding tube in stable position. Left subclavian line appears to be at the cavoatrial junction on today's exam. 2. Diffuse bilateral pulmonary infiltrates again noted. No interim change. Electronically Signed   By: Marcello Moores  Register   On: 10/03/2019 07:12   Dg Chest Port 1 View  Result Date: 10/02/2019 CLINICAL DATA:  Intubation, COVID-19 EXAM: PORTABLE CHEST 1 VIEW COMPARISON:  Portable exam 0549 hours compared to 10/01/2019 FINDINGS: Tip of endotracheal tube projects 12 mm above carina. Tip of feeding tube projects over duodenal bulb/pyloric region. Tip of LEFT subclavian line projects over RIGHT atrium, recommend withdrawal 2 cm to place tip at the cavoatrial junction. Stable heart size and mediastinal contours. Patchy airspace infiltrates consistent with pneumonia, minimally increased. No pleural effusion or pneumothorax. IMPRESSION: Slightly increased pulmonary infiltrates. Recommend withdrawal of LEFT subclavian line 2 cm as above. Electronically Signed   By: Lavonia Dana M.D.   On: 10/02/2019 08:42   Vas [place] Upper Extremity Arterial Duplex  Result Date: 10/02/2019 UPPER EXTREMITY DUPLEX STUDY Indication Swollen arm, difficulty finding pulses, rule out ischemia.  Other Factors: Covid positive, Brachial DVT found 09/30/19 Limitations: Ventilator, patient unable to hold arm in position. Comparison Study: No prior study on file for comparsion Performing Technologist: Sharion Dove RVS  Examination Guidelines: A complete evaluation includes B-mode imaging, spectral Doppler, color Doppler, and power Doppler as needed of all accessible portions of each vessel. Bilateral testing is considered an integral part of a complete examination. Limited examinations for reoccurring indications may be performed as noted.  Right Doppler Findings: +-----------+----------+---------+------+--------+  Site        PSV (cm/s) Waveform  Plaque Comments   +-----------+----------+---------+------+--------+  Subclavian  126        triphasic                  +-----------+----------+---------+------+--------+  Axillary    139        triphasic                  +-----------+----------+---------+------+--------+  Brachial    221        triphasic                  +-----------+----------+---------+------+--------+  Radial  168        triphasic                  +-----------+----------+---------+------+--------+  Ulnar       155        triphasic                  +-----------+----------+---------+------+--------+  Palmar Arch 166        triphasic                  +-----------+----------+---------+------+--------+  Summary:  Right: No obstruction visualized in the right upper extremity Right        brachial DVT found 09/30/19 is still present. *See table(s) above for measurements and observations. Electronically signed by Servando Snare MD on 10/02/2019 at 4:25:28 PM.    Final         Scheduled Meds:  aspirin  81 mg Per Tube Daily   calcitRIOL  0.25 mcg Per Tube Daily   chlorhexidine gluconate (MEDLINE KIT)  15 mL Mouth Rinse BID   Chlorhexidine Gluconate Cloth  6 each Topical Daily   cinacalcet  60 mg Oral Daily   dolutegravir  50 mg Oral Daily   insulin aspart  0-20 Units Subcutaneous Q4H   insulin aspart  10 Units Subcutaneous Q4H   insulin glargine  15 Units Subcutaneous QHS   lamiVUDine  100 mg Per Tube Daily   magnesium oxide  400 mg Per Tube Daily   mouth rinse  15 mL Mouth Rinse 10 times per day   methylPREDNISolone (SOLU-MEDROL) injection  30 mg Intravenous Daily   Mycophenolate 7630m (1269mml) = 30m49m750 mg Per Tube BID   pantoprazole sodium  40 mg Per Tube Daily   rosuvastatin  20 mg Per Tube Daily   sodium bicarbonate  650 mg Per Tube BID   sodium chloride flush  3 mL Intravenous Q12H   tacrolimus  1 mg Oral Daily   tacrolimus  1.5 mg Oral QHS   Continuous Infusions:  sodium chloride     feeding supplement (VITAL  AF 1.2 CAL) 60 mL/hr at 10/02/19 1400   norepinephrine (LEVOPHED) Adult infusion Stopped (10/03/19 0515)   piperacillin-tazobactam (ZOSYN)  IV 2.25 g (10/03/19 0202)    sodium bicarbonate infusion 1/4 NS 1000 mL 50 mL/hr at 10/03/19 0600   sodium chloride Stopped (10/01/19 0127)     LOS: 7 days   The patient is critically ill with multiple organ systems failure and requires high complexity decision making for assessment and support, frequent evaluation and titration of therapies, application of advanced monitoring technologies and extensive interpretation of multiple databases. Critical Care Time devoted to patient care services described in this note  Time spent: 40 minutes     Avayah Raffety, CURGeraldo DockerD [facility] Pager [phone] 7PM-7AM, please contact night-coverage [email or web address] Password [email or web address] 10/03/2019, 8:55 AM

## 2019-10-03 NOTE — Progress Notes (Signed)
Pt's daughter, Lavella Lemons, called for update.  Nurse updated on pt's condition currently - resting while on vent; receiving PRN Fentanyl for vent tolerance/pain.  Lavella Lemons had questions re: tomorrow - process to come and see pt.  Nurse stated to call after 8A, that they would probably work with ICU Charge nurse and [facility] to coordinate time for family to see pt.  She stated she understood and would call them back in AM.

## 2019-10-03 NOTE — Progress Notes (Addendum)
Inpatient Diabetes Program Recommendations  AACE/ADA: New Consensus Statement on Inpatient Glycemic Control   Target Ranges:  Prepandial:   less than 140 mg/dL      Peak postprandial:   less than 180 mg/dL (1-2 hours)      Critically ill patients:  140 - 180 mg/dL   Results for [name], [name] (MRN [id number]) as of 10/03/2019 11:39  Ref. Range 10/02/2019 07:40 10/02/2019 10:52 10/02/2019 16:00 10/02/2019 20:22 10/03/2019 00:10 10/03/2019 03:54 10/03/2019 09:14  Glucose-Capillary Latest Ref Range: 70 - 99 mg/dL 222 (H)  Novolog 7 units 197 (H)  Novolog 4 units 238 (H)  Novolog 17 units 289 (H)  Novolog 21 units  Lantus 15 units 175 (H)  Novolog 14 units 228 (H)  Novolog 17 units 169 (H)  Novolog 14 units   Review of Glycemic Control  Current orders for Inpatient glycemic control:Lantus 15 units QHS, Novolog 0-20 units Q4H, Novolog 10 units Q4H for tube feeding; Solumedrol 30 mg daily, Vital @ 60 ml/hr  Inpatient Diabetes Program Recommendations:  Insulin-Basal: If steroids are continued, please consider increasing Lantus to 15 units BID (to start now).  Thanks, Barnie Alderman, RN, MSN, CDE Diabetes Coordinator Inpatient Diabetes Program [phone] (Team Pager from 8am to 5pm)

## 2019-10-03 NOTE — Progress Notes (Signed)
NAME:  [name], MRN:  [id number], DOB:  11-09-1946, LOS: 7 ADMISSION DATE:  09/19/2019, CONSULTATION DATE:  10/23 REFERRING MD:  Sloan Leiter, CHIEF COMPLAINT:  Dyspnea   Brief History   73 y/o female admitted on 10/22 to [facility] for lethargy and pulmonary new pulmonary infiltrates that developed one week after she was treated for COVID pneumonia.  Past Medical History  DM2 PCP pneumonia 1998 HTN HIV Hep C GERD ESRD s/p renal transplant CHF > LV normal, moderate concentric LVH, LVEF 60-65%, left atrium moderately dilated  Significant Hospital Events   10/22 admission 10/24 moved to ICU for declining mental status, intubated, bronchoscopy performed 10/25 extubated  Consults:  Pulmonary   Procedures:  10/24 ETT > 10/25  Significant Diagnostic Tests:  10/23 CT chest images personally reviewed> patchy and nodular consolidation bilaterally predominantly in upper lobes and periphery but upper lobe predeominant bronchovascular distribution some patchy ggo in bases with pleural effusions bilaterally, ? Cavitary lesion RLL 10/22 CT head > NAICP 10/24 MRI Brain > no acute findings, motion artifact 10/25 EEG > suggestive of cortical dysfunction in posterior head region, non-specific etiology, moderate to severe diffuse encephalopathy, no seizures  Micro Data:  10/23 respiratory viral panel > negative 10/24 BAL bacterial > rare GNR on stain 10/24 BAL fungal > Pending 10/24 BAL AFB > negative 10/24 BAL aspergillus antigen > negligible 10/24 BAL PCP > negative 10/28 CSF negative  Antimicrobials:  10/22 vanc > 10/25 10/22 cefepime > 10/28 Zosyn 10/28>>>  Interim history/subjective:   UOP negligible Cr rising Dyssynchrony overnight  Objective   Blood pressure (!) 144/46, pulse 71, temperature 97.7 F (36.5 C), resp. rate (!) 23, height _0  (1.6 m), weight 73.8 kg, SpO2 100 %.  Intake/Output Summary (Last 24 hours) at 10/03/2019 5102 Last data filed at 10/03/2019 0600  Gross per 24 hour  Intake 3771.43 ml  Output 200 ml  Net 3571.43 ml   Filed Weights   10/01/19 0500 10/02/19 0500 10/03/19 0424  Weight: 72 kg 68.9 kg 73.8 kg   Examination:  General:  Acute on chronically ill appearing female, NAD HENT: [place]/AT, PERRL, EOM-I and MMM, ETT in place PULM: Coarse diffusely  CV: RRR, Nl S1/S2 and -M/R/G GI: Soft, NT, ND and +BS MSK: normal bulk and tone, RUE severe edema, palpable is not palpable but dopplerable Neuro: Poorly responsive, not following commands  10/24 BAL fungal culture still pending but aspergillus ag is 0.05, PCP negative, per labcor is to result today  I reviewed CXR myself, ETT is too low, infiltrate noted  Discussed with TRH-MD  Resolved Hospital Problem list     Assessment & Plan:  Acute encephalopathy, improved  most likely toxic metabolic, MRI brain negative, with myoclonus Neurology input appreciated Continue thiamine D/C fentanyl drip CSF cultures negative PRN fentanyl as needed but minimize use EEG with metabolic disorders only Frequent orientation D/Ced Cefepime and continue zosyn for neurotoxicity risk  Bilateral infiltrates in immunocompromised host who has recovered from COVID: unclear etiology, lymphocytic predominant, unlikely to represent sarcoidosis or hypersensitivity pneumonitis; suspect a post covid inflammatory response or a fungal element Reintubated emergently Full vent support due to fluid status as well as mental status CXR and ABG in AM Follow up BAL fungal, remains pending from labcor see above for results Continue abx for now  Renal transplant/CKD Continue tacrolimus and cellcept Check tacrolimus level today Monitor BMET and UOP Replace electrolytes as needed Bicarb drip Likely to start CRRT today  Hypotension: ?medication related Levophed and  IVF for now Follow CVP Stress dose steroids Hold diureses D/C coreg and place order for PRN metoprolol if reflex tachy occurs D/C amlodipine   Need for sedation: resolved D/C all sedation at this point  RUE edema: Arterial doppler, if negative then to [facility] for vascular evaluation Elevate arm Need anticoagulation but Hg loss is a concern, will check fecal occult blood  Spoke with family.  Refusing dialysis.  Progress to comfort care.  Orders are signed and held.  RN to release when family is ready per [facility] policy.  PCCM will continue to follow  Best practice:  Diet: NPO after midnight Pain/Anxiety/Delirium protocol (if indicated): n/a VAP protocol (if indicated): n/a DVT prophylaxis: sub q heparin GI prophylaxis: n/a Glucose control: per TRH Mobility: bed rest Code Status: full Family Communication: will clarify with TRH who will call family today Disposition: move to PCU  Labs   CBC: Recent Labs  Lab 09/30/19 0415 09/30/19 2048 10/01/19 0500  10/01/19 0914 10/01/19 2010 10/02/19 0323 10/02/19 0501 10/03/19 0256 10/03/19 0512  WBC 8.6 18.8* 16.8*  --  19.5*  --   --  16.2*  --  9.9  NEUTROABS 7.4  --  14.6*  --  15.7*  --   --  13.5*  --  8.1*  HGB 7.1* 4.8* 8.2*   < > 8.3*  --  5.8* 6.4* 7.8* 8.3*  HCT 22.6* 15.2* 25.0*   < > 24.9*  --  17.0* 20.0* 23.0* 24.9*  MCV 90.4 91.0 91.2  --  91.2  --   --  93.0  --  88.3  PLT 85* 110*  108* 127*  --  134* 96*  --  102*  --  81*   < > = values in this interval not displayed.    Basic Metabolic Panel: Recent Labs  Lab 09/29/19 0855  10/01/19 0500  10/01/19 0914 10/01/19 2010 10/02/19 0323 10/02/19 0501 10/02/19 1300 10/02/19 1810 10/03/19 0010 10/03/19 0256 10/03/19 0512  NA 144   < > 145   < > 144 143 143 140 141 142 143 142 141  K 3.3*   < > 5.2*   < > 5.1 4.0 4.5 4.3  --   --   --  3.9 3.8  CL 116*   < > 117*  --  118* 123*  --  115*  --   --   --   --  111  CO2 21*   < > 17*  --  17* 13*  --  17*  --   --   --   --  18*  GLUCOSE 120*   < > 279*  --  278* 329*  --  362*  --   --   --   --  212*  BUN 42*   < > 56*  --  62* 53*  --  68*  --   --    --   --  74*  CREATININE 2.87*   < > 3.85*  --  4.38* 3.86*  --  4.93*  --   --   --   --  5.14*  CALCIUM 8.9   < > 7.9*  --  8.0* 6.2*  --  7.7*  --   --   --   --  7.8*  MG 1.8  --  2.1  --  2.1  --   --  2.1  --   --   --   --  2.1  PHOS  --   --  4.4  --  4.4  --   --  4.7*  --   --   --   --  4.1   < > = values in this interval not displayed.   GFR: Estimated Creatinine Clearance: 9.8 mL/min (A) (by C-G formula based on SCr of 5.14 mg/dL (H)). Recent Labs  Lab 09/27/19 0500 09/28/19 0500 09/29/19 0855 09/30/19 0415  10/01/19 0500 10/01/19 0914 10/02/19 0501 10/03/19 0512  PROCALCITON 0.18 0.20 0.32 0.18  --   --   --   --   --   WBC  --  12.4* 7.4 8.6   < > 16.8* 19.5* 16.2* 9.9   < > = values in this interval not displayed.    Liver Function Tests: Recent Labs  Lab 09/30/19 0415 10/01/19 0500 10/01/19 0914 10/01/19 2010 10/02/19 0501 10/03/19 0512  AST 13* 26 22  --  16 18  ALT _0 --  15 14  ALKPHOS 53 58 54  --  66 74  BILITOT 0.6 0.4 0.5  --  0.4 0.9  PROT 5.2* 4.9* 4.8*  --  4.3* 4.7*  ALBUMIN 2.1* 2.0* 2.0* 1.5* 1.8* 1.9*   No results for input(s): LIPASE, AMYLASE in the last 168 hours. Recent Labs  Lab 09/27/19 0334 09/29/19 0855  AMMONIA 21 22    ABG    Component Value Date/Time   PHART 7.369 10/02/2019 0323   PCO2ART 26.9 (L) 10/02/2019 0323   PO2ART 121.0 (H) 10/02/2019 0323   HCO3 16.9 (L) 10/03/2019 0256   TCO2 18 (L) 10/03/2019 0256   ACIDBASEDEF 7.0 (H) 10/03/2019 0256   O2SAT 81.0 10/03/2019 0256     Coagulation Profile: Recent Labs  Lab 09/30/19 2048 10/01/19 0500 10/01/19 2010 10/02/19 0501 10/03/19 0512  INR 1.3* 1.1 1.3* 1.2 1.0    Cardiac Enzymes: No results for input(s): CKTOTAL, CKMB, CKMBINDEX, TROPONINI in the last 168 hours.  HbA1C: Hgb A1c MFr Bld  Date/Time Value Ref Range Status  09/27/2019 03:34 AM 7.7 (H) 4.8 - 5.6 % Final    Comment:    (NOTE) Pre diabetes:          5.7%-6.4% Diabetes:               >6.4% Glycemic control for   <7.0% adults with diabetes   06/17/2018 07:55 PM 6.9 (H) 4.8 - 5.6 % Final    Comment:    (NOTE) Pre diabetes:          5.7%-6.4% Diabetes:              >6.4% Glycemic control for   <7.0% adults with diabetes     CBG: Recent Labs  Lab 10/02/19 1052 10/02/19 1600 10/02/19 2022 10/03/19 0010 10/03/19 0354  GLUCAP 197* 238* 289* 175* 228*   The patient is critically ill with multiple organ systems failure and requires high complexity decision making for assessment and support, frequent evaluation and titration of therapies, application of advanced monitoring technologies and extensive interpretation of multiple databases.   Critical Care Time devoted to patient care services described in this note is  33  Minutes. This time reflects time of care of this signee Dr [name]. This critical care time does not reflect procedure time, or teaching time or supervisory time of PA/NP/Med student/Med Resident etc but could involve care discussion time.  Rush Farmer, M.D. [facility] Pulmonary/Critical Care Medicine.

## 2019-10-03 NOTE — Progress Notes (Signed)
Spoke with patients daughter Lavella Lemons vis phone after she spoke to Dr. Nelda Marseille about patients condition and prognosis. Lavella Lemons is wishing to move in the direction to withdrawal support and make patient comfort care only. Patients other daughter will no be able to be in town for a visit until tomorrow 10/31 in the am, Plan to maintain current care until tomorrow morning when family can visit then transition to comfort measures and extubation.

## 2019-10-03 NOTE — Progress Notes (Signed)
Pt lavaged with 10cc normal saline per CCM MD request. Pt suctioned for a small amount of thick white secretions. Pt tolerated well, RT will continue to monitor.

## 2019-10-04 DIAGNOSIS — N179 Acute kidney failure, unspecified: Secondary | ICD-10-CM

## 2019-10-04 DIAGNOSIS — U071 COVID-19: Secondary | ICD-10-CM | POA: Diagnosis not present

## 2019-10-04 DIAGNOSIS — R570 Cardiogenic shock: Secondary | ICD-10-CM | POA: Diagnosis not present

## 2019-10-04 DIAGNOSIS — J9601 Acute respiratory failure with hypoxia: Secondary | ICD-10-CM | POA: Diagnosis not present

## 2019-10-04 LAB — COMPREHENSIVE METABOLIC PANEL
ALT: 16 U/L (ref 0–44)
AST: 17 U/L (ref 15–41)
Albumin: 1.9 g/dL — ABNORMAL LOW (ref 3.5–5.0)
Alkaline Phosphatase: 76 U/L (ref 38–126)
Anion gap: 11 (ref 5–15)
BUN: 80 mg/dL — ABNORMAL HIGH (ref 8–23)
CO2: 20 mmol/L — ABNORMAL LOW (ref 22–32)
Calcium: 8.2 mg/dL — ABNORMAL LOW (ref 8.9–10.3)
Chloride: 112 mmol/L — ABNORMAL HIGH (ref 98–111)
Creatinine, Ser: 5.15 mg/dL — ABNORMAL HIGH (ref 0.44–1.00)
GFR calc Af Amer: 9 mL/min — ABNORMAL LOW (ref >60)
GFR calc non Af Amer: 8 mL/min — ABNORMAL LOW (ref >60)
Glucose, Bld: 227 mg/dL — ABNORMAL HIGH (ref 70–99)
Potassium: 3.9 mmol/L (ref 3.5–5.1)
Sodium: 143 mmol/L (ref 135–145)
Total Bilirubin: 0.6 mg/dL (ref 0.3–1.2)
Total Protein: 4.8 g/dL — ABNORMAL LOW (ref 6.5–8.1)

## 2019-10-04 LAB — C-REACTIVE PROTEIN: CRP: 1 mg/dL — ABNORMAL HIGH (ref <1.0)

## 2019-10-04 LAB — CBC WITH DIFFERENTIAL/PLATELET
Abs Immature Granulocytes: 0.1 K/uL — ABNORMAL HIGH (ref 0.00–0.07)
Basophils Absolute: 0 K/uL (ref 0.0–0.1)
Basophils Relative: 0 %
Eosinophils Absolute: 0 K/uL (ref 0.0–0.5)
Eosinophils Relative: 0 %
HCT: 25.9 % — ABNORMAL LOW (ref 36.0–46.0)
Hemoglobin: 8.3 g/dL — ABNORMAL LOW (ref 12.0–15.0)
Immature Granulocytes: 1 %
Lymphocytes Relative: 4 %
Lymphs Abs: 0.3 K/uL — ABNORMAL LOW (ref 0.7–4.0)
MCH: 28.8 pg (ref 26.0–34.0)
MCHC: 32 g/dL (ref 30.0–36.0)
MCV: 89.9 fL (ref 80.0–100.0)
Monocytes Absolute: 0.8 K/uL (ref 0.1–1.0)
Monocytes Relative: 10 %
Neutro Abs: 6.4 K/uL (ref 1.7–7.7)
Neutrophils Relative %: 85 %
Platelets: 79 K/uL — ABNORMAL LOW (ref 150–400)
RBC: 2.88 MIL/uL — ABNORMAL LOW (ref 3.87–5.11)
RDW: 15.9 % — ABNORMAL HIGH (ref 11.5–15.5)
WBC: 7.7 K/uL (ref 4.0–10.5)
nRBC: 0.4 % — ABNORMAL HIGH (ref 0.0–0.2)

## 2019-10-04 LAB — PROTIME-INR
INR: 1.1 (ref 0.8–1.2)
Prothrombin Time: 13.6 seconds (ref 11.4–15.2)

## 2019-10-04 LAB — MAGNESIUM: Magnesium: 2.1 mg/dL (ref 1.7–2.4)

## 2019-10-04 LAB — CSF CULTURE W GRAM STAIN: Culture: NO GROWTH

## 2019-10-04 LAB — GLUCOSE, CAPILLARY
Glucose-Capillary: 227 mg/dL — ABNORMAL HIGH (ref 70–99)
Glucose-Capillary: 167 mg/dL — ABNORMAL HIGH (ref 70–99)
Glucose-Capillary: 104 mg/dL — ABNORMAL HIGH (ref 70–99)

## 2019-10-04 LAB — D-DIMER, QUANTITATIVE: D-Dimer, Quant: 1.89 ug/mL-FEU — ABNORMAL HIGH (ref 0.00–0.50)

## 2019-10-04 LAB — CMV DNA BY PCR, QUALITATIVE: CMV DNA, Qual PCR: POSITIVE — AB

## 2019-10-04 LAB — PHOSPHORUS: Phosphorus: 4.5 mg/dL (ref 2.5–4.6)

## 2019-10-04 LAB — TACROLIMUS LEVEL: Tacrolimus (FK506) - LabCorp: 4.6 ng/mL (ref 2.0–20.0)

## 2019-10-04 MED ORDER — POLYVINYL ALCOHOL 1.4 % OP SOLN
1.0000 [drp] | Freq: Four times a day (QID) | OPHTHALMIC | Status: DC | PRN
  Filled 2019-10-04: qty 15

## 2019-10-04 MED ORDER — DIPHENHYDRAMINE HCL 50 MG/ML IJ SOLN: 25.0000 mg | INTRAMUSCULAR | Status: DC | PRN

## 2019-10-04 MED ORDER — GLYCOPYRROLATE 0.2 MG/ML IJ SOLN: 0.2000 mg | INTRAMUSCULAR | Status: DC | PRN

## 2019-10-04 MED ORDER — MORPHINE SULFATE (PF) 2 MG/ML IV SOLN: 2.0000 mg | INTRAVENOUS | Status: DC | PRN

## 2019-10-04 MED ORDER — ACETAMINOPHEN 325 MG PO TABS: 650.0000 mg | ORAL_TABLET | Freq: Four times a day (QID) | ORAL | Status: DC | PRN

## 2019-10-04 MED ORDER — GLYCOPYRROLATE 1 MG PO TABS
1.0000 mg | ORAL_TABLET | ORAL | Status: DC | PRN
  Filled 2019-10-04: qty 1

## 2019-10-04 MED ORDER — MORPHINE 100MG IN NS 100ML (1MG/ML) PREMIX INFUSION
0.0000 mg/h | INTRAVENOUS | Status: DC
  Administered 2019-10-04 – 2019-10-05 (×4): 5 mg/h via INTRAVENOUS
  Administered 2019-10-06: 15 mg/h via INTRAVENOUS
  Administered 2019-10-06: 18 mg/h via INTRAVENOUS
  Filled 2019-10-04 (×6): qty 100

## 2019-10-04 MED ORDER — DEXTROSE 5 % IV SOLN
INTRAVENOUS | Status: DC
  Administered 2019-10-04: 17:00:00 via INTRAVENOUS

## 2019-10-04 MED ORDER — ACETAMINOPHEN 650 MG RE SUPP: 650.0000 mg | Freq: Four times a day (QID) | RECTAL | Status: DC | PRN

## 2019-10-04 MED ORDER — MORPHINE BOLUS VIA INFUSION
5.0000 mg | INTRAVENOUS | Status: DC | PRN
  Filled 2019-10-04: qty 5

## 2019-10-04 NOTE — Progress Notes (Signed)
NAME:  [name], MRN:  [id number], DOB:  Oct 13, 1946, LOS: 8 ADMISSION DATE:  09/18/2019, CONSULTATION DATE:  10/23 REFERRING MD:  Sloan Leiter, CHIEF COMPLAINT:  Dyspnea   Brief History   73 y/o female admitted on 10/22 to [facility] for lethargy and pulmonary new pulmonary infiltrates that developed one week after she was treated for COVID pneumonia.  Past Medical History  DM2 PCP pneumonia 1998 HTN HIV Hep C GERD ESRD s/p renal transplant CHF > LV normal, moderate concentric LVH, LVEF 60-65%, left atrium moderately dilated  Significant Hospital Events   10/22 admission 10/24 moved to ICU for declining mental status, intubated, bronchoscopy performed 10/25 extubated  Consults:  Pulmonary   Procedures:  10/24 ETT > 10/25  Significant Diagnostic Tests:  10/23 CT chest images personally reviewed> patchy and nodular consolidation bilaterally predominantly in upper lobes and periphery but upper lobe predeominant bronchovascular distribution some patchy ggo in bases with pleural effusions bilaterally, ? Cavitary lesion RLL 10/22 CT head > NAICP 10/24 MRI Brain > no acute findings, motion artifact 10/25 EEG > suggestive of cortical dysfunction in posterior head region, non-specific etiology, moderate to severe diffuse encephalopathy, no seizures  Micro Data:  10/23 respiratory viral panel > negative 10/24 BAL bacterial > rare GNR on stain 10/24 BAL fungal > Pending 10/24 BAL AFB > negative 10/24 BAL aspergillus antigen > negligible 10/24 BAL PCP > negative 10/28 CSF negative  Antimicrobials:  10/22 vanc > 10/25 10/22 cefepime > 10/28 Zosyn 10/28>>>  Interim history/subjective:   UOP negligible Cr rising Dyssynchrony overnight  Objective   Blood pressure (!) 131/46, pulse 90, temperature 97.8 F (36.6 C), temperature source Axillary, resp. rate (!) 24, height _0  (1.6 m), weight 76.4 kg, SpO2 100 %.  Intake/Output Summary (Last 24 hours) at 10/04/2019 0826 Last data  filed at 10/04/2019 0700 Gross per 24 hour  Intake 2423.03 ml  Output 602 ml  Net 1821.03 ml   Filed Weights   10/02/19 0500 10/03/19 0424 10/04/19 0551  Weight: 68.9 kg 73.8 kg 76.4 kg   Examination:  General:  Acute on chronically ill appearing female, NAD HENT: [place]/AT, PERRL, EOM-I and MMM, ETT in place PULM: Coarse diffusely  CV: RRR, Nl S1/S2 and -M/R/G GI: Soft, NT, ND and +BS MSK: normal bulk and tone, RUE severe edema, palpable is not palpable but dopplerable Neuro: Poorly responsive, not following commands  10/24 BAL fungal culture still pending but aspergillus ag is 0.05, PCP negative, per labcor is to result today  I reviewed CXR myself, ETT is too low, infiltrate noted  Discussed with TRH-MD  Resolved Hospital Problem list     Assessment & Plan:  Acute encephalopathy, improved  most likely toxic metabolic, MRI brain negative, with myoclonus Neurology input appreciated Morphine for comfort at this point Family to arrive today for withdrawal  Bilateral infiltrates in immunocompromised host who has recovered from COVID: unclear etiology, lymphocytic predominant, unlikely to represent sarcoidosis or hypersensitivity pneumonitis; suspect a post covid inflammatory response or a fungal element Maintain on full support today for comfort  Renal transplant/CKD D/C further blood draws  Hypotension: ?medication related No further escalation Will d/c for withdrawal  Need for sedation: resolved Morphine for comfort at this point  Spoke with family, they are to arrange visitation prior to withdrawal  PCCM will sign off, please call back if needed.  Withdrawal orderset signed and held for RN to release when the family is ready.  Best practice:  Diet: NPO after midnight Pain/Anxiety/Delirium  protocol (if indicated): n/a VAP protocol (if indicated): n/a DVT prophylaxis: sub q heparin GI prophylaxis: n/a Glucose control: per TRH Mobility: bed rest Code Status: full  Family Communication: will clarify with TRH who will call family today Disposition: move to PCU  Labs   CBC: Recent Labs  Lab 10/01/19 0500  10/01/19 0914 10/01/19 2010 10/02/19 0323 10/02/19 0501 10/03/19 0256 10/03/19 0512 10/04/19 0447  WBC 16.8*  --  19.5*  --   --  16.2*  --  9.9 7.7  NEUTROABS 14.6*  --  15.7*  --   --  13.5*  --  8.1* 6.4  HGB 8.2*   < > 8.3*  --  5.8* 6.4* 7.8* 8.3* 8.3*  HCT 25.0*   < > 24.9*  --  17.0* 20.0* 23.0* 24.9* 25.9*  MCV 91.2  --  91.2  --   --  93.0  --  88.3 89.9  PLT 127*  --  134* 96*  --  102*  --  81* 79*   < > = values in this interval not displayed.    Basic Metabolic Panel: Recent Labs  Lab 10/01/19 0500  10/01/19 0914 10/01/19 2010 10/02/19 0323 10/02/19 0501  10/03/19 0010 10/03/19 0256 10/03/19 0512 10/03/19 1355 10/04/19 0447  NA 145   < > 144 143 143 140   < > 143 142 141 143 143  K 5.2*   < > 5.1 4.0 4.5 4.3  --   --  3.9 3.8  --  3.9  CL 117*  --  118* 123*  --  115*  --   --   --  111  --  112*  CO2 17*  --  17* 13*  --  17*  --   --   --  18*  --  20*  GLUCOSE 279*  --  278* 329*  --  362*  --   --   --  212*  --  227*  BUN 56*  --  62* 53*  --  68*  --   --   --  74*  --  80*  CREATININE 3.85*  --  4.38* 3.86*  --  4.93*  --   --   --  5.14*  --  5.15*  CALCIUM 7.9*  --  8.0* 6.2*  --  7.7*  --   --   --  7.8*  --  8.2*  MG 2.1  --  2.1  --   --  2.1  --   --   --  2.1  --  2.1  PHOS 4.4  --  4.4  --   --  4.7*  --   --   --  4.1  --  4.5   < > = values in this interval not displayed.   GFR: Estimated Creatinine Clearance: 9.9 mL/min (A) (by C-G formula based on SCr of 5.15 mg/dL (H)). Recent Labs  Lab 09/28/19 0500 09/29/19 0855 09/30/19 0415  10/01/19 0914 10/02/19 0501 10/03/19 0512 10/04/19 0447  PROCALCITON 0.20 0.32 0.18  --   --   --   --   --   WBC 12.4* 7.4 8.6   < > 19.5* 16.2* 9.9 7.7   < > = values in this interval not displayed.    Liver Function Tests: Recent Labs  Lab 10/01/19  0500 10/01/19 0914 10/01/19 2010 10/02/19 0501 10/03/19 0512 10/04/19 0447  AST 26 22  --  16 18 17  ALT 16 18  --  _0 ALKPHOS 58 54  --  66 74 76  BILITOT 0.4 0.5  --  0.4 0.9 0.6  PROT 4.9* 4.8*  --  4.3* 4.7* 4.8*  ALBUMIN 2.0* 2.0* 1.5* 1.8* 1.9* 1.9*   No results for input(s): LIPASE, AMYLASE in the last 168 hours. Recent Labs  Lab 09/29/19 0855  AMMONIA 22    ABG    Component Value Date/Time   PHART 7.369 10/02/2019 0323   PCO2ART 26.9 (L) 10/02/2019 0323   PO2ART 121.0 (H) 10/02/2019 0323   HCO3 16.9 (L) 10/03/2019 0256   TCO2 18 (L) 10/03/2019 0256   ACIDBASEDEF 7.0 (H) 10/03/2019 0256   O2SAT 81.0 10/03/2019 0256     Coagulation Profile: Recent Labs  Lab 10/01/19 0500 10/01/19 2010 10/02/19 0501 10/03/19 0512 10/04/19 0447  INR 1.1 1.3* 1.2 1.0 1.1    Cardiac Enzymes: No results for input(s): CKTOTAL, CKMB, CKMBINDEX, TROPONINI in the last 168 hours.  HbA1C: Hgb A1c MFr Bld  Date/Time Value Ref Range Status  09/27/2019 03:34 AM 7.7 (H) 4.8 - 5.6 % Final    Comment:    (NOTE) Pre diabetes:          5.7%-6.4% Diabetes:              >6.4% Glycemic control for   <7.0% adults with diabetes   06/17/2018 07:55 PM 6.9 (H) 4.8 - 5.6 % Final    Comment:    (NOTE) Pre diabetes:          5.7%-6.4% Diabetes:              >6.4% Glycemic control for   <7.0% adults with diabetes     CBG: Recent Labs  Lab 10/03/19 1323 10/03/19 1603 10/03/19 2046 10/04/19 0430 10/04/19 0759  GLUCAP 97 114* 270* 227* 167*   The patient is critically ill with multiple organ systems failure and requires high complexity decision making for assessment and support, frequent evaluation and titration of therapies, application of advanced monitoring technologies and extensive interpretation of multiple databases.   Critical Care Time devoted to patient care services described in this note is  31  Minutes. This time reflects time of care of this signee Dr [name]. This critical care time does not reflect procedure time, or teaching time or supervisory time of PA/NP/Med student/Med Resident etc but could involve care discussion time.  Rush Farmer, M.D. [facility] Pulmonary/Critical Care Medicine.

## 2019-10-04 NOTE — Progress Notes (Signed)
PROGRESS NOTE    [name]  CBJ:[id number] DOB: [birth year] DOA: 09/07/2019 PCP: Earlene Plater, MD   Brief Narrative:  73 year old BF PMHx  renal transplantation, chronic kidney disease stage IV, HIV on antiretrovirals, chronic hepatitis C, and DM 2 who was hospitalized at [facility] 10/9 > 10/16 for Covid gastroenteritis and pneumonitis complicated by acute kidney injury.  She was referred back to the ED 10/22 by her PCP with complaints of weakness and lethargy.  In the ED she was found to have worsening infiltrates on a CXR and was admitted to [facility]    Subjective: Patient noted to be intubated.  Partially sedated.  Able to follow certain commands.   Assessment & Plan:  Pneumonitis from COVID-19/bilateral pulmonary infiltrates -BAL specimen; lymphocytic predominant -Completed course remdesivir last hospitalization Patient had to be reintubated.  Pulmonology is following.   Noted to be on Zosyn  COVID-19 Labs  Recent Labs    10/02/19 0501 10/02/19 0507 10/03/19 0512 10/04/19 0447  DDIMER 1.56*  --  1.60* 1.89*  LDH 295*  --   --   --   CRP  --  1.2* 1.1* 1.0*    Lab Results  Component Value Date   SARSCOV2NAA POSITIVE (A) 09/12/2019   [place] Not Detected 16/06/3709    Acute metabolic encephalopathy Likely due to Covid encephalopathy.  Although etiology remains unclear.  No other reason has been found based on multiple tests. -MRI done on 10/23 did not reveal any acute findings. -EEG without acute helpful findings -Patient underwhelmed lumbar puncture on 10/28.  No WBCs were noted.  Glucose was 148.  RBCs were seen.  Is without any growth.  -Underwent repeat CT on 10/28 which was without any acute findings as well.   -Since no clear etiology for her encephalopathy was found neurology recommended discontinuing cefepime and other medications which may be causing myoclonus.   -Tacrolimus level was within the range on  10/28. -Patient's mentation appears to have improved slightly.  She is noted to be following certain commands today.    Myoclonic jerks -See acute encephalopathy  Acute RIGHT upper extremity DVT Patient was on IV heparin.  Due to significant drop in hemoglobin Heparin on hold currently.  ACUTE RIGHT upper extremity compartment syndrome? -Can no longer palpate radial/ulnar pulses in right upper extremity, although still dopplerable.  Area of DVT/bruising appears to be spreading, increased firmness pain to palpation -Arterial Doppler done on 10/29 did not reveal any obstruction in the right arm.  Anemia secondary to questionable blood loss  Unclear where the patient was bleeding.  No overt bleeding was noted.  Stool was positive for occult blood but no melanotic stools or hematochezia has been noted.  Patient was transfused 2 PRBC with improvement in hemoglobin.  Heparin is on hold.  Thrombocytopenia Reason for thrombocytopenia is not clear.  Heparin has been discontinued.  HIT panel was negative.   Cardiogenic shock -Patient continues to require norepinephrine to maintain adequate perfusion pressure. -Titrate norepinephrine to maintain MAP>65  Diabetes type 2 uncontrolled with complication [id number] 7.7 patient currently on Lantus insulin.  Elevated CBGs most likely due to steroids.  Continue SSI as well.    S/p renal transplant -Pharmacy managing CellCept and tacrolimus dosing. -10/28 tacrolimus level = 6.0   Acute on CKD stage III/IV/normal anion gap metabolic acidosis Baseline creatinine around 2.8-3.5.  Creatinine has been progressively getting worse.  Up to 5.15 today.  Discussed with nephrology and critical care medicine.  Patient not  a good candidate for hemodialysis or CRRT.  On a bicarbonate infusion.  Hypernatremia Resolved  Hypokalemia Resolved  Abnormal thyroid function tests TSH was noted to be low at 0.13.  Free T4 is mildly elevated at 1.21.  These changes could  be due to acute illness.  No indication to initiate specific treatments at this time.  HIV Continue usual outpatient medical therapy  HLD/GERD  Deconditioning/debility PT/OT consultations  Severe protein calorie malnutrition Continue feeding per nutrition recommendations  Goals of care Previous rounding MDs as well as critical care medicine has discussed the patient's acute illness with patient's daughter.  Worsening status was communicated to them yesterday.  Patient was made DNR.  The daughter is coming into town so that she could see her mother.  The plan is for one-way extubation after the family meeting has taken place.  DVT prophylaxis: SCDs Code Status: DNR Disposition Plan: TBD   Consultants:  PCCM  Procedures/Significant Events:  10/9 > 10/16 admit to [facility] w/ COVID gastroenteritis and PNA 10/22 re-admit to [facility] ED 10/24 transfer to ICU with altered mental status -intubated 10/24 MRI brain -no acute findings 10/25 extubated 10/25 EEG -posterior cortical dysfunction of nonspecific etiology with evidence of moderate to severe diffuse encephalopathy 10/27 right upper extremity venous Doppler significant for DVT. 10/27 transferred back to ICU secondary to respiratory distress and encephalopathy required intubation 10/28 LP performed by neurology 10/29 transfuse 1 unit PRBC 10/29 RIGHT upper extremity arterial Doppler; negative obstruction.  Brachial DVT is still present.  Cultures 10/23 MRSA by PCR negative 10/24 BAL acid-fast culture pending 10/24 BAL smear  10/24 BAL consistent with normal flora 10/28 LP fungus pending 10/28 LP CSF, NGTD     Antimicrobials: Anti-infectives (From admission, onward)   Start     Stop   10/01/19 2100  piperacillin-tazobactam (ZOSYN) IVPB 2.25 g         09/28/19 1000  lamiVUDine (EPIVIR) 10 MG/ML solution 100 mg         09/28/19 0600  vancomycin (VANCOCIN) IVPB 1000 mg/200 mL premix  Status:  Discontinued      09/28/19 1245   09/26/19 1000  dolutegravir (TIVICAY) tablet 50 mg         09/26/19 1000  lamiVUDine (EPIVIR) 10 MG/ML solution 100 mg  Status:  Discontinued     09/27/19 1431   09/26/19 0030  vancomycin (VANCOCIN) 1,250 mg in sodium chloride 0.9 % 250 mL IVPB     09/26/19 0404   09/26/19 0030  ceFEPIme (MAXIPIME) 2 g in sodium chloride 0.9 % 100 mL IVPB  Status:  Discontinued     10/01/19 1323       Objective: Vitals:   10/04/19 0551 10/04/19 0600 10/04/19 0700 10/04/19 0751  BP:  113/87 (!) 131/46   Pulse:  86 86 90  Resp:  (!) 21 18 (!) 24  Temp:    97.8 F (36.6 C)  TempSrc:    Axillary  SpO2:  100% 100% 100%  Weight: 76.4 kg     Height:        Intake/Output Summary (Last 24 hours) at 10/04/2019 0859 Last data filed at 10/04/2019 0700 Gross per 24 hour  Intake 2423.03 ml  Output 602 ml  Net 1821.03 ml   Filed Weights   10/02/19 0500 10/03/19 0424 10/04/19 0551  Weight: 68.9 kg 73.8 kg 76.4 kg    Physical Exam:  General appearance: Intubated.  Partially sedated.  Does follow certain commands. Resp: Coarse breath  sound bilaterally.  No wheezing rales or rhonchi. Cardio: S1-S2 is normal regular.  No S3-S4.  No rubs murmurs or bruit GI: Abdomen is soft.  Nontender nondistended.  Bowel sounds are present normal.  No masses organomegaly Extremities: Swollen upper extremities right more than left.  Bruising is noted over the right upper extremity.  Pulses not easily palpable.   Neurologic: Noted to be awake.  Follows certain commands.      Data Reviewed:   CBC: Recent Labs  Lab 10/01/19 0500  10/01/19 0914 10/01/19 2010 10/02/19 0323 10/02/19 0501 10/03/19 0256 10/03/19 0512 10/04/19 0447  WBC 16.8*  --  19.5*  --   --  16.2*  --  9.9 7.7  NEUTROABS 14.6*  --  15.7*  --   --  13.5*  --  8.1* 6.4  HGB 8.2*   < > 8.3*  --  5.8* 6.4* 7.8* 8.3* 8.3*  HCT 25.0*   < > 24.9*  --  17.0* 20.0* 23.0* 24.9* 25.9*  MCV 91.2  --  91.2  --   --  93.0  --  88.3  89.9  PLT 127*  --  134* 96*  --  102*  --  81* 79*   < > = values in this interval not displayed.   Basic Metabolic Panel: Recent Labs  Lab 10/01/19 0500  10/01/19 0914 10/01/19 2010 10/02/19 0323 10/02/19 0501  10/03/19 0010 10/03/19 0256 10/03/19 0512 10/03/19 1355 10/04/19 0447  NA 145   < > 144 143 143 140   < > 143 142 141 143 143  K 5.2*   < > 5.1 4.0 4.5 4.3  --   --  3.9 3.8  --  3.9  CL 117*  --  118* 123*  --  115*  --   --   --  111  --  112*  CO2 17*  --  17* 13*  --  17*  --   --   --  18*  --  20*  GLUCOSE 279*  --  278* 329*  --  362*  --   --   --  212*  --  227*  BUN 56*  --  62* 53*  --  68*  --   --   --  74*  --  80*  CREATININE 3.85*  --  4.38* 3.86*  --  4.93*  --   --   --  5.14*  --  5.15*  CALCIUM 7.9*  --  8.0* 6.2*  --  7.7*  --   --   --  7.8*  --  8.2*  MG 2.1  --  2.1  --   --  2.1  --   --   --  2.1  --  2.1  PHOS 4.4  --  4.4  --   --  4.7*  --   --   --  4.1  --  4.5   < > = values in this interval not displayed.   GFR: Estimated Creatinine Clearance: 9.9 mL/min (A) (by C-G formula based on SCr of 5.15 mg/dL (H)). Liver Function Tests: Recent Labs  Lab 10/01/19 0500 10/01/19 0914 10/01/19 2010 10/02/19 0501 10/03/19 0512 10/04/19 0447  AST 26 22  --  _0 ALT 16 18  --  _1 ALKPHOS 58 54  --  66 74 76  BILITOT 0.4 0.5  --  0.4 0.9 0.6  PROT 4.9* 4.8*  --  4.3* 4.7* 4.8*  ALBUMIN 2.0* 2.0* 1.5* 1.8* 1.9* 1.9*    Recent Labs  Lab 09/29/19 0855  AMMONIA 22   Coagulation Profile: Recent Labs  Lab 10/01/19 0500 10/01/19 2010 10/02/19 0501 10/03/19 0512 10/04/19 0447  INR 1.1 1.3* 1.2 1.0 1.1   CBG: Recent Labs  Lab 10/03/19 1323 10/03/19 1603 10/03/19 2046 10/04/19 0430 10/04/19 0759  GLUCAP 97 114* 270* 227* 167*    Recent Results (from the past 240 hour(s))  Respiratory Panel by PCR     Status: None   Collection Time: 09/26/19  7:56 AM   Specimen: Nasopharyngeal Swab; Respiratory  Result Value Ref  Range Status   Adenovirus NOT DETECTED NOT DETECTED Final   Coronavirus 229E NOT DETECTED NOT DETECTED Final    Comment: (NOTE) The Coronavirus on the Respiratory Panel, DOES NOT test for the novel  Coronavirus (2019 nCoV)    Coronavirus HKU1 NOT DETECTED NOT DETECTED Final   Coronavirus NL63 NOT DETECTED NOT DETECTED Final   Coronavirus OC43 NOT DETECTED NOT DETECTED Final   Metapneumovirus NOT DETECTED NOT DETECTED Final   Rhinovirus / Enterovirus NOT DETECTED NOT DETECTED Final   Influenza A NOT DETECTED NOT DETECTED Final   Influenza B NOT DETECTED NOT DETECTED Final   Parainfluenza Virus 1 NOT DETECTED NOT DETECTED Final   Parainfluenza Virus 2 NOT DETECTED NOT DETECTED Final   Parainfluenza Virus 3 NOT DETECTED NOT DETECTED Final   Parainfluenza Virus 4 NOT DETECTED NOT DETECTED Final   Respiratory Syncytial Virus NOT DETECTED NOT DETECTED Final   Bordetella pertussis NOT DETECTED NOT DETECTED Final   Chlamydophila pneumoniae NOT DETECTED NOT DETECTED Final   Mycoplasma pneumoniae NOT DETECTED NOT DETECTED Final    Comment: Performed at [facility], [place] [address], [place], [place] [address]  MRSA PCR Screening     Status: None   Collection Time: 09/26/19 11:17 AM   Specimen: Nasal Mucosa; Nasopharyngeal  Result Value Ref Range Status   MRSA by PCR NEGATIVE NEGATIVE Final    Comment:        The GeneXpert MRSA Assay (FDA approved for NASAL specimens only), is one component of a comprehensive MRSA colonization surveillance program. It is not intended to diagnose MRSA infection nor to guide or monitor treatment for MRSA infections. Performed at [facility], [place] [address], [place], [place] [address]   Culture, respiratory (non-expectorated)     Status: None   Collection Time: 09/27/19 12:40 PM   Specimen: Bronchoalveolar Lavage; Respiratory  Result Value Ref Range Status   Specimen Description   Final    BRONCHIAL ALVEOLAR LAVAGE Performed  at [place] [address], [place], [place] [address]    Special Requests   Final    Immunocompromised Performed at [facility], [place] [address], [place], [place] [address]    Gram Stain   Final    FEW WBC PRESENT,BOTH PMN AND MONONUCLEAR RARE GRAM NEGATIVE RODS    Culture   Final    Consistent with normal respiratory flora. Performed at [facility], [place] [address], [place], [place] [address]    Report Status 09/30/2019 FINAL  Final  Fungus Culture With Stain     Status: None (Preliminary result)   Collection Time: 09/27/19 12:41 PM  Result Value Ref Range Status   Fungus Stain Final report  Final    Comment: (NOTE) Performed At: [facility] [address] [place], [place] [id number] Rush Farmer  MD AY:[id number]    Fungus (Mycology) Culture PENDING  Incomplete  Aspergillus Ag, BAL/Serum     Status: None   Collection Time: 09/27/19 12:41 PM   Specimen: Bronchoalveolar Lavage; Respiratory  Result Value Ref Range Status   Aspergillus Ag, BAL/Serum 0.05 0.00 - 0.49 Index Final    Comment: (NOTE) Performed At: [facility] [place], [place] [id number] Rush Farmer MD RK:[id number] Performed At: [facility] [address] [place], [place] [id number] Katina Degree MDPhD VP:[id number]   Acid Fast Smear (AFB)     Status: None   Collection Time: 09/27/19 12:41 PM   Specimen: Bronchoalveolar Lavage; Respiratory  Result Value Ref Range Status   AFB Specimen Processing Concentration  Final   Acid Fast Smear Negative  Final    Comment: (NOTE) Performed At: [facility] [place], [place] [id number] Rush Farmer MD [id number]   Pneumocystis smear by DFA     Status: None   Collection Time: 09/27/19 12:41 PM   Specimen: Bronchoalveolar Lavage; Respiratory  Result Value Ref Range Status   Pneumocystis jiroveci Ag NEGATIVE  Final    Comment: Performed at [place] [facility] Performed at [facility], [place] [address], [place], [place] [address]   Fungus Culture Result     Status: None   Collection Time: 09/27/19 12:41 PM  Result Value Ref Range Status   Result 1 Comment  Final    Comment: (NOTE) KOH/Calcofluor preparation:  no fungus observed. Performed At: [facility] [place], [place] [id number] Rush Farmer MD CH:[id number]   CSF culture with Stat gram stain     Status: None (Preliminary result)   Collection Time: 10/01/19  2:09 PM   Specimen: CSF; Cerebrospinal Fluid  Result Value Ref Range Status   Specimen Description   Final    CSF Performed at [place] [address] [place], [place] [address]    Special Requests   Final    NONE Performed at [facility], [place] [address] [place], [place] [address]    Gram Stain   Final    RARE WBC PRESENT,BOTH PMN AND MONONUCLEAR NO ORGANISMS SEEN CYTOSPIN SMEAR Gram Stain Report Called to,Read Back By and Verified With: L.SPARKS AT 1804 ON 10/01/19 BY N.THOMPSON Performed at [facility], [place] [address] [place], [place] [address]    Culture   Final    NO GROWTH 2 DAYS Performed at [place] [address], [place],  [place] [address]    Report Status PENDING  Incomplete         Radiology Studies: Dg Chest Port 1 View  Result Date: 10/03/2019 CLINICAL DATA:  Intubation. EXAM: PORTABLE CHEST 1 VIEW COMPARISON:  10/02/2019. FINDINGS: Endotracheal tube, feeding tube in stable position. Left subclavian line appears to be at the cavoatrial junction on today's exam. Heart size stable. Diffuse bilateral pulmonary infiltrates again noted. No interim change. No prominent pleural effusion. No pneumothorax. Surgical clips right upper quadrant. IMPRESSION: 1. Endotracheal tube, feeding tube in stable position. Left subclavian line appears to be at the cavoatrial junction on today's exam.  2. Diffuse bilateral pulmonary infiltrates again noted. No interim change. Electronically Signed   By: Matthews   On: 10/03/2019 07:12   Vas [place] Upper Extremity Arterial Duplex  Result Date: 10/02/2019 UPPER EXTREMITY DUPLEX STUDY Indication Swollen arm, difficulty finding pulses, rule out ischemia.  Other Factors: Covid positive, Brachial DVT found 09/30/19  Limitations: Ventilator, patient unable to hold arm in position. Comparison Study: No prior study on file for comparsion Performing Technologist: Sharion Dove RVS  Examination Guidelines: A complete evaluation includes B-mode imaging, spectral Doppler, color Doppler, and power Doppler as needed of all accessible portions of each vessel. Bilateral testing is considered an integral part of a complete examination. Limited examinations for reoccurring indications may be performed as noted.  Right Doppler Findings: +-----------+----------+---------+------+--------+  Site        PSV (cm/s) Waveform  Plaque Comments  +-----------+----------+---------+------+--------+  Subclavian  126        triphasic                  +-----------+----------+---------+------+--------+  Axillary    139        triphasic                  +-----------+----------+---------+------+--------+  Brachial    221        triphasic                  +-----------+----------+---------+------+--------+  Radial      168        triphasic                  +-----------+----------+---------+------+--------+  Ulnar       155        triphasic                  +-----------+----------+---------+------+--------+  Palmar Arch 166        triphasic                  +-----------+----------+---------+------+--------+  Summary:  Right: No obstruction visualized in the right upper extremity Right        brachial DVT found 09/30/19 is still present. *See table(s) above for measurements and observations. Electronically signed by Servando Snare MD on 10/02/2019 at 4:25:28 PM.    Final         Scheduled  Meds:  aspirin  81 mg Per Tube Daily   calcitRIOL  0.25 mcg Per Tube Daily   chlorhexidine gluconate (MEDLINE KIT)  15 mL Mouth Rinse BID   Chlorhexidine Gluconate Cloth  6 each Topical Daily   cinacalcet  60 mg Oral Daily   dolutegravir  50 mg Oral Daily   insulin aspart  0-20 Units Subcutaneous Q4H   insulin aspart  10 Units Subcutaneous Q4H   insulin glargine  15 Units Subcutaneous QHS   lamiVUDine  100 mg Per Tube Daily   magnesium oxide  400 mg Per Tube Daily   mouth rinse  15 mL Mouth Rinse 10 times per day   methylPREDNISolone (SOLU-MEDROL) injection  30 mg Intravenous Daily   Mycophenolate 7715m (1274mml) = 15m53m750 mg Per Tube BID   pantoprazole sodium  40 mg Per Tube Daily   rosuvastatin  20 mg Per Tube Daily   sodium bicarbonate  650 mg Per Tube BID   sodium chloride flush  3 mL Intravenous Q12H   tacrolimus  1 mg Oral Daily   tacrolimus  1.5 mg Oral QHS   Continuous Infusions:  sodium chloride     feeding supplement (VITAL AF 1.2 CAL) 60 mL/hr at 10/03/19 1300   norepinephrine (LEVOPHED) Adult infusion Stopped (10/03/19 0515)   piperacillin-tazobactam (ZOSYN)  IV 2.25 g (10/04/19 0207)    sodium bicarbonate infusion 1/4 NS 1000 mL 50 mL/hr at 10/04/19 0700   sodium chloride Stopped (10/01/19 0127)  LOS: 8 days     Bonnielee Haff, MD [facility]   If 7PM-7AM, please contact night-coverage [email or web address] Password [email or web address] 10/04/2019, 8:59 AM

## 2019-10-04 NOTE — Progress Notes (Signed)
[facility] Progress Note  Subjective: creat 5.1  Vitals:   10/04/19 0700 10/04/19 0751 10/04/19 0855 10/04/19 1146  BP: (!) 131/46  (!) 143/47 (!) 137/42  Pulse: 86 90 88 75  Resp: 18 (!) 24 18 (!) 22  Temp:  97.8 F (36.6 C)    TempSrc:  Axillary    SpO2: 100% 100% 100% 100%  Weight:      Height:        Inpatient medications: . aspirin  81 mg Per Tube Daily  . calcitRIOL  0.25 mcg Per Tube Daily  . chlorhexidine gluconate (MEDLINE KIT)  15 mL Mouth Rinse BID  . Chlorhexidine Gluconate Cloth  6 each Topical Daily  . cinacalcet  60 mg Oral Daily  . dolutegravir  50 mg Oral Daily  . insulin aspart  0-20 Units Subcutaneous Q4H  . insulin aspart  10 Units Subcutaneous Q4H  . insulin glargine  15 Units Subcutaneous QHS  . lamiVUDine  100 mg Per Tube Daily  . magnesium oxide  400 mg Per Tube Daily  . mouth rinse  15 mL Mouth Rinse 10 times per day  . methylPREDNISolone (SOLU-MEDROL) injection  30 mg Intravenous Daily  . Mycophenolate 755m (1240mml) = 12m59m750 mg Per Tube BID  . pantoprazole sodium  40 mg Per Tube Daily  . rosuvastatin  20 mg Per Tube Daily  . sodium bicarbonate  650 mg Per Tube BID  . sodium chloride flush  3 mL Intravenous Q12H  . tacrolimus  1 mg Oral Daily  . tacrolimus  1.5 mg Oral QHS   . sodium chloride    . feeding supplement (VITAL AF 1.2 CAL) 60 mL/hr at 10/03/19 1300  . norepinephrine (LEVOPHED) Adult infusion Stopped (10/03/19 0515)  . piperacillin-tazobactam (ZOSYN)  IV 2.25 g (10/04/19 0944)  .  sodium bicarbonate infusion 1/4 NS 1000 mL 50 mL/hr at 10/04/19 0700  . sodium chloride Stopped (10/01/19 0127)   sodium chloride, ALPRAZolam, dextrose, fentaNYL (SUBLIMAZE) injection, guaiFENesin-dextromethorphan, loperamide HCl, metoprolol tartrate, midazolam, midazolam, sodium chloride flush    Exam: Gen sedate, on vent No rash, cyanosis or gangrene Sclera anicteric, throat w ETT  No jvd or bruits Chest clear bilat to  bases RRR no MRG Abd soft ntnd no mass or ascites +bs obese GU defer MS no joint effusions or deformity Ext no LE edema, RUE edema is 3+ w/ bruising Neuro is sedated on vent LUA AVF +bruit    Home meds:  - amlodipine 10/ carvedilol 25 bid/ furosemide 80 qd/ KDur 20   - tacrolimus 2 am+ 3 pm/ prednisone 5 am/ mycophenolate 540 bid  - insulin degludec-liraglutide 100-3.6 qam sq  - dolutegravir 50 qd  - aspirin 81/ rosuvastatin 20 qd  - cinacalcet 60/ na bicarb tid  - pantoprazole 20  - prn's/ vitamins/ supplements   UA 10/25 > >300 prot, 0-5 rbc/ wbc  cXR 10/29 - diffuse bilat asymmetric infiltrates   Baseline creat 3.50- 4.36 from 06/2018 - 05/2019  Assessment/ Plan:  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  BUN/ creat up slightly up today, low UOP.  CCM d/w family, they did not want aggressive Rx or dialysis, plan is for comfort care. No further suggestions, will sign off.   H/o renal transplant - in 2013. Getting IS meds, prograf lowered by 50%.   COVID + PNA - per CCM  Hypotension - on levo gtt   Anemia CKD/ lab draws - Hb 6.4 this  am, transfuse prn. Will consider esa.  HIV  Hep C   [name] 10/04/2019, 2:17 PM  Iron/TIBC/Ferritin/ %Sat    Component Value Date/Time   IRON 31 09/29/2019 0855   TIBC 126 (L) 09/29/2019 0855   FERRITIN 517 (H) 09/27/2019 0334   IRONPCTSAT 25 09/29/2019 0855   Recent Labs  Lab 10/04/19 0447  NA 143  K 3.9  CL 112*  CO2 20*  GLUCOSE 227*  BUN 80*  CREATININE 5.15*  CALCIUM 8.2*  PHOS 4.5  ALBUMIN 1.9*  INR 1.1   Recent Labs  Lab 10/04/19 0447  AST 17  ALT 16  ALKPHOS 76  BILITOT 0.6  PROT 4.8*   Recent Labs  Lab 10/04/19 0447  WBC 7.7  HGB 8.3*  HCT 25.9*  PLT 79*

## 2019-10-04 NOTE — Procedures (Signed)
Extubation Procedure Note  Patient Details:   Name: [name] DOB: 1946/06/11 MRN: [id number]   Airway Documentation:    Vent end date: 10/04/19 Vent end time: 1710   Evaluation  O2 sats: stable throughout Complications: No apparent complications Patient did tolerate procedure well. Bilateral Breath Sounds: Diminished   Yes   Pt extubated to comfort care per physician order and family request. Pt suctioned via ETT and orally prior. Pt with fair cough, able to speak name and no stridor. Pt remains on RA and RT will continue to monitor.   Sharla Kidney 10/04/2019, 5:34 PM

## 2019-10-04 NOTE — Progress Notes (Signed)
Pt transported with RN from ICU 204-1 to the family comfort care room and back on the ventilator. Pt tolerated well and no complications.

## 2019-10-04 NOTE — Progress Notes (Signed)
Assisted tele visit to patient with family member.  Duwane Gewirtz Ann, RN  

## 2019-10-04 NOTE — Progress Notes (Signed)
Patient was extubated to comfort care this afternoon.  Cleaning up chart orders at RN request: [place] all lab draws, needle sticks, PO medications, etc.

## 2019-10-05 LAB — GLUCOSE, CAPILLARY: Glucose-Capillary: 166 mg/dL — ABNORMAL HIGH (ref 70–99)

## 2019-10-05 NOTE — Progress Notes (Signed)
PROGRESS NOTE    [name]  JYN:[id number] DOB: [birth year] DOA: 09/30/2019 PCP: Earlene Plater, MD   Brief Narrative:  73 year old BF PMHx  renal transplantation, chronic kidney disease stage IV, HIV on antiretrovirals, chronic hepatitis C, and DM 2 who was hospitalized at [facility] 10/9 > 10/16 for Covid gastroenteritis and pneumonitis complicated by acute kidney injury.  She was referred back to the ED 10/22 by her PCP with complaints of weakness and lethargy.  In the ED she was found to have worsening infiltrates on a CXR and was admitted to [facility].  She developed numerous acute issues as discussed below.  Poor prognosis discussed with family.  Transition to comfort care.  She was extubated.    Subjective: Patient responds when I call her name.  She opens her eyes but then goes right back to sleep.   Assessment & Plan:  Pneumonitis from COVID-19/bilateral pulmonary infiltrates BAL specimen; lymphocytic predominant. Completed course remdesivir last hospitalization. Patient had to be reintubated.  Patient placed on Zosyn.  Pulmonology was following.  Patient continued to decline.  Discussions held with family.  Transition to comfort care.  Extubated yesterday.  Patient seems to be comfortable.  She is on a morphine infusion currently.  COVID-19 Labs  Recent Labs    10/03/19 0512 10/04/19 0447  DDIMER 1.60* 1.89*  CRP 1.1* 1.0*    Lab Results  Component Value Date   SARSCOV2NAA POSITIVE (A) 09/12/2019   [place] Not Detected 86/57/8469    Acute metabolic encephalopathy Likely due to Covid encephalopathy.  Although etiology remains unclear.  No other reason has been found based on multiple tests. -MRI done on 10/23 did not reveal any acute findings. -EEG without acute helpful findings -Patient underwhelmed lumbar puncture on 10/28.  No WBCs were noted.  Glucose was 148.  RBCs were seen.  Is without any growth.  -Underwent repeat CT on  10/28 which was without any acute findings as well.   -Since no clear etiology for her encephalopathy was found neurology recommended discontinuing cefepime and other medications which may be causing myoclonus.   -Tacrolimus level was within the range on 10/28.   Myoclonic jerks None noted currently.  See above as well.  Acute RIGHT upper extremity DVT Patient was on IV heparin.  Due to significant drop in hemoglobin Heparin had to be placed on hold.  Now she is comfort care.  ACUTE RIGHT upper extremity compartment syndrome? -Unable to palpate radial/ulnar pulses in right upper extremity, although still dopplerable.  Area of DVT/bruising appears to be spreading, increased firmness pain to palpation -Arterial Doppler done on 10/29 did not reveal any obstruction in the right arm.  Anemia secondary to questionable blood loss  Unclear where the patient was bleeding.  No overt bleeding was noted.  Stool was positive for occult blood but no melanotic stools or hematochezia has been noted.  Patient was transfused 2 PRBC with improvement in hemoglobin.    Thrombocytopenia Reason for thrombocytopenia is not clear.  Heparin was discontinued.  HIT panel was negative.   Cardiogenic shock -Patient continues to require norepinephrine to maintain adequate perfusion pressure. -Titrate norepinephrine to maintain MAP>65  Diabetes type 2 uncontrolled with complication GEX5M 7.7 patient currently on Lantus insulin.  Elevated CBGs most likely due to steroids.  Continue SSI as well.    S/p renal transplant -Pharmacy was managing CellCept and tacrolimus dosing.  Now comfort care. -10/28 tacrolimus level = 6.0   Acute on CKD stage III/IV/normal  anion gap metabolic acidosis/hypernatremia/hypokalemia Baseline creatinine around 2.8-3.5.  Creatinine has been progressively getting worse.  Up to 5.15 today.  Discussed with nephrology and critical care medicine.  Patient not a good candidate for hemodialysis or  CRRT.  She was on a bicarbonate infusion.  Now comfort care.  Abnormal thyroid function tests TSH was noted to be low at 0.13.  Free T4 is mildly elevated at 1.21.  These changes could be due to acute illness.  No indication to initiate specific treatments at this time.  HIV Continue usual outpatient medical therapy  HLD/GERD  Deconditioning/debility PT/OT consultations  Severe protein calorie malnutrition Continue feeding per nutrition recommendations  Goals of care Previous rounding MDs as well as critical care medicine has discussed the patient's acute illness with patient's daughter.  Worsening status was communicated to them.  Patient was made DNR.  Patient visited by her daughter yesterday.  Subsequently patient was extubated and transition to comfort care.    DVT prophylaxis: Comfort care Code Status: DNR Disposition Plan: Transfer to [place] bed   Consultants:  PCCM  Procedures/Significant Events:  10/9 > 10/16 admit to [facility] w/ COVID gastroenteritis and PNA 10/22 re-admit to [facility] via [facility] ED 10/24 transfer to ICU with altered mental status -intubated 10/24 MRI brain -no acute findings 10/25 extubated 10/25 EEG -posterior cortical dysfunction of nonspecific etiology with evidence of moderate to severe diffuse encephalopathy 10/27 right upper extremity venous Doppler significant for DVT. 10/27 transferred back to ICU secondary to respiratory distress and encephalopathy required intubation 10/28 LP performed by neurology 10/29 transfuse 1 unit PRBC 10/29 RIGHT upper extremity arterial Doppler; negative obstruction.  Brachial DVT is still present.  Cultures 10/23 MRSA by PCR negative 10/24 BAL acid-fast culture pending 10/24 BAL smear  10/24 BAL consistent with normal flora 10/28 LP fungus pending 10/28 LP CSF, NGTD     Antimicrobials: Anti-infectives (From admission, onward)   Start     Stop   10/01/19 2100  piperacillin-tazobactam (ZOSYN) IVPB  2.25 g         09/28/19 1000  lamiVUDine (EPIVIR) 10 MG/ML solution 100 mg         09/28/19 0600  vancomycin (VANCOCIN) IVPB 1000 mg/200 mL premix  Status:  Discontinued     09/28/19 1245   09/26/19 1000  dolutegravir (TIVICAY) tablet 50 mg         09/26/19 1000  lamiVUDine (EPIVIR) 10 MG/ML solution 100 mg  Status:  Discontinued     09/27/19 1431   09/26/19 0030  vancomycin (VANCOCIN) 1,250 mg in sodium chloride 0.9 % 250 mL IVPB     09/26/19 0404   09/26/19 0030  ceFEPIme (MAXIPIME) 2 g in sodium chloride 0.9 % 100 mL IVPB  Status:  Discontinued     10/01/19 1323       Objective: Vitals:   10/04/19 1800 10/04/19 1900 10/04/19 2030 10/05/19 0418  BP:   (!) 104/38 (!) 133/53  Pulse: 62 (!) 59 61 75  Resp: _0 Temp:   (!) 96.9 F (36.1 C) (!) 96.4 F (35.8 C)  TempSrc:   Axillary Axillary  SpO2: 91% 92% 93% (!) 89%  Weight:      Height:        Intake/Output Summary (Last 24 hours) at 10/05/2019 0819 Last data filed at 10/05/2019 0046 Gross per 24 hour  Intake 1628.49 ml  Output 300 ml  Net 1328.49 ml   Filed Weights   10/02/19 0500 10/03/19 0424  10/04/19 0551  Weight: 68.9 kg 73.8 kg 76.4 kg    Physical Exam:  General appearance: She is barely responsive Resp: Shallow respirations.  Coarse breath sounds.  Crackles at the bases. Cardio: S1-S2 is normal regular.  No S3-S4.  No rubs murmurs or bruit GI: Abdomen is soft.  Nontender nondistended.  Bowel sounds are present normal.  No masses organomegaly    Data Reviewed:   CBC: Recent Labs  Lab 10/01/19 0500  10/01/19 0914 10/01/19 2010 10/02/19 0323 10/02/19 0501 10/03/19 0256 10/03/19 0512 10/04/19 0447  WBC 16.8*  --  19.5*  --   --  16.2*  --  9.9 7.7  NEUTROABS 14.6*  --  15.7*  --   --  13.5*  --  8.1* 6.4  HGB 8.2*   < > 8.3*  --  5.8* 6.4* 7.8* 8.3* 8.3*  HCT 25.0*   < > 24.9*  --  17.0* 20.0* 23.0* 24.9* 25.9*  MCV 91.2  --  91.2  --   --  93.0  --  88.3 89.9  PLT 127*  --  134* 96*   --  102*  --  81* 79*   < > = values in this interval not displayed.   Basic Metabolic Panel: Recent Labs  Lab 10/01/19 0500  10/01/19 0914 10/01/19 2010 10/02/19 0323 10/02/19 0501  10/03/19 0010 10/03/19 0256 10/03/19 0512 10/03/19 1355 10/04/19 0447  NA 145   < > 144 143 143 140   < > 143 142 141 143 143  K 5.2*   < > 5.1 4.0 4.5 4.3  --   --  3.9 3.8  --  3.9  CL 117*  --  118* 123*  --  115*  --   --   --  111  --  112*  CO2 17*  --  17* 13*  --  17*  --   --   --  18*  --  20*  GLUCOSE 279*  --  278* 329*  --  362*  --   --   --  212*  --  227*  BUN 56*  --  62* 53*  --  68*  --   --   --  74*  --  80*  CREATININE 3.85*  --  4.38* 3.86*  --  4.93*  --   --   --  5.14*  --  5.15*  CALCIUM 7.9*  --  8.0* 6.2*  --  7.7*  --   --   --  7.8*  --  8.2*  MG 2.1  --  2.1  --   --  2.1  --   --   --  2.1  --  2.1  PHOS 4.4  --  4.4  --   --  4.7*  --   --   --  4.1  --  4.5   < > = values in this interval not displayed.   GFR: Estimated Creatinine Clearance: 9.9 mL/min (A) (by C-G formula based on SCr of 5.15 mg/dL (H)). Liver Function Tests: Recent Labs  Lab 10/01/19 0500 10/01/19 0914 10/01/19 2010 10/02/19 0501 10/03/19 0512 10/04/19 0447  AST 26 22  --  _0 ALT 16 18  --  _1 ALKPHOS 58 54  --  66 74 76  BILITOT 0.4 0.5  --  0.4 0.9 0.6  PROT 4.9* 4.8*  --  4.3* 4.7* 4.8*  ALBUMIN 2.0*  2.0* 1.5* 1.8* 1.9* 1.9*    Recent Labs  Lab 09/29/19 0855  AMMONIA 22   Coagulation Profile: Recent Labs  Lab 10/01/19 0500 10/01/19 2010 10/02/19 0501 10/03/19 0512 10/04/19 0447  INR 1.1 1.3* 1.2 1.0 1.1   CBG: Recent Labs  Lab 10/03/19 2046 10/04/19 0430 10/04/19 0759 10/04/19 1357 10/05/19 0727  GLUCAP 270* 227* 167* 104* 166*    Recent Results (from the past 240 hour(s))  Respiratory Panel by PCR     Status: None   Collection Time: 09/26/19  7:56 AM   Specimen: Nasopharyngeal Swab; Respiratory  Result Value Ref Range Status   Adenovirus  NOT DETECTED NOT DETECTED Final   Coronavirus 229E NOT DETECTED NOT DETECTED Final    Comment: (NOTE) The Coronavirus on the Respiratory Panel, DOES NOT test for the novel  Coronavirus (2019 nCoV)    Coronavirus HKU1 NOT DETECTED NOT DETECTED Final   Coronavirus NL63 NOT DETECTED NOT DETECTED Final   Coronavirus OC43 NOT DETECTED NOT DETECTED Final   Metapneumovirus NOT DETECTED NOT DETECTED Final   Rhinovirus / Enterovirus NOT DETECTED NOT DETECTED Final   Influenza A NOT DETECTED NOT DETECTED Final   Influenza B NOT DETECTED NOT DETECTED Final   Parainfluenza Virus 1 NOT DETECTED NOT DETECTED Final   Parainfluenza Virus 2 NOT DETECTED NOT DETECTED Final   Parainfluenza Virus 3 NOT DETECTED NOT DETECTED Final   Parainfluenza Virus 4 NOT DETECTED NOT DETECTED Final   Respiratory Syncytial Virus NOT DETECTED NOT DETECTED Final   Bordetella pertussis NOT DETECTED NOT DETECTED Final   Chlamydophila pneumoniae NOT DETECTED NOT DETECTED Final   Mycoplasma pneumoniae NOT DETECTED NOT DETECTED Final    Comment: Performed at [facility], [place]. [address] [place], [place] [address]  MRSA PCR Screening     Status: None   Collection Time: 09/26/19 11:17 AM   Specimen: Nasal Mucosa; Nasopharyngeal  Result Value Ref Range Status   MRSA by PCR NEGATIVE NEGATIVE Final    Comment:        The GeneXpert MRSA Assay (FDA approved for NASAL specimens only), is one component of a comprehensive MRSA colonization surveillance program. It is not intended to diagnose MRSA infection nor to guide or monitor treatment for MRSA infections. Performed at [facility], [place] [address], [place], [place] [address]   Culture, respiratory (non-expectorated)     Status: None   Collection Time: 09/27/19 12:40 PM   Specimen: Bronchoalveolar Lavage; Respiratory  Result Value Ref Range Status   Specimen Description   Final    BRONCHIAL ALVEOLAR LAVAGE Performed at [place] [address], [place], [place] [address]    Special Requests   Final    Immunocompromised Performed at [facility], [place] [address], [place], [place] [address]    Gram Stain   Final    FEW WBC PRESENT,BOTH PMN AND MONONUCLEAR RARE GRAM NEGATIVE RODS    Culture   Final    Consistent with normal respiratory flora. Performed at [facility], [place]  [address] [place],  [address]    Report Status 09/30/2019 FINAL  Final  Fungus Culture With Stain     Status: None (Preliminary result)   Collection Time: 09/27/19 12:41 PM  Result Value Ref Range Status   Fungus Stain Final report  Final    Comment: (NOTE) Performed At: [facility] [place], [place] [id number] Rush Farmer MD ZC:[id number]    Fungus (  Mycology) Culture PENDING  Incomplete  Aspergillus Ag, BAL/Serum     Status: None   Collection Time: 09/27/19 12:41 PM   Specimen: Bronchoalveolar Lavage; Respiratory  Result Value Ref Range Status   Aspergillus Ag, BAL/Serum 0.05 0.00 - 0.49 Index Final    Comment: (NOTE) Performed At: [facility] [place], [place] [id number] Rush Farmer MD [id number] Performed At: [facility] [address] [place], [place] [id number] Katina Degree MDPhD [id number]   Acid Fast Smear (AFB)     Status: None   Collection Time: 09/27/19 12:41 PM   Specimen: Bronchoalveolar Lavage; Respiratory  Result Value Ref Range Status   AFB Specimen Processing Concentration  Final   Acid Fast Smear Negative  Final    Comment: (NOTE) Performed At: [facility] [place], [place] [id number] Rush Farmer MD [id number]   Pneumocystis smear by DFA     Status: None   Collection Time: 09/27/19 12:41 PM   Specimen: Bronchoalveolar Lavage; Respiratory  Result Value Ref Range Status   Pneumocystis jiroveci Ag NEGATIVE  Final    Comment: Performed at [facility] Performed at [facility], [place] [address] [place], [place] [address]   Fungus Culture Result     Status: None   Collection Time: 09/27/19 12:41 PM  Result Value Ref Range Status   Result 1 Comment  Final    Comment: (NOTE) KOH/Calcofluor preparation:  no fungus observed. Performed At: [facility] [place], [place] [id number] Rush Farmer MD [id number]   Fungus Culture With Stain     Status: None (Preliminary result)   Collection Time: 10/01/19  2:08 PM   Specimen: Lumbar Puncture  Result Value Ref Range Status   Fungus Stain Final report  Final    Comment: (NOTE) Performed At: [facility] [place], [place] [id number] Rush Farmer MD SJ:[id number]    Fungus (Mycology) Culture PENDING  Incomplete   Fungal Source CSF  Final    Comment: Performed at [facility], [place] [address], [place], [place] [address]  Fungus Culture Result     Status: None   Collection Time: 10/01/19  2:08 PM  Result Value Ref Range Status   Result 1 Comment  Final    Comment: (NOTE) KOH/Calcofluor preparation:  no fungus observed. Performed At: [facility] [place], [place] [id number] Rush Farmer MD [id number]   CSF culture with Stat gram stain     Status: None   Collection Time: 10/01/19  2:09 PM   Specimen: CSF; Cerebrospinal Fluid  Result Value Ref Range Status   Specimen Description   Final    CSF Performed at [place] [address] [place], [place] [address]    Special Requests   Final    NONE Performed at [facility], [place] [address] [place], [place] [address]    Gram Stain   Final    RARE WBC PRESENT,BOTH PMN AND MONONUCLEAR NO ORGANISMS SEEN CYTOSPIN SMEAR Gram Stain Report Called to,Read Back By and Verified With: L.SPARKS AT 1804 ON 10/01/19 BY N.THOMPSON Performed at [facility], [place] [address] [place], [place] [address]     Culture   Final    NO GROWTH Performed at [facility], [place] [address] [place], [place] [address]    Report Status 10/04/2019 FINAL  Final         Radiology Studies: No results  found.      Scheduled Meds: . chlorhexidine gluconate (MEDLINE KIT)  15 mL Mouth Rinse BID  . Chlorhexidine Gluconate Cloth  6 each Topical Daily  . mouth rinse  15 mL Mouth Rinse 10 times per day  . sodium chloride flush  3 mL Intravenous Q12H   Continuous Infusions: . sodium chloride    . morphine 5 mg/hr (10/05/19 0046)  .  sodium bicarbonate infusion 1/4 NS 1000 mL 50 mL/hr at 10/04/19 1800     LOS: 9 days     Bonnielee Haff, MD [facility]   If 7PM-7AM, please contact night-coverage [email or web address] Password [facility] 10/05/2019, 8:19 AM

## 2019-10-05 DEATH — deceased

## 2019-10-06 ENCOUNTER — Ambulatory Visit: Payer: Medicare Other | Admitting: Infectious Disease

## 2019-10-06 LAB — GLUCOSE, CAPILLARY: Glucose-Capillary: 299 mg/dL — ABNORMAL HIGH (ref 70–99)

## 2019-10-06 MED FILL — Medication: Qty: 1 | Status: AC

## 2019-10-07 NOTE — Progress Notes (Signed)
Pt had a new bag of morphine that was just hang and pt expired right after so the bag didn't get to run, but it was spiked. Therefore, 100 ml of morphine was measured and wasted in medication disposal bottle witnessed by Yetta Glassman, RN. Pt has been washed up, placed in a body bag, and taken to the morgue.

## 2019-10-08 NOTE — Progress Notes (Signed)
Internal Medicine Clinic Attending  Case discussed with Dr. Melvin  at the time of the visit.  We reviewed the resident's history and exam and pertinent patient test results.  I agree with the assessment, diagnosis, and plan of care documented in the resident's note.  

## 2019-10-09 LAB — CYTOLOGY - NON PAP

## 2019-10-13 ENCOUNTER — Other Ambulatory Visit: Payer: Self-pay | Admitting: Internal Medicine

## 2019-10-29 LAB — FUNGUS CULTURE WITH STAIN

## 2019-10-29 LAB — FUNGUS CULTURE RESULT

## 2019-10-29 LAB — FUNGAL ORGANISM REFLEX

## 2019-10-30 LAB — FUNGUS CULTURE WITH STAIN

## 2019-10-30 LAB — FUNGUS CULTURE RESULT

## 2019-10-30 LAB — FUNGAL ORGANISM REFLEX

## 2019-11-04 NOTE — Progress Notes (Signed)
PROGRESS NOTE  RHETTA CLEEK MVH:[id number] DOB: [birth year] DOA: 09/27/2019 PCP: Earlene Plater, MD   LOS: 10 days   Brief Narrative / Interim history: 73 year old BF PMHx  renal transplantation, chronic kidney disease stage IV, HIV on antiretrovirals, chronic hepatitis C, and DM 2 who was hospitalized at [facility] 10/9 >10/16 for Covid gastroenteritis and pneumonitis complicated by acute kidney injury. She was referred back to the ED 10/22 by her PCP with complaints of weakness and lethargy. In the ED she was found to have worsening infiltrates on a CXR and was admitted to [facility].  She developed numerous acute issues as discussed below.  Poor prognosis discussed with family.  Transition to comfort care.  She was extubated.  Subjective / 24h Interval events: Unresponsive.  Agonal breathing.  Assessment & Plan: Active Problems:   Encounter for intubation   COVID-19   Pneumonia due to COVID-19 virus   Altered mental status   Acute respiratory failure with hypoxemia (HCC)   Acute encephalopathy   Shock circulatory (HCC)   Protein-calorie malnutrition, severe   Cardiogenic shock ([place])   Principal Problem Acute hypoxic respiratory failure due to COVID-19 pneumonia -patient was initially admitted to the ICU and intubated.  Critical care consulted and followed.  Despite aggressive intervention she clinically continues to decline, and after discussions between the critical care/hospitalist team with the family she was transitioned to comfort care and extubated on 10/31, care was transitioned towards comfort and she is currently on a morphine infusion.  Active Problems Acute metabolic encephalopathy-likely due to Covid encephalopathy is no other etiology has been found, MRI on 10/23 was unremarkable, EEG was without acute findings, and patient underwent a lumbar puncture on 10/28 without any WBCs, glucose 148, and cultures without growth. Acute right upper  extremity DVT-initially was on IV heparin but due to significant drop in hemoglobin this was held Acute right upper extremity compartment syndrome-unable to palpate radial/ulnar pulse in the right upper extremity, although still dopplerable.  Now on comfort Anemia secondary to questionable blood loss-no overt bleeding was noted, unclear.  FOBT was positive but no melanotic stools or hematochezia has been noted.  She was transfused 2 units of packed red blood cells Thrombocytopenia-unclear reason, heparin was discontinued and HIT panel was negative Cardiogenic shock-now comfort Type 2 diabetes mellitus, uncontrolled, with complications Status post renal transplant Acute kidney injury on chronic kidney disease stage IV / NAGMA / hypernatremia / hypokalemia Abnormal TFTs HIV GERD Debility Severe protein calorie malnutrition  Scheduled Meds: . chlorhexidine gluconate (MEDLINE KIT)  15 mL Mouth Rinse BID  . Chlorhexidine Gluconate Cloth  6 each Topical Daily  . mouth rinse  15 mL Mouth Rinse 10 times per day  . sodium chloride flush  3 mL Intravenous Q12H   Continuous Infusions: . sodium chloride    . morphine 5 mg/hr (10/05/19 2038)   PRN Meds:.sodium chloride, acetaminophen **OR** acetaminophen, diphenhydrAMINE, fentaNYL (SUBLIMAZE) injection, glycopyrrolate **OR** glycopyrrolate **OR** glycopyrrolate, midazolam, midazolam, morphine injection, morphine, polyvinyl alcohol, sodium chloride flush  Code Status: DNR Disposition Plan: anticipate in hospital death  Consultants:  PCCM  Objective: Vitals:   10/05/19 1000 10/05/19 1100 10/05/19 1400 10/05/19 2005  BP: (!) 78/45 (!) 93/20 (!) 85/39 (!) 97/27  Pulse: 62 60 65   Resp: (!) 8 (!) 9 12   Temp:    98.7 F (37.1 C)  TempSrc:    Axillary  SpO2: (!) 88% (!) 87% (!) 85% (!) 87%  Weight:  Height:        Intake/Output Summary (Last 24 hours) at October 11, 2019 1312 Last data filed at 2019-10-11 9169 Gross per 24 hour  Intake 60  ml  Output 1 ml  Net 59 ml   Filed Weights   10/02/19 0500 10/03/19 0424 10/04/19 0551  Weight: 68.9 kg 73.8 kg 76.4 kg    Examination:  Constitutional: Appears comfortable, agonal breathing  Data Reviewed: I have independently reviewed following labs and imaging studies   CBC: Recent Labs  Lab 10/01/19 0500  10/01/19 0914 10/01/19 2010 10/02/19 0323 10/02/19 0501 10/03/19 0256 10/03/19 0512 10/04/19 0447  WBC 16.8*  --  19.5*  --   --  16.2*  --  9.9 7.7  NEUTROABS 14.6*  --  15.7*  --   --  13.5*  --  8.1* 6.4  HGB 8.2*   < > 8.3*  --  5.8* 6.4* 7.8* 8.3* 8.3*  HCT 25.0*   < > 24.9*  --  17.0* 20.0* 23.0* 24.9* 25.9*  MCV 91.2  --  91.2  --   --  93.0  --  88.3 89.9  PLT 127*  --  134* 96*  --  102*  --  81* 79*   < > = values in this interval not displayed.   Basic Metabolic Panel: Recent Labs  Lab 10/01/19 0500  10/01/19 0914 10/01/19 2010 10/02/19 0323 10/02/19 0501  10/03/19 0010 10/03/19 0256 10/03/19 0512 10/03/19 1355 10/04/19 0447  NA 145   < > 144 143 143 140   < > 143 142 141 143 143  K 5.2*   < > 5.1 4.0 4.5 4.3  --   --  3.9 3.8  --  3.9  CL 117*  --  118* 123*  --  115*  --   --   --  111  --  112*  CO2 17*  --  17* 13*  --  17*  --   --   --  18*  --  20*  GLUCOSE 279*  --  278* 329*  --  362*  --   --   --  212*  --  227*  BUN 56*  --  62* 53*  --  68*  --   --   --  74*  --  80*  CREATININE 3.85*  --  4.38* 3.86*  --  4.93*  --   --   --  5.14*  --  5.15*  CALCIUM 7.9*  --  8.0* 6.2*  --  7.7*  --   --   --  7.8*  --  8.2*  MG 2.1  --  2.1  --   --  2.1  --   --   --  2.1  --  2.1  PHOS 4.4  --  4.4  --   --  4.7*  --   --   --  4.1  --  4.5   < > = values in this interval not displayed.   GFR: Estimated Creatinine Clearance: 9.9 mL/min (A) (by C-G formula based on SCr of 5.15 mg/dL (H)). Liver Function Tests: Recent Labs  Lab 10/01/19 0500 10/01/19 0914 10/01/19 2010 10/02/19 0501 10/03/19 0512 10/04/19 0447  AST 26 22  --  _0 ALT 16 18  --  _1 ALKPHOS 58 54  --  66 74 76  BILITOT 0.4 0.5  --  0.4 0.9 0.6  PROT  4.9* 4.8*  --  4.3* 4.7* 4.8*  ALBUMIN 2.0* 2.0* 1.5* 1.8* 1.9* 1.9*   No results for input(s): LIPASE, AMYLASE in the last 168 hours. No results for input(s): AMMONIA in the last 168 hours. Coagulation Profile: Recent Labs  Lab 10/01/19 0500 10/01/19 2010 10/02/19 0501 10/03/19 0512 10/04/19 0447  INR 1.1 1.3* 1.2 1.0 1.1   Cardiac Enzymes: No results for input(s): CKTOTAL, CKMB, CKMBINDEX, TROPONINI in the last 168 hours. BNP (last 3 results) No results for input(s): PROBNP in the last 8760 hours. HbA1C: No results for input(s): HGBA1C in the last 72 hours. CBG: Recent Labs  Lab 10/04/19 0045 10/04/19 0430 10/04/19 0759 10/04/19 1357 10/05/19 0727  GLUCAP 299* 227* 167* 104* 166*   Lipid Profile: No results for input(s): CHOL, HDL, LDLCALC, TRIG, CHOLHDL, LDLDIRECT in the last 72 hours. Thyroid Function Tests: No results for input(s): TSH, T4TOTAL, FREET4, T3FREE, THYROIDAB in the last 72 hours. Anemia Panel: No results for input(s): VITAMINB12, FOLATE, FERRITIN, TIBC, IRON, RETICCTPCT in the last 72 hours. Urine analysis:    Component Value Date/Time   COLORURINE YELLOW 09/28/2019 0100   APPEARANCEUR CLEAR 09/28/2019 0100   LABSPEC 1.015 09/28/2019 0100   PHURINE 5.0 09/28/2019 0100   GLUCOSEU >=500 (A) 09/28/2019 0100   HGBUR SMALL (A) 09/28/2019 0100   BILIRUBINUR NEGATIVE 09/28/2019 0100   KETONESUR 5 (A) 09/28/2019 0100   PROTEINUR >=300 (A) 09/28/2019 0100   UROBILINOGEN 0.2 03/02/2014 1441   NITRITE NEGATIVE 09/28/2019 0100   LEUKOCYTESUR NEGATIVE 09/28/2019 0100   Sepsis Labs: Invalid input(s): PROCALCITONIN, LACTICIDVEN  Recent Results (from the past 240 hour(s))  Culture, respiratory (non-expectorated)     Status: None   Collection Time: 09/27/19 12:40 PM   Specimen: Bronchoalveolar Lavage; Respiratory  Result Value Ref Range Status    Specimen Description   Final    BRONCHIAL ALVEOLAR LAVAGE Performed at [facility] [address], [place], [place] [address]    Special Requests   Final    Immunocompromised Performed at [facility], [place] [address], [place], [place] [address]    Gram Stain   Final    FEW WBC PRESENT,BOTH PMN AND MONONUCLEAR RARE GRAM NEGATIVE RODS    Culture   Final    Consistent with normal respiratory flora. Performed at [facility], [place] [address] [place], [place] [address]    Report Status 09/30/2019 FINAL  Final  Fungus Culture With Stain     Status: None (Preliminary result)   Collection Time: 09/27/19 12:41 PM  Result Value Ref Range Status   Fungus Stain Final report  Final    Comment: (NOTE) Performed At: [facility] [place], [place] [id number] Rush Farmer MD [id number]    Fungus (Mycology) Culture PENDING  Incomplete  Aspergillus Ag, BAL/Serum     Status: None   Collection Time: 09/27/19 12:41 PM   Specimen: Bronchoalveolar Lavage; Respiratory  Result Value Ref Range Status   Aspergillus Ag, BAL/Serum 0.05 0.00 - 0.49 Index Final    Comment: (NOTE) Performed At: [facility] [place], [place] [id number] Rush Farmer MD [id number] Performed At: [facility] [address] [place], [place] [id number] Katina Degree MDPhD [id number]   Acid Fast Smear (AFB)     Status: None   Collection Time: 09/27/19 12:41 PM   Specimen: Bronchoalveolar Lavage; Respiratory  Result Value Ref Range Status   AFB Specimen Processing Concentration  Final   Acid Fast  Smear Negative  Final    Comment: (NOTE) Performed At: [facility] [place], [place] [id number] Rush Farmer MD [id number]   Pneumocystis smear by DFA     Status: None   Collection Time: 09/27/19 12:41 PM   Specimen: Bronchoalveolar Lavage; Respiratory  Result Value Ref Range Status   Pneumocystis  jiroveci Ag NEGATIVE  Final    Comment: Performed at [facility] Performed at [facility], [place] [address], [place], [place] [address]   Fungus Culture Result     Status: None   Collection Time: 09/27/19 12:41 PM  Result Value Ref Range Status   Result 1 Comment  Final    Comment: (NOTE) KOH/Calcofluor preparation:  no fungus observed. Performed At: [facility] [place], [place] [id number] Rush Farmer MD [id number]   Fungus Culture With Stain     Status: None (Preliminary result)   Collection Time: 10/01/19  2:08 PM   Specimen: Lumbar Puncture  Result Value Ref Range Status   Fungus Stain Final report  Final    Comment: (NOTE) Performed At: [facility] [place], [place] [id number] Rush Farmer MD [id number]    Fungus (Mycology) Culture PENDING  Incomplete   Fungal Source CSF  Final    Comment: Performed at [facility], [place] [address], [place], [place] [address]  Fungus Culture Result     Status: None   Collection Time: 10/01/19  2:08 PM  Result Value Ref Range Status   Result 1 Comment  Final    Comment: (NOTE) KOH/Calcofluor preparation:  no fungus observed. Performed At: [facility] [place], [place] [id number] Rush Farmer MD [id number]   CSF culture with Stat gram stain     Status: None   Collection Time: 10/01/19  2:09 PM   Specimen: CSF; Cerebrospinal Fluid  Result Value Ref Range Status   Specimen Description   Final    CSF Performed at [facility] [address], [place], [place] [address]    Special Requests   Final    NONE Performed at [facility], [place] [address] [place], [place] [address]    Gram Stain   Final    RARE WBC PRESENT,BOTH PMN AND MONONUCLEAR NO ORGANISMS SEEN CYTOSPIN SMEAR Gram Stain Report Called to,Read Back By and Verified With: L.SPARKS AT 1804 ON 10/01/19 BY  N.[email or web address] Performed at [facility], [place] [address], [place], [place] [address]    Culture   Final    NO GROWTH Performed at [facility], [place] [address], [place],  [address]    Report Status 10/04/2019 FINAL  Final      Radiology Studies: No results found.   Marzetta Board, MD, PhD [facility]  Contact via  [email or web address]  [place] P: [phone] F: [phone]

## 2019-11-04 NOTE — Progress Notes (Signed)
Nutrition Brief Note  Chart reviewed. Pt now transitioning to comfort care.  No further nutrition interventions warranted at this time.  Please re-consult as needed.    Cree Napoli, RD, LDN, CNSC Pager [phone] After Hours Pager [phone]    

## 2019-11-04 NOTE — Death Summary Note (Signed)
Death Summary  [name] DGL:[id number] DOB: Dec 20, 1945 DOA: 2019/09/28  PCP: Earlene Plater, MD  Admit date: 09-28-2019 Date of Death: 10/10/19 Time of Death: 23:20 Notification: Earlene Plater, MD notified of death of 10-10-19   History of present illness:  [name] is a 73 y.o. 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 Patient was discharged home 10/16 last week from [facility] for COVID-19 viral gastroenteritis, AKI on CKD, and pneumonitis with acute hypoxic respiratory failure.  She was treated with a course of Rocephin, azithromycin as well as IV steroids and remdesivir.  Daughter at bedside reports that patient initially improved some since returning home.  However patient continued to have generalized weakness and unable to communicate more than a sentence each time which is unlike her baseline. Increase somnolent.  Also continues have decreased appetite but able to stay hydrated.  Then last night she began to have nausea and vomiting and daughter also noticed more labored breathing. ED Course: She was afebrile and hypertensive at times up to 518A systolic.  CBC showed no leukocytosis and stable hemoglobin of 9.9.  CMP showed elevated sodium of 149, glucose of 346, creatinine of 3.05 from 4.606 days ago.  Mildly elevated AST of 14.  Lactic acid of 1.2.  CT head negative for acute finding.  Chest x-ray showed worsening of multifocal pneumonia in the setting of known Covid infection.  Final Diagnoses:  Principal Problem Acute hypoxic respiratory failure due to COVID-19 pneumonia -patient was initially admitted to the ICU and intubated.  Critical care consulted  and followed.  Despite aggressive intervention she clinically continued to decline, and after discussions between the critical care/hospitalist team with the family she was transitioned to comfort care and extubated on Oct 07, 2023. Patient passed away on 10/09/19 at 23:20  Active Problems Acute metabolic encephalopathy-likely due to Covid encephalopathy is no other etiology has been found, MRI on 10/23 was unremarkable, EEG was without acute findings, and patient underwent a lumbar puncture on 10/28 without any WBCs, glucose 148, and cultures without growth. Acute right upper extremity DVT-initially was on IV heparin but due to significant drop in hemoglobin this was held Acute right upper extremity compartment syndrome Anemia secondary to questionable blood loss Thrombocytopenia Cardiogenic shock Type 2 diabetes mellitus, uncontrolled, with complications Status post renal transplant Acute kidney injury on chronic kidney disease stage IV / NAGMA / hypernatremia / hypokalemia Abnormal TFTs HIV GERD Debility Severe protein calorie malnutrition   The results of significant diagnostics from this hospitalization (including imaging, microbiology, ancillary and laboratory) are listed below for reference.    Significant Diagnostic Studies: Ct Abdomen Pelvis Wo Contrast  Result Date: 10/01/2019 CLINICAL DATA:  Unexplained anemia. Unexplained hypotension. COVID-19. EXAM: CT ABDOMEN AND PELVIS WITHOUT CONTRAST TECHNIQUE: Multidetector CT imaging of the abdomen and pelvis was performed following the standard protocol without IV contrast. COMPARISON:  CT scan of the chest dated 09/26/2019 FINDINGS: Lower chest: There are small bilateral pleural effusions with patchy peripheral infiltrates in both lungs, unchanged. Heart is at the upper limits of normal in size. Aortic atherosclerosis. Hepatobiliary: No focal liver abnormality is seen. Status post cholecystectomy. No biliary dilatation. Pancreas:  Unremarkable. No pancreatic ductal dilatation or surrounding inflammatory changes. Spleen: Normal in size without focal abnormality. Adrenals/Urinary Tract:  Adrenal glands are normal. Bilateral severe renal atrophy consistent with the patient's history of renal failure. Renal transplant in the right iliac fossa. No hydronephrosis. Bladder is normal. Stomach/Bowel: Stomach is within normal limits. Appendix appears normal. No evidence of bowel wall thickening, distention, or inflammatory changes. Vascular/Lymphatic: Extensive aortic atherosclerosis. No adenopathy. Reproductive: Status post hysterectomy. No adnexal masses. Other: No ascites, retroperitoneal hemorrhage, or other significant abnormality. Musculoskeletal: No acute or significant osseous findings. IMPRESSION: 1. No acute abnormalities of the abdomen or pelvis. Specifically, no evidence of hemorrhage in the abdomen or pelvis. 2. Small bilateral pleural effusions with patchy peripheral infiltrates in both lungs, unchanged. 3. Aortic atherosclerosis. Aortic Atherosclerosis (ICD10-I70.0). Electronically Signed   By: Lorriane Shire M.D.   On: 10/01/2019 12:59   Dg Abd 1 View  Result Date: 09/28/2019 CLINICAL DATA:  NG tube placement EXAM: ABDOMEN - 1 VIEW COMPARISON:  Abdominal radiograph 09/27/2019, chest radiograph 09/27/2019 FINDINGS: Interval repositioning of the patient's transesophageal tube. The tube now crosses over midline twice and the tip makes a sweep inferiorly likely within the third portion of the duodenum. Numerous surgical staples are seen in the right upper quadrant. Extensive vascular calcium is noted in the abdomen. There is mild gaseous distention of the bowel without a high-grade obstructive bowel gas pattern. Osseous structures are unchanged from prior. Patchy airspace disease noted in the lung bases. IMPRESSION: 1. Interval repositioning of the patient's transesophageal tube. Suspect the tip is within the third portion of the  duodenum. 2. Mild gaseous distention of the bowel without a high-grade obstructive bowel gas pattern. 3. Airspace disease in the lower lungs. Electronically Signed   By: Lovena Le M.D.   On: 09/28/2019 16:42   Dg Abd 1 View  Result Date: 09/27/2019 CLINICAL DATA:  Orogastric tube placement EXAM: ABDOMEN - 1 VIEW COMPARISON:  None. FINDINGS: An enteric tube terminates in the stomach. There is a nonobstructive bowel gas pattern. IMPRESSION: Enteric tube terminates in the stomach. Electronically Signed   By: Zerita Boers M.D.   On: 09/27/2019 12:56   Ct Head Wo Contrast  Result Date: 10/01/2019 CLINICAL DATA:  Altered level of consciousness. EXAM: CT HEAD WITHOUT CONTRAST TECHNIQUE: Contiguous axial images were obtained from the base of the skull through the vertex without intravenous contrast. COMPARISON:  September 25, 2019. FINDINGS: Brain: No evidence of acute infarction, hemorrhage, hydrocephalus, extra-axial collection or mass lesion/mass effect. Vascular: No hyperdense vessel or unexpected calcification. Skull: Normal. Negative for fracture or focal lesion. Sinuses/Orbits: No acute finding. Other: None. IMPRESSION: Normal head CT. Electronically Signed   By: Marijo Conception M.D.   On: 10/01/2019 12:55   Ct Head Wo Contrast  Result Date: 09/23/2019 CLINICAL DATA:  Head trauma worsening mental status EXAM: CT HEAD WITHOUT CONTRAST TECHNIQUE: Contiguous axial images were obtained from the base of the skull through the vertex without intravenous contrast. COMPARISON:  MRI 01/29/2017 FINDINGS: Brain: No acute territorial infarction, hemorrhage or intracranial mass. The ventricles are nonenlarged. Vascular: No hyperdense vessels.  Carotid vascular calcification Skull: Normal. Negative for fracture or focal lesion. Sinuses/Orbits: Mucosal thickening in the ethmoid sinuses Other: None IMPRESSION: Negative non contrasted CT appearance of the brain Electronically Signed   By: Donavan Foil M.D.   On:  09/27/2019 22:29   Ct Chest Wo Contrast  Result Date: 09/26/2019 CLINICAL DATA:  Pneumonia. EXAM: CT CHEST WITHOUT CONTRAST TECHNIQUE: Multidetector CT imaging of the chest was performed following the standard protocol without IV contrast. COMPARISON:  September 25, 2019. FINDINGS:  Cardiovascular: Atherosclerosis of thoracic aorta is noted without aneurysm formation. Normal cardiac size. No pericardial effusion. Mediastinum/Nodes: Small sliding-type hiatal hernia is noted. No definite adenopathy is noted, although imaging is limited due to the lack of intravenous contrast. Mild bilateral thyroid gland enlargement is noted which is somewhat heterogeneous. Lungs/Pleura: No pneumothorax is noted. Small bilateral pleural effusions are noted. Multifocal airspace opacities are noted throughout both lungs, most prominently in the peripheral regions, most consistent with multifocal pneumonia, potentially of atypical or viral etiology. Upper Abdomen: Bilateral renal atrophy is noted consistent with history of end-stage renal disease. Musculoskeletal: No chest wall mass or suspicious bone lesions identified. IMPRESSION: Multiple airspace opacities are noted bilaterally and most prominently in the peripheral regions, most consistent with multifocal pneumonia, most likely of atypical or viral etiology. Small bilateral pleural effusions are noted. Small sliding-type hiatal hernia. Aortic Atherosclerosis (ICD10-I70.0). Electronically Signed   By: Marijo Conception M.D.   On: 09/26/2019 15:41   Mr Brain Wo Contrast  Result Date: 09/26/2019 CLINICAL DATA:  Initial evaluation for acute encephalopathy. EXAM: MRI HEAD WITHOUT CONTRAST TECHNIQUE: Multiplanar, multiecho pulse sequences of the brain and surrounding structures were obtained without intravenous contrast. COMPARISON:  Prior head CT from 09/28/2019. FINDINGS: Brain: Examination moderately to severely degraded by motion artifact. Generalized age appropriate cerebral  atrophy. No focal parenchymal signal abnormality or significant cerebral white matter changes seen on this motion degraded exam. No abnormal foci of restricted diffusion to suggest acute or subacute ischemia. Gray-white matter differentiation maintained. No encephalomalacia to suggest chronic cortical infarction. No foci of susceptibility artifact to suggest acute or chronic intracranial hemorrhage. No mass lesion, midline shift or mass effect. No hydrocephalus. No extra-axial fluid collection. Vascular: Major intracranial vascular flow voids maintained. Skull and upper cervical spine: Craniocervical junction grossly within normal limits. Bone marrow signal intensity normal. No scalp soft tissue abnormality. Sinuses/Orbits: Globes and orbital soft tissues within normal limits. Paranasal sinuses are largely clear. No significant mastoid effusion. Other: None. IMPRESSION: 1. Technically limited exam due to extensive motion artifact. 2. Grossly normal brain MRI. No acute intracranial abnormality identified. Electronically Signed   By: Jeannine Boga M.D.   On: 09/26/2019 23:22   US Renal  Result Date: 09/17/2019 CLINICAL DATA:  Acute kidney injury EXAM: RENAL / URINARY TRACT ULTRASOUND COMPLETE COMPARISON:  None. FINDINGS: Right Kidney: Renal measurements: 9.2 x 4.7 x 4.6 cm = volume: 104 mL. Markedly increased echotexture and cortical thinning. 1.2 cm cyst in the midpole. No hydronephrosis. Left Kidney: Renal measurements: 9.1 x 4.8 x 4.7 cm = volume: 108 mL. Markedly increased echotexture and cortical thinning. No mass or hydronephrosis. Bladder: Appears normal for degree of bladder distention. Incidentally noted is ascites in the lower abdomen/pelvis. IMPRESSION: Markedly increased echotexture within the kidneys with cortical thinning compatible with chronic medical renal disease. No hydronephrosis. Ascites. Electronically Signed   By: Rolm Baptise M.D.   On: 09/17/2019 12:05   Dg Chest Port 1  View  Result Date: 10/03/2019 CLINICAL DATA:  Intubation. EXAM: PORTABLE CHEST 1 VIEW COMPARISON:  10/02/2019. FINDINGS: Endotracheal tube, feeding tube in stable position. Left subclavian line appears to be at the cavoatrial junction on today's exam. Heart size stable. Diffuse bilateral pulmonary infiltrates again noted. No interim change. No prominent pleural effusion. No pneumothorax. Surgical clips right upper quadrant. IMPRESSION: 1. Endotracheal tube, feeding tube in stable position. Left subclavian line appears to be at the cavoatrial junction on today's exam. 2. Diffuse bilateral pulmonary infiltrates again noted. No interim change.  Electronically Signed   By: Marcello Moores  Register   On: 10/03/2019 07:12   Dg Chest Port 1 View  Result Date: 10/02/2019 CLINICAL DATA:  Intubation, COVID-19 EXAM: PORTABLE CHEST 1 VIEW COMPARISON:  Portable exam 0549 hours compared to 10/01/2019 FINDINGS: Tip of endotracheal tube projects 12 mm above carina. Tip of feeding tube projects over duodenal bulb/pyloric region. Tip of LEFT subclavian line projects over RIGHT atrium, recommend withdrawal 2 cm to place tip at the cavoatrial junction. Stable heart size and mediastinal contours. Patchy airspace infiltrates consistent with pneumonia, minimally increased. No pleural effusion or pneumothorax. IMPRESSION: Slightly increased pulmonary infiltrates. Recommend withdrawal of LEFT subclavian line 2 cm as above. Electronically Signed   By: Lavonia Dana M.D.   On: 10/02/2019 08:42   Dg Chest Port 1 View  Result Date: 10/01/2019 CLINICAL DATA:  Endotracheal tube placement. EXAM: PORTABLE CHEST 1 VIEW COMPARISON:  September 30, 2019. FINDINGS: Stable cardiomediastinal silhouette. Endotracheal and nasogastric tubes are unchanged in position. Left subclavian catheter is unchanged. No pneumothorax is noted. Stable bilateral lung opacities are noted consistent with pneumonia. Bony thorax is unremarkable. IMPRESSION: Stable support  apparatus. Stable bilateral lung opacities are noted consistent with multifocal pneumonia. Electronically Signed   By: Marijo Conception M.D.   On: 10/01/2019 07:46   Dg Chest Port 1 View  Result Date: 09/30/2019 CLINICAL DATA:  Coronavirus infection. Ventilator support. EXAM: PORTABLE CHEST 1 VIEW COMPARISON:  09/27/2019 FINDINGS: Endotracheal tube tip is 3 cm above the carina. Orogastric or nasogastric tube enters the stomach. Left subclavian central line tip is in the right atrium. This is newly placed. No pneumothorax. Patchy bilateral pulmonary infiltrates consistent with viral pneumonia appear similar. No worsening or new finding. IMPRESSION: 1. Newly placed left subclavian central line with tip in the right atrium. No pneumothorax. 2. No change in patchy bilateral pulmonary infiltrates consistent with viral pneumonia. Electronically Signed   By: Nelson Chimes M.D.   On: 09/30/2019 16:42   Dg Chest Port 1 View  Result Date: 09/27/2019 CLINICAL DATA:  Endotracheal tube placement EXAM: PORTABLE CHEST 1 VIEW COMPARISON:  Chest radiograph dated 10/02/2019 and CT chest dated 09/26/2019 FINDINGS: There has been interval placement of an endotracheal tube which terminates 3.5 cm from the carina. An enteric tube enters the stomach and terminates below the field of view. The cardiac and mediastinal contours are not significantly changed. Bilateral peripheral predominant airspace opacities are not significantly changed. Small bilateral pleural effusions likely contribute. There is no pneumothorax. IMPRESSION: 1. Endotracheal tube appears satisfactorily positioned. 2. No significant interval change in the bilateral peripheral predominant airspace opacities. Small bilateral pleural effusions likely contribute. Electronically Signed   By: Zerita Boers M.D.   On: 09/27/2019 12:50   Dg Chest Portable 1 View  Result Date: 09/04/2019 CLINICAL DATA:  Recent change in mental status. Known COVID positive. EXAM:  PORTABLE CHEST 1 VIEW COMPARISON:  09/12/2019 FINDINGS: Worsening of bilateral opacities with multifocal areas of nodular opacity and peripheral changes. Also with left lower lobe consolidation since prior study. Cardiomediastinal contours are enlarged. No acute bone process. IMPRESSION: Worsening of multifocal pneumonia in the setting of known COVID infection. No other interval changes. Electronically Signed   By: Zetta Bills M.D.   On: 09/24/2019 15:10   Dg Chest Portable 1 View  Result Date: 09/12/2019 CLINICAL DATA:  Cough and fever. EXAM: PORTABLE CHEST 1 VIEW COMPARISON:  06/17/2018 FINDINGS: There are patchy faint bilateral peripheral infiltrates in both mid zones and at both lung  bases worrisome for viral pneumonia. Heart size and pulmonary vascularity are normal. No effusions. No significant bone abnormality. IMPRESSION: Patchy bilateral peripheral infiltrates in the mid zones and at both lung bases worrisome for viral pneumonia. No other significant abnormalities. Electronically Signed   By: Lorriane Shire M.D.   On: 09/12/2019 18:16   Vas [place] Upper Extremity Arterial Duplex  Result Date: 10/02/2019 UPPER EXTREMITY DUPLEX STUDY Indication Swollen arm, difficulty finding pulses, rule out ischemia.  Other Factors: Covid positive, Brachial DVT found 09/30/19 Limitations: Ventilator, patient unable to hold arm in position. Comparison Study: No prior study on file for comparsion Performing Technologist: Sharion Dove RVS  Examination Guidelines: A complete evaluation includes B-mode imaging, spectral Doppler, color Doppler, and power Doppler as needed of all accessible portions of each vessel. Bilateral testing is considered an integral part of a complete examination. Limited examinations for reoccurring indications may be performed as noted.  Right Doppler Findings: +-----------+----------+---------+------+--------+  Site        PSV (cm/s) Waveform  Plaque Comments   +-----------+----------+---------+------+--------+  Subclavian  126        triphasic                  +-----------+----------+---------+------+--------+  Axillary    139        triphasic                  +-----------+----------+---------+------+--------+  Brachial    221        triphasic                  +-----------+----------+---------+------+--------+  Radial      168        triphasic                  +-----------+----------+---------+------+--------+  Ulnar       155        triphasic                  +-----------+----------+---------+------+--------+  Palmar Arch 166        triphasic                  +-----------+----------+---------+------+--------+  Summary:  Right: No obstruction visualized in the right upper extremity Right        brachial DVT found 09/30/19 is still present. *See table(s) above for measurements and observations. Electronically signed by Servando Snare MD on 10/02/2019 at 4:25:28 PM.    Final    Vas [place] Upper Extremity Venous Duplex  Result Date: 09/30/2019 UPPER VENOUS STUDY  Indications: Covid positive, and Swelling Limitations: Patient confusion, positioning. Comparison Study: No prior study on file for comparison Performing Technologist: Sharion Dove RVS  Examination Guidelines: A complete evaluation includes B-mode imaging, spectral Doppler, color Doppler, and power Doppler as needed of all accessible portions of each vessel. Bilateral testing is considered an integral part of a complete examination. Limited examinations for reoccurring indications may be performed as noted.  Right Findings: +----------+------------+---------+-----------+----------+---------------------+  RIGHT      Compressible Phasicity Spontaneous Properties        Summary         +----------+------------+---------+-----------+----------+---------------------+  IJV                                                         Not visualized       +----------+------------+---------+-----------+----------+---------------------+  Subclavian                 Yes        Yes                 patent by color and                                                                    Doppler         +----------+------------+---------+-----------+----------+---------------------+  Axillary       Full        Yes        Yes                                       +----------+------------+---------+-----------+----------+---------------------+  Brachial       None        No         No                         Acute          +----------+------------+---------+-----------+----------+---------------------+  Radial                     Yes        Yes                  patent by Doppler    +----------+------------+---------+-----------+----------+---------------------+  Ulnar                                                       Not visualized      +----------+------------+---------+-----------+----------+---------------------+  Cephalic       None        No         No                         Acute          +----------+------------+---------+-----------+----------+---------------------+  Basilic                                                     Not visualized      +----------+------------+---------+-----------+----------+---------------------+  Summary:  Right: Findings consistent with acute deep vein thrombosis involving the right brachial veins. Findings consistent with acute superficial vein thrombosis involving the right cephalic vein.  *See table(s) above for measurements and observations.  Diagnosing physician: Deitra Mayo MD Electronically signed by Deitra Mayo MD on 09/30/2019 at 4:57:10 PM.    Final     Microbiology: Recent Results (from the past 240 hour(s))  Culture, respiratory (non-expectorated)     Status: None   Collection Time: 09/27/19 12:40 PM   Specimen: Bronchoalveolar Lavage; Respiratory  Result Value Ref Range Status   Specimen  Description   Final    BRONCHIAL  ALVEOLAR LAVAGE Performed at [facility], [place] [address] [place], [place] [address]    Special Requests   Final    Immunocompromised Performed at [facility], [place] [address] [place], [place] [address]    Gram Stain   Final    FEW WBC PRESENT,BOTH PMN AND MONONUCLEAR RARE GRAM NEGATIVE RODS    Culture   Final    Consistent with normal respiratory flora. Performed at [facility], [place] [address], [place], [place] [address]    Report Status 09/30/2019 FINAL  Final  Fungus Culture With Stain     Status: None (Preliminary result)   Collection Time: 09/27/19 12:41 PM  Result Value Ref Range Status   Fungus Stain Final report  Final    Comment: (NOTE) Performed At: [facility] [place], [place] [id number] Rush Farmer MD [id number]    Fungus (Mycology) Culture PENDING  Incomplete  Aspergillus Ag, BAL/Serum     Status: None   Collection Time: 09/27/19 12:41 PM   Specimen: Bronchoalveolar Lavage; Respiratory  Result Value Ref Range Status   Aspergillus Ag, BAL/Serum 0.05 0.00 - 0.49 Index Final    Comment: (NOTE) Performed At: [facility] [place], [place] [id number] Rush Farmer MD [id number] Performed At: [facility] [address] [place], [place] [id number] Katina Degree MDPhD [id number]   Acid Fast Smear (AFB)     Status: None   Collection Time: 09/27/19 12:41 PM   Specimen: Bronchoalveolar Lavage; Respiratory  Result Value Ref Range Status   AFB Specimen Processing Concentration  Final   Acid Fast Smear Negative  Final    Comment: (NOTE) Performed At: [facility] [place], [place] [id number] Rush Farmer MD [id number]   Pneumocystis smear by DFA     Status: None   Collection Time: 09/27/19 12:41 PM   Specimen: Bronchoalveolar Lavage; Respiratory  Result Value Ref Range Status   Pneumocystis jiroveci  Ag NEGATIVE  Final    Comment: Performed at [facility] Performed at [facility], [place] [address], [place], [place] [address]   Fungus Culture Result     Status: None   Collection Time: 09/27/19 12:41 PM  Result Value Ref Range Status   Result 1 Comment  Final    Comment: (NOTE) KOH/Calcofluor preparation:  no fungus observed. Performed At: [facility] [place], [place] [id number] Rush Farmer MD [id number]   Fungus Culture With Stain     Status: None (Preliminary result)   Collection Time: 10/01/19  2:08 PM   Specimen: Lumbar Puncture  Result Value Ref Range Status   Fungus Stain Final report  Final    Comment: (NOTE) Performed At: [facility] [place], [place] [id number] Rush Farmer MD [id number]    Fungus (Mycology) Culture PENDING  Incomplete   Fungal Source CSF  Final    Comment: Performed at [facility], [place] [address], [place], [place] [address]  Fungus Culture Result     Status: None   Collection Time: 10/01/19  2:08 PM  Result Value Ref Range Status   Result 1 Comment  Final    Comment: (NOTE) KOH/Calcofluor preparation:  no fungus observed. Performed At: [facility], [place] [id number] Rush Farmer MD YF:[id number]   CSF culture with Stat gram stain     Status: None   Collection Time: 10/01/19  2:09 PM   Specimen: CSF;  Cerebrospinal Fluid  Result Value Ref Range Status   Specimen Description   Final    CSF Performed at [facility] [address] [place], [place] [address]    Special Requests   Final    NONE Performed at [facility], [place] [address], [place], [place] [address]    Gram Stain   Final    RARE WBC PRESENT,BOTH PMN AND MONONUCLEAR NO ORGANISMS SEEN CYTOSPIN SMEAR Gram Stain Report Called to,Read Back By and Verified With: L.SPARKS AT 1804 ON 10/01/19 BY  N.THOMPSON Performed at [facility], [place] [address], [place], [place] [address]    Culture   Final    NO GROWTH Performed at [facility], [place] [address] [place], [place] [address]    Report Status 10/04/2019 FINAL  Final     Labs: Basic Metabolic Panel: Recent Labs  Lab 10/01/19 0500  10/01/19 0914 10/01/19 2010  10/02/19 0501  10/03/19 0010 10/03/19 0256 10/03/19 0512 10/03/19 1355 10/04/19 0447  NA 145   < > 144 143   < > 140   < > 143 142 141 143 143  K 5.2*   < > 5.1 4.0   < > 4.3  --   --  3.9 3.8  --  3.9  CL 117*  --  118* 123*  --  115*  --   --   --  111  --  112*  CO2 17*  --  17* 13*  --  17*  --   --   --  18*  --  20*  GLUCOSE 279*  --  278* 329*  --  362*  --   --   --  212*  --  227*  BUN 56*  --  62* 53*  --  68*  --   --   --  74*  --  80*  CREATININE 3.85*  --  4.38* 3.86*  --  4.93*  --   --   --  5.14*  --  5.15*  CALCIUM 7.9*  --  8.0* 6.2*  --  7.7*  --   --   --  7.8*  --  8.2*  MG 2.1  --  2.1  --   --  2.1  --   --   --  2.1  --  2.1  PHOS 4.4  --  4.4  --   --  4.7*  --   --   --  4.1  --  4.5   < > = values in this interval not displayed.   Liver Function Tests: Recent Labs  Lab 10/01/19 0500 10/01/19 0914 10/01/19 2010 10/02/19 0501 10/03/19 0512 10/04/19 0447  AST 26 22  --  _0 ALT 16 18  --  _1 ALKPHOS 58 54  --  66 74 76  BILITOT 0.4 0.5  --  0.4 0.9 0.6  PROT 4.9* 4.8*  --  4.3* 4.7* 4.8*  ALBUMIN 2.0* 2.0* 1.5* 1.8* 1.9* 1.9*   No results for input(s): LIPASE, AMYLASE in the last 168 hours. No results for input(s): AMMONIA in the last 168 hours. CBC: Recent Labs  Lab 10/01/19 0500  10/01/19 0914 10/01/19 2010 10/02/19 0323 10/02/19 0501 10/03/19 0256 10/03/19 0512 10/04/19 0447  WBC 16.8*  --  19.5*  --   --  16.2*  --  9.9 7.7  NEUTROABS 14.6*  --  15.7*  --   --  13.5*  --  8.1* 6.4  HGB 8.2*   < > 8.3*  --  5.8* 6.4* 7.8* 8.3* 8.3*  HCT 25.0*   < > 24.9*  --  17.0* 20.0*  23.0* 24.9* 25.9*  MCV 91.2  --  91.2  --   --  93.0  --  88.3 89.9  PLT 127*  --  134* 96*  --  102*  --  81* 79*   < > = values in this interval not displayed.   Cardiac Enzymes: No results for input(s): CKTOTAL, CKMB, CKMBINDEX, TROPONINI in the last 168 hours. D-Dimer No results for input(s): DDIMER in the last 72 hours. BNP: Invalid input(s): POCBNP CBG: Recent Labs  Lab 10/04/19 0045 10/04/19 0430 10/04/19 0759 10/04/19 1357 10/05/19 0727  GLUCAP 299* 227* 167* 104* 166*   Anemia work up No results for input(s): VITAMINB12, FOLATE, FERRITIN, TIBC, IRON, RETICCTPCT in the last 72 hours. Urinalysis    Component Value Date/Time   COLORURINE YELLOW 09/28/2019 0100   APPEARANCEUR CLEAR 09/28/2019 0100   LABSPEC 1.015 09/28/2019 0100   PHURINE 5.0 09/28/2019 0100   GLUCOSEU >=500 (A) 09/28/2019 0100   HGBUR SMALL (A) 09/28/2019 0100   BILIRUBINUR NEGATIVE 09/28/2019 0100   KETONESUR 5 (A) 09/28/2019 0100   PROTEINUR >=300 (A) 09/28/2019 0100   UROBILINOGEN 0.2 03/02/2014 1441   NITRITE NEGATIVE 09/28/2019 0100   LEUKOCYTESUR NEGATIVE 09/28/2019 0100   Sepsis Labs Invalid input(s): PROCALCITONIN,  WBC,  LACTICIDVEN     SIGNED:  Marzetta Board, MD  [facility] 10/13/2019, 6:23 AM Pager   If 7PM-7AM, please contact night-coverage [email or web address] Password [email or web address]

## 2019-11-04 NOTE — Progress Notes (Signed)
Pt passed at 2320. Daughter has been called and notified. She currently doesn't have funeral home information, but she will call us in the morning with it.

## 2019-11-04 DEATH — deceased

## 2019-11-10 ENCOUNTER — Encounter: Payer: Medicare Other | Admitting: Internal Medicine

## 2019-11-11 LAB — ACID FAST CULTURE WITH REFLEXED SENSITIVITIES (MYCOBACTERIA): Acid Fast Culture: NEGATIVE

## 2019-12-29 ENCOUNTER — Ambulatory Visit: Payer: Medicare Other | Admitting: Infectious Disease

## 2020-01-14 ENCOUNTER — Telehealth: Payer: Self-pay | Admitting: *Deleted

## 2020-01-14 NOTE — Telephone Encounter (Signed)
[facility] called about a script written in 09/2019 by dr Rebeca Alert, they will fax it and form needed to [place] records

## 2020-01-21 LAB — DIC (DISSEMINATED INTRAVASCULAR COAGULATION)PANEL
D-Dimer, Quant: 1.38 ug/mL-FEU — ABNORMAL HIGH (ref 0.00–0.50)
Fibrinogen: 239 mg/dL (ref 210–475)
INR: 1.3 — ABNORMAL HIGH (ref 0.8–1.2)
Platelets: 96 10*3/uL — ABNORMAL LOW (ref 150–400)
Prothrombin Time: 16.5 seconds — ABNORMAL HIGH (ref 11.4–15.2)
aPTT: 39 seconds — ABNORMAL HIGH (ref 24–36)

## 2020-02-16 ENCOUNTER — Telehealth: Payer: Self-pay | Admitting: *Deleted

## 2020-02-16 NOTE — Telephone Encounter (Signed)
Rep from [facility] states they have faxed cmn form 3 times to [phone] and have called, each time they were ask to fax to [phone] and have but it has not been returned. Today I gave them [phone] and ask that it be faxed once again.

## 2020-06-26 IMAGING — CR DG CHEST 2V
2 series · 2 of 2 positions shown · non-contrast
Comparison: 08/30/2011

CLINICAL DATA: Central chest pain and pressure for 1 week.

EXAM:
CHEST - 2 VIEW

[chest pa]
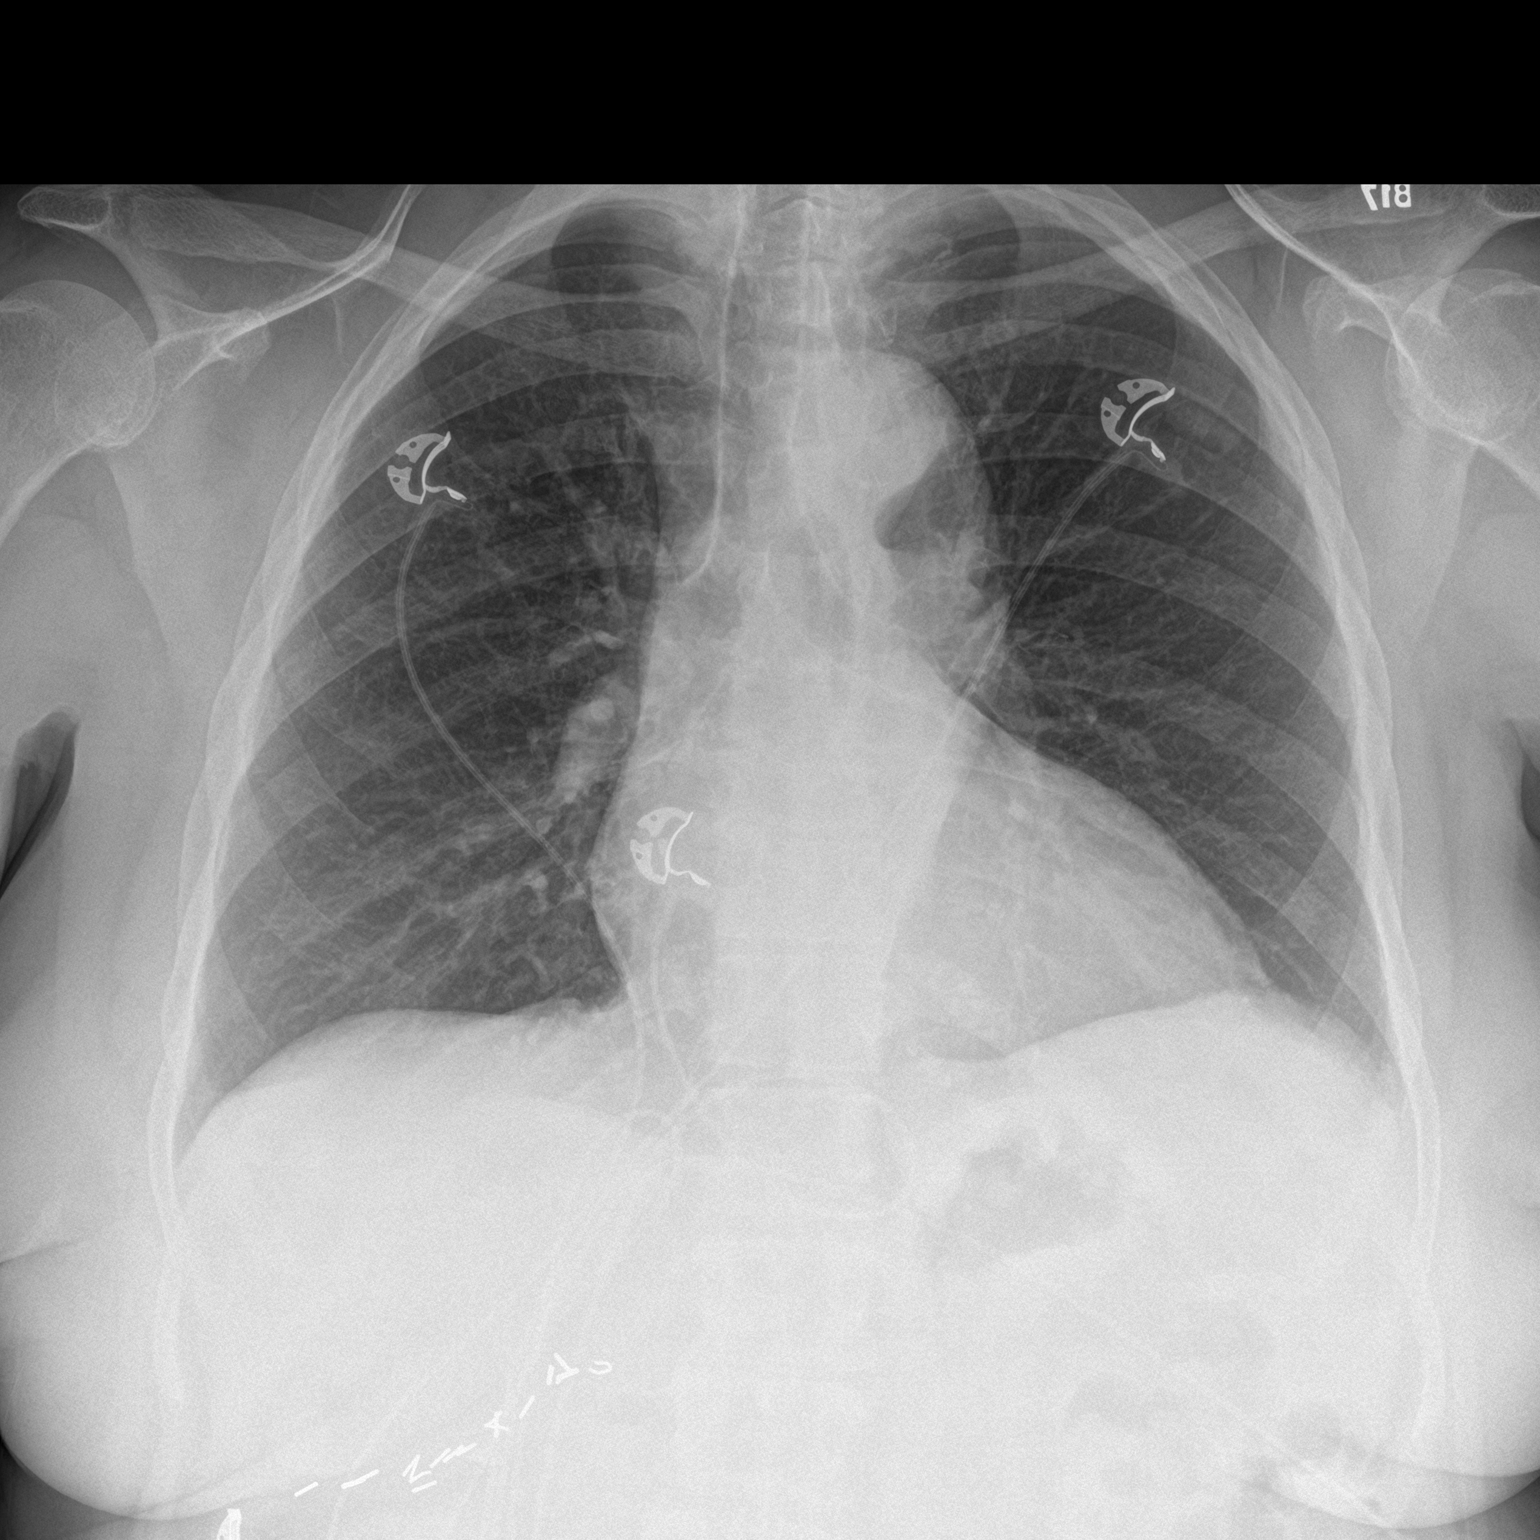

[chest lat]
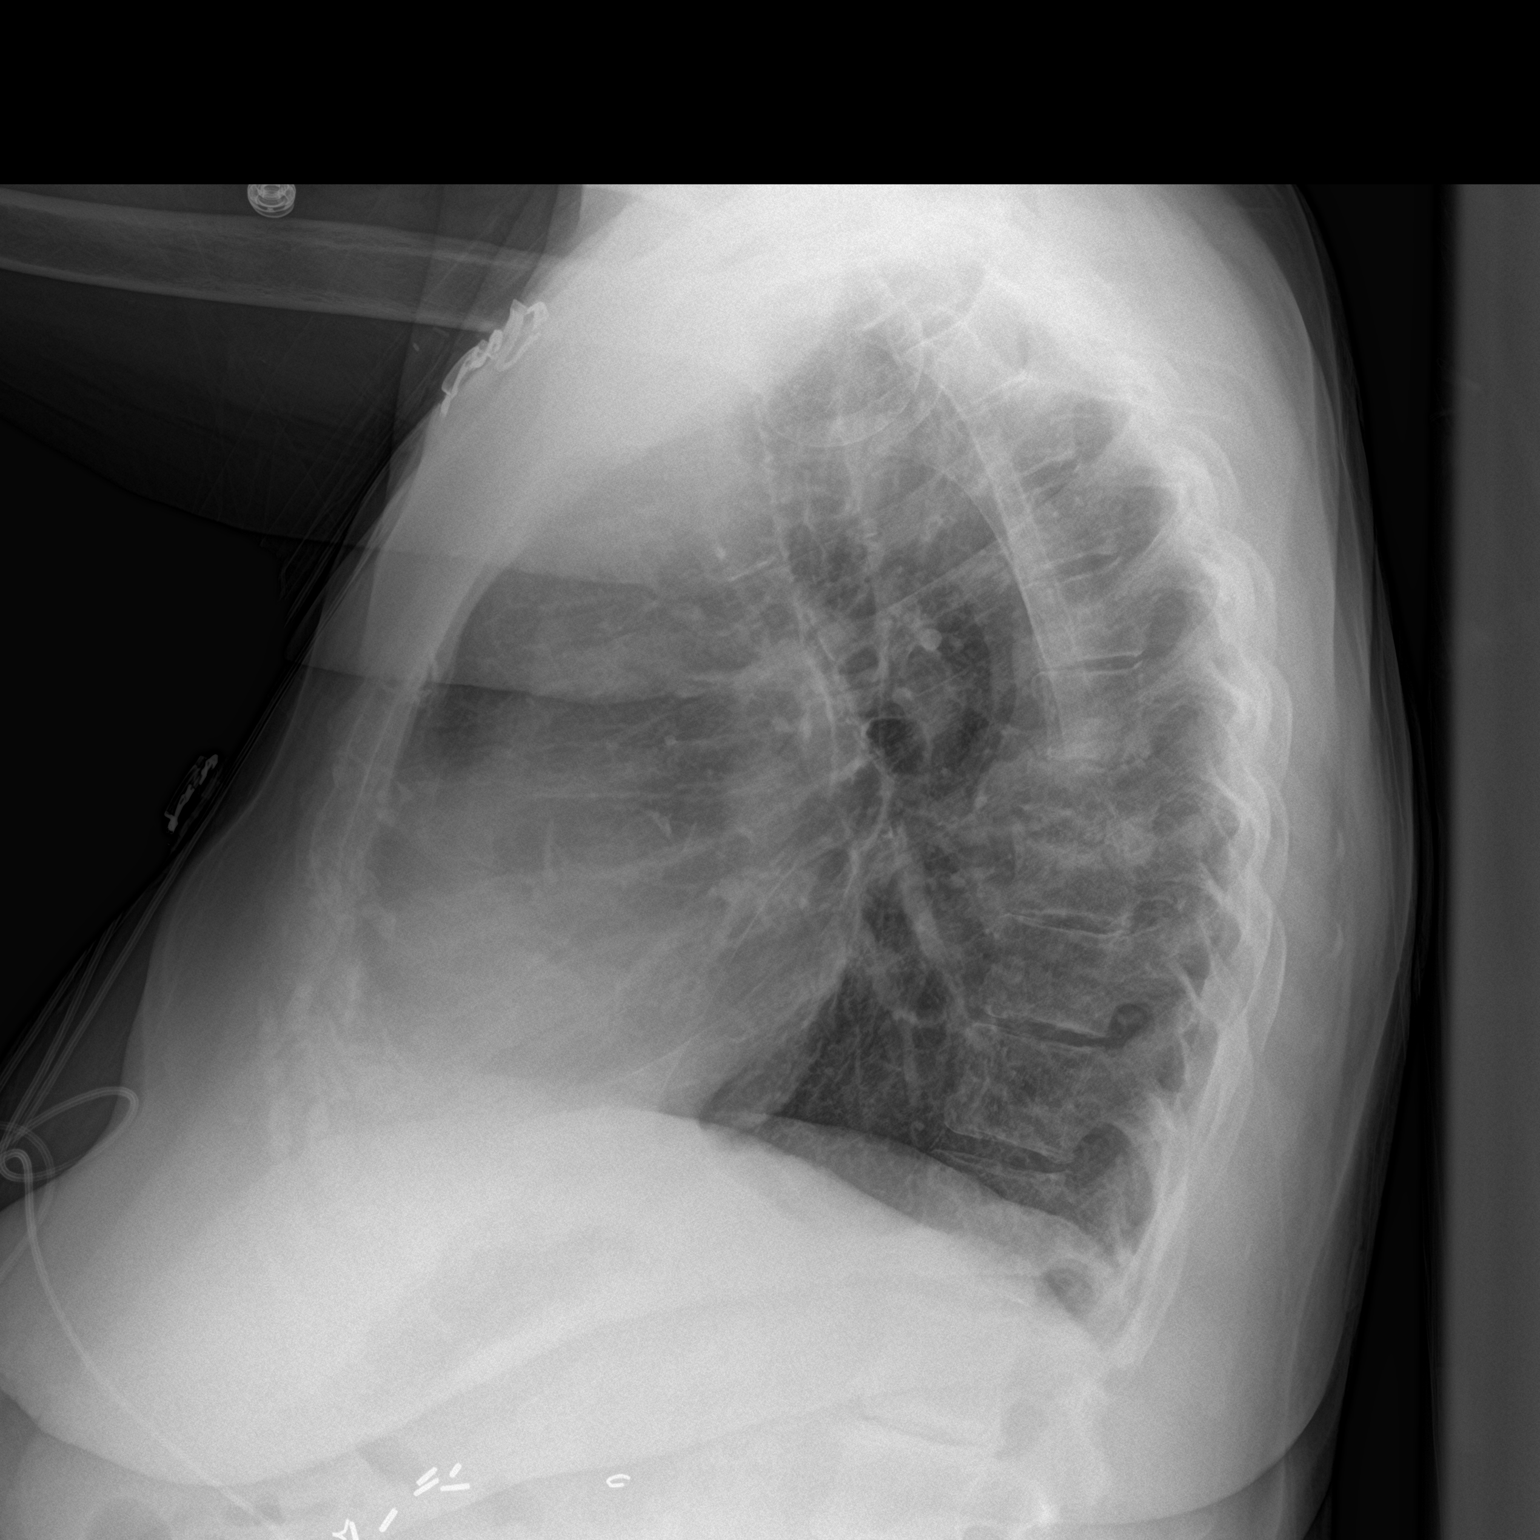

[2 of 2 positions shown; findings below may reference images not displayed]

FINDINGS: Tortuous thoracic aorta with mild atherosclerotic calcification of
the arch. Heart size within normal limits. Prior dialysis catheter
is been removed.

The lungs appear clear.  No blunting of the costophrenic angles.

Chronic wedging of a midthoracic vertebral with irregularity of the
adjacent vertebral endplates common not appreciably changed from
03/21/2011.
IMPRESSION: 1.  No active cardiopulmonary disease is radiographically apparent.
2. Aortic Atherosclerosis ([id number]).  Tortuous thoracic aorta.
3. Chronic wedging of a midthoracic vertebra with endplate
irregularity, no change from 0660.
